# Patient Record
Sex: Male | Born: 1940 | Race: White | Hispanic: No | Marital: Married | Smoking: Former smoker
Health system: Southern US, Community
[De-identification: ages and names within clinical notes are randomized; demographics above are authoritative.]

## PROBLEM LIST (undated history)

## (undated) DIAGNOSIS — I25810 Atherosclerosis of coronary artery bypass graft(s) without angina pectoris: Secondary | ICD-10-CM

## (undated) DIAGNOSIS — I219 Acute myocardial infarction, unspecified: Secondary | ICD-10-CM

## (undated) DIAGNOSIS — I10 Essential (primary) hypertension: Secondary | ICD-10-CM

## (undated) DIAGNOSIS — T7840XA Allergy, unspecified, initial encounter: Secondary | ICD-10-CM

## (undated) DIAGNOSIS — M1990 Unspecified osteoarthritis, unspecified site: Secondary | ICD-10-CM

## (undated) DIAGNOSIS — R0902 Hypoxemia: Secondary | ICD-10-CM

## (undated) DIAGNOSIS — E785 Hyperlipidemia, unspecified: Secondary | ICD-10-CM

## (undated) DIAGNOSIS — J439 Emphysema, unspecified: Secondary | ICD-10-CM

## (undated) DIAGNOSIS — E119 Type 2 diabetes mellitus without complications: Secondary | ICD-10-CM

## (undated) DIAGNOSIS — J449 Chronic obstructive pulmonary disease, unspecified: Secondary | ICD-10-CM

## (undated) DIAGNOSIS — K219 Gastro-esophageal reflux disease without esophagitis: Secondary | ICD-10-CM

## (undated) DIAGNOSIS — Z933 Colostomy status: Secondary | ICD-10-CM

## (undated) DIAGNOSIS — J45909 Unspecified asthma, uncomplicated: Secondary | ICD-10-CM

## (undated) HISTORY — PX: COLOSTOMY CLOSURE: SHX1381

## (undated) HISTORY — DX: Unspecified asthma, uncomplicated: J45.909

## (undated) HISTORY — DX: Emphysema, unspecified: J43.9

## (undated) HISTORY — DX: Hypoxemia: R09.02

## (undated) HISTORY — DX: Hyperlipidemia, unspecified: E78.5

## (undated) HISTORY — DX: Type 2 diabetes mellitus without complications: E11.9

## (undated) HISTORY — DX: Atherosclerosis of coronary artery bypass graft(s) without angina pectoris: I25.810

## (undated) HISTORY — DX: Essential (primary) hypertension: I10

## (undated) HISTORY — PX: KNEE SURGERY: SHX244

## (undated) HISTORY — DX: Unspecified osteoarthritis, unspecified site: M19.90

## (undated) HISTORY — DX: Gastro-esophageal reflux disease without esophagitis: K21.9

## (undated) HISTORY — DX: Allergy, unspecified, initial encounter: T78.40XA

## (undated) HISTORY — PX: HERNIA REPAIR: SHX51

## (undated) HISTORY — PX: OTHER SURGICAL HISTORY: SHX169

## (undated) HISTORY — PX: NOSE SURGERY: SHX723

## (undated) HISTORY — PX: BACK SURGERY: SHX140

## (undated) HISTORY — PX: COLOSTOMY: SHX63

---

## 2011-07-06 DIAGNOSIS — M7989 Other specified soft tissue disorders: Secondary | ICD-10-CM | POA: Diagnosis not present

## 2011-07-06 DIAGNOSIS — Z87891 Personal history of nicotine dependence: Secondary | ICD-10-CM | POA: Diagnosis not present

## 2011-07-06 DIAGNOSIS — E119 Type 2 diabetes mellitus without complications: Secondary | ICD-10-CM | POA: Diagnosis not present

## 2011-07-06 DIAGNOSIS — M25519 Pain in unspecified shoulder: Secondary | ICD-10-CM | POA: Diagnosis not present

## 2011-07-06 DIAGNOSIS — I10 Essential (primary) hypertension: Secondary | ICD-10-CM | POA: Diagnosis not present

## 2011-07-06 DIAGNOSIS — M79609 Pain in unspecified limb: Secondary | ICD-10-CM | POA: Diagnosis not present

## 2011-07-06 DIAGNOSIS — Z9861 Coronary angioplasty status: Secondary | ICD-10-CM | POA: Diagnosis not present

## 2011-07-06 DIAGNOSIS — B029 Zoster without complications: Secondary | ICD-10-CM | POA: Diagnosis not present

## 2011-07-06 DIAGNOSIS — I252 Old myocardial infarction: Secondary | ICD-10-CM | POA: Diagnosis not present

## 2011-07-06 DIAGNOSIS — J449 Chronic obstructive pulmonary disease, unspecified: Secondary | ICD-10-CM | POA: Diagnosis not present

## 2011-07-06 DIAGNOSIS — M129 Arthropathy, unspecified: Secondary | ICD-10-CM | POA: Diagnosis not present

## 2011-07-06 DIAGNOSIS — F329 Major depressive disorder, single episode, unspecified: Secondary | ICD-10-CM | POA: Diagnosis not present

## 2011-07-06 DIAGNOSIS — K219 Gastro-esophageal reflux disease without esophagitis: Secondary | ICD-10-CM | POA: Diagnosis not present

## 2011-07-06 DIAGNOSIS — G8929 Other chronic pain: Secondary | ICD-10-CM | POA: Diagnosis not present

## 2011-07-08 DIAGNOSIS — M719 Bursopathy, unspecified: Secondary | ICD-10-CM | POA: Diagnosis not present

## 2011-07-08 DIAGNOSIS — M67919 Unspecified disorder of synovium and tendon, unspecified shoulder: Secondary | ICD-10-CM | POA: Diagnosis not present

## 2011-07-08 DIAGNOSIS — M654 Radial styloid tenosynovitis [de Quervain]: Secondary | ICD-10-CM | POA: Diagnosis not present

## 2011-07-08 DIAGNOSIS — M1710 Unilateral primary osteoarthritis, unspecified knee: Secondary | ICD-10-CM | POA: Diagnosis not present

## 2011-07-13 DIAGNOSIS — G8929 Other chronic pain: Secondary | ICD-10-CM | POA: Diagnosis not present

## 2011-07-13 DIAGNOSIS — M25569 Pain in unspecified knee: Secondary | ICD-10-CM | POA: Diagnosis not present

## 2011-07-14 DIAGNOSIS — IMO0001 Reserved for inherently not codable concepts without codable children: Secondary | ICD-10-CM | POA: Diagnosis not present

## 2011-07-14 DIAGNOSIS — M545 Low back pain: Secondary | ICD-10-CM | POA: Diagnosis not present

## 2011-07-14 DIAGNOSIS — G894 Chronic pain syndrome: Secondary | ICD-10-CM | POA: Diagnosis not present

## 2011-07-14 DIAGNOSIS — IMO0002 Reserved for concepts with insufficient information to code with codable children: Secondary | ICD-10-CM | POA: Diagnosis not present

## 2011-07-17 DIAGNOSIS — J069 Acute upper respiratory infection, unspecified: Secondary | ICD-10-CM | POA: Diagnosis not present

## 2011-07-17 DIAGNOSIS — E119 Type 2 diabetes mellitus without complications: Secondary | ICD-10-CM | POA: Diagnosis not present

## 2011-07-17 DIAGNOSIS — K219 Gastro-esophageal reflux disease without esophagitis: Secondary | ICD-10-CM | POA: Diagnosis not present

## 2011-07-17 DIAGNOSIS — Z9861 Coronary angioplasty status: Secondary | ICD-10-CM | POA: Diagnosis not present

## 2011-07-17 DIAGNOSIS — R0602 Shortness of breath: Secondary | ICD-10-CM | POA: Diagnosis not present

## 2011-07-17 DIAGNOSIS — I252 Old myocardial infarction: Secondary | ICD-10-CM | POA: Diagnosis not present

## 2011-07-17 DIAGNOSIS — G8929 Other chronic pain: Secondary | ICD-10-CM | POA: Diagnosis not present

## 2011-07-17 DIAGNOSIS — J449 Chronic obstructive pulmonary disease, unspecified: Secondary | ICD-10-CM | POA: Diagnosis not present

## 2011-07-17 DIAGNOSIS — Z87891 Personal history of nicotine dependence: Secondary | ICD-10-CM | POA: Diagnosis not present

## 2011-07-17 DIAGNOSIS — R42 Dizziness and giddiness: Secondary | ICD-10-CM | POA: Diagnosis not present

## 2011-07-17 DIAGNOSIS — I491 Atrial premature depolarization: Secondary | ICD-10-CM | POA: Diagnosis not present

## 2011-07-17 DIAGNOSIS — R5381 Other malaise: Secondary | ICD-10-CM | POA: Diagnosis not present

## 2011-07-17 DIAGNOSIS — I10 Essential (primary) hypertension: Secondary | ICD-10-CM | POA: Diagnosis not present

## 2011-07-17 DIAGNOSIS — R9431 Abnormal electrocardiogram [ECG] [EKG]: Secondary | ICD-10-CM | POA: Diagnosis not present

## 2011-07-22 DIAGNOSIS — M1710 Unilateral primary osteoarthritis, unspecified knee: Secondary | ICD-10-CM | POA: Diagnosis not present

## 2011-07-24 DIAGNOSIS — J384 Edema of larynx: Secondary | ICD-10-CM | POA: Diagnosis not present

## 2011-07-24 DIAGNOSIS — H6120 Impacted cerumen, unspecified ear: Secondary | ICD-10-CM | POA: Diagnosis not present

## 2011-07-29 DIAGNOSIS — G5620 Lesion of ulnar nerve, unspecified upper limb: Secondary | ICD-10-CM | POA: Diagnosis not present

## 2011-08-03 DIAGNOSIS — G8929 Other chronic pain: Secondary | ICD-10-CM | POA: Diagnosis not present

## 2011-08-03 DIAGNOSIS — R9431 Abnormal electrocardiogram [ECG] [EKG]: Secondary | ICD-10-CM | POA: Diagnosis not present

## 2011-08-03 DIAGNOSIS — K219 Gastro-esophageal reflux disease without esophagitis: Secondary | ICD-10-CM | POA: Diagnosis not present

## 2011-08-03 DIAGNOSIS — E119 Type 2 diabetes mellitus without complications: Secondary | ICD-10-CM | POA: Diagnosis not present

## 2011-08-03 DIAGNOSIS — Z9861 Coronary angioplasty status: Secondary | ICD-10-CM | POA: Diagnosis not present

## 2011-08-03 DIAGNOSIS — I10 Essential (primary) hypertension: Secondary | ICD-10-CM | POA: Diagnosis not present

## 2011-08-03 DIAGNOSIS — J441 Chronic obstructive pulmonary disease with (acute) exacerbation: Secondary | ICD-10-CM | POA: Diagnosis not present

## 2011-08-03 DIAGNOSIS — Z794 Long term (current) use of insulin: Secondary | ICD-10-CM | POA: Diagnosis not present

## 2011-08-03 DIAGNOSIS — F329 Major depressive disorder, single episode, unspecified: Secondary | ICD-10-CM | POA: Diagnosis not present

## 2011-08-03 DIAGNOSIS — J449 Chronic obstructive pulmonary disease, unspecified: Secondary | ICD-10-CM | POA: Diagnosis not present

## 2011-08-03 DIAGNOSIS — Z87891 Personal history of nicotine dependence: Secondary | ICD-10-CM | POA: Diagnosis not present

## 2011-08-03 DIAGNOSIS — J45901 Unspecified asthma with (acute) exacerbation: Secondary | ICD-10-CM | POA: Diagnosis not present

## 2011-08-03 DIAGNOSIS — I4460 Unspecified fascicular block: Secondary | ICD-10-CM | POA: Diagnosis not present

## 2011-08-03 DIAGNOSIS — I252 Old myocardial infarction: Secondary | ICD-10-CM | POA: Diagnosis not present

## 2011-08-03 DIAGNOSIS — I491 Atrial premature depolarization: Secondary | ICD-10-CM | POA: Diagnosis not present

## 2011-08-03 DIAGNOSIS — R0989 Other specified symptoms and signs involving the circulatory and respiratory systems: Secondary | ICD-10-CM | POA: Diagnosis not present

## 2011-08-03 DIAGNOSIS — R42 Dizziness and giddiness: Secondary | ICD-10-CM | POA: Diagnosis not present

## 2011-08-03 DIAGNOSIS — R0602 Shortness of breath: Secondary | ICD-10-CM | POA: Diagnosis not present

## 2011-08-05 DIAGNOSIS — G5620 Lesion of ulnar nerve, unspecified upper limb: Secondary | ICD-10-CM | POA: Diagnosis not present

## 2011-08-09 DIAGNOSIS — I252 Old myocardial infarction: Secondary | ICD-10-CM | POA: Diagnosis not present

## 2011-08-09 DIAGNOSIS — E119 Type 2 diabetes mellitus without complications: Secondary | ICD-10-CM | POA: Diagnosis not present

## 2011-08-09 DIAGNOSIS — G8929 Other chronic pain: Secondary | ICD-10-CM | POA: Diagnosis not present

## 2011-08-09 DIAGNOSIS — L03119 Cellulitis of unspecified part of limb: Secondary | ICD-10-CM | POA: Diagnosis not present

## 2011-08-09 DIAGNOSIS — T8140XA Infection following a procedure, unspecified, initial encounter: Secondary | ICD-10-CM | POA: Diagnosis not present

## 2011-08-09 DIAGNOSIS — IMO0002 Reserved for concepts with insufficient information to code with codable children: Secondary | ICD-10-CM | POA: Diagnosis not present

## 2011-08-09 DIAGNOSIS — G8918 Other acute postprocedural pain: Secondary | ICD-10-CM | POA: Diagnosis not present

## 2011-08-09 DIAGNOSIS — L02519 Cutaneous abscess of unspecified hand: Secondary | ICD-10-CM | POA: Diagnosis not present

## 2011-08-09 DIAGNOSIS — I10 Essential (primary) hypertension: Secondary | ICD-10-CM | POA: Diagnosis not present

## 2011-08-09 DIAGNOSIS — Z9889 Other specified postprocedural states: Secondary | ICD-10-CM | POA: Diagnosis not present

## 2011-08-09 DIAGNOSIS — R0602 Shortness of breath: Secondary | ICD-10-CM | POA: Diagnosis not present

## 2011-08-09 DIAGNOSIS — K219 Gastro-esophageal reflux disease without esophagitis: Secondary | ICD-10-CM | POA: Diagnosis not present

## 2011-08-09 DIAGNOSIS — Z87891 Personal history of nicotine dependence: Secondary | ICD-10-CM | POA: Diagnosis not present

## 2011-08-09 DIAGNOSIS — M79609 Pain in unspecified limb: Secondary | ICD-10-CM | POA: Diagnosis not present

## 2011-08-09 DIAGNOSIS — J449 Chronic obstructive pulmonary disease, unspecified: Secondary | ICD-10-CM | POA: Diagnosis not present

## 2011-08-12 DIAGNOSIS — G894 Chronic pain syndrome: Secondary | ICD-10-CM | POA: Diagnosis not present

## 2011-08-12 DIAGNOSIS — M961 Postlaminectomy syndrome, not elsewhere classified: Secondary | ICD-10-CM | POA: Diagnosis not present

## 2011-08-12 DIAGNOSIS — IMO0002 Reserved for concepts with insufficient information to code with codable children: Secondary | ICD-10-CM | POA: Diagnosis not present

## 2011-08-12 DIAGNOSIS — IMO0001 Reserved for inherently not codable concepts without codable children: Secondary | ICD-10-CM | POA: Diagnosis not present

## 2011-08-13 DIAGNOSIS — J441 Chronic obstructive pulmonary disease with (acute) exacerbation: Secondary | ICD-10-CM | POA: Diagnosis not present

## 2011-08-13 DIAGNOSIS — I4460 Unspecified fascicular block: Secondary | ICD-10-CM | POA: Diagnosis not present

## 2011-08-13 DIAGNOSIS — Z87891 Personal history of nicotine dependence: Secondary | ICD-10-CM | POA: Diagnosis not present

## 2011-08-13 DIAGNOSIS — R9431 Abnormal electrocardiogram [ECG] [EKG]: Secondary | ICD-10-CM | POA: Diagnosis not present

## 2011-08-13 DIAGNOSIS — I498 Other specified cardiac arrhythmias: Secondary | ICD-10-CM | POA: Diagnosis not present

## 2011-08-13 DIAGNOSIS — I491 Atrial premature depolarization: Secondary | ICD-10-CM | POA: Diagnosis not present

## 2011-08-13 DIAGNOSIS — I10 Essential (primary) hypertension: Secondary | ICD-10-CM | POA: Diagnosis not present

## 2011-08-13 DIAGNOSIS — I252 Old myocardial infarction: Secondary | ICD-10-CM | POA: Diagnosis not present

## 2011-08-13 DIAGNOSIS — R0609 Other forms of dyspnea: Secondary | ICD-10-CM | POA: Diagnosis not present

## 2011-08-13 DIAGNOSIS — Z9861 Coronary angioplasty status: Secondary | ICD-10-CM | POA: Diagnosis not present

## 2011-08-13 DIAGNOSIS — E119 Type 2 diabetes mellitus without complications: Secondary | ICD-10-CM | POA: Diagnosis not present

## 2011-08-13 DIAGNOSIS — J45901 Unspecified asthma with (acute) exacerbation: Secondary | ICD-10-CM | POA: Diagnosis not present

## 2011-08-20 DIAGNOSIS — J45901 Unspecified asthma with (acute) exacerbation: Secondary | ICD-10-CM | POA: Diagnosis not present

## 2011-08-20 DIAGNOSIS — E119 Type 2 diabetes mellitus without complications: Secondary | ICD-10-CM | POA: Diagnosis not present

## 2011-08-20 DIAGNOSIS — R0602 Shortness of breath: Secondary | ICD-10-CM | POA: Diagnosis not present

## 2011-08-20 DIAGNOSIS — R9431 Abnormal electrocardiogram [ECG] [EKG]: Secondary | ICD-10-CM | POA: Diagnosis not present

## 2011-08-20 DIAGNOSIS — K219 Gastro-esophageal reflux disease without esophagitis: Secondary | ICD-10-CM | POA: Diagnosis not present

## 2011-08-20 DIAGNOSIS — I252 Old myocardial infarction: Secondary | ICD-10-CM | POA: Diagnosis not present

## 2011-08-20 DIAGNOSIS — I498 Other specified cardiac arrhythmias: Secondary | ICD-10-CM | POA: Diagnosis not present

## 2011-08-20 DIAGNOSIS — I10 Essential (primary) hypertension: Secondary | ICD-10-CM | POA: Diagnosis not present

## 2011-08-20 DIAGNOSIS — R0609 Other forms of dyspnea: Secondary | ICD-10-CM | POA: Diagnosis not present

## 2011-08-20 DIAGNOSIS — Z87891 Personal history of nicotine dependence: Secondary | ICD-10-CM | POA: Diagnosis not present

## 2011-08-20 DIAGNOSIS — Z9861 Coronary angioplasty status: Secondary | ICD-10-CM | POA: Diagnosis not present

## 2011-08-20 DIAGNOSIS — J441 Chronic obstructive pulmonary disease with (acute) exacerbation: Secondary | ICD-10-CM | POA: Diagnosis not present

## 2011-08-31 DIAGNOSIS — I10 Essential (primary) hypertension: Secondary | ICD-10-CM | POA: Diagnosis not present

## 2011-08-31 DIAGNOSIS — I252 Old myocardial infarction: Secondary | ICD-10-CM | POA: Diagnosis not present

## 2011-08-31 DIAGNOSIS — K219 Gastro-esophageal reflux disease without esophagitis: Secondary | ICD-10-CM | POA: Diagnosis not present

## 2011-08-31 DIAGNOSIS — G8929 Other chronic pain: Secondary | ICD-10-CM | POA: Diagnosis not present

## 2011-08-31 DIAGNOSIS — J449 Chronic obstructive pulmonary disease, unspecified: Secondary | ICD-10-CM | POA: Diagnosis not present

## 2011-08-31 DIAGNOSIS — R9431 Abnormal electrocardiogram [ECG] [EKG]: Secondary | ICD-10-CM | POA: Diagnosis not present

## 2011-08-31 DIAGNOSIS — Z9861 Coronary angioplasty status: Secondary | ICD-10-CM | POA: Diagnosis not present

## 2011-08-31 DIAGNOSIS — R42 Dizziness and giddiness: Secondary | ICD-10-CM | POA: Diagnosis not present

## 2011-08-31 DIAGNOSIS — I4949 Other premature depolarization: Secondary | ICD-10-CM | POA: Diagnosis not present

## 2011-08-31 DIAGNOSIS — Z87891 Personal history of nicotine dependence: Secondary | ICD-10-CM | POA: Diagnosis not present

## 2011-08-31 DIAGNOSIS — M549 Dorsalgia, unspecified: Secondary | ICD-10-CM | POA: Diagnosis not present

## 2011-08-31 DIAGNOSIS — E119 Type 2 diabetes mellitus without complications: Secondary | ICD-10-CM | POA: Diagnosis not present

## 2011-09-05 DIAGNOSIS — I10 Essential (primary) hypertension: Secondary | ICD-10-CM | POA: Diagnosis not present

## 2011-09-05 DIAGNOSIS — M25559 Pain in unspecified hip: Secondary | ICD-10-CM | POA: Diagnosis not present

## 2011-09-05 DIAGNOSIS — I252 Old myocardial infarction: Secondary | ICD-10-CM | POA: Diagnosis not present

## 2011-09-05 DIAGNOSIS — R9431 Abnormal electrocardiogram [ECG] [EKG]: Secondary | ICD-10-CM | POA: Diagnosis not present

## 2011-09-05 DIAGNOSIS — Z79899 Other long term (current) drug therapy: Secondary | ICD-10-CM | POA: Diagnosis not present

## 2011-09-05 DIAGNOSIS — Z7982 Long term (current) use of aspirin: Secondary | ICD-10-CM | POA: Diagnosis not present

## 2011-09-05 DIAGNOSIS — M25569 Pain in unspecified knee: Secondary | ICD-10-CM | POA: Diagnosis not present

## 2011-09-05 DIAGNOSIS — J449 Chronic obstructive pulmonary disease, unspecified: Secondary | ICD-10-CM | POA: Diagnosis not present

## 2011-09-05 DIAGNOSIS — R42 Dizziness and giddiness: Secondary | ICD-10-CM | POA: Diagnosis not present

## 2011-09-05 DIAGNOSIS — E119 Type 2 diabetes mellitus without complications: Secondary | ICD-10-CM | POA: Diagnosis not present

## 2011-09-05 DIAGNOSIS — Z9861 Coronary angioplasty status: Secondary | ICD-10-CM | POA: Diagnosis not present

## 2011-09-05 DIAGNOSIS — F329 Major depressive disorder, single episode, unspecified: Secondary | ICD-10-CM | POA: Diagnosis not present

## 2011-09-05 DIAGNOSIS — K219 Gastro-esophageal reflux disease without esophagitis: Secondary | ICD-10-CM | POA: Diagnosis not present

## 2011-09-05 DIAGNOSIS — I4949 Other premature depolarization: Secondary | ICD-10-CM | POA: Diagnosis not present

## 2011-09-05 DIAGNOSIS — I491 Atrial premature depolarization: Secondary | ICD-10-CM | POA: Diagnosis not present

## 2011-09-05 DIAGNOSIS — G8929 Other chronic pain: Secondary | ICD-10-CM | POA: Diagnosis not present

## 2011-09-05 DIAGNOSIS — D649 Anemia, unspecified: Secondary | ICD-10-CM | POA: Diagnosis not present

## 2011-09-05 DIAGNOSIS — Z87891 Personal history of nicotine dependence: Secondary | ICD-10-CM | POA: Diagnosis not present

## 2011-09-06 DIAGNOSIS — E119 Type 2 diabetes mellitus without complications: Secondary | ICD-10-CM | POA: Diagnosis not present

## 2011-09-06 DIAGNOSIS — J449 Chronic obstructive pulmonary disease, unspecified: Secondary | ICD-10-CM | POA: Diagnosis not present

## 2011-09-06 DIAGNOSIS — Z951 Presence of aortocoronary bypass graft: Secondary | ICD-10-CM | POA: Diagnosis not present

## 2011-09-06 DIAGNOSIS — Z87891 Personal history of nicotine dependence: Secondary | ICD-10-CM | POA: Diagnosis not present

## 2011-09-06 DIAGNOSIS — I2510 Atherosclerotic heart disease of native coronary artery without angina pectoris: Secondary | ICD-10-CM | POA: Diagnosis not present

## 2011-09-06 DIAGNOSIS — K5289 Other specified noninfective gastroenteritis and colitis: Secondary | ICD-10-CM | POA: Diagnosis not present

## 2011-09-06 DIAGNOSIS — R404 Transient alteration of awareness: Secondary | ICD-10-CM | POA: Diagnosis not present

## 2011-09-06 DIAGNOSIS — E860 Dehydration: Secondary | ICD-10-CM | POA: Diagnosis not present

## 2011-09-06 DIAGNOSIS — Z9119 Patient's noncompliance with other medical treatment and regimen: Secondary | ICD-10-CM | POA: Diagnosis not present

## 2011-09-06 DIAGNOSIS — M549 Dorsalgia, unspecified: Secondary | ICD-10-CM | POA: Diagnosis not present

## 2011-09-06 DIAGNOSIS — I10 Essential (primary) hypertension: Secondary | ICD-10-CM | POA: Diagnosis not present

## 2011-09-06 DIAGNOSIS — R112 Nausea with vomiting, unspecified: Secondary | ICD-10-CM | POA: Diagnosis not present

## 2011-09-06 DIAGNOSIS — I498 Other specified cardiac arrhythmias: Secondary | ICD-10-CM | POA: Diagnosis not present

## 2011-09-06 DIAGNOSIS — G8929 Other chronic pain: Secondary | ICD-10-CM | POA: Diagnosis not present

## 2011-09-06 DIAGNOSIS — R197 Diarrhea, unspecified: Secondary | ICD-10-CM | POA: Diagnosis not present

## 2011-09-06 DIAGNOSIS — R0602 Shortness of breath: Secondary | ICD-10-CM | POA: Diagnosis not present

## 2011-09-06 DIAGNOSIS — I491 Atrial premature depolarization: Secondary | ICD-10-CM | POA: Diagnosis not present

## 2011-09-06 DIAGNOSIS — D649 Anemia, unspecified: Secondary | ICD-10-CM | POA: Diagnosis not present

## 2011-09-06 DIAGNOSIS — R9431 Abnormal electrocardiogram [ECG] [EKG]: Secondary | ICD-10-CM | POA: Diagnosis not present

## 2011-09-06 DIAGNOSIS — M1990 Unspecified osteoarthritis, unspecified site: Secondary | ICD-10-CM | POA: Diagnosis not present

## 2011-09-07 DIAGNOSIS — I10 Essential (primary) hypertension: Secondary | ICD-10-CM | POA: Diagnosis not present

## 2011-09-07 DIAGNOSIS — A088 Other specified intestinal infections: Secondary | ICD-10-CM | POA: Diagnosis not present

## 2011-09-07 DIAGNOSIS — D518 Other vitamin B12 deficiency anemias: Secondary | ICD-10-CM | POA: Diagnosis not present

## 2011-09-07 DIAGNOSIS — J449 Chronic obstructive pulmonary disease, unspecified: Secondary | ICD-10-CM | POA: Diagnosis not present

## 2011-09-09 DIAGNOSIS — M961 Postlaminectomy syndrome, not elsewhere classified: Secondary | ICD-10-CM | POA: Diagnosis not present

## 2011-09-09 DIAGNOSIS — M25519 Pain in unspecified shoulder: Secondary | ICD-10-CM | POA: Diagnosis not present

## 2011-09-09 DIAGNOSIS — IMO0002 Reserved for concepts with insufficient information to code with codable children: Secondary | ICD-10-CM | POA: Diagnosis not present

## 2011-09-09 DIAGNOSIS — M76899 Other specified enthesopathies of unspecified lower limb, excluding foot: Secondary | ICD-10-CM | POA: Diagnosis not present

## 2011-09-09 DIAGNOSIS — G894 Chronic pain syndrome: Secondary | ICD-10-CM | POA: Diagnosis not present

## 2011-09-16 DIAGNOSIS — M5430 Sciatica, unspecified side: Secondary | ICD-10-CM | POA: Diagnosis not present

## 2011-09-16 DIAGNOSIS — J441 Chronic obstructive pulmonary disease with (acute) exacerbation: Secondary | ICD-10-CM | POA: Diagnosis not present

## 2011-09-16 DIAGNOSIS — R05 Cough: Secondary | ICD-10-CM | POA: Diagnosis not present

## 2011-09-16 DIAGNOSIS — R0602 Shortness of breath: Secondary | ICD-10-CM | POA: Diagnosis not present

## 2011-09-18 DIAGNOSIS — J209 Acute bronchitis, unspecified: Secondary | ICD-10-CM | POA: Diagnosis not present

## 2011-09-18 DIAGNOSIS — M25569 Pain in unspecified knee: Secondary | ICD-10-CM | POA: Diagnosis not present

## 2011-09-18 DIAGNOSIS — J029 Acute pharyngitis, unspecified: Secondary | ICD-10-CM | POA: Diagnosis not present

## 2011-09-18 DIAGNOSIS — M25559 Pain in unspecified hip: Secondary | ICD-10-CM | POA: Diagnosis not present

## 2011-09-21 DIAGNOSIS — R1319 Other dysphagia: Secondary | ICD-10-CM | POA: Diagnosis not present

## 2011-09-21 DIAGNOSIS — D508 Other iron deficiency anemias: Secondary | ICD-10-CM | POA: Diagnosis not present

## 2011-09-23 DIAGNOSIS — M1710 Unilateral primary osteoarthritis, unspecified knee: Secondary | ICD-10-CM | POA: Diagnosis not present

## 2011-09-28 DIAGNOSIS — R0602 Shortness of breath: Secondary | ICD-10-CM | POA: Diagnosis not present

## 2011-09-28 DIAGNOSIS — J189 Pneumonia, unspecified organism: Secondary | ICD-10-CM | POA: Diagnosis not present

## 2011-09-29 DIAGNOSIS — R0602 Shortness of breath: Secondary | ICD-10-CM | POA: Diagnosis not present

## 2011-09-29 DIAGNOSIS — J189 Pneumonia, unspecified organism: Secondary | ICD-10-CM | POA: Diagnosis not present

## 2011-09-30 DIAGNOSIS — R0602 Shortness of breath: Secondary | ICD-10-CM | POA: Diagnosis not present

## 2011-09-30 DIAGNOSIS — J189 Pneumonia, unspecified organism: Secondary | ICD-10-CM | POA: Diagnosis not present

## 2011-10-05 DIAGNOSIS — Z87891 Personal history of nicotine dependence: Secondary | ICD-10-CM | POA: Diagnosis not present

## 2011-10-05 DIAGNOSIS — M549 Dorsalgia, unspecified: Secondary | ICD-10-CM | POA: Diagnosis not present

## 2011-10-05 DIAGNOSIS — G8929 Other chronic pain: Secondary | ICD-10-CM | POA: Diagnosis not present

## 2011-10-05 DIAGNOSIS — R52 Pain, unspecified: Secondary | ICD-10-CM | POA: Diagnosis not present

## 2011-10-05 DIAGNOSIS — M545 Low back pain: Secondary | ICD-10-CM | POA: Diagnosis not present

## 2011-10-05 DIAGNOSIS — M79609 Pain in unspecified limb: Secondary | ICD-10-CM | POA: Diagnosis not present

## 2011-10-07 DIAGNOSIS — G894 Chronic pain syndrome: Secondary | ICD-10-CM | POA: Diagnosis not present

## 2011-10-07 DIAGNOSIS — IMO0002 Reserved for concepts with insufficient information to code with codable children: Secondary | ICD-10-CM | POA: Diagnosis not present

## 2011-10-07 DIAGNOSIS — M25519 Pain in unspecified shoulder: Secondary | ICD-10-CM | POA: Diagnosis not present

## 2011-10-07 DIAGNOSIS — M961 Postlaminectomy syndrome, not elsewhere classified: Secondary | ICD-10-CM | POA: Diagnosis not present

## 2011-10-16 DIAGNOSIS — M542 Cervicalgia: Secondary | ICD-10-CM | POA: Diagnosis not present

## 2011-10-16 DIAGNOSIS — J45909 Unspecified asthma, uncomplicated: Secondary | ICD-10-CM | POA: Diagnosis not present

## 2011-10-16 DIAGNOSIS — I252 Old myocardial infarction: Secondary | ICD-10-CM | POA: Diagnosis not present

## 2011-10-16 DIAGNOSIS — Z794 Long term (current) use of insulin: Secondary | ICD-10-CM | POA: Diagnosis not present

## 2011-10-16 DIAGNOSIS — G8929 Other chronic pain: Secondary | ICD-10-CM | POA: Diagnosis not present

## 2011-10-16 DIAGNOSIS — E119 Type 2 diabetes mellitus without complications: Secondary | ICD-10-CM | POA: Diagnosis not present

## 2011-10-16 DIAGNOSIS — Z9861 Coronary angioplasty status: Secondary | ICD-10-CM | POA: Diagnosis not present

## 2011-10-16 DIAGNOSIS — M25559 Pain in unspecified hip: Secondary | ICD-10-CM | POA: Diagnosis not present

## 2011-10-16 DIAGNOSIS — J441 Chronic obstructive pulmonary disease with (acute) exacerbation: Secondary | ICD-10-CM | POA: Diagnosis not present

## 2011-10-16 DIAGNOSIS — Z7982 Long term (current) use of aspirin: Secondary | ICD-10-CM | POA: Diagnosis not present

## 2011-10-16 DIAGNOSIS — Z87891 Personal history of nicotine dependence: Secondary | ICD-10-CM | POA: Diagnosis not present

## 2011-10-21 DIAGNOSIS — M76899 Other specified enthesopathies of unspecified lower limb, excluding foot: Secondary | ICD-10-CM | POA: Diagnosis not present

## 2011-10-21 DIAGNOSIS — M1710 Unilateral primary osteoarthritis, unspecified knee: Secondary | ICD-10-CM | POA: Diagnosis not present

## 2011-10-26 DIAGNOSIS — R0609 Other forms of dyspnea: Secondary | ICD-10-CM | POA: Diagnosis not present

## 2011-10-26 DIAGNOSIS — R9431 Abnormal electrocardiogram [ECG] [EKG]: Secondary | ICD-10-CM | POA: Diagnosis not present

## 2011-10-26 DIAGNOSIS — I498 Other specified cardiac arrhythmias: Secondary | ICD-10-CM | POA: Diagnosis not present

## 2011-10-26 DIAGNOSIS — R0602 Shortness of breath: Secondary | ICD-10-CM | POA: Diagnosis not present

## 2011-10-26 DIAGNOSIS — I4460 Unspecified fascicular block: Secondary | ICD-10-CM | POA: Diagnosis not present

## 2011-10-26 DIAGNOSIS — J449 Chronic obstructive pulmonary disease, unspecified: Secondary | ICD-10-CM | POA: Diagnosis not present

## 2011-10-26 DIAGNOSIS — J441 Chronic obstructive pulmonary disease with (acute) exacerbation: Secondary | ICD-10-CM | POA: Diagnosis not present

## 2011-10-28 DIAGNOSIS — D72829 Elevated white blood cell count, unspecified: Secondary | ICD-10-CM | POA: Diagnosis not present

## 2011-10-28 DIAGNOSIS — R0602 Shortness of breath: Secondary | ICD-10-CM | POA: Diagnosis not present

## 2011-10-28 DIAGNOSIS — J984 Other disorders of lung: Secondary | ICD-10-CM | POA: Diagnosis not present

## 2011-10-28 DIAGNOSIS — J449 Chronic obstructive pulmonary disease, unspecified: Secondary | ICD-10-CM | POA: Diagnosis not present

## 2011-10-28 DIAGNOSIS — D649 Anemia, unspecified: Secondary | ICD-10-CM | POA: Diagnosis not present

## 2011-10-28 DIAGNOSIS — I259 Chronic ischemic heart disease, unspecified: Secondary | ICD-10-CM | POA: Diagnosis not present

## 2011-10-28 DIAGNOSIS — J189 Pneumonia, unspecified organism: Secondary | ICD-10-CM | POA: Diagnosis not present

## 2011-10-28 DIAGNOSIS — E119 Type 2 diabetes mellitus without complications: Secondary | ICD-10-CM | POA: Diagnosis not present

## 2011-10-29 DIAGNOSIS — I369 Nonrheumatic tricuspid valve disorder, unspecified: Secondary | ICD-10-CM | POA: Diagnosis not present

## 2011-10-29 DIAGNOSIS — D508 Other iron deficiency anemias: Secondary | ICD-10-CM | POA: Diagnosis not present

## 2011-10-29 DIAGNOSIS — E119 Type 2 diabetes mellitus without complications: Secondary | ICD-10-CM | POA: Diagnosis not present

## 2011-10-29 DIAGNOSIS — J449 Chronic obstructive pulmonary disease, unspecified: Secondary | ICD-10-CM | POA: Diagnosis not present

## 2011-10-29 DIAGNOSIS — I059 Rheumatic mitral valve disease, unspecified: Secondary | ICD-10-CM | POA: Diagnosis not present

## 2011-10-29 DIAGNOSIS — I259 Chronic ischemic heart disease, unspecified: Secondary | ICD-10-CM | POA: Diagnosis not present

## 2011-10-29 DIAGNOSIS — J189 Pneumonia, unspecified organism: Secondary | ICD-10-CM | POA: Diagnosis not present

## 2011-10-29 DIAGNOSIS — I359 Nonrheumatic aortic valve disorder, unspecified: Secondary | ICD-10-CM | POA: Diagnosis not present

## 2011-10-29 DIAGNOSIS — K2270 Barrett's esophagus without dysplasia: Secondary | ICD-10-CM | POA: Diagnosis not present

## 2011-10-30 DIAGNOSIS — I259 Chronic ischemic heart disease, unspecified: Secondary | ICD-10-CM | POA: Diagnosis not present

## 2011-10-30 DIAGNOSIS — J449 Chronic obstructive pulmonary disease, unspecified: Secondary | ICD-10-CM | POA: Diagnosis not present

## 2011-10-30 DIAGNOSIS — J189 Pneumonia, unspecified organism: Secondary | ICD-10-CM | POA: Diagnosis not present

## 2011-10-30 DIAGNOSIS — E119 Type 2 diabetes mellitus without complications: Secondary | ICD-10-CM | POA: Diagnosis not present

## 2011-10-30 DIAGNOSIS — D508 Other iron deficiency anemias: Secondary | ICD-10-CM | POA: Diagnosis not present

## 2011-10-31 DIAGNOSIS — J449 Chronic obstructive pulmonary disease, unspecified: Secondary | ICD-10-CM | POA: Diagnosis not present

## 2011-10-31 DIAGNOSIS — I259 Chronic ischemic heart disease, unspecified: Secondary | ICD-10-CM | POA: Diagnosis not present

## 2011-10-31 DIAGNOSIS — J189 Pneumonia, unspecified organism: Secondary | ICD-10-CM | POA: Diagnosis not present

## 2011-10-31 DIAGNOSIS — E119 Type 2 diabetes mellitus without complications: Secondary | ICD-10-CM | POA: Diagnosis not present

## 2011-11-01 DIAGNOSIS — J189 Pneumonia, unspecified organism: Secondary | ICD-10-CM | POA: Diagnosis not present

## 2011-11-01 DIAGNOSIS — E119 Type 2 diabetes mellitus without complications: Secondary | ICD-10-CM | POA: Diagnosis not present

## 2011-11-01 DIAGNOSIS — J449 Chronic obstructive pulmonary disease, unspecified: Secondary | ICD-10-CM | POA: Diagnosis not present

## 2011-11-01 DIAGNOSIS — I259 Chronic ischemic heart disease, unspecified: Secondary | ICD-10-CM | POA: Diagnosis not present

## 2011-11-03 DIAGNOSIS — E119 Type 2 diabetes mellitus without complications: Secondary | ICD-10-CM | POA: Diagnosis not present

## 2011-11-03 DIAGNOSIS — J441 Chronic obstructive pulmonary disease with (acute) exacerbation: Secondary | ICD-10-CM | POA: Diagnosis not present

## 2011-11-03 DIAGNOSIS — B370 Candidal stomatitis: Secondary | ICD-10-CM | POA: Diagnosis not present

## 2011-11-03 DIAGNOSIS — J189 Pneumonia, unspecified organism: Secondary | ICD-10-CM | POA: Diagnosis not present

## 2011-11-04 DIAGNOSIS — J189 Pneumonia, unspecified organism: Secondary | ICD-10-CM | POA: Diagnosis not present

## 2011-11-04 DIAGNOSIS — J449 Chronic obstructive pulmonary disease, unspecified: Secondary | ICD-10-CM | POA: Diagnosis not present

## 2011-11-04 DIAGNOSIS — I259 Chronic ischemic heart disease, unspecified: Secondary | ICD-10-CM | POA: Diagnosis not present

## 2011-11-04 DIAGNOSIS — E7800 Pure hypercholesterolemia, unspecified: Secondary | ICD-10-CM | POA: Diagnosis not present

## 2011-11-05 DIAGNOSIS — E878 Other disorders of electrolyte and fluid balance, not elsewhere classified: Secondary | ICD-10-CM | POA: Diagnosis not present

## 2011-11-05 DIAGNOSIS — E119 Type 2 diabetes mellitus without complications: Secondary | ICD-10-CM | POA: Diagnosis not present

## 2011-11-05 DIAGNOSIS — B370 Candidal stomatitis: Secondary | ICD-10-CM | POA: Diagnosis not present

## 2011-11-05 DIAGNOSIS — J189 Pneumonia, unspecified organism: Secondary | ICD-10-CM | POA: Diagnosis not present

## 2011-11-05 DIAGNOSIS — J441 Chronic obstructive pulmonary disease with (acute) exacerbation: Secondary | ICD-10-CM | POA: Diagnosis not present

## 2011-11-06 DIAGNOSIS — IMO0002 Reserved for concepts with insufficient information to code with codable children: Secondary | ICD-10-CM | POA: Diagnosis not present

## 2011-11-06 DIAGNOSIS — G894 Chronic pain syndrome: Secondary | ICD-10-CM | POA: Diagnosis not present

## 2011-11-06 DIAGNOSIS — M25519 Pain in unspecified shoulder: Secondary | ICD-10-CM | POA: Diagnosis not present

## 2011-11-06 DIAGNOSIS — M961 Postlaminectomy syndrome, not elsewhere classified: Secondary | ICD-10-CM | POA: Diagnosis not present

## 2011-11-09 DIAGNOSIS — J441 Chronic obstructive pulmonary disease with (acute) exacerbation: Secondary | ICD-10-CM | POA: Diagnosis not present

## 2011-11-09 DIAGNOSIS — B370 Candidal stomatitis: Secondary | ICD-10-CM | POA: Diagnosis not present

## 2011-11-09 DIAGNOSIS — E119 Type 2 diabetes mellitus without complications: Secondary | ICD-10-CM | POA: Diagnosis not present

## 2011-11-09 DIAGNOSIS — J189 Pneumonia, unspecified organism: Secondary | ICD-10-CM | POA: Diagnosis not present

## 2011-11-11 DIAGNOSIS — M1710 Unilateral primary osteoarthritis, unspecified knee: Secondary | ICD-10-CM | POA: Diagnosis not present

## 2011-11-13 DIAGNOSIS — J441 Chronic obstructive pulmonary disease with (acute) exacerbation: Secondary | ICD-10-CM | POA: Diagnosis not present

## 2011-11-13 DIAGNOSIS — J189 Pneumonia, unspecified organism: Secondary | ICD-10-CM | POA: Diagnosis not present

## 2011-11-13 DIAGNOSIS — B370 Candidal stomatitis: Secondary | ICD-10-CM | POA: Diagnosis not present

## 2011-11-13 DIAGNOSIS — E119 Type 2 diabetes mellitus without complications: Secondary | ICD-10-CM | POA: Diagnosis not present

## 2011-11-15 DIAGNOSIS — E119 Type 2 diabetes mellitus without complications: Secondary | ICD-10-CM | POA: Diagnosis not present

## 2011-11-15 DIAGNOSIS — Z87891 Personal history of nicotine dependence: Secondary | ICD-10-CM | POA: Diagnosis not present

## 2011-11-15 DIAGNOSIS — M549 Dorsalgia, unspecified: Secondary | ICD-10-CM | POA: Diagnosis not present

## 2011-11-15 DIAGNOSIS — IMO0002 Reserved for concepts with insufficient information to code with codable children: Secondary | ICD-10-CM | POA: Diagnosis not present

## 2011-11-15 DIAGNOSIS — G8929 Other chronic pain: Secondary | ICD-10-CM | POA: Diagnosis not present

## 2011-11-15 DIAGNOSIS — J209 Acute bronchitis, unspecified: Secondary | ICD-10-CM | POA: Diagnosis not present

## 2011-11-15 DIAGNOSIS — Z9861 Coronary angioplasty status: Secondary | ICD-10-CM | POA: Diagnosis not present

## 2011-11-15 DIAGNOSIS — J40 Bronchitis, not specified as acute or chronic: Secondary | ICD-10-CM | POA: Diagnosis not present

## 2011-11-15 DIAGNOSIS — I10 Essential (primary) hypertension: Secondary | ICD-10-CM | POA: Diagnosis not present

## 2011-11-15 DIAGNOSIS — M542 Cervicalgia: Secondary | ICD-10-CM | POA: Diagnosis not present

## 2011-11-15 DIAGNOSIS — R0602 Shortness of breath: Secondary | ICD-10-CM | POA: Diagnosis not present

## 2011-11-15 DIAGNOSIS — Z794 Long term (current) use of insulin: Secondary | ICD-10-CM | POA: Diagnosis not present

## 2011-11-15 DIAGNOSIS — R05 Cough: Secondary | ICD-10-CM | POA: Diagnosis not present

## 2011-11-15 DIAGNOSIS — R9431 Abnormal electrocardiogram [ECG] [EKG]: Secondary | ICD-10-CM | POA: Diagnosis not present

## 2011-11-15 DIAGNOSIS — J449 Chronic obstructive pulmonary disease, unspecified: Secondary | ICD-10-CM | POA: Diagnosis not present

## 2011-11-15 DIAGNOSIS — I252 Old myocardial infarction: Secondary | ICD-10-CM | POA: Diagnosis not present

## 2011-11-15 DIAGNOSIS — Z7982 Long term (current) use of aspirin: Secondary | ICD-10-CM | POA: Diagnosis not present

## 2011-11-15 DIAGNOSIS — I4460 Unspecified fascicular block: Secondary | ICD-10-CM | POA: Diagnosis not present

## 2011-11-15 DIAGNOSIS — Z79899 Other long term (current) drug therapy: Secondary | ICD-10-CM | POA: Diagnosis not present

## 2011-11-15 DIAGNOSIS — R42 Dizziness and giddiness: Secondary | ICD-10-CM | POA: Diagnosis not present

## 2011-11-16 DIAGNOSIS — I259 Chronic ischemic heart disease, unspecified: Secondary | ICD-10-CM | POA: Diagnosis not present

## 2011-11-16 DIAGNOSIS — E7800 Pure hypercholesterolemia, unspecified: Secondary | ICD-10-CM | POA: Diagnosis not present

## 2011-11-16 DIAGNOSIS — J449 Chronic obstructive pulmonary disease, unspecified: Secondary | ICD-10-CM | POA: Diagnosis not present

## 2011-11-16 DIAGNOSIS — J209 Acute bronchitis, unspecified: Secondary | ICD-10-CM | POA: Diagnosis not present

## 2011-11-18 DIAGNOSIS — K2270 Barrett's esophagus without dysplasia: Secondary | ICD-10-CM | POA: Diagnosis not present

## 2011-11-18 DIAGNOSIS — R1319 Other dysphagia: Secondary | ICD-10-CM | POA: Diagnosis not present

## 2011-11-18 DIAGNOSIS — D508 Other iron deficiency anemias: Secondary | ICD-10-CM | POA: Diagnosis not present

## 2011-11-19 DIAGNOSIS — J441 Chronic obstructive pulmonary disease with (acute) exacerbation: Secondary | ICD-10-CM | POA: Diagnosis not present

## 2011-11-19 DIAGNOSIS — E119 Type 2 diabetes mellitus without complications: Secondary | ICD-10-CM | POA: Diagnosis not present

## 2011-11-19 DIAGNOSIS — B370 Candidal stomatitis: Secondary | ICD-10-CM | POA: Diagnosis not present

## 2011-11-19 DIAGNOSIS — J189 Pneumonia, unspecified organism: Secondary | ICD-10-CM | POA: Diagnosis not present

## 2011-11-23 DIAGNOSIS — E119 Type 2 diabetes mellitus without complications: Secondary | ICD-10-CM | POA: Diagnosis not present

## 2011-11-23 DIAGNOSIS — I252 Old myocardial infarction: Secondary | ICD-10-CM | POA: Diagnosis not present

## 2011-11-23 DIAGNOSIS — Z9861 Coronary angioplasty status: Secondary | ICD-10-CM | POA: Diagnosis not present

## 2011-11-23 DIAGNOSIS — K219 Gastro-esophageal reflux disease without esophagitis: Secondary | ICD-10-CM | POA: Diagnosis not present

## 2011-11-23 DIAGNOSIS — I10 Essential (primary) hypertension: Secondary | ICD-10-CM | POA: Diagnosis not present

## 2011-11-23 DIAGNOSIS — J449 Chronic obstructive pulmonary disease, unspecified: Secondary | ICD-10-CM | POA: Diagnosis not present

## 2011-11-23 DIAGNOSIS — G8929 Other chronic pain: Secondary | ICD-10-CM | POA: Diagnosis not present

## 2011-11-23 DIAGNOSIS — Z87891 Personal history of nicotine dependence: Secondary | ICD-10-CM | POA: Diagnosis not present

## 2011-11-23 DIAGNOSIS — R9431 Abnormal electrocardiogram [ECG] [EKG]: Secondary | ICD-10-CM | POA: Diagnosis not present

## 2011-11-23 DIAGNOSIS — R918 Other nonspecific abnormal finding of lung field: Secondary | ICD-10-CM | POA: Diagnosis not present

## 2011-11-23 DIAGNOSIS — R05 Cough: Secondary | ICD-10-CM | POA: Diagnosis not present

## 2011-11-23 DIAGNOSIS — R0602 Shortness of breath: Secondary | ICD-10-CM | POA: Diagnosis not present

## 2011-11-23 DIAGNOSIS — B370 Candidal stomatitis: Secondary | ICD-10-CM | POA: Diagnosis not present

## 2011-11-25 DIAGNOSIS — M1710 Unilateral primary osteoarthritis, unspecified knee: Secondary | ICD-10-CM | POA: Diagnosis not present

## 2011-11-25 DIAGNOSIS — M76899 Other specified enthesopathies of unspecified lower limb, excluding foot: Secondary | ICD-10-CM | POA: Diagnosis not present

## 2011-11-27 DIAGNOSIS — K2940 Chronic atrophic gastritis without bleeding: Secondary | ICD-10-CM | POA: Diagnosis not present

## 2011-11-27 DIAGNOSIS — Z87891 Personal history of nicotine dependence: Secondary | ICD-10-CM | POA: Diagnosis not present

## 2011-11-27 DIAGNOSIS — Z9861 Coronary angioplasty status: Secondary | ICD-10-CM | POA: Diagnosis not present

## 2011-11-27 DIAGNOSIS — K449 Diaphragmatic hernia without obstruction or gangrene: Secondary | ICD-10-CM | POA: Diagnosis not present

## 2011-11-27 DIAGNOSIS — K5730 Diverticulosis of large intestine without perforation or abscess without bleeding: Secondary | ICD-10-CM | POA: Diagnosis not present

## 2011-11-27 DIAGNOSIS — I2510 Atherosclerotic heart disease of native coronary artery without angina pectoris: Secondary | ICD-10-CM | POA: Diagnosis not present

## 2011-11-27 DIAGNOSIS — D508 Other iron deficiency anemias: Secondary | ICD-10-CM | POA: Diagnosis not present

## 2011-11-27 DIAGNOSIS — Z981 Arthrodesis status: Secondary | ICD-10-CM | POA: Diagnosis not present

## 2011-11-27 DIAGNOSIS — K2270 Barrett's esophagus without dysplasia: Secondary | ICD-10-CM | POA: Diagnosis not present

## 2011-11-27 DIAGNOSIS — I252 Old myocardial infarction: Secondary | ICD-10-CM | POA: Diagnosis not present

## 2011-11-27 DIAGNOSIS — D649 Anemia, unspecified: Secondary | ICD-10-CM | POA: Diagnosis not present

## 2011-11-27 DIAGNOSIS — J449 Chronic obstructive pulmonary disease, unspecified: Secondary | ICD-10-CM | POA: Diagnosis not present

## 2011-11-27 DIAGNOSIS — E119 Type 2 diabetes mellitus without complications: Secondary | ICD-10-CM | POA: Diagnosis not present

## 2011-11-27 DIAGNOSIS — K648 Other hemorrhoids: Secondary | ICD-10-CM | POA: Diagnosis not present

## 2011-11-27 DIAGNOSIS — R634 Abnormal weight loss: Secondary | ICD-10-CM | POA: Diagnosis not present

## 2011-11-27 DIAGNOSIS — Z5181 Encounter for therapeutic drug level monitoring: Secondary | ICD-10-CM | POA: Diagnosis not present

## 2011-11-27 DIAGNOSIS — I209 Angina pectoris, unspecified: Secondary | ICD-10-CM | POA: Diagnosis not present

## 2011-12-02 DIAGNOSIS — G894 Chronic pain syndrome: Secondary | ICD-10-CM | POA: Diagnosis not present

## 2011-12-02 DIAGNOSIS — M961 Postlaminectomy syndrome, not elsewhere classified: Secondary | ICD-10-CM | POA: Diagnosis not present

## 2011-12-02 DIAGNOSIS — M76899 Other specified enthesopathies of unspecified lower limb, excluding foot: Secondary | ICD-10-CM | POA: Diagnosis not present

## 2011-12-09 DIAGNOSIS — M76899 Other specified enthesopathies of unspecified lower limb, excluding foot: Secondary | ICD-10-CM | POA: Diagnosis not present

## 2011-12-10 DIAGNOSIS — I4460 Unspecified fascicular block: Secondary | ICD-10-CM | POA: Diagnosis not present

## 2011-12-10 DIAGNOSIS — R9431 Abnormal electrocardiogram [ECG] [EKG]: Secondary | ICD-10-CM | POA: Diagnosis not present

## 2011-12-10 DIAGNOSIS — R0602 Shortness of breath: Secondary | ICD-10-CM | POA: Diagnosis not present

## 2011-12-10 DIAGNOSIS — M169 Osteoarthritis of hip, unspecified: Secondary | ICD-10-CM | POA: Diagnosis not present

## 2011-12-10 DIAGNOSIS — Z9861 Coronary angioplasty status: Secondary | ICD-10-CM | POA: Diagnosis not present

## 2011-12-10 DIAGNOSIS — J984 Other disorders of lung: Secondary | ICD-10-CM | POA: Diagnosis not present

## 2011-12-11 DIAGNOSIS — J189 Pneumonia, unspecified organism: Secondary | ICD-10-CM | POA: Diagnosis not present

## 2011-12-11 DIAGNOSIS — E119 Type 2 diabetes mellitus without complications: Secondary | ICD-10-CM | POA: Diagnosis not present

## 2011-12-11 DIAGNOSIS — B370 Candidal stomatitis: Secondary | ICD-10-CM | POA: Diagnosis not present

## 2011-12-11 DIAGNOSIS — J441 Chronic obstructive pulmonary disease with (acute) exacerbation: Secondary | ICD-10-CM | POA: Diagnosis not present

## 2011-12-17 DIAGNOSIS — Z9861 Coronary angioplasty status: Secondary | ICD-10-CM | POA: Diagnosis not present

## 2011-12-17 DIAGNOSIS — K219 Gastro-esophageal reflux disease without esophagitis: Secondary | ICD-10-CM | POA: Diagnosis not present

## 2011-12-17 DIAGNOSIS — M542 Cervicalgia: Secondary | ICD-10-CM | POA: Diagnosis not present

## 2011-12-17 DIAGNOSIS — J449 Chronic obstructive pulmonary disease, unspecified: Secondary | ICD-10-CM | POA: Diagnosis not present

## 2011-12-17 DIAGNOSIS — E119 Type 2 diabetes mellitus without complications: Secondary | ICD-10-CM | POA: Diagnosis not present

## 2011-12-17 DIAGNOSIS — M25569 Pain in unspecified knee: Secondary | ICD-10-CM | POA: Diagnosis not present

## 2011-12-17 DIAGNOSIS — M62838 Other muscle spasm: Secondary | ICD-10-CM | POA: Diagnosis not present

## 2011-12-17 DIAGNOSIS — Z794 Long term (current) use of insulin: Secondary | ICD-10-CM | POA: Diagnosis not present

## 2011-12-17 DIAGNOSIS — I10 Essential (primary) hypertension: Secondary | ICD-10-CM | POA: Diagnosis not present

## 2011-12-17 DIAGNOSIS — M79609 Pain in unspecified limb: Secondary | ICD-10-CM | POA: Diagnosis not present

## 2011-12-17 DIAGNOSIS — Z933 Colostomy status: Secondary | ICD-10-CM | POA: Diagnosis not present

## 2011-12-17 DIAGNOSIS — R209 Unspecified disturbances of skin sensation: Secondary | ICD-10-CM | POA: Diagnosis not present

## 2011-12-17 DIAGNOSIS — Z7982 Long term (current) use of aspirin: Secondary | ICD-10-CM | POA: Diagnosis not present

## 2011-12-17 DIAGNOSIS — Z79899 Other long term (current) drug therapy: Secondary | ICD-10-CM | POA: Diagnosis not present

## 2011-12-17 DIAGNOSIS — J9819 Other pulmonary collapse: Secondary | ICD-10-CM | POA: Diagnosis not present

## 2011-12-17 DIAGNOSIS — G8929 Other chronic pain: Secondary | ICD-10-CM | POA: Diagnosis not present

## 2011-12-17 DIAGNOSIS — I252 Old myocardial infarction: Secondary | ICD-10-CM | POA: Diagnosis not present

## 2011-12-21 DIAGNOSIS — R42 Dizziness and giddiness: Secondary | ICD-10-CM | POA: Diagnosis not present

## 2011-12-21 DIAGNOSIS — G8929 Other chronic pain: Secondary | ICD-10-CM | POA: Diagnosis not present

## 2011-12-21 DIAGNOSIS — L089 Local infection of the skin and subcutaneous tissue, unspecified: Secondary | ICD-10-CM | POA: Diagnosis not present

## 2011-12-22 DIAGNOSIS — Z87891 Personal history of nicotine dependence: Secondary | ICD-10-CM | POA: Diagnosis not present

## 2011-12-22 DIAGNOSIS — I10 Essential (primary) hypertension: Secondary | ICD-10-CM | POA: Diagnosis not present

## 2011-12-22 DIAGNOSIS — J984 Other disorders of lung: Secondary | ICD-10-CM | POA: Diagnosis not present

## 2011-12-22 DIAGNOSIS — R9431 Abnormal electrocardiogram [ECG] [EKG]: Secondary | ICD-10-CM | POA: Diagnosis not present

## 2011-12-22 DIAGNOSIS — Z9861 Coronary angioplasty status: Secondary | ICD-10-CM | POA: Diagnosis not present

## 2011-12-22 DIAGNOSIS — G4730 Sleep apnea, unspecified: Secondary | ICD-10-CM | POA: Diagnosis not present

## 2011-12-22 DIAGNOSIS — R42 Dizziness and giddiness: Secondary | ICD-10-CM | POA: Diagnosis not present

## 2011-12-22 DIAGNOSIS — I252 Old myocardial infarction: Secondary | ICD-10-CM | POA: Diagnosis not present

## 2011-12-22 DIAGNOSIS — J449 Chronic obstructive pulmonary disease, unspecified: Secondary | ICD-10-CM | POA: Diagnosis not present

## 2011-12-22 DIAGNOSIS — Z794 Long term (current) use of insulin: Secondary | ICD-10-CM | POA: Diagnosis not present

## 2011-12-22 DIAGNOSIS — Z7982 Long term (current) use of aspirin: Secondary | ICD-10-CM | POA: Diagnosis not present

## 2011-12-22 DIAGNOSIS — E119 Type 2 diabetes mellitus without complications: Secondary | ICD-10-CM | POA: Diagnosis not present

## 2011-12-22 DIAGNOSIS — I4460 Unspecified fascicular block: Secondary | ICD-10-CM | POA: Diagnosis not present

## 2011-12-22 DIAGNOSIS — K219 Gastro-esophageal reflux disease without esophagitis: Secondary | ICD-10-CM | POA: Diagnosis not present

## 2011-12-22 DIAGNOSIS — G8929 Other chronic pain: Secondary | ICD-10-CM | POA: Diagnosis not present

## 2011-12-23 DIAGNOSIS — M1710 Unilateral primary osteoarthritis, unspecified knee: Secondary | ICD-10-CM | POA: Diagnosis not present

## 2011-12-25 DIAGNOSIS — J449 Chronic obstructive pulmonary disease, unspecified: Secondary | ICD-10-CM | POA: Diagnosis not present

## 2011-12-25 DIAGNOSIS — M25559 Pain in unspecified hip: Secondary | ICD-10-CM | POA: Diagnosis not present

## 2011-12-25 DIAGNOSIS — R5381 Other malaise: Secondary | ICD-10-CM | POA: Diagnosis not present

## 2011-12-25 DIAGNOSIS — M25529 Pain in unspecified elbow: Secondary | ICD-10-CM | POA: Diagnosis not present

## 2011-12-28 DIAGNOSIS — I509 Heart failure, unspecified: Secondary | ICD-10-CM | POA: Diagnosis not present

## 2011-12-28 DIAGNOSIS — E119 Type 2 diabetes mellitus without complications: Secondary | ICD-10-CM | POA: Diagnosis not present

## 2011-12-28 DIAGNOSIS — Z23 Encounter for immunization: Secondary | ICD-10-CM | POA: Diagnosis not present

## 2011-12-28 DIAGNOSIS — J449 Chronic obstructive pulmonary disease, unspecified: Secondary | ICD-10-CM | POA: Diagnosis not present

## 2011-12-30 DIAGNOSIS — M961 Postlaminectomy syndrome, not elsewhere classified: Secondary | ICD-10-CM | POA: Diagnosis not present

## 2011-12-30 DIAGNOSIS — IMO0002 Reserved for concepts with insufficient information to code with codable children: Secondary | ICD-10-CM | POA: Diagnosis not present

## 2011-12-30 DIAGNOSIS — G894 Chronic pain syndrome: Secondary | ICD-10-CM | POA: Diagnosis not present

## 2012-01-13 DIAGNOSIS — M76899 Other specified enthesopathies of unspecified lower limb, excluding foot: Secondary | ICD-10-CM | POA: Diagnosis not present

## 2012-01-14 DIAGNOSIS — S93409A Sprain of unspecified ligament of unspecified ankle, initial encounter: Secondary | ICD-10-CM | POA: Diagnosis not present

## 2012-01-14 DIAGNOSIS — S59909A Unspecified injury of unspecified elbow, initial encounter: Secondary | ICD-10-CM | POA: Diagnosis not present

## 2012-01-14 DIAGNOSIS — I252 Old myocardial infarction: Secondary | ICD-10-CM | POA: Diagnosis not present

## 2012-01-14 DIAGNOSIS — Z9861 Coronary angioplasty status: Secondary | ICD-10-CM | POA: Diagnosis not present

## 2012-01-14 DIAGNOSIS — K219 Gastro-esophageal reflux disease without esophagitis: Secondary | ICD-10-CM | POA: Diagnosis not present

## 2012-01-14 DIAGNOSIS — I10 Essential (primary) hypertension: Secondary | ICD-10-CM | POA: Diagnosis not present

## 2012-01-14 DIAGNOSIS — J449 Chronic obstructive pulmonary disease, unspecified: Secondary | ICD-10-CM | POA: Diagnosis not present

## 2012-01-14 DIAGNOSIS — S79919A Unspecified injury of unspecified hip, initial encounter: Secondary | ICD-10-CM | POA: Diagnosis not present

## 2012-01-14 DIAGNOSIS — S8990XA Unspecified injury of unspecified lower leg, initial encounter: Secondary | ICD-10-CM | POA: Diagnosis not present

## 2012-01-14 DIAGNOSIS — E119 Type 2 diabetes mellitus without complications: Secondary | ICD-10-CM | POA: Diagnosis not present

## 2012-01-14 DIAGNOSIS — S99929A Unspecified injury of unspecified foot, initial encounter: Secondary | ICD-10-CM | POA: Diagnosis not present

## 2012-01-14 DIAGNOSIS — IMO0002 Reserved for concepts with insufficient information to code with codable children: Secondary | ICD-10-CM | POA: Diagnosis not present

## 2012-01-14 DIAGNOSIS — Z87891 Personal history of nicotine dependence: Secondary | ICD-10-CM | POA: Diagnosis not present

## 2012-01-14 DIAGNOSIS — S79929A Unspecified injury of unspecified thigh, initial encounter: Secondary | ICD-10-CM | POA: Diagnosis not present

## 2012-01-22 DIAGNOSIS — M961 Postlaminectomy syndrome, not elsewhere classified: Secondary | ICD-10-CM | POA: Diagnosis not present

## 2012-01-22 DIAGNOSIS — G894 Chronic pain syndrome: Secondary | ICD-10-CM | POA: Diagnosis not present

## 2012-01-22 DIAGNOSIS — G8929 Other chronic pain: Secondary | ICD-10-CM | POA: Diagnosis not present

## 2012-01-22 DIAGNOSIS — IMO0002 Reserved for concepts with insufficient information to code with codable children: Secondary | ICD-10-CM | POA: Diagnosis not present

## 2012-01-27 DIAGNOSIS — M961 Postlaminectomy syndrome, not elsewhere classified: Secondary | ICD-10-CM | POA: Diagnosis not present

## 2012-01-27 DIAGNOSIS — IMO0002 Reserved for concepts with insufficient information to code with codable children: Secondary | ICD-10-CM | POA: Diagnosis not present

## 2012-01-27 DIAGNOSIS — G894 Chronic pain syndrome: Secondary | ICD-10-CM | POA: Diagnosis not present

## 2012-02-01 DIAGNOSIS — G4700 Insomnia, unspecified: Secondary | ICD-10-CM | POA: Diagnosis not present

## 2012-02-01 DIAGNOSIS — J449 Chronic obstructive pulmonary disease, unspecified: Secondary | ICD-10-CM | POA: Diagnosis not present

## 2012-02-01 DIAGNOSIS — R110 Nausea: Secondary | ICD-10-CM | POA: Diagnosis not present

## 2012-02-01 DIAGNOSIS — IMO0001 Reserved for inherently not codable concepts without codable children: Secondary | ICD-10-CM | POA: Diagnosis not present

## 2012-02-09 DIAGNOSIS — R42 Dizziness and giddiness: Secondary | ICD-10-CM | POA: Diagnosis not present

## 2012-02-09 DIAGNOSIS — J984 Other disorders of lung: Secondary | ICD-10-CM | POA: Diagnosis not present

## 2012-02-09 DIAGNOSIS — R5381 Other malaise: Secondary | ICD-10-CM | POA: Diagnosis not present

## 2012-02-09 DIAGNOSIS — M79609 Pain in unspecified limb: Secondary | ICD-10-CM | POA: Diagnosis not present

## 2012-02-09 DIAGNOSIS — S93409A Sprain of unspecified ligament of unspecified ankle, initial encounter: Secondary | ICD-10-CM | POA: Diagnosis not present

## 2012-02-09 DIAGNOSIS — J449 Chronic obstructive pulmonary disease, unspecified: Secondary | ICD-10-CM | POA: Diagnosis not present

## 2012-02-09 DIAGNOSIS — Z87891 Personal history of nicotine dependence: Secondary | ICD-10-CM | POA: Diagnosis not present

## 2012-02-09 DIAGNOSIS — R9431 Abnormal electrocardiogram [ECG] [EKG]: Secondary | ICD-10-CM | POA: Diagnosis not present

## 2012-02-09 DIAGNOSIS — E119 Type 2 diabetes mellitus without complications: Secondary | ICD-10-CM | POA: Diagnosis not present

## 2012-02-09 DIAGNOSIS — IMO0001 Reserved for inherently not codable concepts without codable children: Secondary | ICD-10-CM | POA: Diagnosis not present

## 2012-02-09 DIAGNOSIS — R1032 Left lower quadrant pain: Secondary | ICD-10-CM | POA: Diagnosis not present

## 2012-02-09 DIAGNOSIS — H5310 Unspecified subjective visual disturbances: Secondary | ICD-10-CM | POA: Diagnosis not present

## 2012-02-09 DIAGNOSIS — Z7982 Long term (current) use of aspirin: Secondary | ICD-10-CM | POA: Diagnosis not present

## 2012-02-09 DIAGNOSIS — Z9861 Coronary angioplasty status: Secondary | ICD-10-CM | POA: Diagnosis not present

## 2012-02-09 DIAGNOSIS — Z794 Long term (current) use of insulin: Secondary | ICD-10-CM | POA: Diagnosis not present

## 2012-02-09 DIAGNOSIS — I10 Essential (primary) hypertension: Secondary | ICD-10-CM | POA: Diagnosis not present

## 2012-02-09 DIAGNOSIS — M25579 Pain in unspecified ankle and joints of unspecified foot: Secondary | ICD-10-CM | POA: Diagnosis not present

## 2012-02-09 DIAGNOSIS — I252 Old myocardial infarction: Secondary | ICD-10-CM | POA: Diagnosis not present

## 2012-02-09 DIAGNOSIS — Z79899 Other long term (current) drug therapy: Secondary | ICD-10-CM | POA: Diagnosis not present

## 2012-02-09 DIAGNOSIS — K219 Gastro-esophageal reflux disease without esophagitis: Secondary | ICD-10-CM | POA: Diagnosis not present

## 2012-02-09 DIAGNOSIS — I4460 Unspecified fascicular block: Secondary | ICD-10-CM | POA: Diagnosis not present

## 2012-02-09 DIAGNOSIS — G8929 Other chronic pain: Secondary | ICD-10-CM | POA: Diagnosis not present

## 2012-02-11 DIAGNOSIS — Z0189 Encounter for other specified special examinations: Secondary | ICD-10-CM | POA: Diagnosis not present

## 2012-02-12 DIAGNOSIS — M76899 Other specified enthesopathies of unspecified lower limb, excluding foot: Secondary | ICD-10-CM | POA: Diagnosis not present

## 2012-02-12 DIAGNOSIS — G894 Chronic pain syndrome: Secondary | ICD-10-CM | POA: Diagnosis not present

## 2012-02-12 DIAGNOSIS — IMO0002 Reserved for concepts with insufficient information to code with codable children: Secondary | ICD-10-CM | POA: Diagnosis not present

## 2012-02-12 DIAGNOSIS — M961 Postlaminectomy syndrome, not elsewhere classified: Secondary | ICD-10-CM | POA: Diagnosis not present

## 2012-02-16 DIAGNOSIS — R5383 Other fatigue: Secondary | ICD-10-CM | POA: Diagnosis not present

## 2012-02-16 DIAGNOSIS — R5381 Other malaise: Secondary | ICD-10-CM | POA: Diagnosis not present

## 2012-02-16 DIAGNOSIS — J209 Acute bronchitis, unspecified: Secondary | ICD-10-CM | POA: Diagnosis not present

## 2012-02-16 DIAGNOSIS — J449 Chronic obstructive pulmonary disease, unspecified: Secondary | ICD-10-CM | POA: Diagnosis not present

## 2012-02-16 DIAGNOSIS — R05 Cough: Secondary | ICD-10-CM | POA: Diagnosis not present

## 2012-02-20 DIAGNOSIS — Z9861 Coronary angioplasty status: Secondary | ICD-10-CM | POA: Diagnosis not present

## 2012-02-20 DIAGNOSIS — Z933 Colostomy status: Secondary | ICD-10-CM | POA: Diagnosis not present

## 2012-02-20 DIAGNOSIS — E119 Type 2 diabetes mellitus without complications: Secondary | ICD-10-CM | POA: Diagnosis not present

## 2012-02-20 DIAGNOSIS — M79609 Pain in unspecified limb: Secondary | ICD-10-CM | POA: Diagnosis not present

## 2012-02-20 DIAGNOSIS — I10 Essential (primary) hypertension: Secondary | ICD-10-CM | POA: Diagnosis not present

## 2012-02-20 DIAGNOSIS — K219 Gastro-esophageal reflux disease without esophagitis: Secondary | ICD-10-CM | POA: Diagnosis not present

## 2012-02-20 DIAGNOSIS — D649 Anemia, unspecified: Secondary | ICD-10-CM | POA: Diagnosis not present

## 2012-02-20 DIAGNOSIS — Z87891 Personal history of nicotine dependence: Secondary | ICD-10-CM | POA: Diagnosis not present

## 2012-02-20 DIAGNOSIS — Z794 Long term (current) use of insulin: Secondary | ICD-10-CM | POA: Diagnosis not present

## 2012-02-20 DIAGNOSIS — R252 Cramp and spasm: Secondary | ICD-10-CM | POA: Diagnosis not present

## 2012-02-20 DIAGNOSIS — M549 Dorsalgia, unspecified: Secondary | ICD-10-CM | POA: Diagnosis not present

## 2012-02-20 DIAGNOSIS — Z79899 Other long term (current) drug therapy: Secondary | ICD-10-CM | POA: Diagnosis not present

## 2012-02-20 DIAGNOSIS — G8929 Other chronic pain: Secondary | ICD-10-CM | POA: Diagnosis not present

## 2012-02-20 DIAGNOSIS — J449 Chronic obstructive pulmonary disease, unspecified: Secondary | ICD-10-CM | POA: Diagnosis not present

## 2012-02-20 DIAGNOSIS — M25569 Pain in unspecified knee: Secondary | ICD-10-CM | POA: Diagnosis not present

## 2012-02-20 DIAGNOSIS — I252 Old myocardial infarction: Secondary | ICD-10-CM | POA: Diagnosis not present

## 2012-02-20 DIAGNOSIS — Z7982 Long term (current) use of aspirin: Secondary | ICD-10-CM | POA: Diagnosis not present

## 2012-02-22 DIAGNOSIS — D649 Anemia, unspecified: Secondary | ICD-10-CM | POA: Diagnosis not present

## 2012-02-22 DIAGNOSIS — Z794 Long term (current) use of insulin: Secondary | ICD-10-CM | POA: Diagnosis not present

## 2012-02-22 DIAGNOSIS — I4460 Unspecified fascicular block: Secondary | ICD-10-CM | POA: Diagnosis not present

## 2012-02-22 DIAGNOSIS — Z87891 Personal history of nicotine dependence: Secondary | ICD-10-CM | POA: Diagnosis not present

## 2012-02-22 DIAGNOSIS — Z9861 Coronary angioplasty status: Secondary | ICD-10-CM | POA: Diagnosis not present

## 2012-02-22 DIAGNOSIS — R918 Other nonspecific abnormal finding of lung field: Secondary | ICD-10-CM | POA: Diagnosis not present

## 2012-02-22 DIAGNOSIS — R9431 Abnormal electrocardiogram [ECG] [EKG]: Secondary | ICD-10-CM | POA: Diagnosis not present

## 2012-02-22 DIAGNOSIS — G8929 Other chronic pain: Secondary | ICD-10-CM | POA: Diagnosis not present

## 2012-02-22 DIAGNOSIS — I252 Old myocardial infarction: Secondary | ICD-10-CM | POA: Diagnosis not present

## 2012-02-22 DIAGNOSIS — I10 Essential (primary) hypertension: Secondary | ICD-10-CM | POA: Diagnosis not present

## 2012-02-22 DIAGNOSIS — Z7982 Long term (current) use of aspirin: Secondary | ICD-10-CM | POA: Diagnosis not present

## 2012-02-22 DIAGNOSIS — R55 Syncope and collapse: Secondary | ICD-10-CM | POA: Diagnosis not present

## 2012-02-22 DIAGNOSIS — G459 Transient cerebral ischemic attack, unspecified: Secondary | ICD-10-CM | POA: Diagnosis not present

## 2012-02-22 DIAGNOSIS — R42 Dizziness and giddiness: Secondary | ICD-10-CM | POA: Diagnosis not present

## 2012-02-22 DIAGNOSIS — J449 Chronic obstructive pulmonary disease, unspecified: Secondary | ICD-10-CM | POA: Diagnosis not present

## 2012-02-22 DIAGNOSIS — Z79899 Other long term (current) drug therapy: Secondary | ICD-10-CM | POA: Diagnosis not present

## 2012-02-22 DIAGNOSIS — K219 Gastro-esophageal reflux disease without esophagitis: Secondary | ICD-10-CM | POA: Diagnosis not present

## 2012-02-22 DIAGNOSIS — M25569 Pain in unspecified knee: Secondary | ICD-10-CM | POA: Diagnosis not present

## 2012-02-22 DIAGNOSIS — M542 Cervicalgia: Secondary | ICD-10-CM | POA: Diagnosis not present

## 2012-02-24 DIAGNOSIS — I119 Hypertensive heart disease without heart failure: Secondary | ICD-10-CM | POA: Diagnosis not present

## 2012-02-24 DIAGNOSIS — M129 Arthropathy, unspecified: Secondary | ICD-10-CM | POA: Diagnosis not present

## 2012-02-24 DIAGNOSIS — Z23 Encounter for immunization: Secondary | ICD-10-CM | POA: Diagnosis not present

## 2012-02-24 DIAGNOSIS — I259 Chronic ischemic heart disease, unspecified: Secondary | ICD-10-CM | POA: Diagnosis not present

## 2012-03-01 DIAGNOSIS — M961 Postlaminectomy syndrome, not elsewhere classified: Secondary | ICD-10-CM | POA: Diagnosis not present

## 2012-03-01 DIAGNOSIS — M549 Dorsalgia, unspecified: Secondary | ICD-10-CM | POA: Diagnosis not present

## 2012-03-01 DIAGNOSIS — M461 Sacroiliitis, not elsewhere classified: Secondary | ICD-10-CM | POA: Diagnosis not present

## 2012-03-01 DIAGNOSIS — G894 Chronic pain syndrome: Secondary | ICD-10-CM | POA: Diagnosis not present

## 2012-03-08 DIAGNOSIS — Z79899 Other long term (current) drug therapy: Secondary | ICD-10-CM | POA: Diagnosis not present

## 2012-03-08 DIAGNOSIS — R509 Fever, unspecified: Secondary | ICD-10-CM | POA: Diagnosis not present

## 2012-03-08 DIAGNOSIS — R110 Nausea: Secondary | ICD-10-CM | POA: Diagnosis not present

## 2012-03-08 DIAGNOSIS — R05 Cough: Secondary | ICD-10-CM | POA: Diagnosis not present

## 2012-03-08 DIAGNOSIS — R5383 Other fatigue: Secondary | ICD-10-CM | POA: Diagnosis not present

## 2012-03-08 DIAGNOSIS — J449 Chronic obstructive pulmonary disease, unspecified: Secondary | ICD-10-CM | POA: Diagnosis not present

## 2012-03-08 DIAGNOSIS — R51 Headache: Secondary | ICD-10-CM | POA: Diagnosis not present

## 2012-03-08 DIAGNOSIS — E119 Type 2 diabetes mellitus without complications: Secondary | ICD-10-CM | POA: Diagnosis not present

## 2012-03-23 DIAGNOSIS — M461 Sacroiliitis, not elsewhere classified: Secondary | ICD-10-CM | POA: Diagnosis not present

## 2012-03-23 DIAGNOSIS — M76899 Other specified enthesopathies of unspecified lower limb, excluding foot: Secondary | ICD-10-CM | POA: Diagnosis not present

## 2012-03-23 DIAGNOSIS — M549 Dorsalgia, unspecified: Secondary | ICD-10-CM | POA: Diagnosis not present

## 2012-03-31 DIAGNOSIS — I259 Chronic ischemic heart disease, unspecified: Secondary | ICD-10-CM | POA: Diagnosis not present

## 2012-03-31 DIAGNOSIS — J449 Chronic obstructive pulmonary disease, unspecified: Secondary | ICD-10-CM | POA: Diagnosis not present

## 2012-03-31 DIAGNOSIS — J209 Acute bronchitis, unspecified: Secondary | ICD-10-CM | POA: Diagnosis not present

## 2012-03-31 DIAGNOSIS — E119 Type 2 diabetes mellitus without complications: Secondary | ICD-10-CM | POA: Diagnosis not present

## 2012-04-11 DIAGNOSIS — IMO0002 Reserved for concepts with insufficient information to code with codable children: Secondary | ICD-10-CM | POA: Diagnosis not present

## 2012-04-11 DIAGNOSIS — G894 Chronic pain syndrome: Secondary | ICD-10-CM | POA: Diagnosis not present

## 2012-04-11 DIAGNOSIS — M961 Postlaminectomy syndrome, not elsewhere classified: Secondary | ICD-10-CM | POA: Diagnosis not present

## 2012-04-12 DIAGNOSIS — Z961 Presence of intraocular lens: Secondary | ICD-10-CM | POA: Diagnosis not present

## 2012-04-12 DIAGNOSIS — H35729 Serous detachment of retinal pigment epithelium, unspecified eye: Secondary | ICD-10-CM | POA: Diagnosis not present

## 2012-04-12 DIAGNOSIS — H3500 Unspecified background retinopathy: Secondary | ICD-10-CM | POA: Diagnosis not present

## 2012-04-12 DIAGNOSIS — E119 Type 2 diabetes mellitus without complications: Secondary | ICD-10-CM | POA: Diagnosis not present

## 2012-04-14 DIAGNOSIS — Z881 Allergy status to other antibiotic agents status: Secondary | ICD-10-CM | POA: Diagnosis not present

## 2012-04-14 DIAGNOSIS — R0789 Other chest pain: Secondary | ICD-10-CM | POA: Diagnosis not present

## 2012-04-14 DIAGNOSIS — E119 Type 2 diabetes mellitus without complications: Secondary | ICD-10-CM | POA: Diagnosis not present

## 2012-04-14 DIAGNOSIS — M5430 Sciatica, unspecified side: Secondary | ICD-10-CM | POA: Diagnosis not present

## 2012-04-14 DIAGNOSIS — I10 Essential (primary) hypertension: Secondary | ICD-10-CM | POA: Diagnosis not present

## 2012-04-14 DIAGNOSIS — Z9861 Coronary angioplasty status: Secondary | ICD-10-CM | POA: Diagnosis not present

## 2012-04-14 DIAGNOSIS — I2510 Atherosclerotic heart disease of native coronary artery without angina pectoris: Secondary | ICD-10-CM | POA: Diagnosis not present

## 2012-04-14 DIAGNOSIS — Z884 Allergy status to anesthetic agent status: Secondary | ICD-10-CM | POA: Diagnosis not present

## 2012-04-14 DIAGNOSIS — Z91040 Latex allergy status: Secondary | ICD-10-CM | POA: Diagnosis not present

## 2012-04-14 DIAGNOSIS — G8929 Other chronic pain: Secondary | ICD-10-CM | POA: Diagnosis not present

## 2012-04-14 DIAGNOSIS — K219 Gastro-esophageal reflux disease without esophagitis: Secondary | ICD-10-CM | POA: Diagnosis not present

## 2012-04-14 DIAGNOSIS — M79609 Pain in unspecified limb: Secondary | ICD-10-CM | POA: Diagnosis not present

## 2012-04-14 DIAGNOSIS — I252 Old myocardial infarction: Secondary | ICD-10-CM | POA: Diagnosis not present

## 2012-04-14 DIAGNOSIS — R079 Chest pain, unspecified: Secondary | ICD-10-CM | POA: Diagnosis not present

## 2012-04-14 DIAGNOSIS — J449 Chronic obstructive pulmonary disease, unspecified: Secondary | ICD-10-CM | POA: Diagnosis not present

## 2012-04-14 DIAGNOSIS — M1990 Unspecified osteoarthritis, unspecified site: Secondary | ICD-10-CM | POA: Diagnosis not present

## 2012-04-14 DIAGNOSIS — Z87891 Personal history of nicotine dependence: Secondary | ICD-10-CM | POA: Diagnosis not present

## 2012-04-14 DIAGNOSIS — Z794 Long term (current) use of insulin: Secondary | ICD-10-CM | POA: Diagnosis not present

## 2012-04-15 DIAGNOSIS — M5137 Other intervertebral disc degeneration, lumbosacral region: Secondary | ICD-10-CM | POA: Diagnosis not present

## 2012-04-15 DIAGNOSIS — G894 Chronic pain syndrome: Secondary | ICD-10-CM | POA: Diagnosis not present

## 2012-04-15 DIAGNOSIS — G8929 Other chronic pain: Secondary | ICD-10-CM | POA: Diagnosis not present

## 2012-04-15 DIAGNOSIS — M961 Postlaminectomy syndrome, not elsewhere classified: Secondary | ICD-10-CM | POA: Diagnosis not present

## 2012-04-15 DIAGNOSIS — B029 Zoster without complications: Secondary | ICD-10-CM | POA: Diagnosis not present

## 2012-04-15 DIAGNOSIS — I2510 Atherosclerotic heart disease of native coronary artery without angina pectoris: Secondary | ICD-10-CM | POA: Diagnosis not present

## 2012-04-15 DIAGNOSIS — IMO0002 Reserved for concepts with insufficient information to code with codable children: Secondary | ICD-10-CM | POA: Diagnosis not present

## 2012-04-20 DIAGNOSIS — Z0189 Encounter for other specified special examinations: Secondary | ICD-10-CM | POA: Diagnosis not present

## 2012-04-21 DIAGNOSIS — M961 Postlaminectomy syndrome, not elsewhere classified: Secondary | ICD-10-CM | POA: Diagnosis not present

## 2012-04-21 DIAGNOSIS — G894 Chronic pain syndrome: Secondary | ICD-10-CM | POA: Diagnosis not present

## 2012-04-21 DIAGNOSIS — IMO0002 Reserved for concepts with insufficient information to code with codable children: Secondary | ICD-10-CM | POA: Diagnosis not present

## 2012-05-01 ENCOUNTER — Encounter (HOSPITAL_COMMUNITY): Payer: Self-pay

## 2012-05-01 ENCOUNTER — Emergency Department (HOSPITAL_COMMUNITY)
Admission: EM | Admit: 2012-05-01 | Discharge: 2012-05-01 | Disposition: A | Discharge status: Home / Self Care | Payer: Medicare Other | Attending: Emergency Medicine | Admitting: Emergency Medicine

## 2012-05-01 DIAGNOSIS — M545 Low back pain, unspecified: Secondary | ICD-10-CM | POA: Diagnosis not present

## 2012-05-01 DIAGNOSIS — M25569 Pain in unspecified knee: Secondary | ICD-10-CM | POA: Insufficient documentation

## 2012-05-01 DIAGNOSIS — M2550 Pain in unspecified joint: Secondary | ICD-10-CM | POA: Diagnosis not present

## 2012-05-01 DIAGNOSIS — Z8674 Personal history of sudden cardiac arrest: Secondary | ICD-10-CM | POA: Diagnosis not present

## 2012-05-01 DIAGNOSIS — J4489 Other specified chronic obstructive pulmonary disease: Secondary | ICD-10-CM | POA: Insufficient documentation

## 2012-05-01 DIAGNOSIS — J449 Chronic obstructive pulmonary disease, unspecified: Secondary | ICD-10-CM | POA: Diagnosis not present

## 2012-05-01 DIAGNOSIS — Z87891 Personal history of nicotine dependence: Secondary | ICD-10-CM | POA: Insufficient documentation

## 2012-05-01 HISTORY — DX: Colostomy status: Z93.3

## 2012-05-01 HISTORY — DX: Acute myocardial infarction, unspecified: I21.9

## 2012-05-01 HISTORY — DX: Chronic obstructive pulmonary disease, unspecified: J44.9

## 2012-05-01 MED ORDER — OXYCODONE-ACETAMINOPHEN 5-325 MG PO TABS: 1.0000 | ORAL_TABLET | ORAL | Status: DC | PRN

## 2012-05-01 MED ORDER — HYDROMORPHONE HCL PF 1 MG/ML IJ SOLN
1.0000 mg | Freq: Once | INTRAMUSCULAR | Status: AC
  Administered 2012-05-01: 1 mg via INTRAMUSCULAR
  Filled 2012-05-01: qty 1

## 2012-05-01 MED ORDER — ONDANSETRON HCL 4 MG/2ML IJ SOLN
4.0000 mg | Freq: Once | INTRAMUSCULAR | Status: DC
  Filled 2012-05-01: qty 2

## 2012-05-01 MED ORDER — ONDANSETRON HCL 4 MG/2ML IJ SOLN
4.0000 mg | Freq: Once | INTRAMUSCULAR | Status: AC
  Administered 2012-05-01: 4 mg via INTRAMUSCULAR

## 2012-05-01 NOTE — ED Notes (Signed)
Pt says tripped over his cane yesterday and c/o pain in lower back and left knee.

## 2012-05-01 NOTE — ED Provider Notes (Signed)
History   This chart was scribed for Carleene Cooper III, MD by Charolett Bumpers . The patient was seen in room APA17/APA17. Patient's care was started at 1358.   CSN: [id number]  Arrival date & time 05/01/12  1152   First MD Initiated Contact with Patient 05/01/12 1358      Chief Complaint  Patient presents with  . Back Pain  . Knee Pain    The history is provided by the patient. No language interpreter was used.  Herbert May is a 71 y.o. male who presents to the Emergency Department complaining of constant, moderate lower back pain and left knee pain after falling yesterday. He reports that he tripped over his 4 prong cane yesterday injuring his back and left knee. He denies hitting his head or LOC. 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 He also has had a back surgery. He reports a h/o HTN, DM, COPD, MI, two cardiac stents, partial colostomy and incisional hernia. He states that he recently moved to [place] from [place].  Past Medical History  Diagnosis Date  . Heart attack   . COPD (chronic obstructive pulmonary disease)   . Colostomy status     Past Surgical History  Procedure Date  . Colostomy   . Nose surgery   . Back surgery     No family history on file.  History  Substance Use Topics  . Smoking status: Former Games developer  . Smokeless tobacco: Not on file  . Alcohol Use: No      Review of Systems  Constitutional: Negative for fever and chills.  Respiratory: Negative for shortness of breath.   Gastrointestinal: Negative for nausea and vomiting.  Musculoskeletal: Positive for back pain and arthralgias.       Left knee pain.   Neurological: Negative for weakness.  All other systems reviewed and are negative.    Allergies  Fish allergy; Latex; Levaquin; Neosporin; Onion; and Other  Home Medications  No current outpatient prescriptions on file.  BP 102/65  Pulse 87  Temp 98.2 F (36.8 C) (Oral)  Resp 20  Ht 5\' 7"  (1.702  m)  Wt 207 lb (93.895 kg)  BMI 32.42 kg/m2  SpO2 97%  Physical Exam  Nursing note and vitals reviewed. Constitutional: He is oriented to person, place, and time. He appears well-developed and well-nourished. No distress.  HENT:  Head: Normocephalic and atraumatic.  Right Ear: External ear normal.  Left Ear: External ear normal.  Nose: Nose normal.  Mouth/Throat: Oropharynx is clear and moist. No oropharyngeal exudate.  Eyes: Conjunctivae normal and EOM are normal. Pupils are equal, round, and reactive to light.  Neck: Normal range of motion. Neck supple. No tracheal deviation present.       Post-operative scar on posterior neck from cervical disc surgery.   Cardiovascular: Normal rate, regular rhythm and normal heart sounds.   No murmur heard. Pulmonary/Chest: Effort normal and breath sounds normal. No respiratory distress. He has no wheezes.  Abdominal: Soft. Bowel sounds are normal. He exhibits no distension and no mass. There is no tenderness.       Long midline abdominal scar consistent with previous surgeries.   Musculoskeletal: Normal range of motion. He exhibits tenderness. He exhibits no edema.       Scar on lumbar spine consist with back surgery. Diffuse lumbar tenderness, but no point tenderness. Well healed scar on left knee consistent with knee surgery.  Lymphadenopathy:    He has no cervical adenopathy.  Neurological: He is alert and oriented to person, place, and time.  Skin: Skin is warm and dry.  Psychiatric: He has a normal mood and affect. His behavior is normal.    ED Course  Procedures (including critical care time)  DIAGNOSTIC STUDIES: Oxygen Saturation is 97% on room air, adequate by my interpretation.    COORDINATION OF CARE:  14:14-Discussed planned course of treatment with the patient including pain and nausea medication, who is agreeable at this time.   14:30-Medication Orders: Hydromorphone (Dilaudid) injection 1 mg-once; Ondansetron (Zofran)  injection 4 mg-once.    1. Low back pain    I personally performed the services described in this documentation, which was scribed in my presence. The recorded information has been reviewed and considered.  Osvaldo Human, MD      Carleene Cooper III, MD 05/01/12 [phone]

## 2012-05-01 NOTE — ED Notes (Signed)
Pt was at the atm machine using a 4 prong cane when he tripped over the cane, resulting in a fall, pt c/o pain to left knee area and lower back area. Dr. Ignacia Palma at bedside, update given to pt

## 2012-05-10 DIAGNOSIS — E785 Hyperlipidemia, unspecified: Secondary | ICD-10-CM | POA: Diagnosis not present

## 2012-05-10 DIAGNOSIS — M25519 Pain in unspecified shoulder: Secondary | ICD-10-CM | POA: Diagnosis not present

## 2012-05-10 DIAGNOSIS — E559 Vitamin D deficiency, unspecified: Secondary | ICD-10-CM | POA: Diagnosis not present

## 2012-05-10 DIAGNOSIS — R5381 Other malaise: Secondary | ICD-10-CM | POA: Diagnosis not present

## 2012-05-10 DIAGNOSIS — I10 Essential (primary) hypertension: Secondary | ICD-10-CM | POA: Diagnosis not present

## 2012-05-10 DIAGNOSIS — K219 Gastro-esophageal reflux disease without esophagitis: Secondary | ICD-10-CM | POA: Diagnosis not present

## 2012-05-25 DIAGNOSIS — L259 Unspecified contact dermatitis, unspecified cause: Secondary | ICD-10-CM | POA: Diagnosis not present

## 2012-05-31 DIAGNOSIS — M961 Postlaminectomy syndrome, not elsewhere classified: Secondary | ICD-10-CM | POA: Diagnosis not present

## 2012-06-01 DIAGNOSIS — J449 Chronic obstructive pulmonary disease, unspecified: Secondary | ICD-10-CM | POA: Diagnosis not present

## 2012-06-01 DIAGNOSIS — J45909 Unspecified asthma, uncomplicated: Secondary | ICD-10-CM | POA: Diagnosis not present

## 2012-06-16 DIAGNOSIS — M1710 Unilateral primary osteoarthritis, unspecified knee: Secondary | ICD-10-CM | POA: Diagnosis not present

## 2012-06-16 DIAGNOSIS — M25569 Pain in unspecified knee: Secondary | ICD-10-CM | POA: Diagnosis not present

## 2012-06-29 ENCOUNTER — Emergency Department (HOSPITAL_COMMUNITY): Payer: Medicare Other

## 2012-06-29 ENCOUNTER — Emergency Department (HOSPITAL_COMMUNITY)
Admission: EM | Admit: 2012-06-29 | Discharge: 2012-06-29 | Disposition: A | Discharge status: Home / Self Care | Payer: Medicare Other | Attending: Emergency Medicine | Admitting: Emergency Medicine

## 2012-06-29 ENCOUNTER — Encounter (HOSPITAL_COMMUNITY): Payer: Self-pay | Admitting: *Deleted

## 2012-06-29 DIAGNOSIS — Z79899 Other long term (current) drug therapy: Secondary | ICD-10-CM | POA: Diagnosis not present

## 2012-06-29 DIAGNOSIS — J449 Chronic obstructive pulmonary disease, unspecified: Secondary | ICD-10-CM | POA: Insufficient documentation

## 2012-06-29 DIAGNOSIS — Z933 Colostomy status: Secondary | ICD-10-CM | POA: Diagnosis not present

## 2012-06-29 DIAGNOSIS — J4489 Other specified chronic obstructive pulmonary disease: Secondary | ICD-10-CM | POA: Insufficient documentation

## 2012-06-29 DIAGNOSIS — E869 Volume depletion, unspecified: Secondary | ICD-10-CM

## 2012-06-29 DIAGNOSIS — R509 Fever, unspecified: Secondary | ICD-10-CM | POA: Diagnosis not present

## 2012-06-29 DIAGNOSIS — I951 Orthostatic hypotension: Secondary | ICD-10-CM | POA: Diagnosis not present

## 2012-06-29 DIAGNOSIS — Z8679 Personal history of other diseases of the circulatory system: Secondary | ICD-10-CM | POA: Insufficient documentation

## 2012-06-29 DIAGNOSIS — E860 Dehydration: Secondary | ICD-10-CM | POA: Diagnosis not present

## 2012-06-29 DIAGNOSIS — R269 Unspecified abnormalities of gait and mobility: Secondary | ICD-10-CM | POA: Insufficient documentation

## 2012-06-29 DIAGNOSIS — B338 Other specified viral diseases: Secondary | ICD-10-CM | POA: Diagnosis not present

## 2012-06-29 DIAGNOSIS — R42 Dizziness and giddiness: Secondary | ICD-10-CM | POA: Diagnosis not present

## 2012-06-29 DIAGNOSIS — Z87891 Personal history of nicotine dependence: Secondary | ICD-10-CM | POA: Diagnosis not present

## 2012-06-29 DIAGNOSIS — IMO0002 Reserved for concepts with insufficient information to code with codable children: Secondary | ICD-10-CM | POA: Diagnosis not present

## 2012-06-29 LAB — CBC
MCH: 22 pg — ABNORMAL LOW (ref 26.0–34.0)
MCHC: 30.3 g/dL (ref 30.0–36.0)
MCV: 72.7 fL — ABNORMAL LOW (ref 78.0–100.0)
Platelets: 292 10*3/uL (ref 150–400)
RBC: 5.72 MIL/uL (ref 4.22–5.81)
RDW: 22.5 % — ABNORMAL HIGH (ref 11.5–15.5)

## 2012-06-29 LAB — BASIC METABOLIC PANEL
BUN: 14 mg/dL (ref 6–23)
Glucose, Bld: 101 mg/dL — ABNORMAL HIGH (ref 70–99)
Sodium: 135 mEq/L (ref 135–145)

## 2012-06-29 MED ORDER — SODIUM CHLORIDE 0.9 % IV BOLUS (SEPSIS)
1000.0000 mL | Freq: Once | INTRAVENOUS | Status: AC
  Administered 2012-06-29: 1000 mL via INTRAVENOUS

## 2012-06-29 MED ORDER — OXYCODONE-ACETAMINOPHEN 5-325 MG PO TABS
1.0000 | ORAL_TABLET | Freq: Once | ORAL | Status: AC
  Administered 2012-06-29: 1 via ORAL
  Filled 2012-06-29: qty 1

## 2012-06-29 NOTE — ED Notes (Addendum)
Pt with c/o dizziness for 2-3 days, denies hx of same, denies N/V/D, states CBG this morning was 115

## 2012-06-29 NOTE — ED Notes (Signed)
MD at bedside. 

## 2012-06-29 NOTE — ED Provider Notes (Signed)
History   This chart was scribed for Lyanne Co, MD by Sofie Rower, ED Scribe. The patient was seen in room APA05/APA05 and the patient's care was started at 4:58PM.   CSN: [id number]  Arrival date & time 06/29/12  1600   First MD Initiated Contact with Patient 06/29/12 1658      Chief Complaint  Patient presents with  . Dizziness    (Consider location/radiation/quality/duration/timing/severity/associated sxs/prior treatment) The history is provided by the patient.    Herbert May is a 72 y.o. male , with a hx of MI and COPD, who presents to the Emergency Department complaining of sudden, progressively worsening, dizziness, onset three days ago (06/26/12).  Associated symptoms include light headedness. The pt reports he is experiencing intermittent dizzy spells, where he becomes light headed, and walks with a staggered gait. The pt informs he has been eating and drinking plenty of fluids recently. Modifying factors include certain movements and positions, specifically the transition from the seated to standing position which intensifies the dizziness.  The pt denies chest pain, shortness of breath, vomiting, and hematochezia.  The pt does not smoke or drink alcohol.   PCP is Dr. Christell Constant.   Past Medical History  Diagnosis Date  . Heart attack   . COPD (chronic obstructive pulmonary disease)   . Colostomy status     Past Surgical History  Procedure Date  . Colostomy   . Nose surgery   . Back surgery   . Hernia repair   . Knee surgery     History reviewed. No pertinent family history.  History  Substance Use Topics  . Smoking status: Former Games developer  . Smokeless tobacco: Not on file  . Alcohol Use: No      Review of Systems  10 Systems reviewed and all are negative for acute change except as noted in the HPI.    Allergies  Fish allergy; Latex; Onion; Other; Levaquin; and Neosporin  Home Medications   Current Outpatient Rx  Name  Route  Sig  Dispense   Refill  . OXYCODONE-ACETAMINOPHEN 5-325 MG PO TABS   Oral   Take 1 tablet by mouth every 4 (four) hours as needed for pain.   20 tablet   0     BP 112/84  Pulse 100  Temp 97.9 F (36.6 C) (Oral)  Resp 17  Ht 5\' 7"  (1.702 m)  Wt 205 lb (92.987 kg)  BMI 32.11 kg/m2  SpO2 95%  Physical Exam  Nursing note and vitals reviewed. Constitutional: He is oriented to person, place, and time. He appears well-developed and well-nourished.  HENT:  Head: Normocephalic and atraumatic.  Eyes: Pupils are equal, round, and reactive to light.  Cardiovascular: Regular rhythm.   Pulmonary/Chest: Effort normal.  Abdominal: Soft.  Neurological: He is alert and oriented to person, place, and time.       5/5 strength in major muscle groups of  bilateral upper and lower extremities. Speech normal. No facial asymetry.     ED Course  Procedures (including critical care time)  DIAGNOSTIC STUDIES: Oxygen Saturation is 95% on room air, normal by my interpretation.    COORDINATION OF CARE:  5:25 PM- Treatment plan discussed with patient. Pt agrees with treatment.     Results for orders placed during the hospital encounter of 06/29/12  CBC      Component Value Range   WBC 10.6 (*) 4.0 - 10.5 K/uL   RBC 5.72  4.22 - 5.81 MIL/uL   Hemoglobin 12.6 (*)  13.0 - 17.0 g/dL   HCT 78.2  95.6 - 21.3 %   MCV 72.7 (*) 78.0 - 100.0 fL   MCH 22.0 (*) 26.0 - 34.0 pg   MCHC 30.3  30.0 - 36.0 g/dL   RDW 08.6 (*) 57.8 - 46.9 %   Platelets 292  150 - 400 K/uL  BASIC METABOLIC PANEL      Component Value Range   Sodium 135  135 - 145 mEq/L   Potassium 4.2  3.5 - 5.1 mEq/L   Chloride 98  96 - 112 mEq/L   CO2 27  19 - 32 mEq/L   Glucose, Bld 101 (*) 70 - 99 mg/dL   BUN 14  6 - 23 mg/dL   Creatinine, Ser 6.29  0.50 - 1.35 mg/dL   Calcium 9.4  8.4 - 52.8 mg/dL   GFR calc non Af Amer 74 (*) >90 mL/min   GFR calc Af Amer 85 (*) >90 mL/min   Ct Head Wo Contrast  06/29/2012  *RADIOLOGY REPORT*  Clinical Data:  Dizzy, lightheaded  CT HEAD WITHOUT CONTRAST  Technique:  Contiguous axial images were obtained from the base of the skull through the vertex without contrast.  Comparison: None.  Findings: No acute intracranial hemorrhage, acute infarction, mass lesion, mass effect, hydrocephalus or midline shift.  Focal encephalomalacia in the inferior right frontal lobe.  The globes are intact.  Normal aeration of the paranasal sinuses and mastoid air cells.  Hypoplastic left mastoid air cells noted incidentally. Scattered atherosclerotic vascular calcifications in the cavernous portion of the bilateral internal carotid arteries.  No acute calvarial abnormality.  IMPRESSION:  1.  No acute intracranial abnormality. 2.  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 3.  Intracranial atherosclerosis.   Original Report Authenticated By: Malachy Moan, M.D.    I personally reviewed the imaging tests through PACS system I reviewed available ER/hospitalization records through the EMR   1. Volume depletion       MDM  The patient feels much better after IV fluids.  His neurologic exam is normal.  His labs and head CT are normal.  My suspicion for central vertigo is very low.  Discharge home in good condition.  Close followup with his primary care physician.  When a student at the side of bed he became lightheaded.  Clinically he had some degree of orthostasis.  He can stand at the bed side without any difficulty after 1 L of fluid and oral hydration      I personally performed the services described in this documentation, which was scribed in my presence. The recorded information has been reviewed and is accurate.      Lyanne Co, MD 06/29/12 [phone]

## 2012-06-29 NOTE — ED Notes (Signed)
Dizzy for 2-3 days ,lt leg "gives way".  Rt leg swelling.  Back pain

## 2012-06-30 ENCOUNTER — Emergency Department (HOSPITAL_COMMUNITY)
Admission: EM | Admit: 2012-06-30 | Discharge: 2012-06-30 | Disposition: A | Discharge status: Home / Self Care | Payer: Medicare Other | Attending: Emergency Medicine | Admitting: Emergency Medicine

## 2012-06-30 ENCOUNTER — Emergency Department (HOSPITAL_COMMUNITY): Payer: Medicare Other

## 2012-06-30 ENCOUNTER — Encounter (HOSPITAL_COMMUNITY): Payer: Self-pay | Admitting: *Deleted

## 2012-06-30 DIAGNOSIS — B338 Other specified viral diseases: Secondary | ICD-10-CM | POA: Diagnosis not present

## 2012-06-30 DIAGNOSIS — R059 Cough, unspecified: Secondary | ICD-10-CM | POA: Insufficient documentation

## 2012-06-30 DIAGNOSIS — B9789 Other viral agents as the cause of diseases classified elsewhere: Secondary | ICD-10-CM | POA: Insufficient documentation

## 2012-06-30 DIAGNOSIS — R51 Headache: Secondary | ICD-10-CM | POA: Diagnosis not present

## 2012-06-30 DIAGNOSIS — E860 Dehydration: Secondary | ICD-10-CM

## 2012-06-30 DIAGNOSIS — J3489 Other specified disorders of nose and nasal sinuses: Secondary | ICD-10-CM | POA: Insufficient documentation

## 2012-06-30 DIAGNOSIS — J449 Chronic obstructive pulmonary disease, unspecified: Secondary | ICD-10-CM | POA: Insufficient documentation

## 2012-06-30 DIAGNOSIS — Z8674 Personal history of sudden cardiac arrest: Secondary | ICD-10-CM | POA: Diagnosis not present

## 2012-06-30 DIAGNOSIS — I951 Orthostatic hypotension: Secondary | ICD-10-CM | POA: Insufficient documentation

## 2012-06-30 DIAGNOSIS — Z87891 Personal history of nicotine dependence: Secondary | ICD-10-CM | POA: Insufficient documentation

## 2012-06-30 DIAGNOSIS — Z794 Long term (current) use of insulin: Secondary | ICD-10-CM | POA: Insufficient documentation

## 2012-06-30 DIAGNOSIS — R350 Frequency of micturition: Secondary | ICD-10-CM | POA: Insufficient documentation

## 2012-06-30 DIAGNOSIS — Z79899 Other long term (current) drug therapy: Secondary | ICD-10-CM | POA: Insufficient documentation

## 2012-06-30 DIAGNOSIS — M549 Dorsalgia, unspecified: Secondary | ICD-10-CM | POA: Insufficient documentation

## 2012-06-30 DIAGNOSIS — M7989 Other specified soft tissue disorders: Secondary | ICD-10-CM | POA: Insufficient documentation

## 2012-06-30 DIAGNOSIS — R05 Cough: Secondary | ICD-10-CM | POA: Insufficient documentation

## 2012-06-30 DIAGNOSIS — J4489 Other specified chronic obstructive pulmonary disease: Secondary | ICD-10-CM | POA: Insufficient documentation

## 2012-06-30 DIAGNOSIS — J029 Acute pharyngitis, unspecified: Secondary | ICD-10-CM | POA: Diagnosis not present

## 2012-06-30 DIAGNOSIS — R509 Fever, unspecified: Secondary | ICD-10-CM | POA: Insufficient documentation

## 2012-06-30 DIAGNOSIS — R0602 Shortness of breath: Secondary | ICD-10-CM | POA: Insufficient documentation

## 2012-06-30 DIAGNOSIS — B349 Viral infection, unspecified: Secondary | ICD-10-CM

## 2012-06-30 LAB — COMPREHENSIVE METABOLIC PANEL
ALT: 11 U/L (ref 0–53)
Alkaline Phosphatase: 72 U/L (ref 39–117)
CO2: 29 mEq/L (ref 19–32)
Calcium: 9.2 mg/dL (ref 8.4–10.5)
Chloride: 99 mEq/L (ref 96–112)
GFR calc Af Amer: 71 mL/min — ABNORMAL LOW (ref >90)
GFR calc non Af Amer: 61 mL/min — ABNORMAL LOW (ref >90)
Glucose, Bld: 71 mg/dL (ref 70–99)
Sodium: 138 mEq/L (ref 135–145)
Total Bilirubin: 0.2 mg/dL — ABNORMAL LOW (ref 0.3–1.2)

## 2012-06-30 LAB — URINALYSIS, ROUTINE W REFLEX MICROSCOPIC
Bilirubin Urine: NEGATIVE
Hgb urine dipstick: NEGATIVE
Nitrite: NEGATIVE
Specific Gravity, Urine: 1.015 (ref 1.005–1.030)
pH: 6 (ref 5.0–8.0)

## 2012-06-30 LAB — CBC WITH DIFFERENTIAL/PLATELET
Eosinophils Absolute: 0.1 10*3/uL (ref 0.0–0.7)
Hemoglobin: 12.9 g/dL — ABNORMAL LOW (ref 13.0–17.0)
Lymphocytes Relative: 19 % (ref 12–46)
Lymphs Abs: 1.6 10*3/uL (ref 0.7–4.0)
MCH: 22.5 pg — ABNORMAL LOW (ref 26.0–34.0)
MCV: 73.9 fL — ABNORMAL LOW (ref 78.0–100.0)
Monocytes Relative: 10 % (ref 3–12)
Neutrophils Relative %: 70 % (ref 43–77)
RBC: 5.74 MIL/uL (ref 4.22–5.81)

## 2012-06-30 LAB — INFLUENZA PANEL BY PCR (TYPE A & B): Influenza B By PCR: NEGATIVE

## 2012-06-30 MED ORDER — OXYCODONE-ACETAMINOPHEN 5-325 MG PO TABS
2.0000 | ORAL_TABLET | Freq: Once | ORAL | Status: AC
  Administered 2012-06-30: 2 via ORAL
  Filled 2012-06-30: qty 2

## 2012-06-30 MED ORDER — SODIUM CHLORIDE 0.9 % IV SOLN
1000.0000 mL | INTRAVENOUS | Status: DC
  Administered 2012-06-30: 1000 mL via INTRAVENOUS

## 2012-06-30 MED ORDER — SODIUM CHLORIDE 0.9 % IV SOLN
1000.0000 mL | Freq: Once | INTRAVENOUS | Status: AC
  Administered 2012-06-30: 1000 mL via INTRAVENOUS

## 2012-06-30 NOTE — ED Notes (Signed)
Patient with no complaints at this time. Respirations even and unlabored. Skin warm/dry. Discharge instructions reviewed with patient at this time. Patient given opportunity to voice concerns/ask questions. IV removed per policy and band-aid applied to site. Patient discharged at this time and left Emergency Department with steady gait.  

## 2012-06-30 NOTE — ED Notes (Signed)
Dr Lynelle Doctor informed of orthostatic vs and that patient states he wants to go home. Dr Lynelle Doctor states she will work on patient discharge.

## 2012-06-30 NOTE — ED Provider Notes (Signed)
History  This chart was scribed for Ward Givens, MD by Bennett Scrape, ED Scribe. This patient was seen in room APA14/APA14 and the patient's care was started at 3:23 PM.  CSN: [id number]  Arrival date & time 06/30/12  1452   First MD Initiated Contact with Patient 06/30/12 1523      Chief Complaint  Patient presents with  . Fever    Patient is a 72 y.o. male presenting with fever. The history is provided by the patient. No language interpreter was used.  Fever Primary symptoms of the febrile illness include fever, headaches, cough and shortness of breath (chronic, no changes). Primary symptoms do not include abdominal pain, nausea, vomiting, diarrhea or dysuria. The current episode started 3 to 5 days ago. This is a new problem. The problem has been gradually worsening.    Herbert May is a 72 y.o. male who presents to the Emergency Department complaining of fever of 101.5 with associated 3 days of HA located into the right frontal region, mild non-productive cough, rhinorrhea described as white, sore throat, chills and fatigue. Fever was measured 100.5 in the ED. He states that these symptoms are similar to prior cold-like symptoms that he has experienced in the past and states he gets this headache when he gets URI's. He denies having any sick contacts with similar symptoms. He also c/o right leg swelling that he states occurs occasionally and he denies having any prior surgeries on the right leg. He reports that he has chronic SOB from COPD, urinary frequency from being on lasix and chronic back pain but denies any recent changes. He denies new SOB, nausea, emesis, diarrhea, CP and abdominal pain, or urinary symptoms as associated symptoms. He also reports "staggering all over the place" for the past 3-4 days and didn't mention he was in the ED for this symptoms yesterday.     Quit smoking and drinking 22 years ago. He states that he did receive a flu shot this year.  Dr. Lenise Arena at  [facility] is PCP.  He was seen in the ED yesterday for dizziness and was diagnosed with volume depletion. He had a negative CT scan of the head and was discharged after symptoms improved with fluids.  Past Medical History  Diagnosis Date  . Heart attack   . COPD (chronic obstructive pulmonary disease)   . Colostomy status     Past Surgical History  Procedure Date  . Colostomy   . Nose surgery   . Back surgery   . Hernia repair   . Knee surgery     History reviewed. No pertinent family history.  History  Substance Use Topics  . Smoking status: Former Games developer  . Smokeless tobacco: Not on file  . Alcohol Use: No   Lives with wife at home Lives at home Uses a cane b/o his left leg (no patella)   Review of Systems  Constitutional: Positive for fever.  HENT: Positive for sore throat and rhinorrhea. Negative for congestion.   Respiratory: Positive for cough and shortness of breath (chronic, no changes).   Cardiovascular: Positive for leg swelling (mild right leg). Negative for chest pain.  Gastrointestinal: Negative for nausea, vomiting, abdominal pain and diarrhea.  Genitourinary: Positive for frequency (chronic, no changes). Negative for dysuria, urgency and hematuria.  Musculoskeletal: Positive for back pain (chronic, no changes).  Neurological: Positive for headaches.  All other systems reviewed and are negative.    Allergies  Aspirin; Fish allergy; Ibuprofen; Latex; Onion; Other; Levaquin;  and Neosporin  Home Medications   Current Outpatient Rx  Name  Route  Sig  Dispense  Refill  . ALBUTEROL SULFATE HFA 108 (90 BASE) MCG/ACT IN AERS   Inhalation   Inhale 2 puffs into the lungs 4 (four) times daily.         . ALBUTEROL SULFATE (2.5 MG/3ML) 0.083% IN NEBU   Nebulization   Take 2.5 mg by nebulization every 6 (six) hours as needed.         . BUDESONIDE-FORMOTEROL FUMARATE 160-4.5 MCG/ACT IN AERO   Inhalation   Inhale 2 puffs into the lungs  daily.         Marland Kitchen DIAZEPAM 5 MG PO TABS   Oral   Take 5 mg by mouth every 6 (six) hours as needed. Sleep         . DICLOFENAC POTASSIUM 25 MG PO CAPS   Oral   Take 25 mg by mouth daily.         . FENTANYL 75 MCG/HR TD PT72   Transdermal   Place 1 patch onto the skin every 3 (three) days.         Marland Kitchen FLUTICASONE PROPIONATE  HFA 110 MCG/ACT IN AERO   Inhalation   Inhale 1 puff into the lungs 2 (two) times daily.         . FUROSEMIDE 40 MG PO TABS   Oral   Take 40 mg by mouth daily.         Marland Kitchen GABAPENTIN 300 MG PO CAPS   Oral   Take 300 mg by mouth 3 (three) times daily.         . INSULIN DETEMIR 100 UNIT/ML [place] SOLN   Subcutaneous   Inject 60 Units into the skin 2 (two) times daily.         . IPRATROPIUM BROMIDE 0.02 % IN SOLN   Nebulization   Take 500 mcg by nebulization 4 (four) times daily.         Marland Kitchen METFORMIN HCL 500 MG PO TABS   Oral   Take 500 mg by mouth 2 (two) times daily with a meal.         . OXYCODONE-ACETAMINOPHEN 10-325 MG PO TABS   Oral   Take 1 tablet by mouth 4 (four) times daily.         Marland Kitchen PANTOPRAZOLE SODIUM 40 MG PO TBEC   Oral   Take 40 mg by mouth daily.         Marland Kitchen POTASSIUM CHLORIDE CRYS ER 20 MEQ PO TBCR   Oral   Take 20 mEq by mouth daily.         Marland Kitchen SIMVASTATIN 40 MG PO TABS   Oral   Take 40 mg by mouth every evening.         Marland Kitchen SPIRONOLACTONE 25 MG PO TABS   Oral   Take 25 mg by mouth 2 (two) times daily.         Marland Kitchen TEMAZEPAM 7.5 MG PO CAPS   Oral   Take 7.5 mg by mouth at bedtime.         Marland Kitchen TIOTROPIUM BROMIDE MONOHYDRATE 18 MCG IN CAPS   Inhalation   Place 18 mcg into inhaler and inhale daily.         . TRAZODONE HCL 100 MG PO TABS   Oral   Take 100 mg by mouth at bedtime as needed. Sleep         . VITAMIN D (  ERGOCALCIFEROL) 50000 UNITS PO CAPS   Oral   Take 50,000 Units by mouth every 7 (seven) days. Saturday           Triage Vitals: BP 97/72  Pulse 101  Temp 100.2 F (37.9 C) (Oral)   Resp 21  Ht 5\' 7"  (1.702 m)  Wt 205 lb (92.987 kg)  BMI 32.11 kg/m2  SpO2 93%  Vital signs normal borderline hypertension and tachycardia   Physical Exam  Nursing note and vitals reviewed. Constitutional: He is oriented to person, place, and time. He appears well-developed and well-nourished.  Non-toxic appearance. He does not appear ill. No distress.  HENT:  Head: Normocephalic and atraumatic.  Right Ear: External ear normal.  Left Ear: External ear normal.  Nose: Nose normal. No mucosal edema or rhinorrhea.  Mouth/Throat: Oropharynx is clear and moist. Mucous membranes are dry. No dental abscesses or uvula swelling.       Tongue and lips are dry  Eyes: Conjunctivae normal and EOM are normal. Pupils are equal, round, and reactive to light.  Neck: Normal range of motion and full passive range of motion without pain. Neck supple.  Cardiovascular: Normal rate, regular rhythm and normal heart sounds.  Exam reveals no gallop and no friction rub.   No murmur heard. Pulmonary/Chest: Effort normal. No respiratory distress. He has no wheezes. He has no rhonchi. He has no rales. He exhibits no tenderness and no crepitus.       Very diminished breath sounds diffusely   Abdominal: Soft. Normal appearance and bowel sounds are normal. He exhibits no distension. There is no tenderness. There is no rebound and no guarding.  Musculoskeletal: Normal range of motion. He exhibits edema (trace edema in the right leg). He exhibits no tenderness.       Moves all extremities well.   Neurological: He is alert and oriented to person, place, and time. He has normal strength. No cranial nerve deficit.  Skin: Skin is warm, dry and intact. No rash noted. No erythema. No pallor.  Psychiatric: He has a normal mood and affect. His speech is normal and behavior is normal. His mood appears not anxious.    ED Course  Procedures (including critical care time)   Medications  0.9 %  sodium chloride infusion (0 mL  Intravenous Stopped 06/30/12 1704)    Followed by  0.9 %  sodium chloride infusion (1000 mL Intravenous New Bag/Given 06/30/12 1704)  albuterol (PROAIR HFA) 108 (90 BASE) MCG/ACT inhaler (not administered)  oxyCODONE-acetaminophen (PERCOCET/ROXICET) 5-325 MG per tablet 2 tablet (2 tablet Oral Given 06/30/12 1731)     DIAGNOSTIC STUDIES: Oxygen Saturation is 93% on room air, adequate by my interpretation.    COORDINATION OF CARE: 3:41 PM- Advised pt that his symptoms could be from influenza or PNA. Discussed treatment plan which includes rapid strep, influenza panel, CXR, CBC panel and UA with pt at bedside and pt agreed to plan.  5:31 PM- Pt rechecked and informed of CXR and lab results. Advised pt that I am still awaiting his influenza test results.  6:10 PM- Pt rechecked and states that he feels improved with fluids. Informed pt of all lab results. Discussed discharge plan with pt and pt agreed. I tried to talk to patient about fluids to drink at home to prevent dehydration however he is not receptive to this device. Patient was here yesterday and received 1 L of IV fluids and presented today appearing dehydrated and again got another liter of fluid.  Patient's  orthostatic she she becomes hypotensive when he sits up however when he stands up his blood pressure stabilizes. This is patient's second ED visit in 2 days for apparent dehydration. I reviewed his medication list he is on 2 diuretics. I will have him followup with his family physician to see if he should stop one of his diuretics.  Results for orders placed during the hospital encounter of 06/30/12  URINALYSIS, ROUTINE W REFLEX MICROSCOPIC      Component Value Range   Color, Urine YELLOW  YELLOW   APPearance CLEAR  CLEAR   Specific Gravity, Urine 1.015  1.005 - 1.030   pH 6.0  5.0 - 8.0   Glucose, UA NEGATIVE  NEGATIVE mg/dL   Hgb urine dipstick NEGATIVE  NEGATIVE   Bilirubin Urine NEGATIVE  NEGATIVE   Ketones, ur NEGATIVE  NEGATIVE  mg/dL   Protein, ur NEGATIVE  NEGATIVE mg/dL   Urobilinogen, UA 0.2  0.0 - 1.0 mg/dL   Nitrite NEGATIVE  NEGATIVE   Leukocytes, UA NEGATIVE  NEGATIVE  CBC WITH DIFFERENTIAL      Component Value Range   WBC 8.5  4.0 - 10.5 K/uL   RBC 5.74  4.22 - 5.81 MIL/uL   Hemoglobin 12.9 (*) 13.0 - 17.0 g/dL   HCT 16.1  09.6 - 04.5 %   MCV 73.9 (*) 78.0 - 100.0 fL   MCH 22.5 (*) 26.0 - 34.0 pg   MCHC 30.4  30.0 - 36.0 g/dL   RDW 40.9 (*) 81.1 - 91.4 %   Platelets 282  150 - 400 K/uL   Neutrophils Relative 70  43 - 77 %   Neutro Abs 5.9  1.7 - 7.7 K/uL   Lymphocytes Relative 19  12 - 46 %   Lymphs Abs 1.6  0.7 - 4.0 K/uL   Monocytes Relative 10  3 - 12 %   Monocytes Absolute 0.9  0.1 - 1.0 K/uL   Eosinophils Relative 1  0 - 5 %   Eosinophils Absolute 0.1  0.0 - 0.7 K/uL   Basophils Relative 0  0 - 1 %   Basophils Absolute 0.0  0.0 - 0.1 K/uL   RBC Morphology ELLIPTOCYTES    COMPREHENSIVE METABOLIC PANEL      Component Value Range   Sodium 138  135 - 145 mEq/L   Potassium 4.5  3.5 - 5.1 mEq/L   Chloride 99  96 - 112 mEq/L   CO2 29  19 - 32 mEq/L   Glucose, Bld 71  70 - 99 mg/dL   BUN 12  6 - 23 mg/dL   Creatinine, Ser 7.82  0.50 - 1.35 mg/dL   Calcium 9.2  8.4 - 95.6 mg/dL   Total Protein 7.1  6.0 - 8.3 g/dL   Albumin 3.7  3.5 - 5.2 g/dL   AST 17  0 - 37 U/L   ALT 11  0 - 53 U/L   Alkaline Phosphatase 72  39 - 117 U/L   Total Bilirubin 0.2 (*) 0.3 - 1.2 mg/dL   GFR calc non Af Amer 61 (*) >90 mL/min   GFR calc Af Amer 71 (*) >90 mL/min  RAPID STREP SCREEN      Component Value Range   Streptococcus, Group A Screen (Direct) NEGATIVE  NEGATIVE  INFLUENZA PANEL BY PCR      Component Value Range   Influenza A By PCR NEGATIVE  NEGATIVE   Influenza B By PCR NEGATIVE  NEGATIVE   H1N1 flu by  pcr NOT DETECTED  NOT DETECTED   Laboratory interpretation all normal   Dg Chest 2 View  06/30/2012  *RADIOLOGY REPORT*  Clinical Data: Fever.  Weakness.  CHEST - 2 VIEW  Comparison: Chest x-ray  03/08/2012.  Findings: Lung volumes are normal.  No consolidative airspace disease.  No pleural effusions.  Pulmonary vasculature and the cardiomediastinal silhouette are within normal limits. Atherosclerosis in the thoracic aorta.  Mild bilateral pleural thickening is unchanged compared to prior examinations.  IMPRESSION: 1.  No radiographic evidence of acute cardiopulmonary disease. 2.  Atherosclerosis.   Original Report Authenticated By: Trudie Reed, M.D.    Ct Head Wo Contrast  06/29/2012  *RADIOLOGY REPORT*  Clinical Data: Dizzy, lightheaded  CT HEAD WITHOUT CONTRAST  Technique:  Contiguous axial images were obtained from the base of the skull through the vertex without contrast.  Comparison: None.  Findings: No acute intracranial hemorrhage, acute infarction, mass lesion, mass effect, hydrocephalus or midline shift.  Focal encephalomalacia in the inferior right frontal lobe.  The globes are intact.  Normal aeration of the paranasal sinuses and mastoid air cells.  Hypoplastic left mastoid air cells noted incidentally. Scattered atherosclerotic vascular calcifications in the cavernous portion of the bilateral internal carotid arteries.  No acute calvarial abnormality.  IMPRESSION:  1.  No acute intracranial abnormality. 2.  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 3.  Intracranial atherosclerosis.   Original Report Authenticated By: Malachy Moan, M.D.       1. Fever   2. Viral illness   3. Orthostatic hypotension   4. Dehydration    Plan discharge  Devoria Albe, MD, FACEP     MDM   I personally performed the services described in this documentation, which was scribed in my presence. The recorded information has been reviewed and considered.  Devoria Albe, MD, Armando Gang    Ward Givens, MD 06/30/12 [phone]

## 2012-06-30 NOTE — ED Notes (Signed)
Patient c/o back pain, chronic. Dr Lynelle Doctor aware. Verbal order for 2 tabs PO Percocet 5-325mg  obtained.

## 2012-06-30 NOTE — ED Notes (Signed)
Fever, cough, weakness. Hoarse.No NVD

## 2012-07-18 ENCOUNTER — Emergency Department (HOSPITAL_COMMUNITY)
Admission: EM | Admit: 2012-07-18 | Discharge: 2012-07-18 | Disposition: A | Discharge status: Home / Self Care | Payer: Medicare Other | Attending: Emergency Medicine | Admitting: Emergency Medicine

## 2012-07-18 ENCOUNTER — Encounter (HOSPITAL_COMMUNITY): Payer: Self-pay | Admitting: *Deleted

## 2012-07-18 ENCOUNTER — Emergency Department (HOSPITAL_COMMUNITY): Payer: Medicare Other

## 2012-07-18 DIAGNOSIS — R0602 Shortness of breath: Secondary | ICD-10-CM | POA: Diagnosis not present

## 2012-07-18 DIAGNOSIS — R51 Headache: Secondary | ICD-10-CM | POA: Insufficient documentation

## 2012-07-18 DIAGNOSIS — J449 Chronic obstructive pulmonary disease, unspecified: Secondary | ICD-10-CM | POA: Diagnosis not present

## 2012-07-18 DIAGNOSIS — R042 Hemoptysis: Secondary | ICD-10-CM | POA: Diagnosis not present

## 2012-07-18 DIAGNOSIS — J4489 Other specified chronic obstructive pulmonary disease: Secondary | ICD-10-CM | POA: Insufficient documentation

## 2012-07-18 DIAGNOSIS — Z933 Colostomy status: Secondary | ICD-10-CM | POA: Diagnosis not present

## 2012-07-18 DIAGNOSIS — J984 Other disorders of lung: Secondary | ICD-10-CM | POA: Diagnosis not present

## 2012-07-18 DIAGNOSIS — IMO0001 Reserved for inherently not codable concepts without codable children: Secondary | ICD-10-CM | POA: Insufficient documentation

## 2012-07-18 DIAGNOSIS — Z87891 Personal history of nicotine dependence: Secondary | ICD-10-CM | POA: Diagnosis not present

## 2012-07-18 DIAGNOSIS — Z79899 Other long term (current) drug therapy: Secondary | ICD-10-CM | POA: Insufficient documentation

## 2012-07-18 DIAGNOSIS — I252 Old myocardial infarction: Secondary | ICD-10-CM | POA: Diagnosis not present

## 2012-07-18 DIAGNOSIS — R110 Nausea: Secondary | ICD-10-CM | POA: Insufficient documentation

## 2012-07-18 DIAGNOSIS — Z794 Long term (current) use of insulin: Secondary | ICD-10-CM | POA: Diagnosis not present

## 2012-07-18 DIAGNOSIS — J441 Chronic obstructive pulmonary disease with (acute) exacerbation: Secondary | ICD-10-CM | POA: Diagnosis not present

## 2012-07-18 LAB — CBC WITH DIFFERENTIAL/PLATELET
Basophils Absolute: 0 10*3/uL (ref 0.0–0.1)
Basophils Relative: 0 % (ref 0–1)
Eosinophils Absolute: 0.1 10*3/uL (ref 0.0–0.7)
Eosinophils Relative: 1 % (ref 0–5)
HCT: 39.6 % (ref 39.0–52.0)
Hemoglobin: 12.2 g/dL — ABNORMAL LOW (ref 13.0–17.0)
Lymphocytes Relative: 14 % (ref 12–46)
Lymphs Abs: 1.8 10*3/uL (ref 0.7–4.0)
MCH: 23.6 pg — ABNORMAL LOW (ref 26.0–34.0)
MCHC: 30.8 g/dL (ref 30.0–36.0)
MCV: 76.4 fL — ABNORMAL LOW (ref 78.0–100.0)
Monocytes Absolute: 0.9 10*3/uL (ref 0.1–1.0)
Monocytes Relative: 7 % (ref 3–12)
Neutro Abs: 10.4 10*3/uL — ABNORMAL HIGH (ref 1.7–7.7)
Neutrophils Relative %: 78 % — ABNORMAL HIGH (ref 43–77)
Platelets: 352 10*3/uL (ref 150–400)
RBC: 5.18 MIL/uL (ref 4.22–5.81)
RDW: 21.7 % — ABNORMAL HIGH (ref 11.5–15.5)
WBC: 13.2 10*3/uL — ABNORMAL HIGH (ref 4.0–10.5)

## 2012-07-18 LAB — BASIC METABOLIC PANEL
BUN: 8 mg/dL (ref 6–23)
CO2: 29 mEq/L (ref 19–32)
Calcium: 9.2 mg/dL (ref 8.4–10.5)
Chloride: 103 mEq/L (ref 96–112)
Creatinine, Ser: 0.88 mg/dL (ref 0.50–1.35)
GFR calc Af Amer: >90 mL/min (ref >90)
GFR calc non Af Amer: 84 mL/min — ABNORMAL LOW (ref >90)
Glucose, Bld: 79 mg/dL (ref 70–99)
Potassium: 3.7 mEq/L (ref 3.5–5.1)
Sodium: 142 mEq/L (ref 135–145)

## 2012-07-18 LAB — TROPONIN I: Troponin I: <0.3 ng/mL (ref <0.30)

## 2012-07-18 LAB — PRO B NATRIURETIC PEPTIDE: Pro B Natriuretic peptide (BNP): 126.7 pg/mL — ABNORMAL HIGH (ref 0–125)

## 2012-07-18 MED ORDER — OXYCODONE-ACETAMINOPHEN 5-325 MG PO TABS
2.0000 | ORAL_TABLET | Freq: Once | ORAL | Status: AC
  Administered 2012-07-18: 2 via ORAL
  Filled 2012-07-18: qty 2

## 2012-07-18 MED ORDER — ALBUTEROL SULFATE (5 MG/ML) 0.5% IN NEBU
5.0000 mg | INHALATION_SOLUTION | Freq: Once | RESPIRATORY_TRACT | Status: AC
Start: 2012-07-18 — End: 2012-07-18
  Administered 2012-07-18: 5 mg via RESPIRATORY_TRACT
  Filled 2012-07-18: qty 1

## 2012-07-18 MED ORDER — IPRATROPIUM BROMIDE 0.02 % IN SOLN
0.5000 mg | Freq: Once | RESPIRATORY_TRACT | Status: AC
  Administered 2012-07-18: 0.5 mg via RESPIRATORY_TRACT
  Filled 2012-07-18: qty 2.5

## 2012-07-18 MED ORDER — GUAIFENESIN-CODEINE 100-10 MG/5ML PO SOLN
10.0000 mL | Freq: Four times a day (QID) | ORAL | Status: DC | PRN
  Administered 2012-07-18: 10 mL via ORAL
  Filled 2012-07-18: qty 10

## 2012-07-18 MED ORDER — PREDNISONE 50 MG PO TABS
60.0000 mg | ORAL_TABLET | Freq: Once | ORAL | Status: AC
  Administered 2012-07-18: 60 mg via ORAL
  Filled 2012-07-18: qty 1

## 2012-07-18 MED ORDER — PREDNISONE 20 MG PO TABS: 40.0000 mg | ORAL_TABLET | Freq: Every day | ORAL | Status: DC

## 2012-07-18 MED ORDER — AZITHROMYCIN 250 MG PO TABS: 250.0000 mg | ORAL_TABLET | Freq: Every day | ORAL | Status: DC

## 2012-07-18 NOTE — ED Notes (Signed)
MD at bedside. 

## 2012-07-18 NOTE — ED Provider Notes (Signed)
History    This chart was scribed for Herbert Razor, MD, MD by [name], ED Scribe. The patient was seen in room APA18 and the patient's care was started at 3:06 PM.   CSN: [id number]  Arrival date & time 07/18/12  1342      Chief Complaint  Patient presents with  . Cough     The history is provided by the patient. No language interpreter was used.   Herbert May is a 72 y.o. male with hx of COPD who presents to the Emergency Department complaining of constant, moderate cough onset 4 days ago. Pt reports he has SOB that had gotten worse. He reports having moderate headache, being shaky, nausea and hemoptysis. He states having general body aches. He denies fever, chills, vomiting, diarrhea, abdominal pain, rash, back pain, blood in stool, using blood thinning medication and any other pain. Pt states that he has decreased appetite. He mentions he has swelling of his legs bilaterally but this is baseline. Pt has hx of stints and MI.    Past Medical History  Diagnosis Date  . Heart attack   . COPD (chronic obstructive pulmonary disease)   . Colostomy status     Past Surgical History  Procedure Date  . Colostomy   . Nose surgery   . Back surgery   . Hernia repair   . Knee surgery   . Cardiac stents     History reviewed. No pertinent family history.  History  Substance Use Topics  . Smoking status: Former Games developer  . Smokeless tobacco: Not on file  . Alcohol Use: No      Review of Systems  All other systems reviewed and are negative.    Allergies  Aspirin; Fish allergy; Ibuprofen; Latex; Onion; Other; Levaquin; and Neosporin  Home Medications   Current Outpatient Rx  Name  Route  Sig  Dispense  Refill  . ALBUTEROL SULFATE HFA 108 (90 BASE) MCG/ACT IN AERS   Inhalation   Inhale 2 puffs into the lungs 4 (four) times daily.         . ALBUTEROL SULFATE (2.5 MG/3ML) 0.083% IN NEBU   Nebulization   Take 2.5 mg by nebulization every 4 (four) hours as  needed.          . BUDESONIDE-FORMOTEROL FUMARATE 160-4.5 MCG/ACT IN AERO   Inhalation   Inhale 2 puffs into the lungs daily.         Marland Kitchen DIAZEPAM 5 MG PO TABS   Oral   Take 5 mg by mouth at bedtime. Sleep         . DICLOFENAC POTASSIUM 25 MG PO CAPS   Oral   Take 25 mg by mouth daily.         . FENTANYL 75 MCG/HR TD PT72   Transdermal   Place 1 patch onto the skin every 3 (three) days.         Marland Kitchen FLUTICASONE PROPIONATE  HFA 110 MCG/ACT IN AERO   Inhalation   Inhale 1 puff into the lungs 2 (two) times daily.         . FUROSEMIDE 40 MG PO TABS   Oral   Take 40 mg by mouth daily.         Marland Kitchen GABAPENTIN 300 MG PO CAPS   Oral   Take 300 mg by mouth 3 (three) times daily.         . INSULIN DETEMIR 100 UNIT/ML [place] SOLN   Subcutaneous   Inject  60 Units into the skin 2 (two) times daily.         . IPRATROPIUM BROMIDE 0.02 % IN SOLN   Nebulization   Take 500 mcg by nebulization 4 (four) times daily.         Marland Kitchen METFORMIN HCL 500 MG PO TABS   Oral   Take 500 mg by mouth 2 (two) times daily with a meal.         . OXYCODONE-ACETAMINOPHEN 10-325 MG PO TABS   Oral   Take 1 tablet by mouth 4 (four) times daily.         Marland Kitchen PANTOPRAZOLE SODIUM 40 MG PO TBEC   Oral   Take 40 mg by mouth daily.         Marland Kitchen POTASSIUM CHLORIDE CRYS ER 20 MEQ PO TBCR   Oral   Take 20 mEq by mouth daily.         Marland Kitchen SIMVASTATIN 40 MG PO TABS   Oral   Take 40 mg by mouth every evening.         Marland Kitchen SPIRONOLACTONE 25 MG PO TABS   Oral   Take 25 mg by mouth 2 (two) times daily.         Marland Kitchen TEMAZEPAM 7.5 MG PO CAPS   Oral   Take 7.5 mg by mouth at bedtime.         Marland Kitchen TIOTROPIUM BROMIDE MONOHYDRATE 18 MCG IN CAPS   Inhalation   Place 18 mcg into inhaler and inhale daily.         . TRAZODONE HCL 100 MG PO TABS   Oral   Take 100 mg by mouth at bedtime as needed. Sleep         . VITAMIN D (ERGOCALCIFEROL) 50000 UNITS PO CAPS   Oral   Take 50,000 Units by mouth every 7  (seven) days. Saturday           BP 127/62  Pulse 98  Temp 99.8 F (37.7 C) (Oral)  Resp 20  Ht 5\' 7"  (1.702 m)  Wt 203 lb (92.08 kg)  BMI 31.79 kg/m2  SpO2 93%  Physical Exam  Nursing note and vitals reviewed. Constitutional: He appears well-developed and well-nourished. No distress.  HENT:  Head: Normocephalic and atraumatic.  Eyes: Conjunctivae normal are normal. Right eye exhibits no discharge. Left eye exhibits no discharge.  Neck: Neck supple.  Cardiovascular: Normal rate, regular rhythm and normal heart sounds.  Exam reveals no gallop and no friction rub.   No murmur heard. Pulmonary/Chest: Effort normal. No respiratory distress. He has wheezes (faint bilaterally). He has no rales.  Abdominal: Soft. He exhibits no distension. There is no tenderness.  Musculoskeletal: He exhibits edema (pitting lower extremity). He exhibits no tenderness.  Neurological: He is alert.  Skin: Skin is warm and dry.  Psychiatric: He has a normal mood and affect. His behavior is normal. Thought content normal.    ED Course  Procedures (including critical care time) DIAGNOSTIC STUDIES: Oxygen Saturation is 93% on room air, low by my interpretation.    COORDINATION OF CARE: 3:12 PM Discussed ED treatment with pt  3:13 PM Ordered:  Medications  predniSONE (DELTASONE) tablet 60 mg (not administered)  albuterol (PROVENTIL) (5 MG/ML) 0.5% nebulizer solution 5 mg (not administered)  ipratropium (ATROVENT) nebulizer solution 0.5 mg (not administered)  oxyCODONE-acetaminophen (PERCOCET/ROXICET) 5-325 MG per tablet 2 tablet (not administered)       Labs Reviewed  GLUCOSE, CAPILLARY   Dg Chest 2  View  07/18/2012  *RADIOLOGY REPORT*  Clinical Data: Cough and nausea.  CHEST - 2 VIEW  Comparison: 06/30/2012.  Findings: Trachea is midline.  Heart size stable.  Scarring at the lung bases.  Probable prominent extrapleural fat along the lateral hemithoraces bilaterally.  Lungs are otherwise  clear.  No pleural fluid.  IMPRESSION: No acute findings.   Original Report Authenticated By: Leanna Battles, M.D.      1. COPD exacerbation       MDM  71yM with sob. Likely copd exacerbation. tx'd in ED as such with improvement of symptoms. Afebrile and well appearing. No significant increased WOB. Feel stable for discharge. Emergent return precautions discussed.      I personally preformed the services scribed in my presence. The recorded information has been reviewed is accurate. Herbert Razor, MD.     Herbert Razor, MD 07/22/12 [phone]

## 2012-07-18 NOTE — ED Notes (Signed)
Discharge instructions reviewed with pt, questions answered. Pt verbalized understanding.  

## 2012-07-18 NOTE — ED Notes (Signed)
Cough 3-4 days,hemoptysis today,Headache, , shakey

## 2012-07-25 DIAGNOSIS — M19019 Primary osteoarthritis, unspecified shoulder: Secondary | ICD-10-CM | POA: Diagnosis not present

## 2012-07-25 DIAGNOSIS — M545 Low back pain: Secondary | ICD-10-CM | POA: Diagnosis not present

## 2012-07-25 DIAGNOSIS — M961 Postlaminectomy syndrome, not elsewhere classified: Secondary | ICD-10-CM | POA: Diagnosis not present

## 2012-07-25 DIAGNOSIS — G894 Chronic pain syndrome: Secondary | ICD-10-CM | POA: Diagnosis not present

## 2012-07-25 DIAGNOSIS — M79609 Pain in unspecified limb: Secondary | ICD-10-CM | POA: Diagnosis not present

## 2012-07-25 DIAGNOSIS — M25519 Pain in unspecified shoulder: Secondary | ICD-10-CM | POA: Diagnosis not present

## 2012-07-25 DIAGNOSIS — M25569 Pain in unspecified knee: Secondary | ICD-10-CM | POA: Diagnosis not present

## 2012-08-02 DIAGNOSIS — E119 Type 2 diabetes mellitus without complications: Secondary | ICD-10-CM | POA: Diagnosis not present

## 2012-08-02 DIAGNOSIS — J449 Chronic obstructive pulmonary disease, unspecified: Secondary | ICD-10-CM | POA: Diagnosis not present

## 2012-08-13 ENCOUNTER — Emergency Department (HOSPITAL_COMMUNITY)
Admission: EM | Admit: 2012-08-13 | Discharge: 2012-08-13 | Disposition: A | Discharge status: Home / Self Care | Payer: Medicare Other | Attending: Emergency Medicine | Admitting: Emergency Medicine

## 2012-08-13 ENCOUNTER — Encounter (HOSPITAL_COMMUNITY): Payer: Self-pay | Admitting: Emergency Medicine

## 2012-08-13 DIAGNOSIS — Z9889 Other specified postprocedural states: Secondary | ICD-10-CM | POA: Diagnosis not present

## 2012-08-13 DIAGNOSIS — M545 Low back pain, unspecified: Secondary | ICD-10-CM | POA: Diagnosis not present

## 2012-08-13 DIAGNOSIS — J4489 Other specified chronic obstructive pulmonary disease: Secondary | ICD-10-CM | POA: Insufficient documentation

## 2012-08-13 DIAGNOSIS — R52 Pain, unspecified: Secondary | ICD-10-CM | POA: Diagnosis not present

## 2012-08-13 DIAGNOSIS — Z8673 Personal history of transient ischemic attack (TIA), and cerebral infarction without residual deficits: Secondary | ICD-10-CM | POA: Insufficient documentation

## 2012-08-13 DIAGNOSIS — IMO0002 Reserved for concepts with insufficient information to code with codable children: Secondary | ICD-10-CM | POA: Insufficient documentation

## 2012-08-13 DIAGNOSIS — G8929 Other chronic pain: Secondary | ICD-10-CM | POA: Diagnosis not present

## 2012-08-13 DIAGNOSIS — R350 Frequency of micturition: Secondary | ICD-10-CM | POA: Insufficient documentation

## 2012-08-13 DIAGNOSIS — Z933 Colostomy status: Secondary | ICD-10-CM | POA: Insufficient documentation

## 2012-08-13 DIAGNOSIS — M79609 Pain in unspecified limb: Secondary | ICD-10-CM | POA: Insufficient documentation

## 2012-08-13 DIAGNOSIS — Z79899 Other long term (current) drug therapy: Secondary | ICD-10-CM | POA: Insufficient documentation

## 2012-08-13 DIAGNOSIS — Z794 Long term (current) use of insulin: Secondary | ICD-10-CM | POA: Insufficient documentation

## 2012-08-13 DIAGNOSIS — J449 Chronic obstructive pulmonary disease, unspecified: Secondary | ICD-10-CM | POA: Insufficient documentation

## 2012-08-13 DIAGNOSIS — Z87891 Personal history of nicotine dependence: Secondary | ICD-10-CM | POA: Insufficient documentation

## 2012-08-13 MED ORDER — PREDNISONE 50 MG PO TABS: 50.0000 mg | ORAL_TABLET | Freq: Every day | ORAL | Status: DC

## 2012-08-13 MED ORDER — PREDNISONE 50 MG PO TABS: 60.0000 mg | ORAL_TABLET | Freq: Once | ORAL | Status: DC

## 2012-08-13 MED ORDER — HYDROMORPHONE HCL 2 MG PO TABS
2.0000 mg | ORAL_TABLET | Freq: Once | ORAL | Status: AC
  Administered 2012-08-13: 2 mg via ORAL

## 2012-08-13 MED ORDER — PREDNISONE 20 MG PO TABS
ORAL_TABLET | ORAL | Status: AC
  Administered 2012-08-13: 60 mg
  Filled 2012-08-13: qty 3

## 2012-08-13 MED ORDER — HYDROMORPHONE HCL 2 MG PO TABS: 2.0000 mg | ORAL_TABLET | ORAL | Status: DC | PRN

## 2012-08-13 MED ORDER — DIAZEPAM 5 MG PO TABS: 5.0000 mg | ORAL_TABLET | Freq: Three times a day (TID) | ORAL | Status: DC | PRN

## 2012-08-13 MED ORDER — LORAZEPAM 1 MG PO TABS
1.0000 mg | ORAL_TABLET | Freq: Once | ORAL | Status: AC
  Administered 2012-08-13: 1 mg via ORAL
  Filled 2012-08-13: qty 1

## 2012-08-13 NOTE — ED Notes (Signed)
Watching TV.  

## 2012-08-13 NOTE — ED Notes (Signed)
Ready for discharge per patient 

## 2012-08-13 NOTE — ED Provider Notes (Signed)
History    This chart was scribed for Dione Booze, MD by Melba Coon, ED Scribe. The patient was seen in room APA03/APA03 and the patient's care was started at 6:47PM.    CSN: [id number]  Arrival date & time 08/13/12  1616   First MD Initiated Contact with Patient 08/13/12 1835      Chief Complaint  Patient presents with  . Leg Pain  . Back Pain    (Consider location/radiation/quality/duration/timing/severity/associated sxs/prior treatment) The history is provided by the patient. No language interpreter was used.   Herbert May is a 72 y.o. male who presents to the Emergency Department complaining of constant, moderate to severe lower back pain that radiates to his bilateral lower extremities with a sudden onset 4 days ago. He reports a history of chronic back pain and has had back surgery in the past (reports 8 screws placed in his back). He takes percocet and patch for his chronic pain. Around onset, he reports he tried to open his front door which was blocked with snow; he aggravated his back pain at that time and has gotten progressively worse. He now reports bilateral leg pain which is not present at baseline. He reports his left leg hurts more than his right leg and is hurts worse behind the knee caps. Standing and sitting down for extended periods of time aggravates the pain. Percocet alleviates the pain. He reports the severity of the pain is 10/10. He reports increased urinary frequency; he takes Lasix. He reports sweats but denies fever. Denies HA,  neck pain, sore throat, rash, CP, SOB, abdominal pain, nausea, emesis, diarrhea, dysuria, or extremity edema, weakness, numbness, or tingling. No other pertinent medical symptoms.   Past Medical History  Diagnosis Date  . Heart attack   . COPD (chronic obstructive pulmonary disease)   . Colostomy status     Past Surgical History  Procedure Laterality Date  . Colostomy    . Nose surgery    . Back surgery    . Hernia  repair    . Knee surgery    . Cardiac stents      History reviewed. No pertinent family history.  History  Substance Use Topics  . Smoking status: Former Games developer  . Smokeless tobacco: Not on file  . Alcohol Use: No      Review of Systems  Musculoskeletal: Positive for back pain.  All other systems reviewed and are negative.   Allergies  Aspirin; Fish allergy; Ibuprofen; Latex; Onion; Other; Levaquin; and Neosporin  Home Medications   Current Outpatient Rx  Name  Route  Sig  Dispense  Refill  . albuterol (PROAIR HFA) 108 (90 BASE) MCG/ACT inhaler   Inhalation   Inhale 2 puffs into the lungs 4 (four) times daily.         Marland Kitchen albuterol (PROVENTIL) (2.5 MG/3ML) 0.083% nebulizer solution   Nebulization   Take 2.5 mg by nebulization every 4 (four) hours as needed.          Marland Kitchen azithromycin (ZITHROMAX Z-PAK) 250 MG tablet   Oral   Take 1 tablet (250 mg total) by mouth daily. 2 pills (500mg ) day 1 then 1 pill (250mg ) days 2-5.   6 tablet   0   . budesonide-formoterol (SYMBICORT) 160-4.5 MCG/ACT inhaler   Inhalation   Inhale 2 puffs into the lungs daily.         . diazepam (VALIUM) 5 MG tablet   Oral   Take 5 mg by mouth at bedtime.  Sleep         . Diclofenac Potassium (ZIPSOR) 25 MG CAPS   Oral   Take 25 mg by mouth daily.         . fentaNYL (DURAGESIC - DOSED MCG/HR) 75 MCG/HR   Transdermal   Place 1 patch onto the skin every 3 (three) days.         . ferrous sulfate 325 (65 FE) MG tablet   Oral   Take 325 mg by mouth daily with breakfast.         . fluticasone (FLOVENT HFA) 110 MCG/ACT inhaler   Inhalation   Inhale 1 puff into the lungs 2 (two) times daily.         . furosemide (LASIX) 40 MG tablet   Oral   Take 40 mg by mouth daily.         Marland Kitchen gabapentin (NEURONTIN) 300 MG capsule   Oral   Take 300 mg by mouth 3 (three) times daily.         . insulin detemir (LEVEMIR) 100 UNIT/ML injection   Subcutaneous   Inject 60 Units into the  skin 2 (two) times daily.         Marland Kitchen ipratropium (ATROVENT) 0.02 % nebulizer solution   Nebulization   Take 500 mcg by nebulization 4 (four) times daily.         . metFORMIN (GLUCOPHAGE) 500 MG tablet   Oral   Take 500 mg by mouth 2 (two) times daily with a meal.         . oxyCODONE-acetaminophen (PERCOCET) 10-325 MG per tablet   Oral   Take 1 tablet by mouth 4 (four) times daily.         . pantoprazole (PROTONIX) 40 MG tablet   Oral   Take 40 mg by mouth daily.         . potassium chloride SA (K-DUR,KLOR-CON) 20 MEQ tablet   Oral   Take 20 mEq by mouth daily.         . predniSONE (DELTASONE) 20 MG tablet   Oral   Take 2 tablets (40 mg total) by mouth daily.   10 tablet   0   . simvastatin (ZOCOR) 40 MG tablet   Oral   Take 40 mg by mouth every evening.         Marland Kitchen spironolactone (ALDACTONE) 25 MG tablet   Oral   Take 25 mg by mouth 2 (two) times daily.         . temazepam (RESTORIL) 7.5 MG capsule   Oral   Take 7.5 mg by mouth at bedtime.         Marland Kitchen tiotropium (SPIRIVA) 18 MCG inhalation capsule   Inhalation   Place 18 mcg into inhaler and inhale daily.         . traZODone (DESYREL) 100 MG tablet   Oral   Take 100 mg by mouth at bedtime as needed. Sleep         . Vitamin D, Ergocalciferol, (DRISDOL) 50000 UNITS CAPS   Oral   Take 50,000 Units by mouth every 7 (seven) days. Saturday           BP 123/65  Pulse 106  Temp(Src) 98 F (36.7 C) (Oral)  Resp 16  Ht 5\' 7"  (1.702 m)  Wt 210 lb (95.255 kg)  BMI 32.88 kg/m2  SpO2 94%  Physical Exam  Nursing note and vitals reviewed. Constitutional: He is oriented to person, place, and  time. He appears well-developed and well-nourished.  Non-toxic appearance. He does not appear ill. No distress.  HENT:  Head: Normocephalic and atraumatic.  Right Ear: External ear normal.  Left Ear: External ear normal.  Nose: Nose normal. No mucosal edema or rhinorrhea.  Mouth/Throat: Oropharynx is clear  and moist and mucous membranes are normal. No dental abscesses or edematous.  Eyes: Conjunctivae and EOM are normal. Pupils are equal, round, and reactive to light.  Neck: Normal range of motion and full passive range of motion without pain. Neck supple.  Cardiovascular: Normal rate, regular rhythm and normal heart sounds.  Exam reveals no gallop and no friction rub.   No murmur heard. Pulmonary/Chest: Effort normal and breath sounds normal. No respiratory distress. He has no wheezes. He has no rhonchi. He has no rales. He exhibits no tenderness and no crepitus.  Abdominal: Soft. Normal appearance and bowel sounds are normal. He exhibits no distension. There is no tenderness. There is no rebound and no guarding.  Musculoskeletal: Normal range of motion. He exhibits edema and tenderness.  Moderate tenderness to lumbar spine with para lumbar spasms bilaterally. Positive SLR with 15 degrees in elevation. Diffuse bilateral leg tenderness with 1+ pitting edema, strong distal pulses, and normal sensation.  Neurological: He is alert and oriented to person, place, and time. He has normal strength. No cranial nerve deficit.  Skin: Skin is warm, dry and intact. No rash noted. No erythema. No pallor.  Psychiatric: He has a normal mood and affect. His speech is normal and behavior is normal. His mood appears not anxious.    ED Course  Procedures (including critical care time)  DIAGNOSTIC STUDIES: Oxygen Saturation is 94% on room air, adequate by my interpretation.    COORDINATION OF CARE:  7:00PM - prednisone, dilaudid, and ativan will be ordered for Lowella Dell.    1. Acute exacerbation of chronic low back pain       MDM  Exacerbation of chronic back pain. He will be given a trial of oral hydromorphone, prednisone, lorazepam.  He feels much better following the above-noted treatment. He is discharged with prescriptions for hydromorphone, prednisone, diazepam and is to followup with his  PCP.  I personally performed the services described in this documentation, which was scribed in my presence. The recorded information has been reviewed and is accurate.          Dione Booze, MD 08/13/12 2041

## 2012-08-13 NOTE — ED Notes (Signed)
Pt c/o lower back and bilateral leg pain x 4 days.

## 2012-08-17 ENCOUNTER — Emergency Department (HOSPITAL_COMMUNITY): Payer: Medicare Other

## 2012-08-17 ENCOUNTER — Emergency Department (HOSPITAL_COMMUNITY)
Admission: EM | Admit: 2012-08-17 | Discharge: 2012-08-17 | Disposition: A | Discharge status: Home / Self Care | Payer: Medicare Other | Attending: Emergency Medicine | Admitting: Emergency Medicine

## 2012-08-17 ENCOUNTER — Encounter (HOSPITAL_COMMUNITY): Payer: Self-pay | Admitting: *Deleted

## 2012-08-17 DIAGNOSIS — J4489 Other specified chronic obstructive pulmonary disease: Secondary | ICD-10-CM | POA: Insufficient documentation

## 2012-08-17 DIAGNOSIS — R61 Generalized hyperhidrosis: Secondary | ICD-10-CM | POA: Diagnosis not present

## 2012-08-17 DIAGNOSIS — Z8674 Personal history of sudden cardiac arrest: Secondary | ICD-10-CM | POA: Insufficient documentation

## 2012-08-17 DIAGNOSIS — Z933 Colostomy status: Secondary | ICD-10-CM | POA: Diagnosis not present

## 2012-08-17 DIAGNOSIS — IMO0002 Reserved for concepts with insufficient information to code with codable children: Secondary | ICD-10-CM | POA: Diagnosis not present

## 2012-08-17 DIAGNOSIS — Z7982 Long term (current) use of aspirin: Secondary | ICD-10-CM | POA: Diagnosis not present

## 2012-08-17 DIAGNOSIS — Z794 Long term (current) use of insulin: Secondary | ICD-10-CM | POA: Insufficient documentation

## 2012-08-17 DIAGNOSIS — R110 Nausea: Secondary | ICD-10-CM | POA: Insufficient documentation

## 2012-08-17 DIAGNOSIS — R0602 Shortness of breath: Secondary | ICD-10-CM | POA: Diagnosis not present

## 2012-08-17 DIAGNOSIS — R0789 Other chest pain: Secondary | ICD-10-CM | POA: Diagnosis not present

## 2012-08-17 DIAGNOSIS — Z79899 Other long term (current) drug therapy: Secondary | ICD-10-CM | POA: Diagnosis not present

## 2012-08-17 DIAGNOSIS — Z87891 Personal history of nicotine dependence: Secondary | ICD-10-CM | POA: Insufficient documentation

## 2012-08-17 DIAGNOSIS — R079 Chest pain, unspecified: Secondary | ICD-10-CM | POA: Diagnosis not present

## 2012-08-17 DIAGNOSIS — J449 Chronic obstructive pulmonary disease, unspecified: Secondary | ICD-10-CM | POA: Diagnosis not present

## 2012-08-17 LAB — BASIC METABOLIC PANEL
CO2: 24 mEq/L (ref 19–32)
GFR calc non Af Amer: 81 mL/min — ABNORMAL LOW (ref >90)
Glucose, Bld: 115 mg/dL — ABNORMAL HIGH (ref 70–99)
Potassium: 4 mEq/L (ref 3.5–5.1)
Sodium: 138 mEq/L (ref 135–145)

## 2012-08-17 LAB — CBC WITH DIFFERENTIAL/PLATELET
Basophils Absolute: 0 10*3/uL (ref 0.0–0.1)
Lymphocytes Relative: 32 % (ref 12–46)
Lymphs Abs: 2.4 10*3/uL (ref 0.7–4.0)
Neutrophils Relative %: 59 % (ref 43–77)
Platelets: 364 10*3/uL (ref 150–400)
RBC: 5.28 MIL/uL (ref 4.22–5.81)
WBC: 7.7 10*3/uL (ref 4.0–10.5)

## 2012-08-17 LAB — TROPONIN I
Troponin I: <0.3 ng/mL (ref <0.30)
Troponin I: <0.3 ng/mL (ref <0.30)
Troponin I: <0.3 ng/mL (ref <0.30)

## 2012-08-17 MED ORDER — ONDANSETRON HCL 4 MG/2ML IJ SOLN
4.0000 mg | Freq: Once | INTRAMUSCULAR | Status: AC
  Administered 2012-08-17: 4 mg via INTRAVENOUS
  Filled 2012-08-17: qty 2

## 2012-08-17 MED ORDER — ASPIRIN 81 MG PO CHEW
324.0000 mg | CHEWABLE_TABLET | Freq: Once | ORAL | Status: AC
  Administered 2012-08-17: 324 mg via ORAL
  Filled 2012-08-17: qty 1

## 2012-08-17 MED ORDER — MORPHINE SULFATE 4 MG/ML IJ SOLN
4.0000 mg | Freq: Once | INTRAMUSCULAR | Status: AC
  Administered 2012-08-17: 4 mg via INTRAVENOUS
  Filled 2012-08-17: qty 1

## 2012-08-17 MED ORDER — NITROGLYCERIN 2 % TD OINT
1.0000 [in_us] | TOPICAL_OINTMENT | Freq: Once | TRANSDERMAL | Status: AC
  Administered 2012-08-17: 1 [in_us] via TOPICAL
  Filled 2012-08-17: qty 1

## 2012-08-17 NOTE — ED Notes (Signed)
Pain to left chest and left shoulder. Pain began an hour ago. Described as constant, sharp pain with worsening pain at times.

## 2012-08-17 NOTE — ED Notes (Signed)
EDP in to eval 

## 2012-08-17 NOTE — ED Provider Notes (Signed)
History     This chart was scribed for Ward Givens, MD, MD by Burman Nieves ED Scribe. The patient was seen in room APA04/APA04 and the patient's care was started at 4:22 PM.  CSN: [id number]  Arrival date & time 08/17/12  1537   Chief Complaint  Patient presents with  . Chest Pain    (Consider location/radiation/quality/duration/timing/severity/associated sxs/prior treatment) Patient is a 72 y.o. male presenting with chest pain. The history is provided by the patient. No language interpreter was used.  Chest Pain Pain location:  L chest Pain quality: sharp   Pain radiates to:  L shoulder Pain radiates to the back: yes   Duration:  1 hour Timing:  Constant Progression:  Improving Chronicity:  New Context: eating   Relieved by:  Nothing Worsened by:  Nothing tried Associated symptoms: diaphoresis, nausea and shortness of breath   Associated symptoms: no fever, not vomiting and no weakness    Herbert May is a 72 y.o. male who presents to the Emergency Department complaining of constant sharp chest pains onset 1 hour ago while  he was eating supper (cheesesteak, mashed potatoes, peas...) with lpain starting in his left posterior shoulder radiating into his left anterior chest with pain level of 10/10. Pt broke out in sweat and face flushed.  Pt denies fever, chills, cough, vomiting, diarrhea, SOB, weakness, and any other associated symptoms. He denies smoking and uses no oxygen. His current pain is a 7/10 without specific treatment.   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 Patient states he also has 3 cardiac stents, he thinks the last was any where from 3-10 years ago. He states his last heart catheterization was anywhere from 3-10 years ago and he was told his stents were okay. Patient is a very poor historian.  Patient states he can take baby aspirin, he states if he takes aspirin on a prolonged  course he breaks out in a rash.  [facility] in [place]   Past Medical History  Diagnosis Date  . Heart attack   . COPD (chronic obstructive pulmonary disease)   . Colostomy status     Past Surgical History  Procedure Laterality Date  . Colostomy    . Nose surgery    . Back surgery    . Hernia repair    . Knee surgery    . Cardiac stents      No family history on file.  History  Substance Use Topics  . Smoking status: Former Games developer  . Smokeless tobacco: Not on file  . Alcohol Use: No   Lives at home Lives with spouse No oxygen at home    Review of Systems  Unable to perform ROS Constitutional: Positive for diaphoresis. Negative for fever and chills.  HENT: Negative.   Eyes: Negative.   Respiratory: Positive for shortness of breath.   Cardiovascular: Positive for chest pain.  Gastrointestinal: Positive for nausea. Negative for vomiting.  Musculoskeletal: Negative.   Neurological: Negative for weakness.  All other systems reviewed and are negative.    Allergies  Aspirin; Fish allergy; Ibuprofen; Latex; Onion; Other; Levaquin; and Neosporin  Home Medications   Current Outpatient Rx  Name  Route  Sig  Dispense  Refill  . albuterol (PROAIR HFA) 108 (90 BASE) MCG/ACT inhaler   Inhalation   Inhale 2 puffs into the lungs 4 (four) times daily.         Marland Kitchen  albuterol (PROVENTIL) (2.5 MG/3ML) 0.083% nebulizer solution   Nebulization   Take 2.5 mg by nebulization every 4 (four) hours as needed for wheezing or shortness of breath.          Marland Kitchen aspirin EC 81 MG tablet   Oral   Take 81 mg by mouth daily.         . budesonide-formoterol (SYMBICORT) 160-4.5 MCG/ACT inhaler   Inhalation   Inhale 2 puffs into the lungs daily.         . cyclobenzaprine (FLEXERIL) 10 MG tablet   Oral   Take 10 mg by mouth at bedtime.         . diazepam (VALIUM) 5 MG tablet   Oral   Take 1 tablet (5 mg total) by mouth 3 (three) times daily as needed for anxiety  (muscle spasms).   30 tablet   0   . Diclofenac Potassium (ZIPSOR) 25 MG CAPS   Oral   Take 25 mg by mouth daily.         Marland Kitchen diltiazem (CARDIZEM) 30 MG tablet   Oral   Take 30 mg by mouth 2 (two) times daily.         . fentaNYL (DURAGESIC - DOSED MCG/HR) 100 MCG/HR   Transdermal   Place 1 patch onto the skin every 3 (three) days.         . ferrous sulfate 325 (65 FE) MG tablet   Oral   Take 325 mg by mouth daily with breakfast.         . fexofenadine (ALLEGRA) 180 MG tablet   Oral   Take 180 mg by mouth daily.         . fluticasone (FLOVENT HFA) 110 MCG/ACT inhaler   Inhalation   Inhale 1 puff into the lungs 2 (two) times daily.         . furosemide (LASIX) 40 MG tablet   Oral   Take 40 mg by mouth daily.         Marland Kitchen gabapentin (NEURONTIN) 300 MG capsule   Oral   Take 300 mg by mouth 3 (three) times daily.         Marland Kitchen HYDROmorphone (DILAUDID) 2 MG tablet   Oral   Take 1 tablet (2 mg total) by mouth every 4 (four) hours as needed for pain.   15 tablet   0   . insulin detemir (LEVEMIR) 100 UNIT/ML injection   Subcutaneous   Inject 60 Units into the skin 2 (two) times daily.         Marland Kitchen ipratropium (ATROVENT) 0.02 % nebulizer solution   Nebulization   Take 500 mcg by nebulization 4 (four) times daily.         . metFORMIN (GLUCOPHAGE) 500 MG tablet   Oral   Take 500 mg by mouth 2 (two) times daily.          Marland Kitchen oxyCODONE-acetaminophen (PERCOCET) 10-325 MG per tablet   Oral   Take 1 tablet by mouth 4 (four) times daily.         . pantoprazole (PROTONIX) 40 MG tablet   Oral   Take 40 mg by mouth daily.         . potassium chloride SA (K-DUR,KLOR-CON) 20 MEQ tablet   Oral   Take 20 mEq by mouth daily.         . predniSONE (DELTASONE) 50 MG tablet   Oral   Take 1 tablet (50 mg total) by mouth  daily.   5 tablet   0   . simvastatin (ZOCOR) 40 MG tablet   Oral   Take 40 mg by mouth every evening.         . temazepam (RESTORIL) 7.5  MG capsule   Oral   Take 7.5 mg by mouth at bedtime.         Marland Kitchen tiotropium (SPIRIVA) 18 MCG inhalation capsule   Inhalation   Place 18 mcg into inhaler and inhale daily.         . traZODone (DESYREL) 100 MG tablet   Oral   Take 100 mg by mouth at bedtime. Sleep         . Vitamin D, Ergocalciferol, (DRISDOL) 50000 UNITS CAPS   Oral   Take 50,000 Units by mouth every 7 (seven) days. Saturday           BP 120/61  Pulse 77  Temp(Src) 98.1 F (36.7 C) (Oral)  Resp 16  Ht 5\' 7"  (1.702 m)  Wt 210 lb (95.255 kg)  BMI 32.88 kg/m2  SpO2 96%  Vital signs normal    Physical Exam  Nursing note and vitals reviewed. Constitutional: He is oriented to person, place, and time. He appears well-developed and well-nourished.  HENT:  Head: Normocephalic and atraumatic.  Right Ear: External ear normal.  Left Ear: External ear normal.  Mouth/Throat: Oropharynx is clear and moist.  Eyes: Conjunctivae and EOM are normal. Pupils are equal, round, and reactive to light.  Neck: Normal range of motion. Neck supple.  Cardiovascular: Normal rate, regular rhythm and normal heart sounds.   Pulmonary/Chest: Effort normal. No respiratory distress. He has decreased breath sounds. He has no wheezes. He has no rales. He exhibits tenderness.    Tender in upper left chest  Diminished breath sounds   Abdominal: Soft. Bowel sounds are normal.  Musculoskeletal: Normal range of motion.  Moderate upper chest pain lq Scattered bruising on body  Neurological: He is alert and oriented to person, place, and time.  Skin: Skin is warm and dry.  Psychiatric: His behavior is normal.  Flat affect    ED Course  Procedures (including critical care time)  Medications  morphine 4 MG/ML injection 4 mg (4 mg Intravenous Given 08/17/12 1653)  ondansetron (ZOFRAN) injection 4 mg (4 mg Intravenous Given 08/17/12 1652)  aspirin chewable tablet 324 mg (324 mg Oral Given 08/17/12 1653)  nitroGLYCERIN (NITROGLYN) 2  % ointment 1 inch (1 inch Topical Given 08/17/12 1653)   DIAGNOSTIC STUDIES: Oxygen Saturation is 96% on room air, normal by my interpretation.     COORDINATION OF CARE: 5:08 PM Discussed ED treatment with pt and pt agrees.   Patient states he can take baby aspirin however if he takes regular strength aspirin on a regular basis he gets itching.  6:14 PM discussed results with pt. Pt rates pain 3/10. Additional troponin test waiting for results patient does not want to be admitted to the hospital for "slow rule out" and see a cardiologist in the morning. I will add a third troponin and if it is negative I will feel a little better about him being discharged from the emergency department. Patient does not have a local cardiologist so I will give him Dr. Erin Hearing office number  Results for orders placed during the hospital encounter of 08/17/12  CBC WITH DIFFERENTIAL      Result Value Range   WBC 7.7  4.0 - 10.5 K/uL   RBC 5.28  4.22 - 5.81  MIL/uL   Hemoglobin 12.7 (*) 13.0 - 17.0 g/dL   HCT 16.1  09.6 - 04.5 %   MCV 76.7 (*) 78.0 - 100.0 fL   MCH 24.1 (*) 26.0 - 34.0 pg   MCHC 31.4  30.0 - 36.0 g/dL   RDW 40.9 (*) 81.1 - 91.4 %   Platelets 364  150 - 400 K/uL   Neutrophils Relative 59  43 - 77 %   Neutro Abs 4.5  1.7 - 7.7 K/uL   Lymphocytes Relative 32  12 - 46 %   Lymphs Abs 2.4  0.7 - 4.0 K/uL   Monocytes Relative 8  3 - 12 %   Monocytes Absolute 0.6  0.1 - 1.0 K/uL   Eosinophils Relative 1  0 - 5 %   Eosinophils Absolute 0.1  0.0 - 0.7 K/uL   Basophils Relative 0  0 - 1 %   Basophils Absolute 0.0  0.0 - 0.1 K/uL  BASIC METABOLIC PANEL      Result Value Range   Sodium 138  135 - 145 mEq/L   Potassium 4.0  3.5 - 5.1 mEq/L   Chloride 105  96 - 112 mEq/L   CO2 24  19 - 32 mEq/L   Glucose, Bld 115 (*) 70 - 99 mg/dL   BUN 11  6 - 23 mg/dL   Creatinine, Ser 7.82  0.50 - 1.35 mg/dL   Calcium 9.4  8.4 - 95.6 mg/dL   GFR calc non Af Amer 81 (*) >90 mL/min   GFR calc Af Amer >90   >90 mL/min  TROPONIN I      Result Value Range   Troponin I <0.30  <0.30 ng/mL  TROPONIN I      Result Value Range   Troponin I <0.30  <0.30 ng/mL  TROPONIN I      Result Value Range   Troponin I <0.30  <0.30 ng/mL   Laboratory interpretation all normal except mild anemia   Dg Chest Port 1 View  08/17/2012  *RADIOLOGY REPORT*  Clinical Data: Chest pain.  PORTABLE CHEST - 1 VIEW  Comparison: 07/18/2012.  Findings: The heart is enlarged but stable.  The mediastinal and hilar contours are stable.  There is streaky basilar scarring changes and right basilar atelectasis.  No edema or effusions.  IMPRESSION: Cardiac enlargement with basilar scarring and right basilar atelectasis.   Original Report Authenticated By: Rudie Meyer, M.D.        Date: 08/17/2012  Rate: 78  Rhythm: normal sinus rhythm  QRS Axis: normal  Intervals: normal  ST/T Wave abnormalities: normal  Conduction Disutrbances:left anterior fascicular block  Narrative Interpretation:   Old EKG Reviewed: unchanged from 07/18/2012     1. Chest pain    Plan discharge  Devoria Albe, MD, FACEP    MDM    I personally performed the services described in this documentation, which was scribed in my presence. The recorded information has been reviewed and considered.  Devoria Albe, MD, Armando Gang    Ward Givens, MD 08/17/12 [phone]

## 2012-09-07 ENCOUNTER — Emergency Department (HOSPITAL_COMMUNITY): Payer: Medicare Other

## 2012-09-07 ENCOUNTER — Emergency Department (HOSPITAL_COMMUNITY)
Admission: EM | Admit: 2012-09-07 | Discharge: 2012-09-07 | Disposition: A | Discharge status: Home / Self Care | Payer: Medicare Other | Attending: Emergency Medicine | Admitting: Emergency Medicine

## 2012-09-07 ENCOUNTER — Encounter (HOSPITAL_COMMUNITY): Payer: Self-pay

## 2012-09-07 DIAGNOSIS — Z9889 Other specified postprocedural states: Secondary | ICD-10-CM | POA: Insufficient documentation

## 2012-09-07 DIAGNOSIS — M25569 Pain in unspecified knee: Secondary | ICD-10-CM | POA: Insufficient documentation

## 2012-09-07 DIAGNOSIS — M545 Low back pain, unspecified: Secondary | ICD-10-CM | POA: Insufficient documentation

## 2012-09-07 DIAGNOSIS — IMO0002 Reserved for concepts with insufficient information to code with codable children: Secondary | ICD-10-CM | POA: Insufficient documentation

## 2012-09-07 DIAGNOSIS — Z87891 Personal history of nicotine dependence: Secondary | ICD-10-CM | POA: Diagnosis not present

## 2012-09-07 DIAGNOSIS — Z8674 Personal history of sudden cardiac arrest: Secondary | ICD-10-CM | POA: Diagnosis not present

## 2012-09-07 DIAGNOSIS — J4489 Other specified chronic obstructive pulmonary disease: Secondary | ICD-10-CM | POA: Insufficient documentation

## 2012-09-07 DIAGNOSIS — Z933 Colostomy status: Secondary | ICD-10-CM | POA: Insufficient documentation

## 2012-09-07 DIAGNOSIS — R609 Edema, unspecified: Secondary | ICD-10-CM | POA: Insufficient documentation

## 2012-09-07 DIAGNOSIS — Z7982 Long term (current) use of aspirin: Secondary | ICD-10-CM | POA: Insufficient documentation

## 2012-09-07 DIAGNOSIS — Z794 Long term (current) use of insulin: Secondary | ICD-10-CM | POA: Insufficient documentation

## 2012-09-07 DIAGNOSIS — E785 Hyperlipidemia, unspecified: Secondary | ICD-10-CM | POA: Diagnosis not present

## 2012-09-07 DIAGNOSIS — M5137 Other intervertebral disc degeneration, lumbosacral region: Secondary | ICD-10-CM | POA: Diagnosis not present

## 2012-09-07 DIAGNOSIS — R7989 Other specified abnormal findings of blood chemistry: Secondary | ICD-10-CM | POA: Diagnosis not present

## 2012-09-07 DIAGNOSIS — J449 Chronic obstructive pulmonary disease, unspecified: Secondary | ICD-10-CM | POA: Diagnosis not present

## 2012-09-07 DIAGNOSIS — Z79899 Other long term (current) drug therapy: Secondary | ICD-10-CM | POA: Diagnosis not present

## 2012-09-07 LAB — CBC WITH DIFFERENTIAL/PLATELET
Basophils Absolute: 0 10*3/uL (ref 0.0–0.1)
Eosinophils Relative: 1 % (ref 0–5)
Lymphocytes Relative: 24 % (ref 12–46)
Lymphs Abs: 2.2 10*3/uL (ref 0.7–4.0)
Neutro Abs: 6 10*3/uL (ref 1.7–7.7)
Neutrophils Relative %: 64 % (ref 43–77)
Platelets: 282 10*3/uL (ref 150–400)
RBC: 5.53 MIL/uL (ref 4.22–5.81)
RDW: 16.7 % — ABNORMAL HIGH (ref 11.5–15.5)
WBC: 9.3 10*3/uL (ref 4.0–10.5)

## 2012-09-07 LAB — BASIC METABOLIC PANEL
CO2: 24 mEq/L (ref 19–32)
Calcium: 9.1 mg/dL (ref 8.4–10.5)
Chloride: 96 mEq/L (ref 96–112)
Potassium: 3.9 mEq/L (ref 3.5–5.1)
Sodium: 133 mEq/L — ABNORMAL LOW (ref 135–145)

## 2012-09-07 LAB — PRO B NATRIURETIC PEPTIDE: Pro B Natriuretic peptide (BNP): 71 pg/mL (ref 0–125)

## 2012-09-07 LAB — TROPONIN I: Troponin I: <0.3 ng/mL (ref <0.30)

## 2012-09-07 MED ORDER — MORPHINE SULFATE 4 MG/ML IJ SOLN
4.0000 mg | Freq: Once | INTRAMUSCULAR | Status: AC
  Administered 2012-09-07: 4 mg via INTRAVENOUS
  Filled 2012-09-07: qty 1

## 2012-09-07 MED ORDER — HYDROCODONE-ACETAMINOPHEN 5-325 MG PO TABS: 2.0000 | ORAL_TABLET | ORAL | Status: DC | PRN

## 2012-09-07 MED ORDER — FUROSEMIDE 20 MG PO TABS: 20.0000 mg | ORAL_TABLET | Freq: Every day | ORAL | Status: DC

## 2012-09-07 NOTE — ED Notes (Signed)
Discharge instructions given and reviewed with patient.  Prescriptions given for Vicodin and Lasix; effects and use explained.  Patient verbalized understanding to follow up with the pain clinic regarding Vicodin prescription.  Patient discharged home in good condition.

## 2012-09-07 NOTE — ED Provider Notes (Signed)
History     CSN: [id number]  Arrival date & time 09/07/12  1654   First MD Initiated Contact with Patient 09/07/12 1708      Chief Complaint  Patient presents with  . Back Pain  . Knee Pain    (Consider location/radiation/quality/duration/timing/severity/associated sxs/prior treatment) HPI Comments: 72 y/o male with hx of low back surgery in the past - has rods and screws - presented to hisPMD with back pain and was redirected to ED for eval of possible masses in the lower back "lumps".  He has also had mild swelling of his lower extremities bialterally but denies SOB, cough, CP, fevers, diarrhea.  Sx are persistent in the lower back, worsening over last few days and not associated with weakness or numbness of the legs.  Walking makes back pain more intense.  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  He is a diabetic.  Patient is a 72 y.o. male presenting with back pain and knee pain. The history is provided by the patient and the spouse.  Back Pain Knee Pain Associated symptoms: back pain     Past Medical History  Diagnosis Date  . Heart attack   . COPD (chronic obstructive pulmonary disease)   . Colostomy status     Past Surgical History  Procedure Laterality Date  . Colostomy    . Nose surgery    . Back surgery    . Hernia repair    . Knee surgery    . Cardiac stents      No family history on file.  History  Substance Use Topics  . Smoking status: Former Games developer  . Smokeless tobacco: Not on file  . Alcohol Use: No      Review of Systems  Musculoskeletal: Positive for back pain.  All other systems reviewed and are negative.    Allergies  Aspirin; Fish allergy; Ibuprofen; Latex; Onion; Other; Levaquin; and Neosporin  Home Medications   Current Outpatient Rx  Name  Route  Sig  Dispense  Refill  . albuterol (PROAIR HFA) 108 (90 BASE) MCG/ACT inhaler   Inhalation   Inhale 2 puffs into  the lungs 4 (four) times daily.         Marland Kitchen albuterol (PROVENTIL) (2.5 MG/3ML) 0.083% nebulizer solution   Nebulization   Take 2.5 mg by nebulization every 4 (four) hours as needed for wheezing or shortness of breath.          Marland Kitchen aspirin EC 81 MG tablet   Oral   Take 81 mg by mouth daily.         . budesonide-formoterol (SYMBICORT) 160-4.5 MCG/ACT inhaler   Inhalation   Inhale 2 puffs into the lungs daily.         . cyclobenzaprine (FLEXERIL) 10 MG tablet   Oral   Take 10 mg by mouth at bedtime.         . diazepam (VALIUM) 5 MG tablet   Oral   Take 1 tablet (5 mg total) by mouth 3 (three) times daily as needed for anxiety (muscle spasms).   30 tablet   0   . diltiazem (CARDIZEM) 30 MG tablet   Oral   Take 30 mg by mouth 2 (two) times daily.         . ferrous sulfate 325 (65 FE) MG tablet   Oral   Take 325 mg by mouth daily with breakfast.         .  fexofenadine (ALLEGRA) 180 MG tablet   Oral   Take 180 mg by mouth daily.         . fluticasone (FLOVENT HFA) 110 MCG/ACT inhaler   Inhalation   Inhale 1 puff into the lungs 2 (two) times daily.         . furosemide (LASIX) 40 MG tablet   Oral   Take 40 mg by mouth daily.         Marland Kitchen gabapentin (NEURONTIN) 300 MG capsule   Oral   Take 300 mg by mouth 3 (three) times daily.         . insulin detemir (LEVEMIR) 100 UNIT/ML injection   Subcutaneous   Inject 60 Units into the skin 2 (two) times daily.         Marland Kitchen ipratropium (ATROVENT) 0.02 % nebulizer solution   Nebulization   Take 500 mcg by nebulization 4 (four) times daily.         . metFORMIN (GLUCOPHAGE) 500 MG tablet   Oral   Take 500 mg by mouth 2 (two) times daily.          Marland Kitchen oxyCODONE-acetaminophen (PERCOCET) 10-325 MG per tablet   Oral   Take 1 tablet by mouth 4 (four) times daily.         . pantoprazole (PROTONIX) 40 MG tablet   Oral   Take 40 mg by mouth daily.         . potassium chloride SA (K-DUR,KLOR-CON) 20 MEQ  tablet   Oral   Take 20 mEq by mouth daily.         . simvastatin (ZOCOR) 40 MG tablet   Oral   Take 40 mg by mouth every evening.         . temazepam (RESTORIL) 7.5 MG capsule   Oral   Take 7.5 mg by mouth at bedtime.         Marland Kitchen tiotropium (SPIRIVA) 18 MCG inhalation capsule   Inhalation   Place 18 mcg into inhaler and inhale daily.         . traZODone (DESYREL) 100 MG tablet   Oral   Take 100 mg by mouth at bedtime. Sleep         . furosemide (LASIX) 20 MG tablet   Oral   Take 1 tablet (20 mg total) by mouth daily.   10 tablet   0   . HYDROcodone-acetaminophen (NORCO/VICODIN) 5-325 MG per tablet   Oral   Take 2 tablets by mouth every 4 (four) hours as needed for pain.   10 tablet   0     BP 122/74  Pulse 107  Temp(Src) 98.2 F (36.8 C)  Resp 22  SpO2 92%  Physical Exam  Nursing note and vitals reviewed. Constitutional: He appears well-developed and well-nourished. No distress.  HENT:  Head: Normocephalic and atraumatic.  Mouth/Throat: Oropharynx is clear and moist. No oropharyngeal exudate.  Eyes: Conjunctivae and EOM are normal. Pupils are equal, round, and reactive to light. Right eye exhibits no discharge. Left eye exhibits no discharge. No scleral icterus.  Neck: No JVD present. No thyromegaly present.  Cardiovascular: Normal rate, regular rhythm, normal heart sounds and intact distal pulses.  Exam reveals no gallop and no friction rub.   No murmur heard. Pulmonary/Chest: Effort normal and breath sounds normal. No respiratory distress. He has no wheezes. He has no rales.  Abdominal: Soft. Bowel sounds are normal. He exhibits no distension and no mass. There is no tenderness.  Multiple well healed surgical scars of the abdominal wall  Musculoskeletal: He exhibits edema ( scant bil LE edema) and tenderness ( ttp ove rthe lumbar spine over the surgical scar - no lumps or bumps or masses palpated, no redness or drainage).  Lymphadenopathy:    He has  no cervical adenopathy.  Neurological: He is alert. Coordination normal.  MAEX4 without difficulty - ambulatory, able to reposition in bed with some back pain but no LE weakness or numbness.  Skin: Skin is warm and dry. No rash noted. No erythema.  Psychiatric: He has a normal mood and affect. His behavior is normal.    ED Course  Procedures (including critical care time)  Labs Reviewed  CBC WITH DIFFERENTIAL - Abnormal; Notable for the following:    MCH 25.0 (*)    RDW 16.7 (*)    All other components within normal limits  BASIC METABOLIC PANEL - Abnormal; Notable for the following:    Sodium 133 (*)    GFR calc non Af Amer 82 (*)    All other components within normal limits  PRO B NATRIURETIC PEPTIDE  TROPONIN I   Dg Chest 1 View  09/07/2012  *RADIOLOGY REPORT*  Clinical Data: Chest pain  CHEST - 1 VIEW  Comparison: Portable exam 1744 hours compared to 08/17/2012  Findings: Minimal enlargement of cardiac silhouette. Pulmonary vascularity normal. Lordotic positioning. Minimal rotation to the right, which may account for slightly increased prominence of the right paratracheal soft tissues. Bibasilar atelectasis. Remaining lungs clear. No pleural effusion or pneumothorax.  IMPRESSION: Bibasilar atelectasis. Minimal enlargement of cardiac silhouette.   Original Report Authenticated By: Ulyses Southward, M.D.    Dg Lumbar Spine Complete  09/07/2012  *RADIOLOGY REPORT*  Clinical Data: Chest pain, lower back pain, prior history of lumbar surgery  LUMBAR SPINE - COMPLETE 4+ VIEW  Comparison: None.  Findings: Osseous demineralization. Five non-rib bearing lumbar vertebrae. Prior posterior fusion of L2-L5. Hardware appears patent. Vertebral body heights maintained. Scattered disc space narrowing with vacuum phenomenon noted at L5- S1. No acute fracture, subluxation, or bone destruction. Facet degenerative changes L5-S1. Prior laminectomies of L3 and L4. Atherosclerotic calcifications aorta. SI joints  symmetric.  IMPRESSION: Posterior fusion L2-L5. Scattered degenerative disc and facet disease changes. No acute abnormalities.   Original Report Authenticated By: Ulyses Southward, M.D.      1. Back pain   2. Edema       MDM  The pt has back pain but no obvious masses, no redness of the skin - does not appear to be an abscess or infection, he has no fever but he does have increased pain in the lower back.  He has mild swelling of the LE's which could be consistent with CHF but has clear lungs, labs, xray of back to evaluate hardware, neuro intact.  Imaging reviewed, there is not appear to be any disruption of the hardware, there is no pulmonary edema, lab work reveals normal electrolytes, renal function, blood counts and a normal BNP. The patient appears stable for discharge,     Vida Roller, MD 09/07/12 1902

## 2012-09-07 NOTE — ED Notes (Signed)
Pt reports has rods and screws in his lower back.  Reprots has felt tender knots in lower back for the past 3 days and has had more difficulty walking.  Also c/o chronic left knee pain.

## 2012-09-14 ENCOUNTER — Encounter (HOSPITAL_COMMUNITY): Payer: Self-pay | Admitting: *Deleted

## 2012-09-14 ENCOUNTER — Emergency Department (HOSPITAL_COMMUNITY)
Admission: EM | Admit: 2012-09-14 | Discharge: 2012-09-14 | Disposition: A | Discharge status: Home / Self Care | Payer: Medicare Other | Attending: Emergency Medicine | Admitting: Emergency Medicine

## 2012-09-14 DIAGNOSIS — Z7982 Long term (current) use of aspirin: Secondary | ICD-10-CM | POA: Insufficient documentation

## 2012-09-14 DIAGNOSIS — Z9889 Other specified postprocedural states: Secondary | ICD-10-CM | POA: Diagnosis not present

## 2012-09-14 DIAGNOSIS — J4489 Other specified chronic obstructive pulmonary disease: Secondary | ICD-10-CM | POA: Insufficient documentation

## 2012-09-14 DIAGNOSIS — Z794 Long term (current) use of insulin: Secondary | ICD-10-CM | POA: Diagnosis not present

## 2012-09-14 DIAGNOSIS — J449 Chronic obstructive pulmonary disease, unspecified: Secondary | ICD-10-CM | POA: Diagnosis not present

## 2012-09-14 DIAGNOSIS — G8929 Other chronic pain: Secondary | ICD-10-CM | POA: Diagnosis not present

## 2012-09-14 DIAGNOSIS — M545 Low back pain, unspecified: Secondary | ICD-10-CM | POA: Diagnosis not present

## 2012-09-14 DIAGNOSIS — Z79899 Other long term (current) drug therapy: Secondary | ICD-10-CM | POA: Diagnosis not present

## 2012-09-14 DIAGNOSIS — Z87891 Personal history of nicotine dependence: Secondary | ICD-10-CM | POA: Diagnosis not present

## 2012-09-14 DIAGNOSIS — IMO0002 Reserved for concepts with insufficient information to code with codable children: Secondary | ICD-10-CM | POA: Insufficient documentation

## 2012-09-14 DIAGNOSIS — Z8674 Personal history of sudden cardiac arrest: Secondary | ICD-10-CM | POA: Diagnosis not present

## 2012-09-14 MED ORDER — ONDANSETRON HCL 4 MG/2ML IJ SOLN
4.0000 mg | Freq: Once | INTRAMUSCULAR | Status: AC
  Administered 2012-09-14: 4 mg via INTRAMUSCULAR
  Filled 2012-09-14: qty 2

## 2012-09-14 MED ORDER — HYDROMORPHONE HCL PF 2 MG/ML IJ SOLN
2.0000 mg | Freq: Once | INTRAMUSCULAR | Status: AC
  Administered 2012-09-14: 2 mg via INTRAMUSCULAR
  Filled 2012-09-14: qty 1

## 2012-09-14 MED ORDER — HYDROCODONE-ACETAMINOPHEN 5-325 MG PO TABS: 1.0000 | ORAL_TABLET | ORAL | Status: DC | PRN

## 2012-09-14 NOTE — ED Notes (Signed)
Pain in lower back radiating into left hip and knee, difficulty walking

## 2012-09-14 NOTE — ED Provider Notes (Signed)
History     This chart was scribed for Herbert Human, MD, MD by [name], ED Scribe. The patient was seen in room APA07/APA07 and the patient's care was started at 3:29 PM.   CSN: [id number]  Arrival date & time 09/14/12  1456      Chief Complaint  Patient presents with  . Tailbone Pain     The history is provided by the patient and medical records. No language interpreter was used.   Cordie Buening is a 72 y.o. male with hx of back surgery and knee surgery who presents to the Emergency Department complaining of chronic moderate lower back pain radiating to left leg that has been ongoing for months. Symptoms have been constant. He states the pain prevents him from sleeping at night. Pt goes to pain clinic (Dr. Jeralyn Bennett) but states his next appointment is in one month and he needs medication today. He reports that he has taken percocet with temporary relief. He states back pain is aggravated by walking. Pt denies fever, chills, nausea, vomiting, diarrhea, weakness, cough and any other pain.     Past Medical History  Diagnosis Date  . Heart attack   . COPD (chronic obstructive pulmonary disease)   . Colostomy status     Past Surgical History  Procedure Laterality Date  . Colostomy    . Nose surgery    . Back surgery    . Hernia repair    . Knee surgery    . Cardiac stents      No family history on file.  History  Substance Use Topics  . Smoking status: Former Games developer  . Smokeless tobacco: Not on file  . Alcohol Use: No      Review of Systems 10 Systems reviewed and all are negative for acute change except as noted in the HPI.   Allergies  Aspirin; Fish allergy; Ibuprofen; Latex; Onion; Other; Levaquin; and Neosporin  Home Medications   Current Outpatient Rx  Name  Route  Sig  Dispense  Refill  . albuterol (PROAIR HFA) 108 (90 BASE) MCG/ACT inhaler   Inhalation   Inhale 2 puffs into the lungs 4 (four) times daily.         Marland Kitchen albuterol (PROVENTIL)  (2.5 MG/3ML) 0.083% nebulizer solution   Nebulization   Take 2.5 mg by nebulization every 4 (four) hours as needed for wheezing or shortness of breath.          Marland Kitchen aspirin EC 81 MG tablet   Oral   Take 81 mg by mouth daily.         . budesonide-formoterol (SYMBICORT) 160-4.5 MCG/ACT inhaler   Inhalation   Inhale 2 puffs into the lungs daily.         . cyclobenzaprine (FLEXERIL) 10 MG tablet   Oral   Take 10 mg by mouth at bedtime.         . diazepam (VALIUM) 5 MG tablet   Oral   Take 1 tablet (5 mg total) by mouth 3 (three) times daily as needed for anxiety (muscle spasms).   30 tablet   0   . diltiazem (CARDIZEM) 30 MG tablet   Oral   Take 30 mg by mouth 2 (two) times daily.         . ferrous sulfate 325 (65 FE) MG tablet   Oral   Take 325 mg by mouth daily with breakfast.         . fexofenadine (ALLEGRA) 180 MG tablet  Oral   Take 180 mg by mouth daily.         . fluticasone (FLOVENT HFA) 110 MCG/ACT inhaler   Inhalation   Inhale 1 puff into the lungs 2 (two) times daily.         . furosemide (LASIX) 20 MG tablet   Oral   Take 1 tablet (20 mg total) by mouth daily.   10 tablet   0   . furosemide (LASIX) 40 MG tablet   Oral   Take 40 mg by mouth daily.         Marland Kitchen gabapentin (NEURONTIN) 300 MG capsule   Oral   Take 300 mg by mouth 3 (three) times daily.         Marland Kitchen HYDROcodone-acetaminophen (NORCO/VICODIN) 5-325 MG per tablet   Oral   Take 2 tablets by mouth every 4 (four) hours as needed for pain.   10 tablet   0   . insulin detemir (LEVEMIR) 100 UNIT/ML injection   Subcutaneous   Inject 60 Units into the skin 2 (two) times daily.         Marland Kitchen ipratropium (ATROVENT) 0.02 % nebulizer solution   Nebulization   Take 500 mcg by nebulization 4 (four) times daily.         . metFORMIN (GLUCOPHAGE) 500 MG tablet   Oral   Take 500 mg by mouth 2 (two) times daily.          Marland Kitchen oxyCODONE-acetaminophen (PERCOCET) 10-325 MG per tablet    Oral   Take 1 tablet by mouth 4 (four) times daily.         . pantoprazole (PROTONIX) 40 MG tablet   Oral   Take 40 mg by mouth daily.         . potassium chloride SA (K-DUR,KLOR-CON) 20 MEQ tablet   Oral   Take 20 mEq by mouth daily.         . simvastatin (ZOCOR) 40 MG tablet   Oral   Take 40 mg by mouth every evening.         . temazepam (RESTORIL) 7.5 MG capsule   Oral   Take 7.5 mg by mouth at bedtime.         Marland Kitchen tiotropium (SPIRIVA) 18 MCG inhalation capsule   Inhalation   Place 18 mcg into inhaler and inhale daily.         . traZODone (DESYREL) 100 MG tablet   Oral   Take 100 mg by mouth at bedtime. Sleep           BP 126/59  Pulse 105  Temp(Src) 98.1 F (36.7 C) (Oral)  Resp 22  Ht 5\' 7"  (1.702 m)  Wt 212 lb (96.163 kg)  BMI 33.2 kg/m2  SpO2 95%  Physical Exam  Nursing note and vitals reviewed. Constitutional: He is oriented to person, place, and time. He appears well-developed and well-nourished. No distress.  HENT:  Head: Normocephalic and atraumatic.  Eyes: EOM are normal. Pupils are equal, round, and reactive to light.  Neck: Normal range of motion. Neck supple. No tracheal deviation present.  Cardiovascular: Normal rate, regular rhythm and normal heart sounds.   Pulmonary/Chest: Effort normal. No respiratory distress.  Abdominal: Soft. He exhibits no distension.  Musculoskeletal:  Lower lumbar tenderness No deformity  No point tenderness   Neurological: He is alert and oriented to person, place, and time. He has normal strength. No sensory deficit.  Skin: Skin is warm and dry.  Psychiatric: He  has a normal mood and affect. His behavior is normal.    ED Course  Procedures (including critical care time) DIAGNOSTIC STUDIES: Oxygen Saturation is 95% on room air, adequate by my interpretation.    COORDINATION OF CARE: .3:32 PM Discussed ED treatment with pt and pt agrees.   Pt was seen and had physical examination.  Old charts  were reviewed.  Pt has chronic back pain.  He has seen Dr. Eduard Clos in the past for pain medication management.  I ordered him an injection of Dilaudid and Zofran for his current pain, and a prescription for one week's worth of hydrocodone-acetaminophen.  I advised him to have followup with Dr. Eduard Clos, to treat him for his chronic and ongoing pain.      1. Chronic back pain    I personally performed the services described in this documentation, which was scribed in my presence. The recorded information has been reviewed and is accurate.  Herbert Human, MD      Carleene Cooper III, MD 09/14/12 [phone]

## 2012-09-28 ENCOUNTER — Emergency Department (HOSPITAL_COMMUNITY): Payer: Medicare Other

## 2012-09-28 ENCOUNTER — Emergency Department (HOSPITAL_COMMUNITY)
Admission: EM | Admit: 2012-09-28 | Discharge: 2012-09-28 | Disposition: A | Discharge status: Home / Self Care | Payer: Medicare Other | Attending: Emergency Medicine | Admitting: Emergency Medicine

## 2012-09-28 ENCOUNTER — Encounter (HOSPITAL_COMMUNITY): Payer: Self-pay | Admitting: *Deleted

## 2012-09-28 DIAGNOSIS — IMO0001 Reserved for inherently not codable concepts without codable children: Secondary | ICD-10-CM | POA: Diagnosis not present

## 2012-09-28 DIAGNOSIS — Z7982 Long term (current) use of aspirin: Secondary | ICD-10-CM | POA: Diagnosis not present

## 2012-09-28 DIAGNOSIS — Z0389 Encounter for observation for other suspected diseases and conditions ruled out: Secondary | ICD-10-CM | POA: Diagnosis not present

## 2012-09-28 DIAGNOSIS — J438 Other emphysema: Secondary | ICD-10-CM | POA: Diagnosis not present

## 2012-09-28 DIAGNOSIS — Z79899 Other long term (current) drug therapy: Secondary | ICD-10-CM | POA: Insufficient documentation

## 2012-09-28 DIAGNOSIS — Z933 Colostomy status: Secondary | ICD-10-CM | POA: Insufficient documentation

## 2012-09-28 DIAGNOSIS — I252 Old myocardial infarction: Secondary | ICD-10-CM | POA: Diagnosis not present

## 2012-09-28 DIAGNOSIS — Z87891 Personal history of nicotine dependence: Secondary | ICD-10-CM | POA: Insufficient documentation

## 2012-09-28 DIAGNOSIS — Z9889 Other specified postprocedural states: Secondary | ICD-10-CM | POA: Diagnosis not present

## 2012-09-28 DIAGNOSIS — M791 Myalgia, unspecified site: Secondary | ICD-10-CM

## 2012-09-28 DIAGNOSIS — M25569 Pain in unspecified knee: Secondary | ICD-10-CM | POA: Insufficient documentation

## 2012-09-28 DIAGNOSIS — J449 Chronic obstructive pulmonary disease, unspecified: Secondary | ICD-10-CM | POA: Insufficient documentation

## 2012-09-28 DIAGNOSIS — J4489 Other specified chronic obstructive pulmonary disease: Secondary | ICD-10-CM | POA: Insufficient documentation

## 2012-09-28 DIAGNOSIS — R52 Pain, unspecified: Secondary | ICD-10-CM | POA: Insufficient documentation

## 2012-09-28 DIAGNOSIS — G8929 Other chronic pain: Secondary | ICD-10-CM | POA: Insufficient documentation

## 2012-09-28 DIAGNOSIS — M1710 Unilateral primary osteoarthritis, unspecified knee: Secondary | ICD-10-CM | POA: Diagnosis not present

## 2012-09-28 LAB — COMPREHENSIVE METABOLIC PANEL
ALT: 11 U/L (ref 0–53)
Alkaline Phosphatase: 60 U/L (ref 39–117)
CO2: 27 mEq/L (ref 19–32)
Calcium: 9.3 mg/dL (ref 8.4–10.5)
GFR calc Af Amer: >90 mL/min (ref >90)
GFR calc non Af Amer: 83 mL/min — ABNORMAL LOW (ref >90)
Glucose, Bld: 104 mg/dL — ABNORMAL HIGH (ref 70–99)
Potassium: 4.1 mEq/L (ref 3.5–5.1)
Sodium: 138 mEq/L (ref 135–145)

## 2012-09-28 LAB — CBC WITH DIFFERENTIAL/PLATELET
Eosinophils Relative: 2 % (ref 0–5)
Hemoglobin: 14.6 g/dL (ref 13.0–17.0)
Lymphocytes Relative: 28 % (ref 12–46)
Lymphs Abs: 2.5 10*3/uL (ref 0.7–4.0)
MCV: 78.1 fL (ref 78.0–100.0)
Monocytes Relative: 9 % (ref 3–12)
Platelets: 268 10*3/uL (ref 150–400)
RBC: 5.79 MIL/uL (ref 4.22–5.81)
WBC: 8.9 10*3/uL (ref 4.0–10.5)

## 2012-09-28 LAB — URINALYSIS, ROUTINE W REFLEX MICROSCOPIC
Bilirubin Urine: NEGATIVE
Glucose, UA: NEGATIVE mg/dL
Hgb urine dipstick: NEGATIVE
Ketones, ur: NEGATIVE mg/dL
Protein, ur: NEGATIVE mg/dL

## 2012-09-28 MED ORDER — OXYCODONE-ACETAMINOPHEN 5-325 MG PO TABS: 2.0000 | ORAL_TABLET | ORAL | Status: DC | PRN

## 2012-09-28 NOTE — ED Notes (Signed)
Pt c/o generalized body aches, left knee pain for past 3 days per pt, denies fever or N/V/D
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
## 2012-10-04 DIAGNOSIS — M545 Low back pain: Secondary | ICD-10-CM | POA: Diagnosis not present

## 2012-10-04 DIAGNOSIS — G894 Chronic pain syndrome: Secondary | ICD-10-CM | POA: Diagnosis not present

## 2012-10-04 DIAGNOSIS — M19019 Primary osteoarthritis, unspecified shoulder: Secondary | ICD-10-CM | POA: Diagnosis not present

## 2012-10-04 DIAGNOSIS — M542 Cervicalgia: Secondary | ICD-10-CM | POA: Diagnosis not present

## 2012-10-04 DIAGNOSIS — M25519 Pain in unspecified shoulder: Secondary | ICD-10-CM | POA: Diagnosis not present

## 2012-10-04 DIAGNOSIS — M25569 Pain in unspecified knee: Secondary | ICD-10-CM | POA: Diagnosis not present

## 2012-10-04 DIAGNOSIS — M961 Postlaminectomy syndrome, not elsewhere classified: Secondary | ICD-10-CM | POA: Diagnosis not present

## 2012-10-18 ENCOUNTER — Other Ambulatory Visit: Payer: Self-pay

## 2012-10-18 MED ORDER — BUDESONIDE-FORMOTEROL FUMARATE 160-4.5 MCG/ACT IN AERO: 2.0000 | INHALATION_SPRAY | Freq: Every day | RESPIRATORY_TRACT | Status: DC

## 2012-10-20 ENCOUNTER — Emergency Department (HOSPITAL_COMMUNITY)
Admission: EM | Admit: 2012-10-20 | Discharge: 2012-10-20 | Discharge status: Against Medical Advice | Payer: Medicare Other | Attending: Emergency Medicine | Admitting: Emergency Medicine

## 2012-10-20 ENCOUNTER — Encounter (HOSPITAL_COMMUNITY): Payer: Self-pay | Admitting: *Deleted

## 2012-10-20 DIAGNOSIS — Z91040 Latex allergy status: Secondary | ICD-10-CM | POA: Insufficient documentation

## 2012-10-20 DIAGNOSIS — Z87891 Personal history of nicotine dependence: Secondary | ICD-10-CM | POA: Insufficient documentation

## 2012-10-20 DIAGNOSIS — M545 Low back pain, unspecified: Secondary | ICD-10-CM | POA: Insufficient documentation

## 2012-10-20 DIAGNOSIS — Z933 Colostomy status: Secondary | ICD-10-CM | POA: Diagnosis not present

## 2012-10-20 DIAGNOSIS — Z79899 Other long term (current) drug therapy: Secondary | ICD-10-CM | POA: Insufficient documentation

## 2012-10-20 DIAGNOSIS — I252 Old myocardial infarction: Secondary | ICD-10-CM | POA: Insufficient documentation

## 2012-10-20 DIAGNOSIS — IMO0002 Reserved for concepts with insufficient information to code with codable children: Secondary | ICD-10-CM | POA: Insufficient documentation

## 2012-10-20 DIAGNOSIS — G8929 Other chronic pain: Secondary | ICD-10-CM | POA: Insufficient documentation

## 2012-10-20 DIAGNOSIS — Z9889 Other specified postprocedural states: Secondary | ICD-10-CM | POA: Diagnosis not present

## 2012-10-20 DIAGNOSIS — J4489 Other specified chronic obstructive pulmonary disease: Secondary | ICD-10-CM | POA: Insufficient documentation

## 2012-10-20 DIAGNOSIS — J449 Chronic obstructive pulmonary disease, unspecified: Secondary | ICD-10-CM | POA: Diagnosis not present

## 2012-10-20 DIAGNOSIS — Z794 Long term (current) use of insulin: Secondary | ICD-10-CM | POA: Diagnosis not present

## 2012-10-20 DIAGNOSIS — M25559 Pain in unspecified hip: Secondary | ICD-10-CM | POA: Insufficient documentation

## 2012-10-20 DIAGNOSIS — IMO0001 Reserved for inherently not codable concepts without codable children: Secondary | ICD-10-CM | POA: Diagnosis not present

## 2012-10-20 NOTE — ED Notes (Signed)
Back and left hip pain for 2 days, No recent injury

## 2012-10-20 NOTE — ED Provider Notes (Signed)
History    This chart was scribed for Ward Givens, MD by Marlyne Beards, ED Scribe. The patient was seen in room APA05/APA05. Patient's care was started at 9:38 PM.    CSN: [id number]  Arrival date & time 10/20/12  1854   First MD Initiated Contact with Patient 10/20/12 2114      Chief Complaint  Patient presents with  . Back Pain    (Consider location/radiation/quality/duration/timing/severity/associated sxs/prior treatment) Patient is a 72 y.o. male presenting with back pain. The history is provided by the patient. No language interpreter was used.  Back Pain Location:  Generalized Quality:  Aching Pain severity:  Moderate Pain is:  Same all the time Onset quality:  Gradual Duration:  3 days Timing:  Constant Progression:  Worsening Worsened by:  Movement  Herbert May is a 72 y.o. male who presents to the Emergency Department complaining of moderate constant back and left hip pain onset for 3 days. He states he has had back pain for 20 years. Pt denies any recent injury to the affected area.  Pt states that he's had screws in his back from a previous surgery 20 years ago. He states that his left hip is bothering him immensely with mild numbness which started 2 days ago as well. Pt states that the pain is sharp and achy and wont let up. He states that movement exacerbates pain. Pt denies bladder/bowel incontinence, fever, chills, cough, nausea, vomiting, diarrhea, SOB, weakness, and any other associated symptoms. Pt states that Dr. Eduard Clos, chronic pain doctor, gave him an injection in his back and his shoulder 2 weeks ago that helped a lot. Pt has a pain contract with Dr. Eduard Clos. Pt's PCP is Dr. Christell Constant. Pt denies any bladder/bowel incontinence, fever, chills, cough, nausea, vomiting, diarrhea, SOB, weakness, and any other associated symptoms.  Movement/walking makes the pain worse. Dr Eduard Clos gave him a salve that was helping his pain.   Review of [facility] shows he has been on fentanyl  patches 100 mcg, but his last visit ion 4/7 he was given 75 and 25 mcg patches and he is only using the 25 mcg patches.    Pt's current PCP is Dr. Christell Constant. Pt states he lives with his fiancee. Pt states he takes percocet for his pain and has a 75 mg fentanyl patch on his right leg. Dr. Eduard Clos prescribed a 25 mg patch as well but pt refuses to put both on. He was given 10 patches each. He was also given #120 percocet 10/325 mg on 4/7.   PCP Dr Christell Constant Pain Management Dr Eduard Clos   Past Medical History  Diagnosis Date  . Heart attack   . COPD (chronic obstructive pulmonary disease)   . Colostomy status     Past Surgical History  Procedure Laterality Date  . Colostomy    . Nose surgery    . Back surgery    . Hernia repair    . Knee surgery    . Cardiac stents    . Colostomy closure      History reviewed. No pertinent family history.  History  Substance Use Topics  . Smoking status: Former Games developer  . Smokeless tobacco: Not on file  . Alcohol Use: No   Lives at home  Lives with fianc of 33 years   Review of Systems  Musculoskeletal: Positive for myalgias, back pain and arthralgias.  All other systems reviewed and are negative.    Allergies  Aspirin; Fish allergy; Ibuprofen; Latex; Onion; Other; Levaquin;  and Neosporin  Home Medications   Current Outpatient Rx  Name  Route  Sig  Dispense  Refill  . albuterol (PROAIR HFA) 108 (90 BASE) MCG/ACT inhaler   Inhalation   Inhale 2 puffs into the lungs 4 (four) times daily.         Marland Kitchen albuterol (PROVENTIL) (2.5 MG/3ML) 0.083% nebulizer solution   Nebulization   Take 2.5 mg by nebulization every 4 (four) hours as needed for wheezing or shortness of breath.          . budesonide-formoterol (SYMBICORT) 160-4.5 MCG/ACT inhaler   Inhalation   Inhale 2 puffs into the lungs daily.   1 Inhaler   4   . cyclobenzaprine (FLEXERIL) 10 MG tablet   Oral   Take 10 mg by mouth at bedtime.         . diazepam (VALIUM) 5 MG  tablet   Oral   Take 1 tablet (5 mg total) by mouth 3 (three) times daily as needed for anxiety (muscle spasms).   30 tablet   0   . diltiazem (CARDIZEM) 30 MG tablet   Oral   Take 30 mg by mouth 2 (two) times daily.         . ferrous sulfate 325 (65 FE) MG tablet   Oral   Take 325 mg by mouth daily with breakfast.         . fexofenadine (ALLEGRA) 180 MG tablet   Oral   Take 180 mg by mouth daily.         . fluticasone (FLOVENT HFA) 110 MCG/ACT inhaler   Inhalation   Inhale 1 puff into the lungs 2 (two) times daily.         Marland Kitchen gabapentin (NEURONTIN) 300 MG capsule   Oral   Take 300 mg by mouth 3 (three) times daily.         . insulin detemir (LEVEMIR) 100 UNIT/ML injection   Subcutaneous   Inject 60 Units into the skin 2 (two) times daily.         Marland Kitchen ipratropium (ATROVENT) 0.02 % nebulizer solution   Nebulization   Take 500 mcg by nebulization 4 (four) times daily.         . metFORMIN (GLUCOPHAGE) 500 MG tablet   Oral   Take 500 mg by mouth 2 (two) times daily.          Marland Kitchen oxyCODONE-acetaminophen (PERCOCET) 10-325 MG per tablet   Oral   Take 1 tablet by mouth 4 (four) times daily.         . pantoprazole (PROTONIX) 40 MG tablet   Oral   Take 40 mg by mouth daily.         . potassium chloride SA (K-DUR,KLOR-CON) 20 MEQ tablet   Oral   Take 20 mEq by mouth daily.         . simvastatin (ZOCOR) 40 MG tablet   Oral   Take 40 mg by mouth every evening.         . temazepam (RESTORIL) 7.5 MG capsule   Oral   Take 7.5 mg by mouth at bedtime.         Marland Kitchen tiotropium (SPIRIVA) 18 MCG inhalation capsule   Inhalation   Place 18 mcg into inhaler and inhale daily.         . traZODone (DESYREL) 100 MG tablet   Oral   Take 100 mg by mouth at bedtime. Sleep  BP 127/60  Pulse 70  Temp(Src) 98.2 F (36.8 C) (Oral)  Resp 22  Ht 5\' 7"  (1.702 m)  Wt 212 lb (96.163 kg)  BMI 33.2 kg/m2  SpO2 96%  Vital signs normal    Physical  Exam  Nursing note and vitals reviewed. Constitutional: He is oriented to person, place, and time. He appears well-developed and well-nourished.  HENT:  Head: Normocephalic and atraumatic.  Eyes: Conjunctivae and EOM are normal. Pupils are equal, round, and reactive to light.  Neck: Normal range of motion. Neck supple.  Cardiovascular: Normal rate, regular rhythm and normal heart sounds.   Pulmonary/Chest: Effort normal and breath sounds normal.  Musculoskeletal: He exhibits tenderness.       Lumbar back: He exhibits decreased range of motion, tenderness, bony tenderness, pain and spasm. He exhibits no swelling, no edema and no deformity.       Back:  Tender in lumbar spine diffusely no localization. Inaffective patella reflex on left knee due to no knee cap. Reflex +2 on the right. Tender in a well localized spot in his prox lateral thigh/pelvis next to his wallet in his pants.   Neurological: He is alert and oriented to person, place, and time.  Skin: Skin is warm, dry and intact. He is not diaphoretic. No erythema. No pallor.    ED Course  Procedures (including critical care time) DIAGNOSTIC STUDIES: Oxygen Saturation is 96% on room air, adequate by my interpretation.    COORDINATION OF CARE:   Pt was very hostile and didn't want to answer questions during his interview. He was seen walking out of the ED with his family member.   Pt left AMA after exam, before medications could be written.   Pt has been to the ED 10 times in the past 6 months and this is his 6th ED visit for his chronic back pain since February.    1. Chronic back pain     Pt left AMA   Devoria Albe, MD, FACEP   MDM   I personally performed the services described in this documentation, which was scribed in my presence. The recorded information has been reviewed and considered.  Devoria Albe, MD, FACEP         Ward Givens, MD 10/21/12 [phone]

## 2012-10-20 NOTE — ED Notes (Signed)
Pt left without informing staff.  

## 2012-10-26 ENCOUNTER — Telehealth: Payer: Self-pay | Admitting: *Deleted

## 2012-10-26 MED ORDER — LIDOCAINE 5 % EX PTCH: 1.0000 | MEDICATED_PATCH | CUTANEOUS | Status: DC

## 2012-10-26 NOTE — Telephone Encounter (Signed)
RECEIVED A REQUEST FROM [facility] IN [place] THAT PATIENT WAS ON LIDOCAINE PATCH 5% APPLY ONE QD #30 FROM DR. IN [place]. PT WANTS [place] TO PRESCRIBE. I COULDN'T FIND MED IN [employer] TO PUT IN COMPUTER FOR YOU. PLEASE REVIEW. THANKS. [facility] [id number].

## 2012-10-26 NOTE — Telephone Encounter (Signed)
Patches called in

## 2012-11-04 ENCOUNTER — Emergency Department (HOSPITAL_COMMUNITY)
Admission: EM | Admit: 2012-11-04 | Discharge: 2012-11-04 | Disposition: A | Discharge status: Home / Self Care | Payer: Medicare Other | Attending: Emergency Medicine | Admitting: Emergency Medicine

## 2012-11-04 ENCOUNTER — Encounter (HOSPITAL_COMMUNITY): Payer: Self-pay | Admitting: *Deleted

## 2012-11-04 DIAGNOSIS — IMO0002 Reserved for concepts with insufficient information to code with codable children: Secondary | ICD-10-CM | POA: Diagnosis not present

## 2012-11-04 DIAGNOSIS — Z91040 Latex allergy status: Secondary | ICD-10-CM | POA: Insufficient documentation

## 2012-11-04 DIAGNOSIS — R05 Cough: Secondary | ICD-10-CM | POA: Insufficient documentation

## 2012-11-04 DIAGNOSIS — M25559 Pain in unspecified hip: Secondary | ICD-10-CM | POA: Insufficient documentation

## 2012-11-04 DIAGNOSIS — J449 Chronic obstructive pulmonary disease, unspecified: Secondary | ICD-10-CM | POA: Insufficient documentation

## 2012-11-04 DIAGNOSIS — R059 Cough, unspecified: Secondary | ICD-10-CM | POA: Diagnosis not present

## 2012-11-04 DIAGNOSIS — R011 Cardiac murmur, unspecified: Secondary | ICD-10-CM | POA: Diagnosis not present

## 2012-11-04 DIAGNOSIS — Z9889 Other specified postprocedural states: Secondary | ICD-10-CM | POA: Insufficient documentation

## 2012-11-04 DIAGNOSIS — Z933 Colostomy status: Secondary | ICD-10-CM | POA: Insufficient documentation

## 2012-11-04 DIAGNOSIS — M436 Torticollis: Secondary | ICD-10-CM | POA: Diagnosis not present

## 2012-11-04 DIAGNOSIS — Z794 Long term (current) use of insulin: Secondary | ICD-10-CM | POA: Diagnosis not present

## 2012-11-04 DIAGNOSIS — G8929 Other chronic pain: Secondary | ICD-10-CM | POA: Diagnosis not present

## 2012-11-04 DIAGNOSIS — M549 Dorsalgia, unspecified: Secondary | ICD-10-CM | POA: Diagnosis not present

## 2012-11-04 DIAGNOSIS — Z79899 Other long term (current) drug therapy: Secondary | ICD-10-CM | POA: Insufficient documentation

## 2012-11-04 DIAGNOSIS — M26629 Arthralgia of temporomandibular joint, unspecified side: Secondary | ICD-10-CM | POA: Insufficient documentation

## 2012-11-04 DIAGNOSIS — J4489 Other specified chronic obstructive pulmonary disease: Secondary | ICD-10-CM | POA: Insufficient documentation

## 2012-11-04 DIAGNOSIS — Z8674 Personal history of sudden cardiac arrest: Secondary | ICD-10-CM | POA: Diagnosis not present

## 2012-11-04 DIAGNOSIS — Z87891 Personal history of nicotine dependence: Secondary | ICD-10-CM | POA: Insufficient documentation

## 2012-11-04 MED ORDER — FENTANYL CITRATE 0.05 MG/ML IJ SOLN
50.0000 ug | Freq: Once | INTRAMUSCULAR | Status: AC
  Administered 2012-11-04: 50 ug via INTRAMUSCULAR
  Filled 2012-11-04: qty 2

## 2012-11-04 MED ORDER — DEXAMETHASONE SODIUM PHOSPHATE 4 MG/ML IJ SOLN
8.0000 mg | Freq: Once | INTRAMUSCULAR | Status: AC
  Administered 2012-11-04: 8 mg via INTRAMUSCULAR
  Filled 2012-11-04: qty 2

## 2012-11-04 MED ORDER — ONDANSETRON HCL 4 MG PO TABS
4.0000 mg | ORAL_TABLET | Freq: Once | ORAL | Status: AC
  Administered 2012-11-04: 4 mg via ORAL
  Filled 2012-11-04: qty 1

## 2012-11-04 NOTE — ED Provider Notes (Signed)
History     CSN: [id number]  Arrival date & time 11/04/12  1612   First MD Initiated Contact with Patient 11/04/12 1648      Chief Complaint  Patient presents with  . Back Pain    (Consider location/radiation/quality/duration/timing/severity/associated sxs/prior treatment) HPI Comments: Patient is a 72 year old male who is seen by Dr. Christell Constant. The patient has a history of chronic obstructive pulmonary disease, chronic back pain, heart attack, multiple surgeries involving the abdomen back and knees. The patient presents to the emergency department today with 4-5 days history of pain to the left hip. He also complains of pain just in front of his years for approximately one week the patient has not had any recent injury or fall. It is of note that he has had surgeries on his back and has" 8 screws" in his back. He is seen by a primary care physician and a pain management specialist concerning pain in his back and uses fentanyl patches as well as Flexeril and Percocet to assist with his pain. The patient states that in spite of this he still has breakthrough pain from time to time he presents to the emergency department at this time for assistance with this pain. The patient denies any injury to the temporal areas or face. He complains of pain just in front of his ear when he yawns or chews. He's not had any drainage from the ear, no fever, no changes in his hearing. He has not taken any medication for this particular problem.  The history is provided by the patient.    Past Medical History  Diagnosis Date  . Heart attack   . COPD (chronic obstructive pulmonary disease)   . Colostomy status     Past Surgical History  Procedure Laterality Date  . Colostomy    . Nose surgery    . Back surgery    . Hernia repair    . Knee surgery    . Cardiac stents    . Colostomy closure      History reviewed. No pertinent family history.  History  Substance Use Topics  . Smoking status: Former  Games developer  . Smokeless tobacco: Not on file  . Alcohol Use: No      Review of Systems  Constitutional: Negative for activity change.       All ROS Neg except as noted in HPI  HENT: Positive for neck stiffness. Negative for nosebleeds, facial swelling, neck pain and ear discharge.   Eyes: Negative for photophobia and discharge.  Respiratory: Positive for cough. Negative for shortness of breath and wheezing.   Cardiovascular: Negative for chest pain and palpitations.  Gastrointestinal: Negative for abdominal pain and blood in stool.  Genitourinary: Negative for dysuria, frequency and hematuria.  Musculoskeletal: Positive for back pain, arthralgias and gait problem.  Skin: Negative.   Neurological: Negative for dizziness, seizures and speech difficulty.  Psychiatric/Behavioral: Negative for hallucinations and confusion.    Allergies  Aspirin; Fish allergy; Ibuprofen; Latex; Onion; Other; Levaquin; and Neosporin  Home Medications   Current Outpatient Rx  Name  Route  Sig  Dispense  Refill  . albuterol (PROAIR HFA) 108 (90 BASE) MCG/ACT inhaler   Inhalation   Inhale 2 puffs into the lungs 4 (four) times daily.         Marland Kitchen albuterol (PROVENTIL) (2.5 MG/3ML) 0.083% nebulizer solution   Nebulization   Take 2.5 mg by nebulization every 4 (four) hours as needed for wheezing or shortness of breath.          Marland Kitchen  budesonide-formoterol (SYMBICORT) 160-4.5 MCG/ACT inhaler   Inhalation   Inhale 2 puffs into the lungs daily.   1 Inhaler   4   . cyclobenzaprine (FLEXERIL) 10 MG tablet   Oral   Take 10 mg by mouth at bedtime.         . diazepam (VALIUM) 5 MG tablet   Oral   Take 1 tablet (5 mg total) by mouth 3 (three) times daily as needed for anxiety (muscle spasms).   30 tablet   0   . diltiazem (CARDIZEM) 30 MG tablet   Oral   Take 30 mg by mouth 2 (two) times daily.         . ferrous sulfate 325 (65 FE) MG tablet   Oral   Take 325 mg by mouth daily with breakfast.          . fexofenadine (ALLEGRA) 180 MG tablet   Oral   Take 180 mg by mouth daily.         . fluticasone (FLOVENT HFA) 110 MCG/ACT inhaler   Inhalation   Inhale 1 puff into the lungs 2 (two) times daily.         Marland Kitchen gabapentin (NEURONTIN) 300 MG capsule   Oral   Take 300 mg by mouth 3 (three) times daily.         . insulin detemir (LEVEMIR) 100 UNIT/ML injection   Subcutaneous   Inject 60 Units into the skin 2 (two) times daily.         Marland Kitchen ipratropium (ATROVENT) 0.02 % nebulizer solution   Nebulization   Take 500 mcg by nebulization 4 (four) times daily.         Marland Kitchen lidocaine (LIDODERM) 5 %   Transdermal   Place 1 patch onto the skin daily. Remove & Discard patch within 12 hours or as directed by MD   30 patch   0   . metFORMIN (GLUCOPHAGE) 500 MG tablet   Oral   Take 500 mg by mouth 2 (two) times daily.          Marland Kitchen oxyCODONE-acetaminophen (PERCOCET) 10-325 MG per tablet   Oral   Take 1 tablet by mouth 4 (four) times daily.         . pantoprazole (PROTONIX) 40 MG tablet   Oral   Take 40 mg by mouth daily.         . potassium chloride SA (K-DUR,KLOR-CON) 20 MEQ tablet   Oral   Take 20 mEq by mouth daily.         . simvastatin (ZOCOR) 40 MG tablet   Oral   Take 40 mg by mouth every evening.         . temazepam (RESTORIL) 7.5 MG capsule   Oral   Take 7.5 mg by mouth at bedtime.         Marland Kitchen tiotropium (SPIRIVA) 18 MCG inhalation capsule   Inhalation   Place 18 mcg into inhaler and inhale daily.         . traZODone (DESYREL) 100 MG tablet   Oral   Take 100 mg by mouth at bedtime. Sleep           BP 127/54  Pulse 74  Temp(Src) 97.5 F (36.4 C) (Oral)  Resp 18  Ht 5\' 7"  (1.702 m)  Wt 220 lb (99.791 kg)  BMI 34.45 kg/m2  SpO2 98%  Physical Exam  Nursing note and vitals reviewed. Constitutional: He is oriented to person, place, and time. He  appears well-developed and well-nourished.  Non-toxic appearance.  HENT:  Head: Normocephalic.   Right Ear: Tympanic membrane and external ear normal.  Left Ear: Tympanic membrane and external ear normal.  Soreness of the TMJ on the right and left with amild crepitus.  Eyes: EOM and lids are normal. Pupils are equal, round, and reactive to light.  Neck: Normal range of motion. Carotid bruit is not present.  Mild to mod stiffness  Cardiovascular: Normal rate, regular rhythm, intact distal pulses and normal pulses.   Murmur heard. Pulmonary/Chest: No respiratory distress. He has rhonchi.  Abdominal: Soft. Bowel sounds are normal. There is no tenderness. There is no guarding.  Multiple well healed surgical scars. No mass or pulsating mass.  Musculoskeletal: Normal range of motion.  PUlses 1+ bilat. No temp or color changes of extremities.  There is pain to palpation of the lumbar spine, particularly over the well-healed surgical scar area. There is paraspinal tenderness and spasm on the right and on the left. No hot areas appreciated.  The patient has a surgically removed kneecap on the left with some decrease in range of motion of the left knee. No hot joints appreciated or effusion noted in the area.  Lymphadenopathy:       Head (right side): No submandibular adenopathy present.       Head (left side): No submandibular adenopathy present.    He has no cervical adenopathy.  Neurological: He is alert and oriented to person, place, and time. He has normal strength. No cranial nerve deficit or sensory deficit. He exhibits normal muscle tone. Coordination normal.  Skin: Skin is warm and dry.  Psychiatric: He has a normal mood and affect. His speech is normal.    ED Course  Procedures (including critical care time)  Labs Reviewed - No data to display No results found.   No diagnosis found.    MDM  I have reviewed nursing notes, vital signs, and all appropriate lab and imaging results for this patient. Patient seen with me by Dr. Deretha Emory. Patient has history of chronic back  hip and knee pains. He also complains of pain today in his temporomandibular joint areas. He is noted to have some mild crepitus in this area. The patient's previous emergency department evaluations have been reviewed, and no new findings appreciated.  Patient is on chronic pain management with fentanyl patches and Percocet. The patient states that today is his third day and he is off of the patches, which often leads to breakthrough pain. The patient is treated with an injection of fentanyl and an injection of Decadron. Patient is to followup with his primary physician and continue his current pain management.       Kathie Dike, PA-C 11/04/12 1722  Kathie Dike, PA-C 11/04/12 1727

## 2012-11-04 NOTE — ED Notes (Signed)
Lt hip pain for 4-5 days, low back pain, bil ears hurt.  X 1 week

## 2012-11-04 NOTE — ED Provider Notes (Signed)
Medical screening examination/treatment/procedure(s) were conducted as a shared visit with non-physician practitioner(s) and myself.  I personally evaluated the patient during the encounter  Patient seen by me. The patient has a history of chronic back pain does also have some left hip pain for the past 4-5 days. Also has bilateral TMJ 9 as an activating up as well. Patient is followed by chronic pain. Patient will need a little additional help with the pain medication concur with the mid-level so assessment and plan. Patient on physical exam has no significant neuro deficits.  Shelda Jakes, MD 11/04/12 [phone]

## 2012-11-22 ENCOUNTER — Other Ambulatory Visit: Payer: Self-pay | Admitting: *Deleted

## 2012-11-22 MED ORDER — POTASSIUM CHLORIDE CRYS ER 20 MEQ PO TBCR: 20.0000 meq | EXTENDED_RELEASE_TABLET | Freq: Every day | ORAL | Status: DC

## 2012-11-22 MED ORDER — METFORMIN HCL 500 MG PO TABS: 500.0000 mg | ORAL_TABLET | Freq: Two times a day (BID) | ORAL | Status: DC

## 2012-11-22 MED ORDER — SIMVASTATIN 40 MG PO TABS: 40.0000 mg | ORAL_TABLET | Freq: Every evening | ORAL | Status: DC

## 2012-11-22 NOTE — Telephone Encounter (Signed)
LAST LABS 3/14. K+4.2. AIC 6.1

## 2012-11-24 ENCOUNTER — Other Ambulatory Visit: Payer: Self-pay | Admitting: *Deleted

## 2012-11-24 MED ORDER — FUROSEMIDE 40 MG PO TABS: 40.0000 mg | ORAL_TABLET | Freq: Every day | ORAL | Status: DC

## 2012-11-24 NOTE — Telephone Encounter (Signed)
DIDN'T SEE MED IN EPIC BUT WAS IN PAPER CHART.

## 2012-11-28 ENCOUNTER — Other Ambulatory Visit: Payer: Self-pay

## 2012-11-28 DIAGNOSIS — M961 Postlaminectomy syndrome, not elsewhere classified: Secondary | ICD-10-CM | POA: Diagnosis not present

## 2012-11-28 MED ORDER — PANTOPRAZOLE SODIUM 40 MG PO TBEC: 40.0000 mg | DELAYED_RELEASE_TABLET | Freq: Every day | ORAL | Status: DC

## 2012-11-29 DIAGNOSIS — M79609 Pain in unspecified limb: Secondary | ICD-10-CM | POA: Diagnosis not present

## 2012-11-29 DIAGNOSIS — M545 Low back pain: Secondary | ICD-10-CM | POA: Diagnosis not present

## 2012-11-29 DIAGNOSIS — M47817 Spondylosis without myelopathy or radiculopathy, lumbosacral region: Secondary | ICD-10-CM | POA: Diagnosis not present

## 2012-12-08 ENCOUNTER — Other Ambulatory Visit: Payer: Self-pay | Admitting: *Deleted

## 2012-12-08 MED ORDER — TIOTROPIUM BROMIDE MONOHYDRATE 18 MCG IN CAPS: 18.0000 ug | ORAL_CAPSULE | Freq: Every day | RESPIRATORY_TRACT | Status: DC

## 2012-12-12 ENCOUNTER — Encounter: Payer: Self-pay | Admitting: Family Medicine

## 2012-12-12 ENCOUNTER — Ambulatory Visit (INDEPENDENT_AMBULATORY_CARE_PROVIDER_SITE_OTHER): Payer: Medicare Other | Admitting: Family Medicine

## 2012-12-12 VITALS — BP 116/81 | HR 75 | Temp 97.3°F | Ht 67.0 in | Wt 227.2 lb

## 2012-12-12 DIAGNOSIS — R0989 Other specified symptoms and signs involving the circulatory and respiratory systems: Secondary | ICD-10-CM | POA: Diagnosis not present

## 2012-12-12 DIAGNOSIS — E119 Type 2 diabetes mellitus without complications: Secondary | ICD-10-CM

## 2012-12-12 DIAGNOSIS — E785 Hyperlipidemia, unspecified: Secondary | ICD-10-CM | POA: Diagnosis not present

## 2012-12-12 DIAGNOSIS — R0609 Other forms of dyspnea: Secondary | ICD-10-CM

## 2012-12-12 DIAGNOSIS — I25810 Atherosclerosis of coronary artery bypass graft(s) without angina pectoris: Secondary | ICD-10-CM

## 2012-12-12 DIAGNOSIS — R0683 Snoring: Secondary | ICD-10-CM

## 2012-12-12 LAB — COMPLETE METABOLIC PANEL WITH GFR
ALT: 12 U/L (ref 0–53)
AST: 11 U/L (ref 0–37)
Albumin: 4.3 g/dL (ref 3.5–5.2)
Alkaline Phosphatase: 63 U/L (ref 39–117)
BUN: 16 mg/dL (ref 6–23)
CO2: 29 mEq/L (ref 19–32)
Calcium: 9.7 mg/dL (ref 8.4–10.5)
Chloride: 101 mEq/L (ref 96–112)
Creat: 1.09 mg/dL (ref 0.50–1.35)
GFR, Est African American: 79 mL/min
GFR, Est Non African American: 68 mL/min
Glucose, Bld: 96 mg/dL (ref 70–99)
Potassium: 5.2 mEq/L (ref 3.5–5.3)
Sodium: 139 mEq/L (ref 135–145)
Total Bilirubin: 0.5 mg/dL (ref 0.3–1.2)
Total Protein: 6.7 g/dL (ref 6.0–8.3)

## 2012-12-12 LAB — POCT GLYCOSYLATED HEMOGLOBIN (HGB A1C): Hemoglobin A1C: 6.1

## 2012-12-12 NOTE — Progress Notes (Signed)
  Subjective:    Patient ID: Herbert May, male    DOB: 10-Oct-1940, 72 y.o.   MRN: [id number]  HPI This 72 y.o. male presents for evaluation of COPD, CAD, GERD, and HTN.  He states he has 3 coronary artery stents and needs to see a cardiologist.  He has moved from [place] and he states he has not seen cardiology for over a year.  He has hx of COPD.  He has quit smoking.  He is accompanied by his daughter and wife who state he has problems with hypersomnia and falls asleep easily throughout the day.  His daughter states she is concerned about him having narcolepsy.    Review of Systems  Constitutional: Positive for fatigue. Negative for fever, chills, diaphoresis, activity change, appetite change and unexpected weight change.  HENT: Negative.   Eyes: Negative.   Respiratory: Negative.   Cardiovascular: Negative.   Gastrointestinal: Negative.   Genitourinary: Negative.   Musculoskeletal: Negative.         Objective:   Physical Exam  Constitutional: He appears well-developed and well-nourished.  HENT:  Head: Normocephalic and atraumatic.  Eyes: Conjunctivae are normal. Pupils are equal, round, and reactive to light.  Neck: Normal range of motion. Neck supple.  Cardiovascular: Normal rate and regular rhythm.   Pulmonary/Chest: Effort normal and breath sounds normal.          Assessment & Plan:    Diabetes - Plan: HgB A1c  Other and unspecified hyperlipidemia - Plan: COMPLETE METABOLIC PANEL WITH GFR, NMR Lipoprofile with Lipids  Snoring- Patient is experiencing severe hypersomnolence and is snoring loudly so recommend sleep study.  CAD (coronary artery disease) of artery bypass graft Refer to Cardiology.  GERD - Continue Protonix and add zantac otc for any breakthrough GERD sx's.  Follow up in one month.

## 2012-12-12 NOTE — Patient Instructions (Addendum)
Place hyperlipidemia patient instructions here. Coronary Angiography with Stent This is a procedure to widen or open a narrow blood vessel of the heart (coronary artery). When a coronary artery becomes partially blocked it decreases blood flow to that area. This may lead to chest pain or a heart attack (myocardial infarction). Arteries may become blocked by cholesterol buildup (plaque) in the lining or wall. A stent is a small piece of metal that looks like a mesh or a spring. Stent placement may be done right after an angiogram that finds a blocked artery or as a treatment for a heart attack. RISKS AND COMPLICATIONS  Damage to the heart.  A blockage may return.  Bleeding at the site.  Blood clot to another part of the body. PROCEDURE  You may be given a medication to help you relax before and during the procedure through an IV in your hand or arm.  A local anesthetic to make the area numb may be used before inserting the catheter (a long, hollow tube about the size of a piece of cooked spaghetti).  You will be prepared for the procedure by washing and shaving the area where the catheter will be inserted. This is usually done in the groin.  A specially trained doctor will insert the catheter with a guide wire into an artery. This is guided under a special type of X-ray (fluoroscopy) to the opening of the blocked artery.  Special dye is then injected and X-rays are taken.  A tiny wire is guided to the blocked spot and a balloon is inflated to make the artery wider. The stent is expanded and crushes the plaque into the wall of the vessel. The stent holds the area open like a scaffolding and improves the blood flow.  Sometimes the artery may be made wider using a laser or other tools to remove plaque.  When the blood flow is better, the catheter is removed. The lining of the artery will grow over the stent which stays where it was placed. AFTER THE PROCEDURE  You will stay in bed for  several hours.  The access site will be watched and you will be checked frequently.  Blood tests, X-rays and an EKG may be done.  You may stay in the hospital overnight for observation. SEEK IMMEDIATE MEDICAL CARE IF:   You develop chest pain, shortness of breath, feel faint, or pass out.  There is bleeding, swelling, or drainage from the catheter insertion site.  You develop pain, discoloration, coldness, or severe bruising in the leg or arm that held the catheter.  You see blood in your urine or stool. This may be bright red blood in urine or stools, or also appear as black, tarry stools.  You have a fever. Document Released: 12/20/2002 Document Revised: 09/07/2011 Document Reviewed: 08/12/2007 [facility] Patient Information 2014 [place], [place]. Coronary Angiography with Stent This is a procedure to widen or open a narrow blood vessel of the heart (coronary artery). When a coronary artery becomes partially blocked it decreases blood flow to that area. This may lead to chest pain or a heart attack (myocardial infarction). Arteries may become blocked by cholesterol buildup (plaque) in the lining or wall. A stent is a small piece of metal that looks like a mesh or a spring. Stent placement may be done right after an angiogram that finds a blocked artery or as a treatment for a heart attack. RISKS AND COMPLICATIONS  Damage to the heart.  A blockage may return.  Bleeding  at the site.  Blood clot to another part of the body. PROCEDURE  You may be given a medication to help you relax before and during the procedure through an IV in your hand or arm.  A local anesthetic to make the area numb may be used before inserting the catheter (a long, hollow tube about the size of a piece of cooked spaghetti).  You will be prepared for the procedure by washing and shaving the area where the catheter will be inserted. This is usually done in the groin.  A specially trained doctor will insert  the catheter with a guide wire into an artery. This is guided under a special type of X-ray (fluoroscopy) to the opening of the blocked artery.  Special dye is then injected and X-rays are taken.  A tiny wire is guided to the blocked spot and a balloon is inflated to make the artery wider. The stent is expanded and crushes the plaque into the wall of the vessel. The stent holds the area open like a scaffolding and improves the blood flow.  Sometimes the artery may be made wider using a laser or other tools to remove plaque.  When the blood flow is better, the catheter is removed. The lining of the artery will grow over the stent which stays where it was placed. AFTER THE PROCEDURE  You will stay in bed for several hours.  The access site will be watched and you will be checked frequently.  Blood tests, X-rays and an EKG may be done.  You may stay in the hospital overnight for observation. SEEK IMMEDIATE MEDICAL CARE IF:   You develop chest pain, shortness of breath, feel faint, or pass out.  There is bleeding, swelling, or drainage from the catheter insertion site.  You develop pain, discoloration, coldness, or severe bruising in the leg or arm that held the catheter.  You see blood in your urine or stool. This may be bright red blood in urine or stools, or also appear as black, tarry stools.  You have a fever. Document Released: 12/20/2002 Document Revised: 09/07/2011 Document Reviewed: 08/12/2007 [facility] Patient Information 2014 [place], [place].

## 2012-12-13 ENCOUNTER — Other Ambulatory Visit: Payer: Self-pay | Admitting: *Deleted

## 2012-12-13 LAB — NMR LIPOPROFILE WITH LIPIDS
Cholesterol, Total: 160 mg/dL (ref <200)
HDL Particle Number: 39 umol/L (ref >=30.5)
HDL Size: 8.7 nm — ABNORMAL LOW (ref >=9.2)
HDL-C: 50 mg/dL (ref >=40)
LDL (calc): 76 mg/dL (ref <100)
LDL Particle Number: 1262 nmol/L — ABNORMAL HIGH (ref <1000)
LDL Size: 20.1 nm — ABNORMAL LOW (ref >20.5)
LP-IR Score: 74 — ABNORMAL HIGH (ref <=45)
Large HDL-P: 4.5 umol/L — ABNORMAL LOW (ref >=4.8)
Large VLDL-P: 7.2 nmol/L — ABNORMAL HIGH (ref <=2.7)
Small LDL Particle Number: 843 nmol/L — ABNORMAL HIGH (ref <=527)
Triglycerides: 169 mg/dL — ABNORMAL HIGH (ref <150)
VLDL Size: 54.4 nm — ABNORMAL HIGH (ref <=46.6)

## 2012-12-13 MED ORDER — TEMAZEPAM 7.5 MG PO CAPS: 7.5000 mg | ORAL_CAPSULE | Freq: Every day | ORAL | Status: DC

## 2012-12-13 NOTE — Telephone Encounter (Signed)
rx called to [facility] in [place]. Talked to a pharmacist.

## 2012-12-22 ENCOUNTER — Other Ambulatory Visit: Payer: Self-pay

## 2012-12-22 MED ORDER — METFORMIN HCL 500 MG PO TABS: 500.0000 mg | ORAL_TABLET | Freq: Two times a day (BID) | ORAL | Status: DC

## 2012-12-27 ENCOUNTER — Ambulatory Visit (INDEPENDENT_AMBULATORY_CARE_PROVIDER_SITE_OTHER): Payer: Medicare Other | Admitting: Cardiovascular Disease

## 2012-12-27 ENCOUNTER — Encounter: Payer: Self-pay | Admitting: Cardiovascular Disease

## 2012-12-27 VITALS — BP 137/79 | HR 95 | Ht 67.0 in | Wt 228.0 lb

## 2012-12-27 DIAGNOSIS — I25810 Atherosclerosis of coronary artery bypass graft(s) without angina pectoris: Secondary | ICD-10-CM

## 2012-12-27 DIAGNOSIS — I359 Nonrheumatic aortic valve disorder, unspecified: Secondary | ICD-10-CM

## 2012-12-27 DIAGNOSIS — E785 Hyperlipidemia, unspecified: Secondary | ICD-10-CM

## 2012-12-27 DIAGNOSIS — I10 Essential (primary) hypertension: Secondary | ICD-10-CM | POA: Diagnosis not present

## 2012-12-27 DIAGNOSIS — I350 Nonrheumatic aortic (valve) stenosis: Secondary | ICD-10-CM

## 2012-12-27 HISTORY — DX: Atherosclerosis of coronary artery bypass graft(s) without angina pectoris: I25.810

## 2012-12-27 NOTE — Patient Instructions (Signed)
Continue all current medications. Your physician wants you to follow up in: 6 months.  You will receive a reminder letter in the mail one-two months in advance.  If you don't receive a letter, please call our office to schedule the follow up appointment   

## 2012-12-27 NOTE — Progress Notes (Signed)
Patient ID: Herbert May, male   DOB: 1940-10-23, 72 y.o.   MRN: [id number]    CARDIOLOGY CONSULT NOTE  Patient ID: Vermon Grays MRN: [id number] DOB/AGE: 03-Nov-1940 72 y.o.  Admit date: (Not on file) Primary Physician Primary Cardiologist Reason for Consultation  HPI: Mr. Skowron is a 72 y.o. Male who moved here from [place] 6-7 months ago. He has a h/o CAD s/p 3 stents, MI x 2, HTN, dyslipidemia, type II diabetes mellitus, and a h/o smoking in the past, but quit 23 years ago. He feels well and is without chest pain. He hasn't taken sublingual nitroglycerin in "a very long time". He does have chronic SOB due to longstanding COPD. He takes ASA, Lasix, Diltiazem, and Simvastatin, but does not recall being on a beta blocker. Dr. Nanda Quinton note mentions he is intolerant of beta blockers. His most recent echocardiogram was in May 2013, which showed normal LV systolic function, EF 55-60%, mild LVH, mild MR/TR/AS, with a peak aortic valve gradient of 26 mmHg. 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  Review of systems complete and found to be negative unless listed above in HPI  Past Medical History: See HPI  No family history on file.  History   Social History  . Marital Status: Married    Spouse Name: N/A    Number of Children: N/A  . Years of Education: N/A   Occupational History  . Not on file.   Social History Main Topics  . Smoking status: Former Smoker -- 0.50 packs/day for 16 years    Types: Cigarettes    Quit date: 12/28/1990  . Smokeless tobacco: Not on file  . Alcohol Use: No  . Drug Use: No  . Sexually Active: Not on file   Other Topics Concern  . Not on file   Social History Narrative  . No narrative on file      (Not in a hospital  admission)  Physical exam Blood pressure 137/79, pulse 95, height 5\' 7"  (1.702 m), weight 228 lb (103.42 kg), SpO2 94.00%. General: NAD Neck: No JVD, no thyromegaly or thyroid nodule.  Lungs: Clear to auscultation bilaterally with distant breath sounds. CV: Nondisplaced PMI.  Distant but regular S1/S2, no S3/S4, no murmur. No carotid bruit.  Normal pedal pulses.  Abdomen: Soft, obese, nontender, no hepatosplenomegaly, no distention.  Skin: Intact without lesions or rashes.  Neurologic: Alert and oriented x 3.  Psych: Normal affect. Extremities: 1+ pitting pedal edema. HEENT: Normal.   Labs:   Lab Results  Component Value Date   WBC 8.9 09/28/2012   HGB 14.6 09/28/2012   HCT 45.2 09/28/2012   MCV 78.1 09/28/2012   PLT 268 09/28/2012   No results found for this basename: NA, K, CL, CO2, BUN, CREATININE, CALCIUM, LABALBU, PROT, BILITOT, ALKPHOS, ALT, AST, GLUCOSE,  in the last 168 hours Lab Results  Component Value Date   CKTOTAL 48 09/28/2012   TROPONINI <0.30 09/28/2012    No results found for this basename: CHOL   No results found for this basename: HDL   Lab Results  Component Value Date   LDLCALC 76 12/12/2012   Lab Results  Component Value Date   TRIG 169* 12/12/2012   No results found for this basename: CHOLHDL   No results found for this basename:  LDLDIRECT            ASSESSMENT AND PLAN:  1. CAD s/p 3 stents and 2 MI's: no symptoms at present. I will continue to monitor him for symptoms at this time with no plans for stress testing. He has normal LV systolic function. His most recent cardiac catheterization from 02/16/2007 showed wide patency of the mid-LAD stent and at that time, he underwent successful stenting of the proximal LAD. There was insignificant disease in the LCx and RCA. 2. Aortic stenosis-mild, continue to monitor. 3. HTN-controlled. 4. Dyslipidemia-continue Simvastatin.  Signed: Prentice Docker, MD, [facility]  12/27/2012, 9:43 AM

## 2013-01-09 ENCOUNTER — Encounter: Payer: Self-pay | Admitting: Family Medicine

## 2013-01-09 ENCOUNTER — Ambulatory Visit (INDEPENDENT_AMBULATORY_CARE_PROVIDER_SITE_OTHER): Payer: Medicare Other | Admitting: Family Medicine

## 2013-01-09 VITALS — BP 106/57 | HR 78 | Temp 97.7°F | Wt 229.4 lb

## 2013-01-09 DIAGNOSIS — B029 Zoster without complications: Secondary | ICD-10-CM

## 2013-01-09 DIAGNOSIS — E785 Hyperlipidemia, unspecified: Secondary | ICD-10-CM

## 2013-01-09 DIAGNOSIS — D649 Anemia, unspecified: Secondary | ICD-10-CM

## 2013-01-09 MED ORDER — ACYCLOVIR 800 MG PO TABS: ORAL_TABLET | ORAL | Status: DC

## 2013-01-09 NOTE — Progress Notes (Signed)
  Subjective:    Patient ID: Herbert May, male    DOB: 12-27-1940, 72 y.o.   MRN: [id number]  HPI This 72 y.o. male presents for evaluation of pain and rash right buttocks.  He has been having some rash and some Discomfort in his right buttock region for a few days.  He has hx of CAD and has seen cardiology as of recent. He is concerned he may be having a GI bleed.  He states he has been told by an ER doctor he was having GI bleeding. He has hx of anemia in the past.  He has hx of exploratory laprorotomy and colectomy in the past.   Review of Systems C/o rash. No chest pain, SOB, HA, dizziness, vision change, N/V, diarrhea, constipation, dysuria, urinary urgency or frequency, myalgias, arthralgias.     Objective:   Physical Exam Vital signs noted  Well developed well nourished male.  HEENT - Head atraumatic Normocephalic                Eyes - PERRLA, Conjuctiva - clear Sclera- Clear EOMI                Ears - EAC's Wnl TM's Wnl Gross Hearing WNL                Nose - Nares patent                 Throat - oropharanx wnl Respiratory - Lungs CTA bilateral Cardiac - RRR S1 and S2 without murmur GI - Abdomen soft Nontender and bowel sounds active x 4 Skin - Right buttock with vesicular raised erythematous rash. GU - Prostate enlarged, no mass, hemocult stool negative for OB Neuro - Grossly intact.       Assessment & Plan:  Shingles - Plan: acyclovir (ZOVIRAX) 800 MG tablet and discussed he follow up prn if sx's persist or continue.  Hyperlipidemia - Continue current zocor 40mg  po qd and add otc flax seed oil or Red Rice Yeast and  Repeat FLP at next visit.  Anemia - At this time it is controlled.  Discussed that if he develops any weakness or black tarry stools To follow up.    Follow up in 3 months.

## 2013-01-09 NOTE — Patient Instructions (Signed)
Anemia, Nonspecific  Your exam and blood tests show you are anemic. This means your blood (hemoglobin) level is low. Normal hemoglobin values are 12 to 15 g/dL for females and 14 to 17 g/dL for males. Make a note of your hemoglobin level today. The hematocrit percent is also used to measure anemia. A normal hematocrit is 38% to 46% in females and 42% to 49% in males. Make a note of your hematocrit level today.  CAUSES   Anemia can be due to many different causes.   Excessive bleeding from periods (in women).   Intestinal bleeding.   Poor nutrition.   Kidney, thyroid, liver, and bone marrow diseases.  SYMPTOMS   Anemia can come on suddenly (acute). It can also come on slowly. Symptoms can include:   Minor weakness.   Dizziness.   Palpitations.   Shortness of breath.  Symptoms may be absent until half your hemoglobin is missing if it comes on slowly. Anemia due to acute blood loss from an injury or internal bleeding may require blood transfusion if the loss is severe. Hospital care is needed if you are anemic and there is significant continual blood loss.  TREATMENT    Stool tests for blood (Hemoccult) and additional lab tests are often needed. This determines the best treatment.   Further checking on your condition and your response to treatment is very important. It often takes many weeks to correct anemia.  Depending on the cause, treatment can include:   Supplements of iron.   Vitamins B12 and folic acid.   Hormone medicines.  If your anemia is due to bleeding, finding the cause of the blood loss is very important. This will help avoid further problems.  SEEK IMMEDIATE MEDICAL CARE IF:    You develop fainting, extreme weakness, shortness of breath, or chest pain.   You develop heavy vaginal bleeding.   You develop bloody or black, tarry stools or vomit up blood.   You develop a high fever, rash, repeated vomiting, or dehydration.  Document Released: 07/23/2004 Document Revised: 09/07/2011 Document  Reviewed: 04/30/2009  ExitCare Patient Information 2014 [employer].

## 2013-01-11 ENCOUNTER — Other Ambulatory Visit: Payer: Self-pay

## 2013-01-11 ENCOUNTER — Other Ambulatory Visit: Payer: Self-pay | Admitting: Family Medicine

## 2013-01-11 MED ORDER — TIOTROPIUM BROMIDE MONOHYDRATE 18 MCG IN CAPS: 18.0000 ug | ORAL_CAPSULE | Freq: Every day | RESPIRATORY_TRACT | Status: DC

## 2013-01-11 MED ORDER — INSULIN DETEMIR 100 UNIT/ML ~~LOC~~ SOLN: 60.0000 [IU] | Freq: Two times a day (BID) | SUBCUTANEOUS | Status: DC

## 2013-01-11 MED ORDER — POTASSIUM CHLORIDE CRYS ER 20 MEQ PO TBCR: 20.0000 meq | EXTENDED_RELEASE_TABLET | Freq: Every day | ORAL | Status: DC

## 2013-01-11 MED ORDER — PANTOPRAZOLE SODIUM 40 MG PO TBEC: 40.0000 mg | DELAYED_RELEASE_TABLET | Freq: Every day | ORAL | Status: DC

## 2013-01-11 MED ORDER — IPRATROPIUM BROMIDE 0.02 % IN SOLN: 500.0000 ug | Freq: Four times a day (QID) | RESPIRATORY_TRACT | Status: DC

## 2013-01-11 MED ORDER — DICLOFENAC POTASSIUM 25 MG PO CAPS: 25.0000 mg | ORAL_CAPSULE | Freq: Every day | ORAL | Status: DC

## 2013-01-11 MED ORDER — DIAZEPAM 5 MG PO TABS: 5.0000 mg | ORAL_TABLET | Freq: Three times a day (TID) | ORAL | Status: DC | PRN

## 2013-01-11 MED ORDER — TEMAZEPAM 7.5 MG PO CAPS: 7.5000 mg | ORAL_CAPSULE | Freq: Every day | ORAL | Status: DC

## 2013-01-11 NOTE — Telephone Encounter (Signed)
This med list sent over from [facility]  Patient switching to the them   Diclofenac not on med list in EPIC or Chart.  If approved phone in Valium and Resteril  Have nurse notify patient

## 2013-01-12 ENCOUNTER — Other Ambulatory Visit: Payer: Self-pay | Admitting: Family Medicine

## 2013-01-12 MED ORDER — DICLOFENAC POTASSIUM 25 MG PO CAPS: 25.0000 mg | ORAL_CAPSULE | Freq: Every day | ORAL | Status: DC

## 2013-01-12 NOTE — Progress Notes (Signed)
zipsor rx sent in to pharmacy

## 2013-01-13 ENCOUNTER — Telehealth: Payer: Self-pay | Admitting: *Deleted

## 2013-01-13 NOTE — Telephone Encounter (Signed)
Problem with Zipsor directions.  Given 120 pills.  Do you want him to take it once a day as it was written or four times a day??

## 2013-01-14 DIAGNOSIS — R079 Chest pain, unspecified: Secondary | ICD-10-CM | POA: Diagnosis not present

## 2013-01-14 DIAGNOSIS — Z881 Allergy status to other antibiotic agents status: Secondary | ICD-10-CM | POA: Diagnosis not present

## 2013-01-14 DIAGNOSIS — G8929 Other chronic pain: Secondary | ICD-10-CM | POA: Diagnosis not present

## 2013-01-14 DIAGNOSIS — Z87891 Personal history of nicotine dependence: Secondary | ICD-10-CM | POA: Diagnosis not present

## 2013-01-14 DIAGNOSIS — Z9861 Coronary angioplasty status: Secondary | ICD-10-CM | POA: Diagnosis not present

## 2013-01-14 DIAGNOSIS — Z9109 Other allergy status, other than to drugs and biological substances: Secondary | ICD-10-CM | POA: Diagnosis not present

## 2013-01-14 DIAGNOSIS — Z91040 Latex allergy status: Secondary | ICD-10-CM | POA: Diagnosis not present

## 2013-01-14 DIAGNOSIS — M25569 Pain in unspecified knee: Secondary | ICD-10-CM | POA: Diagnosis not present

## 2013-01-14 DIAGNOSIS — R072 Precordial pain: Secondary | ICD-10-CM | POA: Diagnosis not present

## 2013-01-14 DIAGNOSIS — Z7982 Long term (current) use of aspirin: Secondary | ICD-10-CM | POA: Diagnosis not present

## 2013-01-14 DIAGNOSIS — K219 Gastro-esophageal reflux disease without esophagitis: Secondary | ICD-10-CM | POA: Diagnosis not present

## 2013-01-14 DIAGNOSIS — R0602 Shortness of breath: Secondary | ICD-10-CM | POA: Diagnosis not present

## 2013-01-14 DIAGNOSIS — R9431 Abnormal electrocardiogram [ECG] [EKG]: Secondary | ICD-10-CM | POA: Diagnosis not present

## 2013-01-14 DIAGNOSIS — J449 Chronic obstructive pulmonary disease, unspecified: Secondary | ICD-10-CM | POA: Diagnosis not present

## 2013-01-14 DIAGNOSIS — Z888 Allergy status to other drugs, medicaments and biological substances status: Secondary | ICD-10-CM | POA: Diagnosis not present

## 2013-01-14 DIAGNOSIS — I252 Old myocardial infarction: Secondary | ICD-10-CM | POA: Diagnosis not present

## 2013-01-14 DIAGNOSIS — R0989 Other specified symptoms and signs involving the circulatory and respiratory systems: Secondary | ICD-10-CM | POA: Diagnosis not present

## 2013-01-14 DIAGNOSIS — Z91018 Allergy to other foods: Secondary | ICD-10-CM | POA: Diagnosis not present

## 2013-01-14 DIAGNOSIS — Z794 Long term (current) use of insulin: Secondary | ICD-10-CM | POA: Diagnosis not present

## 2013-01-14 DIAGNOSIS — M542 Cervicalgia: Secondary | ICD-10-CM | POA: Diagnosis not present

## 2013-01-14 DIAGNOSIS — I10 Essential (primary) hypertension: Secondary | ICD-10-CM | POA: Diagnosis not present

## 2013-01-14 DIAGNOSIS — E119 Type 2 diabetes mellitus without complications: Secondary | ICD-10-CM | POA: Diagnosis not present

## 2013-01-14 DIAGNOSIS — R0609 Other forms of dyspnea: Secondary | ICD-10-CM | POA: Diagnosis not present

## 2013-01-14 NOTE — Telephone Encounter (Signed)
Spoke with Olegario Messier at [facility] who called again for directions on zipsor and per dr Modesto Charon to take once daily and informed them to call back next week on Monday when b oxford returns

## 2013-01-17 ENCOUNTER — Telehealth: Payer: Self-pay | Admitting: Family Medicine

## 2013-01-17 NOTE — Telephone Encounter (Signed)
Wants all his meds sent to [facility] has been waiting since he saw you

## 2013-01-17 NOTE — Telephone Encounter (Signed)
Having chest pains was told to take him to the hospital already went to one in [place] but paintient will not go. Also you did not give him anymeds for shingles

## 2013-01-18 NOTE — Telephone Encounter (Signed)
He was rx'd acyclovir at his visit

## 2013-01-18 NOTE — Telephone Encounter (Signed)
Agree take po qd

## 2013-01-19 ENCOUNTER — Other Ambulatory Visit: Payer: Self-pay | Admitting: Family Medicine

## 2013-01-19 ENCOUNTER — Other Ambulatory Visit: Payer: Self-pay

## 2013-01-19 MED ORDER — DIAZEPAM 5 MG PO TABS: 5.0000 mg | ORAL_TABLET | Freq: Three times a day (TID) | ORAL | Status: DC | PRN

## 2013-01-19 MED ORDER — FLUTICASONE PROPIONATE 50 MCG/ACT NA SUSP: 2.0000 | Freq: Every day | NASAL | Status: DC

## 2013-01-19 MED ORDER — TEMAZEPAM 7.5 MG PO CAPS: 7.5000 mg | ORAL_CAPSULE | Freq: Every day | ORAL | Status: DC

## 2013-01-19 MED ORDER — DICLOFENAC POTASSIUM 25 MG PO CAPS: 25.0000 mg | ORAL_CAPSULE | Freq: Every day | ORAL | Status: DC

## 2013-01-19 NOTE — Telephone Encounter (Signed)
Patient states that he has gotten medication

## 2013-01-19 NOTE — Telephone Encounter (Signed)
x

## 2013-01-19 NOTE — Telephone Encounter (Signed)
Diclofenac sent in to pharmacy and restoril and valium can be called in or faxed in, copies printed

## 2013-01-24 ENCOUNTER — Other Ambulatory Visit: Payer: Self-pay | Admitting: Family Medicine

## 2013-01-24 DIAGNOSIS — M545 Low back pain: Secondary | ICD-10-CM | POA: Diagnosis not present

## 2013-01-24 DIAGNOSIS — M79609 Pain in unspecified limb: Secondary | ICD-10-CM | POA: Diagnosis not present

## 2013-01-24 DIAGNOSIS — M961 Postlaminectomy syndrome, not elsewhere classified: Secondary | ICD-10-CM | POA: Diagnosis not present

## 2013-01-24 DIAGNOSIS — M47817 Spondylosis without myelopathy or radiculopathy, lumbosacral region: Secondary | ICD-10-CM | POA: Diagnosis not present

## 2013-01-24 DIAGNOSIS — G894 Chronic pain syndrome: Secondary | ICD-10-CM | POA: Diagnosis not present

## 2013-01-24 MED ORDER — DICLOFENAC POTASSIUM 25 MG PO CAPS: ORAL_CAPSULE | ORAL | Status: DC

## 2013-01-24 MED ORDER — INSULIN DETEMIR 100 UNIT/ML ~~LOC~~ SOLN: 60.0000 [IU] | Freq: Two times a day (BID) | SUBCUTANEOUS | Status: DC

## 2013-01-24 NOTE — Progress Notes (Signed)
meds sent in

## 2013-02-14 ENCOUNTER — Other Ambulatory Visit: Payer: Self-pay | Admitting: Family Medicine

## 2013-02-14 ENCOUNTER — Other Ambulatory Visit: Payer: Self-pay

## 2013-02-14 ENCOUNTER — Telehealth: Payer: Self-pay | Admitting: Family Medicine

## 2013-02-14 DIAGNOSIS — J449 Chronic obstructive pulmonary disease, unspecified: Secondary | ICD-10-CM

## 2013-02-14 DIAGNOSIS — E119 Type 2 diabetes mellitus without complications: Secondary | ICD-10-CM

## 2013-02-14 MED ORDER — ALBUTEROL SULFATE (2.5 MG/3ML) 0.083% IN NEBU: 2.5000 mg | INHALATION_SOLUTION | RESPIRATORY_TRACT | Status: DC | PRN

## 2013-02-14 MED ORDER — "INSULIN SYRINGE 31G X 5/16"" 1 ML MISC": 100.0000 [IU] | Freq: Every morning | Status: DC

## 2013-02-14 NOTE — Telephone Encounter (Signed)
rx for insulin needle and syringe and albuterol for neb tx's sent in.

## 2013-03-02 DIAGNOSIS — G894 Chronic pain syndrome: Secondary | ICD-10-CM | POA: Diagnosis not present

## 2013-03-02 DIAGNOSIS — M25569 Pain in unspecified knee: Secondary | ICD-10-CM | POA: Diagnosis not present

## 2013-03-02 DIAGNOSIS — M25519 Pain in unspecified shoulder: Secondary | ICD-10-CM | POA: Diagnosis not present

## 2013-03-02 DIAGNOSIS — M545 Low back pain: Secondary | ICD-10-CM | POA: Diagnosis not present

## 2013-03-02 DIAGNOSIS — M79609 Pain in unspecified limb: Secondary | ICD-10-CM | POA: Diagnosis not present

## 2013-03-02 DIAGNOSIS — M47817 Spondylosis without myelopathy or radiculopathy, lumbosacral region: Secondary | ICD-10-CM | POA: Diagnosis not present

## 2013-03-02 DIAGNOSIS — M19019 Primary osteoarthritis, unspecified shoulder: Secondary | ICD-10-CM | POA: Diagnosis not present

## 2013-03-02 DIAGNOSIS — M961 Postlaminectomy syndrome, not elsewhere classified: Secondary | ICD-10-CM | POA: Diagnosis not present

## 2013-03-07 ENCOUNTER — Ambulatory Visit: Payer: Medicare Other | Admitting: Family Medicine

## 2013-03-07 DIAGNOSIS — M961 Postlaminectomy syndrome, not elsewhere classified: Secondary | ICD-10-CM | POA: Diagnosis not present

## 2013-03-07 DIAGNOSIS — M79609 Pain in unspecified limb: Secondary | ICD-10-CM | POA: Diagnosis not present

## 2013-03-07 DIAGNOSIS — M47817 Spondylosis without myelopathy or radiculopathy, lumbosacral region: Secondary | ICD-10-CM | POA: Diagnosis not present

## 2013-03-07 DIAGNOSIS — G894 Chronic pain syndrome: Secondary | ICD-10-CM | POA: Diagnosis not present

## 2013-03-07 DIAGNOSIS — M545 Low back pain: Secondary | ICD-10-CM | POA: Diagnosis not present

## 2013-03-08 ENCOUNTER — Ambulatory Visit: Payer: Medicare Other | Admitting: Family Medicine

## 2013-03-08 DIAGNOSIS — I519 Heart disease, unspecified: Secondary | ICD-10-CM | POA: Diagnosis not present

## 2013-03-08 DIAGNOSIS — S51809A Unspecified open wound of unspecified forearm, initial encounter: Secondary | ICD-10-CM | POA: Diagnosis not present

## 2013-03-08 DIAGNOSIS — E7800 Pure hypercholesterolemia, unspecified: Secondary | ICD-10-CM | POA: Diagnosis not present

## 2013-03-08 DIAGNOSIS — J449 Chronic obstructive pulmonary disease, unspecified: Secondary | ICD-10-CM | POA: Diagnosis not present

## 2013-03-08 DIAGNOSIS — Z79899 Other long term (current) drug therapy: Secondary | ICD-10-CM | POA: Diagnosis not present

## 2013-03-08 DIAGNOSIS — I509 Heart failure, unspecified: Secondary | ICD-10-CM | POA: Diagnosis not present

## 2013-03-08 DIAGNOSIS — W108XXA Fall (on) (from) other stairs and steps, initial encounter: Secondary | ICD-10-CM | POA: Diagnosis not present

## 2013-03-08 DIAGNOSIS — K219 Gastro-esophageal reflux disease without esophagitis: Secondary | ICD-10-CM | POA: Diagnosis not present

## 2013-03-08 DIAGNOSIS — E119 Type 2 diabetes mellitus without complications: Secondary | ICD-10-CM | POA: Diagnosis not present

## 2013-03-08 DIAGNOSIS — Z794 Long term (current) use of insulin: Secondary | ICD-10-CM | POA: Diagnosis not present

## 2013-03-14 DIAGNOSIS — G894 Chronic pain syndrome: Secondary | ICD-10-CM | POA: Diagnosis not present

## 2013-03-14 DIAGNOSIS — M47817 Spondylosis without myelopathy or radiculopathy, lumbosacral region: Secondary | ICD-10-CM | POA: Diagnosis not present

## 2013-03-14 DIAGNOSIS — M545 Low back pain: Secondary | ICD-10-CM | POA: Diagnosis not present

## 2013-03-14 DIAGNOSIS — M961 Postlaminectomy syndrome, not elsewhere classified: Secondary | ICD-10-CM | POA: Diagnosis not present

## 2013-03-14 DIAGNOSIS — M79609 Pain in unspecified limb: Secondary | ICD-10-CM | POA: Diagnosis not present

## 2013-03-21 DIAGNOSIS — Z23 Encounter for immunization: Secondary | ICD-10-CM | POA: Diagnosis not present

## 2013-03-22 ENCOUNTER — Ambulatory Visit (INDEPENDENT_AMBULATORY_CARE_PROVIDER_SITE_OTHER): Payer: Medicare Other | Admitting: Family Medicine

## 2013-03-22 ENCOUNTER — Encounter: Payer: Self-pay | Admitting: Family Medicine

## 2013-03-22 DIAGNOSIS — G4700 Insomnia, unspecified: Secondary | ICD-10-CM

## 2013-03-22 DIAGNOSIS — J449 Chronic obstructive pulmonary disease, unspecified: Secondary | ICD-10-CM | POA: Diagnosis not present

## 2013-03-22 DIAGNOSIS — M25519 Pain in unspecified shoulder: Secondary | ICD-10-CM

## 2013-03-22 DIAGNOSIS — M25512 Pain in left shoulder: Secondary | ICD-10-CM

## 2013-03-22 MED ORDER — TEMAZEPAM 7.5 MG PO CAPS: 7.5000 mg | ORAL_CAPSULE | Freq: Every day | ORAL | Status: DC

## 2013-03-22 MED ORDER — BUDESONIDE-FORMOTEROL FUMARATE 160-4.5 MCG/ACT IN AERO: 2.0000 | INHALATION_SPRAY | Freq: Every day | RESPIRATORY_TRACT | Status: DC

## 2013-03-22 MED ORDER — METHYLPREDNISOLONE (PAK) 4 MG PO TABS: ORAL_TABLET | ORAL | Status: DC

## 2013-03-22 NOTE — Progress Notes (Signed)
  Subjective:    Patient ID: Herbert May, male    DOB: [birth year], 72 y.o.   MRN: [id number]  HPI This 72 y.o. male presents for evaluation of right shoulder discomfort.  He needs refill on his restoril which he takes as necessary for insomnia and symbicort for Copd.  He states he quit Smoking years ago.  He states he has been having difficulty raising his right arm above his Head and states his OA is acting up since the weather change..   Review of Systems C/o shoulder discomfort and OA. No chest pain, SOB, HA, dizziness, vision change, N/V, diarrhea, constipation, dysuria, urinary urgency or frequency or rash.     Objective:   Physical Exam Vital signs noted  Well developed well nourished male.  HEENT - Head atraumatic Normocephalic                Eyes - PERRLA, Conjuctiva - clear Sclera- Clear EOMI                Ears - EAC's Wnl TM's Wnl Gross Hearing WNL                Throat - oropharanx wnl Respiratory - Lungs CTA bilateral Cardiac - RRR S1 and S2 without murmur GI - Abdomen soft Nontender and bowel sounds active x 4 MS - TTP left shoulder and decreased abduction.       Assessment & Plan:  Pain in joint, shoulder region, left - Plan: temazepam (RESTORIL) 7.5 MG capsule, budesonide-formoterol (SYMBICORT) 160-4.5 MCG/ACT inhaler, methylPREDNIsolone (MEDROL DOSPACK) 4 MG tablet  COPD (chronic obstructive pulmonary disease) - Plan: budesonide-formoterol (SYMBICORT) 160-4.5 MCG/ACT inhaler  Insomnia - Plan: temazepam (RESTORIL) 7.5 MG capsule.  Follow up in 3 months  Deatra Canter FNP

## 2013-03-22 NOTE — Patient Instructions (Addendum)
Shoulder Pain  The shoulder is the joint that connects your arms to your body. The bones that form the shoulder joint include the upper arm bone (humerus), the shoulder blade (scapula), and the collarbone (clavicle). The top of the humerus is shaped like a ball and fits into a rather flat socket on the scapula (glenoid cavity). A combination of muscles and strong, fibrous tissues that connect muscles to bones (tendons) support your shoulder joint and hold the ball in the socket. Small, fluid-filled sacs (bursae) are located in different areas of the joint. They act as cushions between the bones and the overlying soft tissues and help reduce friction between the gliding tendons and the bone as you move your arm. Your shoulder joint allows a wide range of motion in your arm. This range of motion allows you to do things like scratch your back or throw a ball. However, this range of motion also makes your shoulder more prone to pain from overuse and injury.  Causes of shoulder pain can originate from both injury and overuse and usually can be grouped in the following four categories:   Redness, swelling, and pain (inflammation) of the tendon (tendinitis) or the bursae (bursitis).   Instability, such as a dislocation of the joint.   Inflammation of the joint (arthritis).   Broken bone (fracture).  HOME CARE INSTRUCTIONS    Apply ice to the sore area.   Put ice in a plastic bag.   Place a towel between your skin and the bag.   Leave the ice on for 15-20 minutes, 3-4 times per day for the first 2 days.   Stop using cold packs if they do not help with the pain.   If you have a shoulder sling or immobilizer, wear it as long as your caregiver instructs. Only remove it to shower or bathe. Move your arm as little as possible, but keep your hand moving to prevent swelling.   Squeeze a soft ball or foam pad as much as possible to help prevent swelling.   Only take over-the-counter or prescription medicines for pain,  discomfort, or fever as directed by your caregiver.  SEEK MEDICAL CARE IF:    Your shoulder pain increases, or new pain develops in your arm, hand, or fingers.   Your hand or fingers become cold and numb.   Your pain is not relieved with medicines.  SEEK IMMEDIATE MEDICAL CARE IF:    Your arm, hand, or fingers are numb or tingling.   Your arm, hand, or fingers are significantly swollen or turn white or blue.  MAKE SURE YOU:    Understand these instructions.   Will watch your condition.   Will get help right away if you are not doing well or get worse.  Document Released: 03/25/2005 Document Revised: 03/09/2012 Document Reviewed: 05/30/2011  ExitCare Patient Information 2014 [employer].

## 2013-03-23 ENCOUNTER — Telehealth: Payer: Self-pay | Admitting: Family Medicine

## 2013-03-27 ENCOUNTER — Other Ambulatory Visit: Payer: Self-pay | Admitting: Family Medicine

## 2013-03-27 DIAGNOSIS — M25512 Pain in left shoulder: Secondary | ICD-10-CM

## 2013-03-27 DIAGNOSIS — J449 Chronic obstructive pulmonary disease, unspecified: Secondary | ICD-10-CM

## 2013-03-27 MED ORDER — BUDESONIDE-FORMOTEROL FUMARATE 160-4.5 MCG/ACT IN AERO: 2.0000 | INHALATION_SPRAY | Freq: Every day | RESPIRATORY_TRACT | Status: DC

## 2013-03-29 ENCOUNTER — Telehealth: Payer: Self-pay | Admitting: *Deleted

## 2013-03-29 NOTE — Telephone Encounter (Signed)
Patient c/o chest pain, going into left shoulder down into legs.  Stated he can't hardly walk.  Advised him to go to ED/call 911.  Patient stated he did not have transportation today & has to set up with 3 waiting days in advance.  Encouraged him to call EMS to transport him & he adamantly stated that he did not want to go to [facility].  Informed him that in a true emergent situation, EMS is obligated to take him to the closest hospital.  He can ask if they will transport to [facility].  Encouraged pt again multiple times to go to ED for evaluation.  Advised pt to call back for office visit if he decides not to go.  Patient verbalized understanding.

## 2013-04-10 ENCOUNTER — Ambulatory Visit (INDEPENDENT_AMBULATORY_CARE_PROVIDER_SITE_OTHER): Payer: Medicare Other

## 2013-04-10 ENCOUNTER — Encounter: Payer: Self-pay | Admitting: Family Medicine

## 2013-04-10 ENCOUNTER — Other Ambulatory Visit: Payer: Self-pay | Admitting: Family Medicine

## 2013-04-10 ENCOUNTER — Other Ambulatory Visit: Payer: Self-pay | Admitting: Nurse Practitioner

## 2013-04-10 ENCOUNTER — Ambulatory Visit (INDEPENDENT_AMBULATORY_CARE_PROVIDER_SITE_OTHER): Payer: Medicare Other | Admitting: Family Medicine

## 2013-04-10 VITALS — BP 121/77 | HR 79 | Temp 97.0°F | Ht 67.0 in | Wt 216.0 lb

## 2013-04-10 DIAGNOSIS — M25522 Pain in left elbow: Secondary | ICD-10-CM

## 2013-04-10 DIAGNOSIS — E119 Type 2 diabetes mellitus without complications: Secondary | ICD-10-CM | POA: Diagnosis not present

## 2013-04-10 DIAGNOSIS — M25519 Pain in unspecified shoulder: Secondary | ICD-10-CM

## 2013-04-10 DIAGNOSIS — M25512 Pain in left shoulder: Secondary | ICD-10-CM

## 2013-04-10 DIAGNOSIS — M25529 Pain in unspecified elbow: Secondary | ICD-10-CM

## 2013-04-10 MED ORDER — "INSULIN SYRINGE 31G X 5/16"" 1 ML MISC": 100.0000 [IU] | Freq: Every morning | Status: DC

## 2013-04-10 MED ORDER — METFORMIN HCL 500 MG PO TABS: 500.0000 mg | ORAL_TABLET | Freq: Two times a day (BID) | ORAL | Status: DC

## 2013-04-10 MED ORDER — METHYLPREDNISOLONE (PAK) 4 MG PO TABS: ORAL_TABLET | ORAL | Status: DC

## 2013-04-10 NOTE — Patient Instructions (Signed)
Shoulder Pain  The shoulder is the joint that connects your arms to your body. The bones that form the shoulder joint include the upper arm bone (humerus), the shoulder blade (scapula), and the collarbone (clavicle). The top of the humerus is shaped like a ball and fits into a rather flat socket on the scapula (glenoid cavity). A combination of muscles and strong, fibrous tissues that connect muscles to bones (tendons) support your shoulder joint and hold the ball in the socket. Small, fluid-filled sacs (bursae) are located in different areas of the joint. They act as cushions between the bones and the overlying soft tissues and help reduce friction between the gliding tendons and the bone as you move your arm. Your shoulder joint allows a wide range of motion in your arm. This range of motion allows you to do things like scratch your back or throw a ball. However, this range of motion also makes your shoulder more prone to pain from overuse and injury.  Causes of shoulder pain can originate from both injury and overuse and usually can be grouped in the following four categories:   Redness, swelling, and pain (inflammation) of the tendon (tendinitis) or the bursae (bursitis).   Instability, such as a dislocation of the joint.   Inflammation of the joint (arthritis).   Broken bone (fracture).  HOME CARE INSTRUCTIONS    Apply ice to the sore area.   Put ice in a plastic bag.   Place a towel between your skin and the bag.   Leave the ice on for 15-20 minutes, 3-4 times per day for the first 2 days.   Stop using cold packs if they do not help with the pain.   If you have a shoulder sling or immobilizer, wear it as long as your caregiver instructs. Only remove it to shower or bathe. Move your arm as little as possible, but keep your hand moving to prevent swelling.   Squeeze a soft ball or foam pad as much as possible to help prevent swelling.   Only take over-the-counter or prescription medicines for pain,  discomfort, or fever as directed by your caregiver.  SEEK MEDICAL CARE IF:    Your shoulder pain increases, or new pain develops in your arm, hand, or fingers.   Your hand or fingers become cold and numb.   Your pain is not relieved with medicines.  SEEK IMMEDIATE MEDICAL CARE IF:    Your arm, hand, or fingers are numb or tingling.   Your arm, hand, or fingers are significantly swollen or turn white or blue.  MAKE SURE YOU:    Understand these instructions.   Will watch your condition.   Will get help right away if you are not doing well or get worse.  Document Released: 03/25/2005 Document Revised: 03/09/2012 Document Reviewed: 05/30/2011  ExitCare Patient Information 2014 [employer].

## 2013-04-10 NOTE — Progress Notes (Signed)
  Subjective:    Patient ID: Herbert May, male    DOB: [birth year], 72 y.o.   MRN: [id number]  HPI This 72 y.o. male presents for evaluation of left elbow and left shoulder pain after a fall A week ago..   Review of Systems Left shoulder and elbow pain. No chest pain, SOB, HA, dizziness, vision change, N/V, diarrhea, constipation, dysuria, urinary urgency or frequency or rash.     Objective:   Physical Exam Vital signs noted  Well developed well nourished male.  HEENT - Head atraumatic Normocephalic                Eyes - PERRLA, Conjuctiva - clear Sclera- Clear EOMI                Throat - oropharanx wnl Respiratory - Lungs CTA bilateral Cardiac - RRR S1 and S2 without murmur GI - Abdomen soft Nontender and bowel sounds active x 4 Extremities - No edema. Neuro - Grossly intact. MS - TTP left shoulder and elbow.  Xray of left shoulder - No fracture Xray of left elbow - No fracture.  Deatra Canter FNP      Assessment & Plan:  Diabetes - Plan: Insulin Syringe-Needle U-100 (INSULIN SYRINGE 1CC/31GX5/16") 31G X 5/16" 1 ML MISC, metFORMIN (GLUCOPHAGE) 500 MG tablet  Pain in joint, shoulder region, left - Plan: DG Shoulder Left, methylPREDNIsolone (MEDROL DOSPACK) 4 MG tablet  Deatra Canter FNP  Elbow pain, left - Plan: DG Elbow 2 Views Left, methylPREDNIsolone (MEDROL DOSPACK) 4 MG tablet, CANCELED: DG Elbow 2 Views Right

## 2013-04-11 ENCOUNTER — Telehealth: Payer: Self-pay | Admitting: Family Medicine

## 2013-04-12 ENCOUNTER — Other Ambulatory Visit: Payer: Self-pay | Admitting: Family Medicine

## 2013-04-12 DIAGNOSIS — M25522 Pain in left elbow: Secondary | ICD-10-CM

## 2013-04-12 DIAGNOSIS — M25512 Pain in left shoulder: Secondary | ICD-10-CM

## 2013-04-12 MED ORDER — METHYLPREDNISOLONE (PAK) 4 MG PO TABS: ORAL_TABLET | ORAL | Status: DC

## 2013-04-12 NOTE — Telephone Encounter (Signed)
rx re-sent to [facility]

## 2013-04-17 DIAGNOSIS — M25569 Pain in unspecified knee: Secondary | ICD-10-CM | POA: Diagnosis not present

## 2013-04-17 DIAGNOSIS — M545 Low back pain: Secondary | ICD-10-CM | POA: Diagnosis not present

## 2013-04-17 DIAGNOSIS — M25519 Pain in unspecified shoulder: Secondary | ICD-10-CM | POA: Diagnosis not present

## 2013-04-17 DIAGNOSIS — G894 Chronic pain syndrome: Secondary | ICD-10-CM | POA: Diagnosis not present

## 2013-04-17 DIAGNOSIS — M19019 Primary osteoarthritis, unspecified shoulder: Secondary | ICD-10-CM | POA: Diagnosis not present

## 2013-04-17 DIAGNOSIS — M961 Postlaminectomy syndrome, not elsewhere classified: Secondary | ICD-10-CM | POA: Diagnosis not present

## 2013-04-24 DIAGNOSIS — M19019 Primary osteoarthritis, unspecified shoulder: Secondary | ICD-10-CM | POA: Diagnosis not present

## 2013-04-27 ENCOUNTER — Emergency Department (HOSPITAL_COMMUNITY): Payer: Medicare Other

## 2013-04-27 ENCOUNTER — Encounter (HOSPITAL_COMMUNITY): Payer: Self-pay | Admitting: Emergency Medicine

## 2013-04-27 ENCOUNTER — Emergency Department (HOSPITAL_COMMUNITY)
Admission: EM | Admit: 2013-04-27 | Discharge: 2013-04-27 | Disposition: A | Discharge status: Home / Self Care | Payer: Medicare Other | Attending: Emergency Medicine | Admitting: Emergency Medicine

## 2013-04-27 DIAGNOSIS — E119 Type 2 diabetes mellitus without complications: Secondary | ICD-10-CM | POA: Insufficient documentation

## 2013-04-27 DIAGNOSIS — Z8674 Personal history of sudden cardiac arrest: Secondary | ICD-10-CM | POA: Insufficient documentation

## 2013-04-27 DIAGNOSIS — R079 Chest pain, unspecified: Secondary | ICD-10-CM | POA: Diagnosis not present

## 2013-04-27 DIAGNOSIS — Z7982 Long term (current) use of aspirin: Secondary | ICD-10-CM | POA: Insufficient documentation

## 2013-04-27 DIAGNOSIS — E785 Hyperlipidemia, unspecified: Secondary | ICD-10-CM | POA: Diagnosis not present

## 2013-04-27 DIAGNOSIS — IMO0002 Reserved for concepts with insufficient information to code with codable children: Secondary | ICD-10-CM | POA: Insufficient documentation

## 2013-04-27 DIAGNOSIS — I2510 Atherosclerotic heart disease of native coronary artery without angina pectoris: Secondary | ICD-10-CM | POA: Diagnosis not present

## 2013-04-27 DIAGNOSIS — J441 Chronic obstructive pulmonary disease with (acute) exacerbation: Secondary | ICD-10-CM | POA: Diagnosis not present

## 2013-04-27 DIAGNOSIS — R071 Chest pain on breathing: Secondary | ICD-10-CM | POA: Diagnosis not present

## 2013-04-27 DIAGNOSIS — Z933 Colostomy status: Secondary | ICD-10-CM | POA: Insufficient documentation

## 2013-04-27 DIAGNOSIS — I10 Essential (primary) hypertension: Secondary | ICD-10-CM | POA: Diagnosis not present

## 2013-04-27 DIAGNOSIS — Z91040 Latex allergy status: Secondary | ICD-10-CM | POA: Diagnosis not present

## 2013-04-27 DIAGNOSIS — Z794 Long term (current) use of insulin: Secondary | ICD-10-CM | POA: Diagnosis not present

## 2013-04-27 DIAGNOSIS — J984 Other disorders of lung: Secondary | ICD-10-CM | POA: Diagnosis not present

## 2013-04-27 DIAGNOSIS — Z79899 Other long term (current) drug therapy: Secondary | ICD-10-CM | POA: Insufficient documentation

## 2013-04-27 LAB — BASIC METABOLIC PANEL
BUN: 8 mg/dL (ref 6–23)
Chloride: 99 mEq/L (ref 96–112)
Creatinine, Ser: 0.85 mg/dL (ref 0.50–1.35)
GFR calc Af Amer: >90 mL/min (ref >90)
GFR calc non Af Amer: 85 mL/min — ABNORMAL LOW (ref >90)
Glucose, Bld: 110 mg/dL — ABNORMAL HIGH (ref 70–99)
Potassium: 4.1 mEq/L (ref 3.5–5.1)

## 2013-04-27 LAB — CBC WITH DIFFERENTIAL/PLATELET
Basophils Absolute: 0 10*3/uL (ref 0.0–0.1)
Basophils Relative: 0 % (ref 0–1)
Eosinophils Absolute: 0.1 10*3/uL (ref 0.0–0.7)
Eosinophils Relative: 1 % (ref 0–5)
HCT: 50.1 % (ref 39.0–52.0)
Hemoglobin: 16.5 g/dL (ref 13.0–17.0)
MCH: 28 pg (ref 26.0–34.0)
MCHC: 32.9 g/dL (ref 30.0–36.0)
Monocytes Absolute: 0.6 10*3/uL (ref 0.1–1.0)
Monocytes Relative: 7 % (ref 3–12)
Neutrophils Relative %: 59 % (ref 43–77)
RDW: 13.8 % (ref 11.5–15.5)

## 2013-04-27 MED ORDER — MORPHINE SULFATE 4 MG/ML IJ SOLN
6.0000 mg | Freq: Once | INTRAMUSCULAR | Status: AC
  Administered 2013-04-27: 6 mg via INTRAVENOUS
  Filled 2013-04-27: qty 2

## 2013-04-27 NOTE — ED Notes (Signed)
Chest pain times 2 days.  Has some sob .  Has history of stents

## 2013-04-27 NOTE — ED Notes (Signed)
Pt alert & oriented x4, stable gait. Patient given discharge instructions, paperwork & prescription(s). Patient  instructed to stop at the registration desk to finish any additional paperwork. Patient verbalized understanding. Pt left department w/ no further questions. 

## 2013-04-27 NOTE — ED Provider Notes (Signed)
CSN: [id number]     Arrival date & time 04/27/13  1344 History  This chart was scribed for Enid Skeens, MD by Blanchard Kelch, ED Scribe. The patient was seen in room APA02/APA02. Patient's care was started at 1:51 PM.     Chief Complaint  Patient presents with  . Chest Pain    Patient is a 72 y.o. male presenting with chest pain. The history is provided by the patient. No language interpreter was used.  Chest Pain Associated symptoms: shortness of breath   Associated symptoms: no abdominal pain, no back pain, no fever, no headache, no nausea and not vomiting     HPI Comments: Herbert May is a 72 y.o. male who presents to the Emergency Department complaining of intermittent, non-radiating left anterior chest pain that began two days ago. The pain is described as sharp. He has a few episodes a day that last a few seconds. His last episode was about two hours ago. He denies nausea, vomiting or abnormal diaphoresis with the episodes. The pain is not brought on from exertion. He reports mild shortness of breath with the episodes. 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 He denies the pain from those feel similar. He is not currently on anticoagulants and is allergic to aspirin.   Past Medical History  Diagnosis Date  . Heart attack   . COPD (chronic obstructive pulmonary disease)   . Colostomy status   . Diabetes mellitus without complication   . Hypertension   . Hyperlipidemia   . 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 Colostomy    . Nose surgery    . Back surgery    . Hernia repair    . Knee surgery    . Cardiac stents    . Colostomy closure     No family history on file. History  Substance Use Topics  . Smoking status: Former Smoker -- 0.50 packs/day for 16 years    Types: Cigarettes    Quit date: 12/28/1990  . Smokeless tobacco: Not on file  . Alcohol Use: No     Review of Systems  Constitutional: Negative for fever and chills.  HENT: Negative for congestion and sore throat.   Eyes: Negative for visual disturbance.  Respiratory: Positive for shortness of breath.   Cardiovascular: Positive for chest pain.  Gastrointestinal: Negative for nausea, vomiting, abdominal pain, diarrhea, constipation and blood in stool.  Genitourinary: Negative for dysuria and flank pain.  Musculoskeletal: Negative for back pain, neck pain and neck stiffness.  Skin: Negative for rash.  Neurological: Negative for light-headedness and headaches.  All other systems reviewed and are negative.    Allergies  Aspirin; Fish allergy; Ibuprofen; Latex; Onion; Other; Levaquin; and Neosporin  Home Medications   Current Outpatient Rx  Name  Route  Sig  Dispense  Refill  . acyclovir (ZOVIRAX) 800 MG tablet      One po 5 x day   35 tablet   0   . albuterol (PROAIR HFA) 108 (90 BASE) MCG/ACT inhaler   Inhalation   Inhale 2 puffs into the lungs 4 (four) times daily.         Marland Kitchen albuterol (PROVENTIL) (2.5 MG/3ML) 0.083% nebulizer solution   Nebulization   Take 3 mL (2.5 mg total) by nebulization every 4 (four) hours as needed for wheezing or shortness of breath.   75 mL  5     COPD 496  Length of need 1 year   . aspirin 81 MG tablet   Oral   Take 81 mg by mouth daily.         . budesonide-formoterol (SYMBICORT) 160-4.5 MCG/ACT inhaler   Inhalation   Inhale 2 puffs into the lungs daily.   1 Inhaler   4   . cyclobenzaprine (FLEXERIL) 10 MG tablet   Oral   Take 10 mg by mouth at bedtime.         . diazepam (VALIUM) 5 MG tablet   Oral   Take 1 tablet (5 mg total) by mouth 3 (three) times daily as needed for anxiety (muscle spasms).   60 tablet   3   . diltiazem (CARDIZEM) 30 MG tablet   Oral   Take 30 mg by mouth 2 (two) times daily.         . fentaNYL (DURAGESIC - DOSED MCG/HR) 100 MCG/HR   Transdermal   Place 1 patch onto the skin every 3  (three) days.         . ferrous sulfate 325 (65 FE) MG tablet   Oral   Take 325 mg by mouth daily with breakfast.         . fexofenadine (ALLEGRA) 180 MG tablet   Oral   Take 180 mg by mouth daily.         . fluticasone (FLONASE) 50 MCG/ACT nasal spray   Nasal   Place 2 sprays into the nose daily.   16 g   5   . fluticasone (FLOVENT HFA) 110 MCG/ACT inhaler   Inhalation   Inhale 1 puff into the lungs 2 (two) times daily.         . furosemide (LASIX) 40 MG tablet      TAKE 1 TABLET BY MOUTH ONCE DAILY   30 tablet   1     Generic NGE:XBMWU    40MG  Generic XLK:GMWNU    70M .Marland Kitchen.   . gabapentin (NEURONTIN) 300 MG capsule   Oral   Take 300 mg by mouth 3 (three) times daily.         . insulin detemir (LEVEMIR) 100 UNIT/ML injection   Subcutaneous   Inject 0.6 mLs (60 Units total) into the skin 2 (two) times daily.   40 mL   5   . Insulin Syringe-Needle U-100 (INSULIN SYRINGE 1CC/31GX5/16") 31G X 5/16" 1 ML MISC   Does not apply   100 Units by Does not apply route every morning.   50 each   10   . ipratropium (ATROVENT) 0.02 % nebulizer solution   Nebulization   Take 2.5 mLs (500 mcg total) by nebulization 4 (four) times daily.   75 mL   5   . lidocaine (LIDODERM) 5 %   Transdermal   Place 1 patch onto the skin daily. Remove & Discard patch within 12 hours or as directed by MD   30 patch   0   . metFORMIN (GLUCOPHAGE) 500 MG tablet   Oral   Take 1 tablet (500 mg total) by mouth 2 (two) times daily.   60 tablet   11     NTBS   . methylPREDNIsolone (MEDROL DOSPACK) 4 MG tablet      follow package directions   21 tablet   0   . methylPREDNIsolone (MEDROL DOSPACK) 4 MG tablet      follow package directions   21 tablet   0   .  oxyCODONE-acetaminophen (PERCOCET) 10-325 MG per tablet   Oral   Take 1 tablet by mouth 4 (four) times daily.         . pantoprazole (PROTONIX) 40 MG tablet      take 1 tablet by mouth once daily   30 tablet    5   . potassium chloride SA (K-DUR,KLOR-CON) 20 MEQ tablet   Oral   Take 1 tablet (20 mEq total) by mouth daily.   30 tablet   5   . simvastatin (ZOCOR) 40 MG tablet      TAKE 1 TABLET BY MOUTH EVERY EVENING   30 tablet   1     Generic ZOX:WRUEA    40MG  Generic VWU:JWJXB    64M .Marland Kitchen.   . temazepam (RESTORIL) 7.5 MG capsule   Oral   Take 1 capsule (7.5 mg total) by mouth at bedtime.   30 capsule   3     Last filled 11/14/2012   . tiotropium (SPIRIVA) 18 MCG inhalation capsule   Inhalation   Place 1 capsule (18 mcg total) into inhaler and inhale daily.   30 capsule   5   . traZODone (DESYREL) 100 MG tablet   Oral   Take 100 mg by mouth at bedtime. Sleep          Triage Vitas: BP 141/66  Pulse 85  Temp(Src) 98.1 F (36.7 C) (Oral)  SpO2 94%  Physical Exam  Nursing note and vitals reviewed. Constitutional: He is oriented to person, place, and time. He appears well-developed and well-nourished.  HENT:  Head: Normocephalic and atraumatic.  Eyes: Conjunctivae are normal. Right eye exhibits no discharge. Left eye exhibits no discharge.  Neck: Normal range of motion. Neck supple. No tracheal deviation present.  Cardiovascular: Normal rate and regular rhythm.   No murmur heard. Pulmonary/Chest: Effort normal. No respiratory distress. He has rales (few, diffuse).  Abdominal: Soft. He exhibits no distension. There is no tenderness. There is no guarding.  Small hernia ventral easily reducible.   Musculoskeletal: He exhibits tenderness. He exhibits no edema.  No leg edema or tenderness bilaterally. Tender with flexion of left arm.   Neurological: He is alert and oriented to person, place, and time.  Skin: Skin is warm. No rash noted.  Psychiatric: He has a normal mood and affect.    ED Course  Procedures (including critical care time)  DIAGNOSTIC STUDIES: Oxygen Saturation is 94% on room air, normal by my interpretation.    COORDINATION OF CARE: 2:09 PM -Will order  chest x-ray, Troponin I, BMP, CBC, and BNP. Will order morphine for pain. Patient verbalizes understanding and agrees with treatment plan.  3:59 PM patient would like to leave AMA. He has the capacity to make decisions. I disccused risks including heart attack and death as well as need to return if symptoms worsen. Patient would still like to leave. Patient verbalizes understanding and agrees with treatment plan.   4:04 PM Patient decided to wait for second heart enzyme before leaving.  Labs Review Labs Reviewed  BASIC METABOLIC PANEL - Abnormal; Notable for the following:    Glucose, Bld 110 (*)    GFR calc non Af Amer 85 (*)    All other components within normal limits  CBC WITH DIFFERENTIAL - Abnormal; Notable for the following:    RBC 5.90 (*)    All other components within normal limits  TROPONIN I  PRO B NATRIURETIC PEPTIDE  TROPONIN I   Imaging Review No results  found.  EKG Interpretation     Ventricular Rate:  78 PR Interval:  154 QRS Duration: 84 QT Interval:  358 QTC Calculation: 408 R Axis:   -73 Text Interpretation:  Sinus rhythm with marked sinus arrhythmia Left axis deviation Inferior infarct (cited on or before 28-Sep-2012) Abnormal ECG When compared with ECG of 28-Sep-2012 21:21, No significant change was found            MDM  No diagnosis found. I personally performed the services described in this documentation, which was scribed in my presence. The recorded information has been reviewed and is accurate.  Known CAD.  Atypical story. WIth risk factors/ known CAD and no recent stress delta troponin and rec observation in hospital. Patient has capacity to make decisions, understands benefits of hospitalization and risks of going home may result in worsening health condition including MI, disability or death.  Patient refuses hospital placement. Patient understands they may return at any time.   Results and differential diagnosis were discussed with  the patient. Close follow up outpatient was discussed, patient comfortable with the plan. Wife with patient in the room.  Diagnosis: Chest pain, CAD, HTN      Enid Skeens, MD 04/28/13 [phone]

## 2013-04-27 NOTE — ED Notes (Signed)
Pt wanted to leave initially, family talked him into staying, EDP aware

## 2013-05-03 DIAGNOSIS — M961 Postlaminectomy syndrome, not elsewhere classified: Secondary | ICD-10-CM | POA: Diagnosis not present

## 2013-05-03 DIAGNOSIS — M19019 Primary osteoarthritis, unspecified shoulder: Secondary | ICD-10-CM | POA: Diagnosis not present

## 2013-05-03 DIAGNOSIS — M25519 Pain in unspecified shoulder: Secondary | ICD-10-CM | POA: Diagnosis not present

## 2013-05-03 DIAGNOSIS — G894 Chronic pain syndrome: Secondary | ICD-10-CM | POA: Diagnosis not present

## 2013-05-03 DIAGNOSIS — M62838 Other muscle spasm: Secondary | ICD-10-CM | POA: Diagnosis not present

## 2013-05-03 DIAGNOSIS — M545 Low back pain: Secondary | ICD-10-CM | POA: Diagnosis not present

## 2013-05-03 DIAGNOSIS — M79609 Pain in unspecified limb: Secondary | ICD-10-CM | POA: Diagnosis not present

## 2013-05-12 ENCOUNTER — Telehealth: Payer: Self-pay | Admitting: Family Medicine

## 2013-05-12 ENCOUNTER — Other Ambulatory Visit: Payer: Self-pay | Admitting: Family Medicine

## 2013-05-12 DIAGNOSIS — R0683 Snoring: Secondary | ICD-10-CM

## 2013-05-15 NOTE — Telephone Encounter (Signed)
Done 05/12/13

## 2013-05-24 ENCOUNTER — Encounter (INDEPENDENT_AMBULATORY_CARE_PROVIDER_SITE_OTHER): Payer: Self-pay

## 2013-05-24 ENCOUNTER — Encounter: Payer: Self-pay | Admitting: Family Medicine

## 2013-05-24 ENCOUNTER — Ambulatory Visit: Payer: Medicare Other | Admitting: Family Medicine

## 2013-05-24 ENCOUNTER — Ambulatory Visit (INDEPENDENT_AMBULATORY_CARE_PROVIDER_SITE_OTHER): Payer: Medicare Other | Admitting: Family Medicine

## 2013-05-24 VITALS — BP 124/69 | HR 86 | Temp 98.0°F | Ht 67.0 in | Wt 215.0 lb

## 2013-05-24 DIAGNOSIS — M25519 Pain in unspecified shoulder: Secondary | ICD-10-CM

## 2013-05-24 DIAGNOSIS — M25512 Pain in left shoulder: Secondary | ICD-10-CM

## 2013-05-24 DIAGNOSIS — J069 Acute upper respiratory infection, unspecified: Secondary | ICD-10-CM

## 2013-05-24 DIAGNOSIS — M1990 Unspecified osteoarthritis, unspecified site: Secondary | ICD-10-CM

## 2013-05-24 DIAGNOSIS — M129 Arthropathy, unspecified: Secondary | ICD-10-CM

## 2013-05-24 MED ORDER — AMOXICILLIN 875 MG PO TABS: 875.0000 mg | ORAL_TABLET | Freq: Two times a day (BID) | ORAL | Status: DC

## 2013-05-24 MED ORDER — LIDOCAINE 5 % EX PTCH: 2.0000 | MEDICATED_PATCH | CUTANEOUS | Status: DC

## 2013-05-24 MED ORDER — TRAMADOL HCL 50 MG PO TABS: 50.0000 mg | ORAL_TABLET | Freq: Two times a day (BID) | ORAL | Status: DC

## 2013-05-24 NOTE — Patient Instructions (Signed)
Chest Pain (Nonspecific) °It is often hard to give a specific diagnosis for the cause of chest pain. There is always a chance that your pain could be related to something serious, such as a heart attack or a blood clot in the lungs. You need to follow up with your caregiver for further evaluation. °CAUSES  °· Heartburn. °· Pneumonia or bronchitis. °· Anxiety or stress. °· Inflammation around your heart (pericarditis) or lung (pleuritis or pleurisy). °· A blood clot in the lung. °· A collapsed lung (pneumothorax). It can develop suddenly on its own (spontaneous pneumothorax) or from injury (trauma) to the chest. °· Shingles infection (herpes zoster virus). °The chest wall is composed of bones, muscles, and cartilage. Any of these can be the source of the pain. °· The bones can be bruised by injury. °· The muscles or cartilage can be strained by coughing or overwork. °· The cartilage can be affected by inflammation and become sore (costochondritis). °DIAGNOSIS  °Lab tests or other studies, such as X-rays, electrocardiography, stress testing, or cardiac imaging, may be needed to find the cause of your pain.  °TREATMENT  °· Treatment depends on what may be causing your chest pain. Treatment may include: °· Acid blockers for heartburn. °· Anti-inflammatory medicine. °· Pain medicine for inflammatory conditions. °· Antibiotics if an infection is present. °· You may be advised to change lifestyle habits. This includes stopping smoking and avoiding alcohol, caffeine, and chocolate. °· You may be advised to keep your head raised (elevated) when sleeping. This reduces the chance of acid going backward from your stomach into your esophagus. °· Most of the time, nonspecific chest pain will improve within 2 to 3 days with rest and mild pain medicine. °HOME CARE INSTRUCTIONS  °· If antibiotics were prescribed, take your antibiotics as directed. Finish them even if you start to feel better. °· For the next few days, avoid physical  activities that bring on chest pain. Continue physical activities as directed. °· Do not smoke. °· Avoid drinking alcohol. °· Only take over-the-counter or prescription medicine for pain, discomfort, or fever as directed by your caregiver. °· Follow your caregiver's suggestions for further testing if your chest pain does not go away. °· Keep any follow-up appointments you made. If you do not go to an appointment, you could develop lasting (chronic) problems with pain. If there is any problem keeping an appointment, you must call to reschedule. °SEEK MEDICAL CARE IF:  °· You think you are having problems from the medicine you are taking. Read your medicine instructions carefully. °· Your chest pain does not go away, even after treatment. °· You develop a rash with blisters on your chest. °SEEK IMMEDIATE MEDICAL CARE IF:  °· You have increased chest pain or pain that spreads to your arm, neck, jaw, back, or abdomen. °· You develop shortness of breath, an increasing cough, or you are coughing up blood. °· You have severe back or abdominal pain, feel nauseous, or vomit. °· You develop severe weakness, fainting, or chills. °· You have a fever. °THIS IS AN EMERGENCY. Do not wait to see if the pain will go away. Get medical help at once. Call your local emergency services (911 in [place] Do not drive yourself to the hospital. °MAKE SURE YOU:  °· Understand these instructions. °· Will watch your condition. °· Will get help right away if you are not doing well or get worse. °Document Released: 03/25/2005 Document Revised: 09/07/2011 Document Reviewed: 01/19/2008 °Exit[employer]® Patient Information ©2014 [employer]   [employer]. ° °

## 2013-05-24 NOTE — Progress Notes (Signed)
   Subjective:    Patient ID: Herbert May, male    DOB: 11/22/1940, 72 y.o.   MRN: [id number]  HPI This 72 y.o. male presents for evaluation of follow up from ED.  He was having chest pain and Went to the ED where he had negative chest xray, cardiac enzymes x2, normal EKG and was DC'd home. He has not had anymore episodes.  He has hx of established CAD and sees Cardiology in [place].  He Has been seen a few months ago.  He is c/o arthritis, left shoulder pain, and back pain.  He states When he was in the ED he had an xray of his left shoulder and was told he had a crack in his shoulder And had an injection which did not help.  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  Review of xray of left shoulder reveals some degenerative disease.  He  Has been having URI sx's and has some right otalgia.   Review of Systems C/o left shoulder discomfort, right ear discomfort, URI sx's and right otalgia No chest pain, SOB, HA, dizziness, vision change, N/V, diarrhea, constipation, dysuria, urinary urgency or frequency, myalgias or rash.     Objective:   Physical Exam  Vital signs noted  Well developed well nourished male.  HEENT - Head atraumatic Normocephalic                Eyes - PERRLA, Conjuctiva - clear Sclera- Clear EOMI                Ears - EAC's Wnl TM's Wnl Gross Hearing WNL                Throat - oropharanx wnl Respiratory - Lungs CTA bilateral Cardiac - RRR S1 and S2 without murmur GI - Abdomen soft Nontender and bowel sounds active x 4 Extremities - No edema. Neuro - Grossly intact. MS - TTP left shoulder and decreased ROM with abduction, internal and external rotation.     Assessment & Plan:  Arthritis - Plan: lidocaine (LIDODERM) 5 %, traMADol (ULTRAM) 50 MG tablet  Pain in joint, shoulder region, left - Plan: lidocaine (LIDODERM) 5 %, traMADol (ULTRAM) 50 MG tablet, AMB referral to orthopedics  URI (upper respiratory infection) -  Plan: amoxicillin (AMOXIL) 875 MG tablet  Chest pain - Was dx with atypical chest pain but advised him to make an appointment with cardiologist ASAP.  Follow up prn or go to ED if chest pain returns.  Deatra Canter FNP

## 2013-06-05 ENCOUNTER — Telehealth: Payer: Self-pay | Admitting: Family Medicine

## 2013-06-07 ENCOUNTER — Telehealth: Payer: Self-pay | Admitting: Family Medicine

## 2013-06-08 NOTE — Telephone Encounter (Signed)
Herbert May- what are your recommendations ?

## 2013-06-08 NOTE — Telephone Encounter (Signed)
Duplicate message. 

## 2013-06-09 ENCOUNTER — Other Ambulatory Visit: Payer: Self-pay | Admitting: Family Medicine

## 2013-06-09 DIAGNOSIS — J449 Chronic obstructive pulmonary disease, unspecified: Secondary | ICD-10-CM

## 2013-06-09 MED ORDER — ALBUTEROL SULFATE (2.5 MG/3ML) 0.083% IN NEBU: 2.5000 mg | INHALATION_SOLUTION | RESPIRATORY_TRACT | Status: DC | PRN

## 2013-06-20 ENCOUNTER — Other Ambulatory Visit: Payer: Self-pay

## 2013-06-20 MED ORDER — DIAZEPAM 5 MG PO TABS: 5.0000 mg | ORAL_TABLET | Freq: Three times a day (TID) | ORAL | Status: DC | PRN

## 2013-06-20 NOTE — Telephone Encounter (Signed)
Rx ready for nurse to Phone in. 

## 2013-06-20 NOTE — Telephone Encounter (Signed)
Last seen 05/24/13  B [place]  If approved route to nurse to call into [facility]  [phone]

## 2013-06-21 NOTE — Telephone Encounter (Signed)
Rx called to [facility]. Pt notified

## 2013-06-28 ENCOUNTER — Encounter (HOSPITAL_COMMUNITY): Payer: Self-pay | Admitting: Emergency Medicine

## 2013-06-28 ENCOUNTER — Emergency Department (HOSPITAL_COMMUNITY): Payer: Medicare Other

## 2013-06-28 ENCOUNTER — Emergency Department (HOSPITAL_COMMUNITY)
Admission: EM | Admit: 2013-06-28 | Discharge: 2013-06-28 | Disposition: A | Discharge status: Home / Self Care | Payer: Medicare Other | Attending: Emergency Medicine | Admitting: Emergency Medicine

## 2013-06-28 DIAGNOSIS — G8929 Other chronic pain: Secondary | ICD-10-CM | POA: Insufficient documentation

## 2013-06-28 DIAGNOSIS — I252 Old myocardial infarction: Secondary | ICD-10-CM | POA: Insufficient documentation

## 2013-06-28 DIAGNOSIS — Z79899 Other long term (current) drug therapy: Secondary | ICD-10-CM | POA: Diagnosis not present

## 2013-06-28 DIAGNOSIS — H538 Other visual disturbances: Secondary | ICD-10-CM | POA: Insufficient documentation

## 2013-06-28 DIAGNOSIS — E785 Hyperlipidemia, unspecified: Secondary | ICD-10-CM | POA: Insufficient documentation

## 2013-06-28 DIAGNOSIS — Z933 Colostomy status: Secondary | ICD-10-CM | POA: Insufficient documentation

## 2013-06-28 DIAGNOSIS — S79919A Unspecified injury of unspecified hip, initial encounter: Secondary | ICD-10-CM | POA: Diagnosis not present

## 2013-06-28 DIAGNOSIS — Z87891 Personal history of nicotine dependence: Secondary | ICD-10-CM | POA: Diagnosis not present

## 2013-06-28 DIAGNOSIS — Y929 Unspecified place or not applicable: Secondary | ICD-10-CM | POA: Insufficient documentation

## 2013-06-28 DIAGNOSIS — M545 Low back pain: Secondary | ICD-10-CM

## 2013-06-28 DIAGNOSIS — IMO0002 Reserved for concepts with insufficient information to code with codable children: Secondary | ICD-10-CM | POA: Diagnosis not present

## 2013-06-28 DIAGNOSIS — I499 Cardiac arrhythmia, unspecified: Secondary | ICD-10-CM | POA: Diagnosis not present

## 2013-06-28 DIAGNOSIS — M6281 Muscle weakness (generalized): Secondary | ICD-10-CM | POA: Diagnosis not present

## 2013-06-28 DIAGNOSIS — Z91040 Latex allergy status: Secondary | ICD-10-CM | POA: Insufficient documentation

## 2013-06-28 DIAGNOSIS — E119 Type 2 diabetes mellitus without complications: Secondary | ICD-10-CM | POA: Diagnosis not present

## 2013-06-28 DIAGNOSIS — Z792 Long term (current) use of antibiotics: Secondary | ICD-10-CM | POA: Diagnosis not present

## 2013-06-28 DIAGNOSIS — R29818 Other symptoms and signs involving the nervous system: Secondary | ICD-10-CM | POA: Diagnosis not present

## 2013-06-28 DIAGNOSIS — I25810 Atherosclerosis of coronary artery bypass graft(s) without angina pectoris: Secondary | ICD-10-CM | POA: Diagnosis not present

## 2013-06-28 DIAGNOSIS — R296 Repeated falls: Secondary | ICD-10-CM | POA: Insufficient documentation

## 2013-06-28 DIAGNOSIS — R29898 Other symptoms and signs involving the musculoskeletal system: Secondary | ICD-10-CM

## 2013-06-28 DIAGNOSIS — J449 Chronic obstructive pulmonary disease, unspecified: Secondary | ICD-10-CM | POA: Insufficient documentation

## 2013-06-28 DIAGNOSIS — Z9861 Coronary angioplasty status: Secondary | ICD-10-CM | POA: Insufficient documentation

## 2013-06-28 DIAGNOSIS — Z794 Long term (current) use of insulin: Secondary | ICD-10-CM | POA: Diagnosis not present

## 2013-06-28 DIAGNOSIS — S0990XA Unspecified injury of head, initial encounter: Secondary | ICD-10-CM | POA: Insufficient documentation

## 2013-06-28 DIAGNOSIS — K117 Disturbances of salivary secretion: Secondary | ICD-10-CM | POA: Insufficient documentation

## 2013-06-28 DIAGNOSIS — J4489 Other specified chronic obstructive pulmonary disease: Secondary | ICD-10-CM | POA: Insufficient documentation

## 2013-06-28 DIAGNOSIS — S79929A Unspecified injury of unspecified thigh, initial encounter: Secondary | ICD-10-CM | POA: Diagnosis not present

## 2013-06-28 DIAGNOSIS — M25559 Pain in unspecified hip: Secondary | ICD-10-CM | POA: Diagnosis not present

## 2013-06-28 DIAGNOSIS — Y939 Activity, unspecified: Secondary | ICD-10-CM | POA: Insufficient documentation

## 2013-06-28 LAB — URINALYSIS, ROUTINE W REFLEX MICROSCOPIC
Glucose, UA: NEGATIVE mg/dL
Ketones, ur: NEGATIVE mg/dL
Leukocytes, UA: NEGATIVE
Nitrite: NEGATIVE
Protein, ur: 100 mg/dL — AB
Specific Gravity, Urine: >1.03 — ABNORMAL HIGH (ref 1.005–1.030)

## 2013-06-28 LAB — CBC WITH DIFFERENTIAL/PLATELET
Basophils Absolute: 0 10*3/uL (ref 0.0–0.1)
Basophils Relative: 0 % (ref 0–1)
Eosinophils Absolute: 0.1 10*3/uL (ref 0.0–0.7)
Eosinophils Relative: 1 % (ref 0–5)
HCT: 44.4 % (ref 39.0–52.0)
Hemoglobin: 14.2 g/dL (ref 13.0–17.0)
MCH: 27 pg (ref 26.0–34.0)
MCHC: 32 g/dL (ref 30.0–36.0)
Monocytes Absolute: 0.7 10*3/uL (ref 0.1–1.0)
Monocytes Relative: 8 % (ref 3–12)
RDW: 14.5 % (ref 11.5–15.5)

## 2013-06-28 LAB — COMPREHENSIVE METABOLIC PANEL
AST: 15 U/L (ref 0–37)
Albumin: 3.7 g/dL (ref 3.5–5.2)
BUN: 9 mg/dL (ref 6–23)
Calcium: 9 mg/dL (ref 8.4–10.5)
Creatinine, Ser: 0.9 mg/dL (ref 0.50–1.35)
Sodium: 140 mEq/L (ref 137–147)
Total Bilirubin: 0.3 mg/dL (ref 0.3–1.2)
Total Protein: 6.7 g/dL (ref 6.0–8.3)

## 2013-06-28 LAB — URINE MICROSCOPIC-ADD ON: Urine-Other: NONE SEEN

## 2013-06-28 MED ORDER — MORPHINE SULFATE 4 MG/ML IJ SOLN
6.0000 mg | Freq: Once | INTRAMUSCULAR | Status: AC
  Administered 2013-06-28: 6 mg via INTRAVENOUS
  Filled 2013-06-28: qty 2

## 2013-06-28 MED ORDER — ASPIRIN 81 MG PO CHEW
324.0000 mg | CHEWABLE_TABLET | Freq: Once | ORAL | Status: AC
  Administered 2013-06-28: 324 mg via ORAL
  Filled 2013-06-28: qty 4

## 2013-06-28 MED ORDER — SODIUM CHLORIDE 0.9 % IJ SOLN
INTRAMUSCULAR | Status: AC
  Filled 2013-06-28: qty 500

## 2013-06-28 NOTE — ED Provider Notes (Signed)
CSN: [id number]     Arrival date & time 06/28/13  1258 History  This chart was scribed for Enid Skeens, MD, by Yevette Edwards, ED Scribe. This patient was seen in room APA17/APA17 and the patient's care was started at 1:46 PM.  First MD Initiated Contact with Patient 06/28/13 1338     Chief Complaint  Patient presents with  . frequent falls    The history is provided by the patient and the spouse. No language interpreter was used.   HPI Comments: Herbert May is a 72 y.o. male, with a h/o COPD, DM, CAD, MI, and HTN, who presents to the Emergency Department complaining of falls which have been occurring several times for 3-4 days. He reports he experiences pain, more than weakness, to his left leg and attributes the pain to increased falls. At baseline the pt walks with a cane due to his back pain. He states his is experiencing drooling from the left side of his mouth, intermittent headaches, and visual disturbances which he characterizes as "blurriness." His wife reports the pt has also experienced forgetfulness and stuttering. The pt denies numbness/ paresthesia, weakness to his arms, chest pain, neck pain, abdominal pain, extremity swelling. The pt has back pain at baseline. He denies a h/o stroke. He reports he is allergic to aspirin.  The pt is a former smoker.   Past Medical History  Diagnosis Date  . Heart attack   . COPD (chronic obstructive pulmonary disease)   . Colostomy status   . Diabetes mellitus without complication   . Hypertension   . Hyperlipidemia   . 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 Colostomy    . Nose surgery    . Back surgery    . Hernia repair    . Knee surgery    . Cardiac stents    . Colostomy closure     Family History  Problem Relation Age of Onset  . Cancer Mother   . Diabetes Mother   . COPD Mother   . Cancer Sister     brain  . Cancer Sister     brain    History  Substance Use Topics  . Smoking status: Former Smoker -- 0.50 packs/day for 16 years    Types: Cigarettes    Quit date: 12/28/1990  . Smokeless tobacco: Not on file  . Alcohol Use: No    Review of Systems  Eyes: Positive for visual disturbance.  Cardiovascular: Negative for chest pain and leg swelling.  Gastrointestinal: Negative for abdominal pain.  Musculoskeletal: Positive for back pain (Baseline).  Neurological: Positive for weakness and headaches. Negative for numbness.  All other systems reviewed and are negative.    Allergies  Aspirin; Fish allergy; Ibuprofen; Latex; Onion; Other; Levaquin; and Neosporin  Home Medications   Current Outpatient Rx  Name  Route  Sig  Dispense  Refill  . albuterol (PROAIR HFA) 108 (90 BASE) MCG/ACT inhaler   Inhalation   Inhale 1 puff into the lungs 4 (four) times daily.          Marland Kitchen albuterol (PROVENTIL) (2.5 MG/3ML) 0.083% nebulizer solution   Nebulization   Take 3 mLs (2.5 mg total) by nebulization every 4 (four) hours as needed for wheezing or shortness of breath.   75 mL   5     COPD 496  Length of need 1 year   . amoxicillin (AMOXIL) 875 MG tablet  Oral   Take 1 tablet (875 mg total) by mouth 2 (two) times daily.   20 tablet   0   . budesonide-formoterol (SYMBICORT) 160-4.5 MCG/ACT inhaler   Inhalation   Inhale 2 puffs into the lungs daily.   1 Inhaler   4   . diazepam (VALIUM) 5 MG tablet   Oral   Take 1 tablet (5 mg total) by mouth 3 (three) times daily as needed for anxiety (muscle spasms).   60 tablet   0   . diltiazem (CARDIZEM) 30 MG tablet   Oral   Take 30 mg by mouth 2 (two) times daily.         . fentaNYL (DURAGESIC - DOSED MCG/HR) 100 MCG/HR   Transdermal   Place 1 patch onto the skin every 3 (three) days.         . ferrous sulfate 325 (65 FE) MG tablet   Oral   Take 325 mg by mouth daily with breakfast.         . fexofenadine (ALLEGRA) 180 MG tablet   Oral   Take 180 mg by  mouth daily.         . fluticasone (FLONASE) 50 MCG/ACT nasal spray   Nasal   Place 2 sprays into the nose daily.   16 g   5   . fluticasone (FLOVENT HFA) 110 MCG/ACT inhaler   Inhalation   Inhale 1 puff into the lungs 2 (two) times daily.         . furosemide (LASIX) 40 MG tablet      TAKE 1 TABLET BY MOUTH ONCE DAILY   30 tablet   1     Generic ZOX:WRUEA    40MG  Generic VWU:JWJXB    19M .Marland Kitchen.   . gabapentin (NEURONTIN) 300 MG capsule   Oral   Take 300 mg by mouth 3 (three) times daily.         . insulin detemir (LEVEMIR) 100 UNIT/ML injection   Subcutaneous   Inject 0.6 mLs (60 Units total) into the skin 2 (two) times daily.   40 mL   5   . Insulin Syringe-Needle U-100 (INSULIN SYRINGE 1CC/31GX5/16") 31G X 5/16" 1 ML MISC   Does not apply   100 Units by Does not apply route every morning.   50 each   10   . ipratropium (ATROVENT) 0.02 % nebulizer solution   Nebulization   Take 2.5 mLs (500 mcg total) by nebulization 4 (four) times daily.   75 mL   5   . lidocaine (LIDODERM) 5 %   Transdermal   Place 2 patches onto the skin daily. Remove & Discard patch within 12 hours or as directed by MD   60 patch   3   . lidocaine (XYLOCAINE) 5 % ointment   Topical   Apply 1 application topically daily.          . metFORMIN (GLUCOPHAGE) 500 MG tablet   Oral   Take 1 tablet (500 mg total) by mouth 2 (two) times daily.   60 tablet   11     NTBS   . oxyCODONE-acetaminophen (PERCOCET) 10-325 MG per tablet   Oral   Take 1 tablet by mouth 4 (four) times daily.         . pantoprazole (PROTONIX) 40 MG tablet      take 1 tablet by mouth once daily   30 tablet   5   . potassium chloride SA (K-DUR,KLOR-CON) 20  MEQ tablet   Oral   Take 1 tablet (20 mEq total) by mouth daily.   30 tablet   5   . simvastatin (ZOCOR) 40 MG tablet      TAKE 1 TABLET BY MOUTH EVERY EVENING   30 tablet   1     Generic YQM:VHQIO    40MG  Generic NGE:XBMWU    86M .Marland Kitchen.   .  temazepam (RESTORIL) 7.5 MG capsule   Oral   Take 1 capsule (7.5 mg total) by mouth at bedtime.   30 capsule   3     Last filled 11/14/2012   . tiotropium (SPIRIVA) 18 MCG inhalation capsule   Inhalation   Place 1 capsule (18 mcg total) into inhaler and inhale daily.   30 capsule   5   . traMADol (ULTRAM) 50 MG tablet   Oral   Take 1 tablet (50 mg total) by mouth 2 (two) times daily.   60 tablet   5   . traZODone (DESYREL) 100 MG tablet   Oral   Take 100 mg by mouth at bedtime. Sleep          Triage Vitals: BP 131/75  Pulse 88  Temp(Src) 98 F (36.7 C) (Oral)  Resp 20  Ht 5\' 7"  (1.702 m)  Wt 212 lb (96.163 kg)  BMI 33.20 kg/m2  SpO2 96%  Physical Exam  Nursing note and vitals reviewed. Constitutional: He is oriented to person, place, and time. He appears well-developed and well-nourished. No distress.  HENT:  Head: Normocephalic and atraumatic.  Eyes: EOM are normal. Pupils are equal, round, and reactive to light.  Neck: Normal range of motion. Neck supple. No JVD present. No tracheal deviation present.  Cardiovascular: Normal rate.   Pulmonary/Chest: Effort normal. No respiratory distress.  Musculoskeletal: Normal range of motion. He exhibits tenderness (lumbar paraspinal).  Paraspinal lumbar tenderness.   Neurological: He is alert and oriented to person, place, and time. GCS eye subscore is 4. GCS verbal subscore is 5. GCS motor subscore is 6.  Visual fields intact to four fingers. No obvious facial droop.  Finger-nose intact. Cranial nerves intact.  Sensation to palpation intact.  No drift in the right leg.  Good strength at knee, great toe, and ankle with F/E. 2+ reflex to right.  No reflex on left, no knee cap on left.   Skin: Skin is warm and dry.  Psychiatric: He has a normal mood and affect. His behavior is normal.    ED Course  Procedures (including critical care time)  DIAGNOSTIC STUDIES: Oxygen Saturation is 96% on room air, normal by my  interpretation.    COORDINATION OF CARE:  1:57 PM- Discussed treatment plan with patient, and the patient agreed to the plan.   3:26 PM- Rechecked pt. Pt states he is experiencing back pain, and he states the falls may be attributed to his back pain. Informed pt of imaging and recommended he follow up with his PCP. Also informed pt to use a baby aspirin a day if he can tolerate it. Advised pt to return to ED if symptoms worsen or new symptoms occur.   Labs Review Labs Reviewed  COMPREHENSIVE METABOLIC PANEL - Abnormal; Notable for the following:    GFR calc non Af Amer 83 (*)    All other components within normal limits  URINALYSIS, ROUTINE W REFLEX MICROSCOPIC - Abnormal; Notable for the following:    Specific Gravity, Urine >1.030 (*)    Hgb urine dipstick LARGE (*)  Bilirubin Urine SMALL (*)    Protein, ur 100 (*)    All other components within normal limits  CBC WITH DIFFERENTIAL  URINE MICROSCOPIC-ADD ON   Imaging Review Dg Lumbar Spine Complete  06/28/2013   CLINICAL DATA:  Chronic pain extending into the posterior left hip. Multiple falls.  EXAM: LUMBAR SPINE - COMPLETE 4+ VIEW  COMPARISON:  Lumbar spine radiographs 09/07/2012.  FINDINGS: The patient is status post posterior fusion of lumbar spine at L2-3, L3-4, and L4-5. A vacuum disc is present at L5-S1. There is some lucency suggested about the L5 pedicle screws, raising the possibility of movement. 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 Wide laminectomies are noted at L2, L3, and L4. Osteopenia is noted. Atherosclerotic calcifications are present in the aorta without evidence for aneurysm.  IMPRESSION: 1. Stable appearance of posterior fusion at L2-3 and L3-4. 2. Lucency surrounding the L5 pedicle screws raising concern for motion at this level. 3. Vacuum phenomenon at L5-S1 is similar to the prior exam. 4. Atherosclerosis of the abdominal aorta without aneurysm.   Electronically Signed   By: Gennette Pac M.D.   On: 06/28/2013 15:19    Dg Hip Complete Left  06/28/2013   CLINICAL DATA:  Hip pain.  Multiple falls.  EXAM: LEFT HIP - COMPLETE 2+ VIEW  COMPARISON:  None.  FINDINGS: Lower lumbar laminectomy and fusion. Vascular calcification indicating peripheral vascular disease. Pelvic phleboliths. Degenerative changes lumbar spine, both SI joints, both hips. No evidence of fracture or dislocation.  IMPRESSION: 1. No acute abnormality. Degenerative changes lumbar spine, both SI joints, both hips.  2. Prior lower lumbar fusion and laminectomy .   Electronically Signed   By: Maisie Fus  Register   On: 06/28/2013 15:18   Ct Head Wo Contrast  06/28/2013   CLINICAL DATA:  Left-sided weakness.  Diabetes.  EXAM: CT HEAD WITHOUT CONTRAST  TECHNIQUE: Contiguous axial images were obtained from the base of the skull through the vertex without intravenous contrast.  COMPARISON:  06/29/2012  FINDINGS: Right inferior frontal encephalomalacia, chronic. Otherwise, the brainstem, cerebellum, cerebral peduncles, thalamus, basal ganglia, basilar cisterns, and ventricular system appear within normal limits. No intracranial hemorrhage, mass lesion, or acute CVA. Mild nasal mucosal thickening and minimal chronic ethmoid sinusitis.  IMPRESSION: 1. No acute intracranial findings. 2. Chronic right inferior frontal lobe encephalomalacia. 3. Minimal chronic ethmoid sinusitis.   Electronically Signed   By: Herbie Baltimore M.D.   On: 06/28/2013 14:56    EKG Interpretation    Date/Time:  Wednesday June 28 2013 13:06:57 EST Ventricular Rate:  83 PR Interval:  154 QRS Duration: 94 QT Interval:  352 QTC Calculation: 413 R Axis:   -67 Text Interpretation:  Sinus rhythm with marked sinus arrhythmia Left anterior fascicular block Inferior-posterior infarct (cited on or before 28-Sep-2012) Similar to previous. Confirmed by Sharae Zappulla  MD, Maygan Koeller ([id number]) on 06/28/2013 2:10:00 PM            MDM   1. Lower back pain   2. Falls frequently   3. Transient left  leg weakness    I personally performed the services described in this documentation, which was scribed in my presence. The recorded information has been reviewed and is accurate.  Difficult to discern acute on chronic pain causing falls vs stroke. Back pain similar to previous, no fevers, no new injuries.  Very subtle weakness left LE which appears chronic As pt normally uses cane.  With new drooling left side this is more concerning for small stroke.  Pt has rash allergy to asa. Walked pt with cane, suddenly he would have left hip pain causing his leg to be weak.  NV intact LE.    Plan for CT head, labs.  EKG similar to previous.  CT no acute findings. Pt feels at baseline at recheck.  Doubt stroke at this time, likely related to pain and leg gives way. No bowel or bladder changes, normal neuro exam in ED. Discussed fup for outpt MRIs with pt who is comfortable with plan.  Results and differential diagnosis were discussed with the patient. Close follow up outpatient was discussed, patient comfortable with the plan.   Diagnosis: above  Enid Skeens, MD 06/28/13 [phone]

## 2013-06-28 NOTE — ED Notes (Signed)
Pt reports has been having difficulty walking and has had several falls over the past few days.  Pt says isn't sure why he is having difficulty walking but  has been having lower back pain.

## 2013-06-30 ENCOUNTER — Telehealth: Payer: Self-pay | Admitting: Family Medicine

## 2013-07-03 ENCOUNTER — Ambulatory Visit (INDEPENDENT_AMBULATORY_CARE_PROVIDER_SITE_OTHER): Payer: Medicare Other | Admitting: Family Medicine

## 2013-07-03 ENCOUNTER — Encounter: Payer: Self-pay | Admitting: Family Medicine

## 2013-07-03 VITALS — BP 123/79 | HR 78 | Temp 97.4°F | Ht 67.0 in | Wt 221.0 lb

## 2013-07-03 DIAGNOSIS — M5137 Other intervertebral disc degeneration, lumbosacral region: Secondary | ICD-10-CM | POA: Diagnosis not present

## 2013-07-03 DIAGNOSIS — M25569 Pain in unspecified knee: Secondary | ICD-10-CM

## 2013-07-03 DIAGNOSIS — M25562 Pain in left knee: Secondary | ICD-10-CM

## 2013-07-03 DIAGNOSIS — Z09 Encounter for follow-up examination after completed treatment for conditions other than malignant neoplasm: Secondary | ICD-10-CM

## 2013-07-03 DIAGNOSIS — M25552 Pain in left hip: Secondary | ICD-10-CM

## 2013-07-03 DIAGNOSIS — M5136 Other intervertebral disc degeneration, lumbar region: Secondary | ICD-10-CM

## 2013-07-03 DIAGNOSIS — M25559 Pain in unspecified hip: Secondary | ICD-10-CM | POA: Diagnosis not present

## 2013-07-03 NOTE — Progress Notes (Signed)
   Subjective:    Patient ID: Herbert May, male    DOB: 1941-02-05, 73 y.o.   MRN: [id number]  HPI This 73 y.o. male presents for evaluation of having difficulty walking, having problems with  Back pain, arthritis pain, and shoulder pain.  He has been having difficulty walking and has fallen. He was recently seen in the ED and did have xrays. He had been having intermittant left leg weakness And he had a fall so was taken to the ED and CT of head showed no acute pathology or CVA but Chronic right frontal encephalomacia.  He had xray of left knee showing DJD, left hip with arthritis And LS spine showing lucency over L5 screw.  He was advised to follow up with PCP.  He has  Hx of CAD and has 3 coronary artery stents.  He is requesting cardiology referral.  He has not seen A cardiologist here in [place] since moving from [place] a few years ago.  He wants referral to Orthopedics For his left hip and left knee.  He has been seeing pain management for his chronic pain.  He has hx of DDD of the LS spine and s/p back surgery.  He has been taking ASA 325mg  po qd.  He is using a cane And still has ambulation problems.   Review of Systems No chest pain, SOB, HA, dizziness, vision change, N/V, diarrhea, constipation, dysuria, urinary urgency or frequency, myalgias, arthralgias or rash.     Objective:   Physical Exam Vital signs noted  Chronically ill appearing male in NAD  HEENT - Head atraumatic Normocephalic                Eyes - PERRLA, Conjuctiva - clear Sclera- Clear EOMI                Ears - EAC's Wnl TM's Wnl Gross Hearing WNL                Throat - oropharanx wnl Respiratory - Lungs CTA bilateral Cardiac - RRR S1 and S2 without murmur GI - Abdomen soft Nontender and bowel sounds active x 4 Extremities - No edema. Neuro - Grossly intact. Gait - Gait unsteady       Assessment & Plan:  Hip pain, left - Plan: AMB referral to orthopedics  Knee pain, left - Plan: AMB referral to  orthopedics  DDD (degenerative disc disease), lumbar  Cardiology follow-up encounter - Plan: Ambulatory referral to Cardiology  Have him first see cardiology and orthopedics and see if this helps his ambulation. Order for 4 point walker.    Follow up in one month  Lysbeth Penner FNP

## 2013-07-03 NOTE — Patient Instructions (Signed)
Coronary Angiography with Stent This is a procedure to widen or open a narrow blood vessel of the heart (coronary artery). When a coronary artery becomes partially blocked it decreases blood flow to that area. This may lead to chest pain or a heart attack (myocardial infarction). Arteries may become blocked by cholesterol buildup (plaque) in the lining or wall. A stent is a small piece of metal that looks like a mesh or a spring. Stent placement may be done right after an angiogram that finds a blocked artery or as a treatment for a heart attack. RISKS AND COMPLICATIONS  Damage to the heart.  A blockage may return.  Bleeding at the site.  Blood clot to another part of the body. PROCEDURE  You may be given a medication to help you relax before and during the procedure through an IV in your hand or arm.  A local anesthetic to make the area numb may be used before inserting the catheter (a long, hollow tube about the size of a piece of cooked spaghetti).  You will be prepared for the procedure by washing and shaving the area where the catheter will be inserted.  A specially trained doctor will insert the catheter with a guide wire into an artery. This is guided under a special type of X-ray (fluoroscopy) to the opening of the blocked artery.  Special dye is then injected and X-rays are taken.  A tiny wire is guided to the blocked spot and a balloon is inflated to make the artery wider. The stent is expanded and crushes the plaque into the wall of the vessel. The stent holds the area open like a scaffolding and improves the blood flow.  Sometimes the artery may be made wider using a laser or other tools to remove plaque.  When the blood flow is better, the catheter is removed. The lining of the artery will grow over the stent which stays where it was placed. AFTER THE PROCEDURE  You will stay in bed for several hours.  The access site will be watched and you will be checked  frequently.  Blood tests, X-rays and an EKG may be done.  You may stay in the hospital overnight for observation. SEEK IMMEDIATE MEDICAL CARE IF:   You develop chest pain, shortness of breath, feel faint, or pass out.  There is bleeding, swelling, or drainage from the catheter insertion site.  You develop pain, discoloration, coldness, or severe bruising in the leg or arm that held the catheter.  You see blood in your urine or stool. This may be bright red blood in urine or stools, or also appear as black, tarry stools.  You have a fever. Document Released: 12/20/2002 Document Revised: 02/15/2013 Document Reviewed: 12/22/2012 [facility] Patient Information 2014 [place].

## 2013-07-06 ENCOUNTER — Telehealth: Payer: Self-pay | Admitting: Family Medicine

## 2013-07-10 ENCOUNTER — Telehealth: Payer: Self-pay | Admitting: Family Medicine

## 2013-07-10 DIAGNOSIS — M545 Low back pain, unspecified: Secondary | ICD-10-CM | POA: Diagnosis not present

## 2013-07-10 DIAGNOSIS — M25569 Pain in unspecified knee: Secondary | ICD-10-CM | POA: Diagnosis not present

## 2013-07-10 DIAGNOSIS — M961 Postlaminectomy syndrome, not elsewhere classified: Secondary | ICD-10-CM | POA: Diagnosis not present

## 2013-07-10 DIAGNOSIS — G894 Chronic pain syndrome: Secondary | ICD-10-CM | POA: Diagnosis not present

## 2013-07-10 NOTE — Telephone Encounter (Signed)
Changed in system.

## 2013-07-11 ENCOUNTER — Other Ambulatory Visit: Payer: Self-pay | Admitting: *Deleted

## 2013-07-11 ENCOUNTER — Other Ambulatory Visit: Payer: Self-pay

## 2013-07-11 MED ORDER — FEXOFENADINE HCL 180 MG PO TABS: 180.0000 mg | ORAL_TABLET | Freq: Every day | ORAL | Status: DC

## 2013-07-11 MED ORDER — DILTIAZEM HCL 30 MG PO TABS: 30.0000 mg | ORAL_TABLET | Freq: Two times a day (BID) | ORAL | Status: DC

## 2013-07-11 MED ORDER — SIMVASTATIN 40 MG PO TABS: 40.0000 mg | ORAL_TABLET | Freq: Every day | ORAL | Status: DC

## 2013-07-11 MED ORDER — FUROSEMIDE 40 MG PO TABS: 40.0000 mg | ORAL_TABLET | Freq: Every day | ORAL | Status: DC

## 2013-07-11 MED ORDER — FLUTICASONE PROPIONATE HFA 110 MCG/ACT IN AERO: 1.0000 | INHALATION_SPRAY | Freq: Two times a day (BID) | RESPIRATORY_TRACT | Status: DC

## 2013-07-12 ENCOUNTER — Telehealth: Payer: Self-pay | Admitting: Family Medicine

## 2013-07-12 ENCOUNTER — Other Ambulatory Visit: Payer: Self-pay

## 2013-07-12 MED ORDER — CYCLOBENZAPRINE HCL 10 MG PO TABS: 10.0000 mg | ORAL_TABLET | Freq: Every day | ORAL | Status: DC

## 2013-07-12 MED ORDER — ALBUTEROL SULFATE HFA 108 (90 BASE) MCG/ACT IN AERS
1.0000 | INHALATION_SPRAY | RESPIRATORY_TRACT | Status: DC | PRN
Start: 2013-07-11 — End: 2013-10-31

## 2013-07-12 MED ORDER — TRAZODONE HCL 100 MG PO TABS: 100.0000 mg | ORAL_TABLET | Freq: Every day | ORAL | Status: DC

## 2013-07-12 MED ORDER — FUROSEMIDE 40 MG PO TABS: 40.0000 mg | ORAL_TABLET | Freq: Every day | ORAL | Status: DC

## 2013-07-13 ENCOUNTER — Ambulatory Visit (HOSPITAL_COMMUNITY)
Admission: RE | Admit: 2013-07-13 | Discharge: 2013-07-13 | Disposition: A | Discharge status: Home / Self Care | Payer: Medicare Other | Source: Ambulatory Visit | Attending: Physical Medicine and Rehabilitation | Admitting: Physical Medicine and Rehabilitation

## 2013-07-13 ENCOUNTER — Other Ambulatory Visit (HOSPITAL_COMMUNITY): Payer: Self-pay | Admitting: Physical Medicine and Rehabilitation

## 2013-07-13 DIAGNOSIS — W19XXXA Unspecified fall, initial encounter: Secondary | ICD-10-CM | POA: Insufficient documentation

## 2013-07-13 DIAGNOSIS — M545 Low back pain, unspecified: Secondary | ICD-10-CM

## 2013-07-13 DIAGNOSIS — Z981 Arthrodesis status: Secondary | ICD-10-CM | POA: Diagnosis not present

## 2013-07-13 DIAGNOSIS — M51379 Other intervertebral disc degeneration, lumbosacral region without mention of lumbar back pain or lower extremity pain: Secondary | ICD-10-CM | POA: Diagnosis not present

## 2013-07-13 DIAGNOSIS — M5137 Other intervertebral disc degeneration, lumbosacral region: Secondary | ICD-10-CM | POA: Diagnosis not present

## 2013-07-13 DIAGNOSIS — M47817 Spondylosis without myelopathy or radiculopathy, lumbosacral region: Secondary | ICD-10-CM | POA: Diagnosis not present

## 2013-07-13 DIAGNOSIS — M549 Dorsalgia, unspecified: Secondary | ICD-10-CM | POA: Insufficient documentation

## 2013-07-14 NOTE — Telephone Encounter (Signed)
Pt. Does not want anything to do with this company or changing meds

## 2013-07-14 NOTE — Telephone Encounter (Signed)
Barnett Applebaum, can you check on this please  Thanks bill

## 2013-07-18 ENCOUNTER — Ambulatory Visit (INDEPENDENT_AMBULATORY_CARE_PROVIDER_SITE_OTHER): Payer: Medicare Other | Admitting: Cardiovascular Disease

## 2013-07-18 ENCOUNTER — Encounter: Payer: Self-pay | Admitting: Cardiovascular Disease

## 2013-07-18 VITALS — BP 112/75 | HR 87 | Ht 67.0 in | Wt 220.0 lb

## 2013-07-18 DIAGNOSIS — I2510 Atherosclerotic heart disease of native coronary artery without angina pectoris: Secondary | ICD-10-CM | POA: Diagnosis not present

## 2013-07-18 DIAGNOSIS — I359 Nonrheumatic aortic valve disorder, unspecified: Secondary | ICD-10-CM

## 2013-07-18 DIAGNOSIS — I10 Essential (primary) hypertension: Secondary | ICD-10-CM

## 2013-07-18 DIAGNOSIS — E785 Hyperlipidemia, unspecified: Secondary | ICD-10-CM | POA: Diagnosis not present

## 2013-07-18 DIAGNOSIS — I350 Nonrheumatic aortic (valve) stenosis: Secondary | ICD-10-CM

## 2013-07-18 NOTE — Progress Notes (Signed)
Patient ID: Cutberto Winfree, male   DOB: 04-17-1941, 73 y.o.   MRN: [id number]      SUBJECTIVE: Mr. Eggenberger is a 73 y.o. Male who moved here from [place] approximately one year ago. He has a h/o CAD s/p 3 stents, MI x 2, HTN, dyslipidemia, type II diabetes mellitus, and a h/o smoking in the past, but quit 23 years ago.  He denies chest pain. He has chronic dyspnea related to COPD which has not increased in severity.  He takes Lasix, diltiazem, and simvastatin, and Dr. Zenon Mayo note mentions he is intolerant of beta blockers.  His most recent echocardiogram was in May 2013, which showed normal LV systolic function, EF 29-52%, mild LVH, mild MR/TR/AS, with a peak aortic valve gradient of 26 mmHg.  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  He's had balance issues related to problems with his left hip. His wife has also noticed he's been stuttering more frequently, and occasionally drools. He walks with a cane. He has occasional lightheadedness.     Allergies  Allergen Reactions  . Aspirin Hives and Itching  . Fish Allergy Nausea And Vomiting  . Ibuprofen Hives and Itching  . Latex Other (See Comments)    Tears Skin   . Onion Nausea And Vomiting  . Other Other (See Comments)    Just doesn't like [place]   . Levaquin [Levofloxacin In D5w] Rash  . Neosporin [Neomycin-Bacitracin Zn-Polymyx] Rash    Current Outpatient Prescriptions  Medication Sig Dispense Refill  . albuterol (PROAIR HFA) 108 (90 BASE) MCG/ACT inhaler Inhale 1 puff into the lungs every 4 (four) hours as needed for wheezing or shortness of breath.  1 Inhaler  1  . albuterol (PROVENTIL) (2.5 MG/3ML) 0.083% nebulizer solution Take 3 mLs (2.5 mg total) by nebulization every 4 (four) hours as needed for wheezing or shortness of  breath.  75 mL  5  . budesonide-formoterol (SYMBICORT) 160-4.5 MCG/ACT inhaler Inhale 2 puffs into the lungs daily.  1 Inhaler  4  . cyclobenzaprine (FLEXERIL) 10 MG tablet Take 1 tablet (10 mg total) by mouth at bedtime.  30 tablet  1  . diazepam (VALIUM) 5 MG tablet Take 1 tablet (5 mg total) by mouth 3 (three) times daily as needed for anxiety (muscle spasms).  60 tablet  0  . diltiazem (CARDIZEM) 30 MG tablet Take 1 tablet (30 mg total) by mouth 2 (two) times daily.  30 tablet  2  . fentaNYL (DURAGESIC - DOSED MCG/HR) 100 MCG/HR Place 1 patch onto the skin every 3 (three) days.      . fexofenadine (ALLEGRA) 180 MG tablet Take 1 tablet (180 mg total) by mouth daily.  30 tablet  2  . fluticasone (FLONASE) 50 MCG/ACT nasal spray Place 2 sprays into the nose daily.  16 g  5  . fluticasone (FLOVENT HFA) 110 MCG/ACT inhaler Inhale 1 puff into the lungs 2 (two) times daily.  1 Inhaler  2  . furosemide (LASIX) 40 MG tablet Take 1 tablet (40 mg total) by mouth daily.  30 tablet  5  . gabapentin (NEURONTIN) 300 MG capsule Take 300 mg by mouth 3 (three) times daily.      . insulin detemir (LEVEMIR) 100 UNIT/ML injection Inject 0.6 mLs (60 Units total) into the skin 2 (  two) times daily.  40 mL  5  . Insulin Syringe-Needle U-100 (INSULIN SYRINGE 1CC/31GX5/16") 31G X 5/16" 1 ML MISC 100 Units by Does not apply route every morning.  50 each  10  . ipratropium (ATROVENT) 0.02 % nebulizer solution Take 2.5 mLs (500 mcg total) by nebulization 4 (four) times daily.  75 mL  5  . lidocaine (LIDODERM) 5 % Place 2 patches onto the skin daily. Remove & Discard patch within 12 hours or as directed by MD  60 patch  3  . metFORMIN (GLUCOPHAGE) 500 MG tablet Take 1 tablet (500 mg total) by mouth 2 (two) times daily.  60 tablet  11  . oxyCODONE-acetaminophen (PERCOCET) 10-325 MG per tablet Take 1 tablet by mouth 4 (four) times daily.      . pantoprazole (PROTONIX) 40 MG tablet take 1 tablet by mouth once daily  30 tablet  5   . potassium chloride SA (K-DUR,KLOR-CON) 20 MEQ tablet Take 1 tablet (20 mEq total) by mouth daily.  30 tablet  5  . simvastatin (ZOCOR) 40 MG tablet Take 1 tablet (40 mg total) by mouth daily at 6 PM.  30 tablet  1  . tiotropium (SPIRIVA) 18 MCG inhalation capsule Place 1 capsule (18 mcg total) into inhaler and inhale daily.  30 capsule  5  . traMADol (ULTRAM) 50 MG tablet Take 1 tablet (50 mg total) by mouth 2 (two) times daily.  60 tablet  5  . traZODone (DESYREL) 100 MG tablet Take 1 tablet (100 mg total) by mouth at bedtime. Sleep  30 tablet  1   No current facility-administered medications for this visit.    Past Medical History  Diagnosis Date  . Heart attack   . COPD (chronic obstructive pulmonary disease)   . Colostomy status   . Diabetes mellitus without complication   . Hypertension   . Hyperlipidemia   . CAD (coronary artery disease) of artery bypass graft 12/27/2012    Past Surgical History  Procedure Laterality Date  . Colostomy    . Nose surgery    . Back surgery    . Hernia repair    . Knee surgery    . Cardiac stents    . Colostomy closure      History   Social History  . Marital Status: Married    Spouse Name: N/A    Number of Children: N/A  . Years of Education: N/A   Occupational History  . Not on file.   Social History Main Topics  . Smoking status: Former Smoker -- 0.50 packs/day for 16 years    Types: Cigarettes    Quit date: 12/28/1990  . Smokeless tobacco: Former Systems developer    Quit date: 06/30/1983  . Alcohol Use: No  . Drug Use: No  . Sexual Activity: Not on file   Other Topics Concern  . Not on file   Social History Narrative  . No narrative on file     Filed Vitals:   07/18/13 1131  BP: 112/75  Pulse: 87  Height: 5\' 7"  (1.702 m)  Weight: 220 lb (99.791 kg)    PHYSICAL EXAM General: NAD Neck: No JVD, no thyromegaly or thyroid nodule.  Lungs: Clear to auscultation bilaterally with normal respiratory effort. CV: Nondisplaced  PMI.  Heart regular S1/S2, no S3/S4, no murmur.  No peripheral edema.  No carotid bruit.  Normal pedal pulses.  Abdomen: Soft, nontender, no hepatosplenomegaly, no distention.  Neurologic: Alert and oriented x 3.  Psych: Normal affect. Extremities: No clubbing or cyanosis.   ECG: reviewed and available in electronic records.      ASSESSMENT AND PLAN: 1. CAD s/p 3 stents and 2 MI's: no symptoms at present. I will continue to monitor him for symptoms at this time with no plans for stress testing. He has normal LV systolic function. His most recent cardiac catheterization from 02/16/2007 showed wide patency of the mid-LAD stent and at that time, he underwent successful stenting of the proximal LAD. There was insignificant disease in the LCx and RCA. He is allergic to ASA and reportedly intolerant to beta blockers. 2. Aortic stenosis-mild, continue to monitor.  3. HTN-controlled.  4. Dyslipidemia-continue simvastatin. 5. Imbalance and gait disturbance: he is scheduled to see orthopedic surgery on 1/22. 6. Speech impediment with sialorrhea: I've recommended he f/u with a neurologist.  Dispo: f/u 6 months.  Kate Sable, M.D., F.A.C.C.

## 2013-07-18 NOTE — Patient Instructions (Signed)
Your physician recommends that you schedule a follow-up appointment in: 6 months with Dr. Koneswaran. You should receive a letter in the mail in 4 months. If you do not receive this letter by May 2015 call our office to schedule this appointment.   Your physician recommends that you continue on your current medications as directed. Please refer to the Current Medication list given to you today.   

## 2013-07-20 DIAGNOSIS — M25559 Pain in unspecified hip: Secondary | ICD-10-CM | POA: Diagnosis not present

## 2013-07-20 DIAGNOSIS — M76899 Other specified enthesopathies of unspecified lower limb, excluding foot: Secondary | ICD-10-CM | POA: Diagnosis not present

## 2013-07-20 DIAGNOSIS — M25569 Pain in unspecified knee: Secondary | ICD-10-CM | POA: Diagnosis not present

## 2013-07-21 ENCOUNTER — Telehealth: Payer: Self-pay | Admitting: Family Medicine

## 2013-07-21 ENCOUNTER — Other Ambulatory Visit: Payer: Self-pay | Admitting: Family Medicine

## 2013-07-21 MED ORDER — DIAZEPAM 5 MG PO TABS: 5.0000 mg | ORAL_TABLET | Freq: Three times a day (TID) | ORAL | Status: DC | PRN

## 2013-07-24 ENCOUNTER — Other Ambulatory Visit: Payer: Self-pay | Admitting: Family Medicine

## 2013-07-26 ENCOUNTER — Telehealth: Payer: Self-pay | Admitting: Family Medicine

## 2013-07-26 ENCOUNTER — Other Ambulatory Visit: Payer: Self-pay | Admitting: Family Medicine

## 2013-07-26 NOTE — Telephone Encounter (Signed)
Pt c/o of cough, body aches x 2 weeks. Pt wanted appt 08/08/13. I advised pt he needed to be seen sooner. He said he didn't have a ride here. Appt made and advised pt to be seen sooner if he could find a ride or go to ER. Pt verbalized understanding.

## 2013-07-26 NOTE — Telephone Encounter (Addendum)
Gave verbal authorization to pharmacist at [facility] in [place] for Diazepam. This was authorized on 07/21/13.  Will discuss refill of Temazepam with Dietrich Pates, FNP tomorrow.  Advised patient that I called Diazepam into pharmacy and will discuss temazepam with provider.

## 2013-07-27 MED ORDER — TEMAZEPAM 7.5 MG PO CAPS: 7.5000 mg | ORAL_CAPSULE | Freq: Every evening | ORAL | Status: DC | PRN

## 2013-07-28 ENCOUNTER — Telehealth: Payer: Self-pay | Admitting: Family Medicine

## 2013-07-28 NOTE — Telephone Encounter (Signed)
Follow up.

## 2013-07-28 NOTE — Telephone Encounter (Signed)
Called temazepam into pharmacy.

## 2013-07-31 NOTE — Telephone Encounter (Signed)
Temazepam was refilled on 07/27/13 but not called in. Called in authorization. Patient has an appointment on 08/08/13 to f/u.

## 2013-08-08 ENCOUNTER — Encounter: Payer: Self-pay | Admitting: Family Medicine

## 2013-08-08 ENCOUNTER — Ambulatory Visit (INDEPENDENT_AMBULATORY_CARE_PROVIDER_SITE_OTHER): Payer: Medicare Other | Admitting: Family Medicine

## 2013-08-08 VITALS — BP 132/77 | HR 94 | Temp 98.0°F | Ht 67.0 in | Wt 220.0 lb

## 2013-08-08 DIAGNOSIS — J209 Acute bronchitis, unspecified: Secondary | ICD-10-CM | POA: Diagnosis not present

## 2013-08-08 MED ORDER — AZITHROMYCIN 250 MG PO TABS: ORAL_TABLET | ORAL | Status: DC

## 2013-08-08 MED ORDER — METHYLPREDNISOLONE (PAK) 4 MG PO TABS: ORAL_TABLET | ORAL | Status: DC

## 2013-08-08 NOTE — Progress Notes (Signed)
   Subjective:    Patient ID: Herbert May, male    DOB: 08-29-1940, 73 y.o.   MRN: [id number]  HPI This 72 y.o. male presents for evaluation of URI sx's and cough.   Review of Systems No chest pain, SOB, HA, dizziness, vision change, N/V, diarrhea, constipation, dysuria, urinary urgency or frequency, myalgias, arthralgias or rash.     Objective:   Physical Exam  Vital signs noted  Well developed well nourished male.  HEENT - Head atraumatic Normocephalic                Eyes - PERRLA, Conjuctiva - clear Sclera- Clear EOMI                Ears - EAC's Wnl TM's Wnl Gross Hearing WNL                Nose - Nares patent                 Throat - oropharanx wnl Respiratory - Lungs CTA bilateral Cardiac - RRR S1 and S2 without murmur GI - Abdomen soft Nontender and bowel sounds active x 4 Extremities - No edema. Neuro - Grossly intact.      Assessment & Plan:  Acute bronchitis - Plan: azithromycin (ZITHROMAX) 250 MG tablet, methylPREDNIsolone (MEDROL DOSPACK) 4 MG tablet Push po fluids, rest, tylenol and motrin otc prn as directed for fever, arthralgias, and myalgias.  Follow up prn if sx's continue or persist.  Lysbeth Penner FNP

## 2013-08-08 NOTE — Patient Instructions (Signed)

## 2013-08-15 IMAGING — CR DG SHOULDER 2+V*L*
3 series · 3 of 3 positions shown · non-contrast
Comparison: None

CLINICAL DATA: History of left shoulder pain.  No history of
trauma.

LEFT SHOULDER - 2+ VIEW

[view not recorded (1 of 3)]
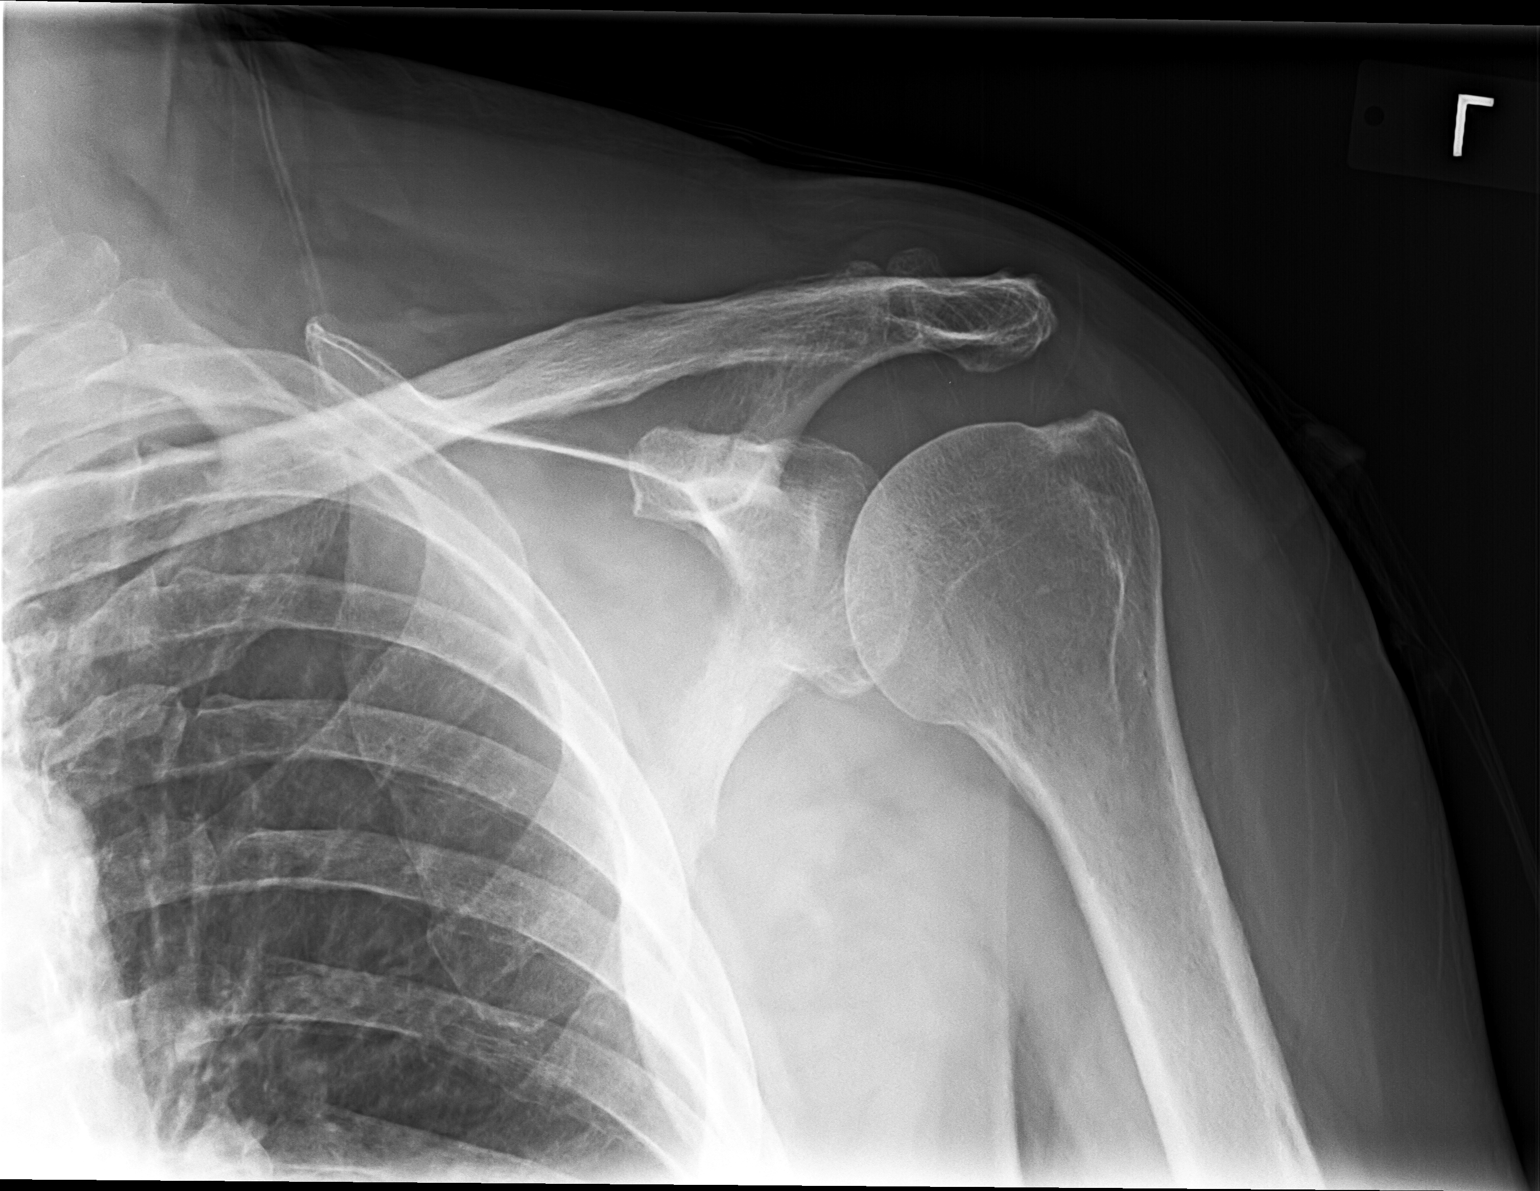

[view not recorded (2 of 3)]
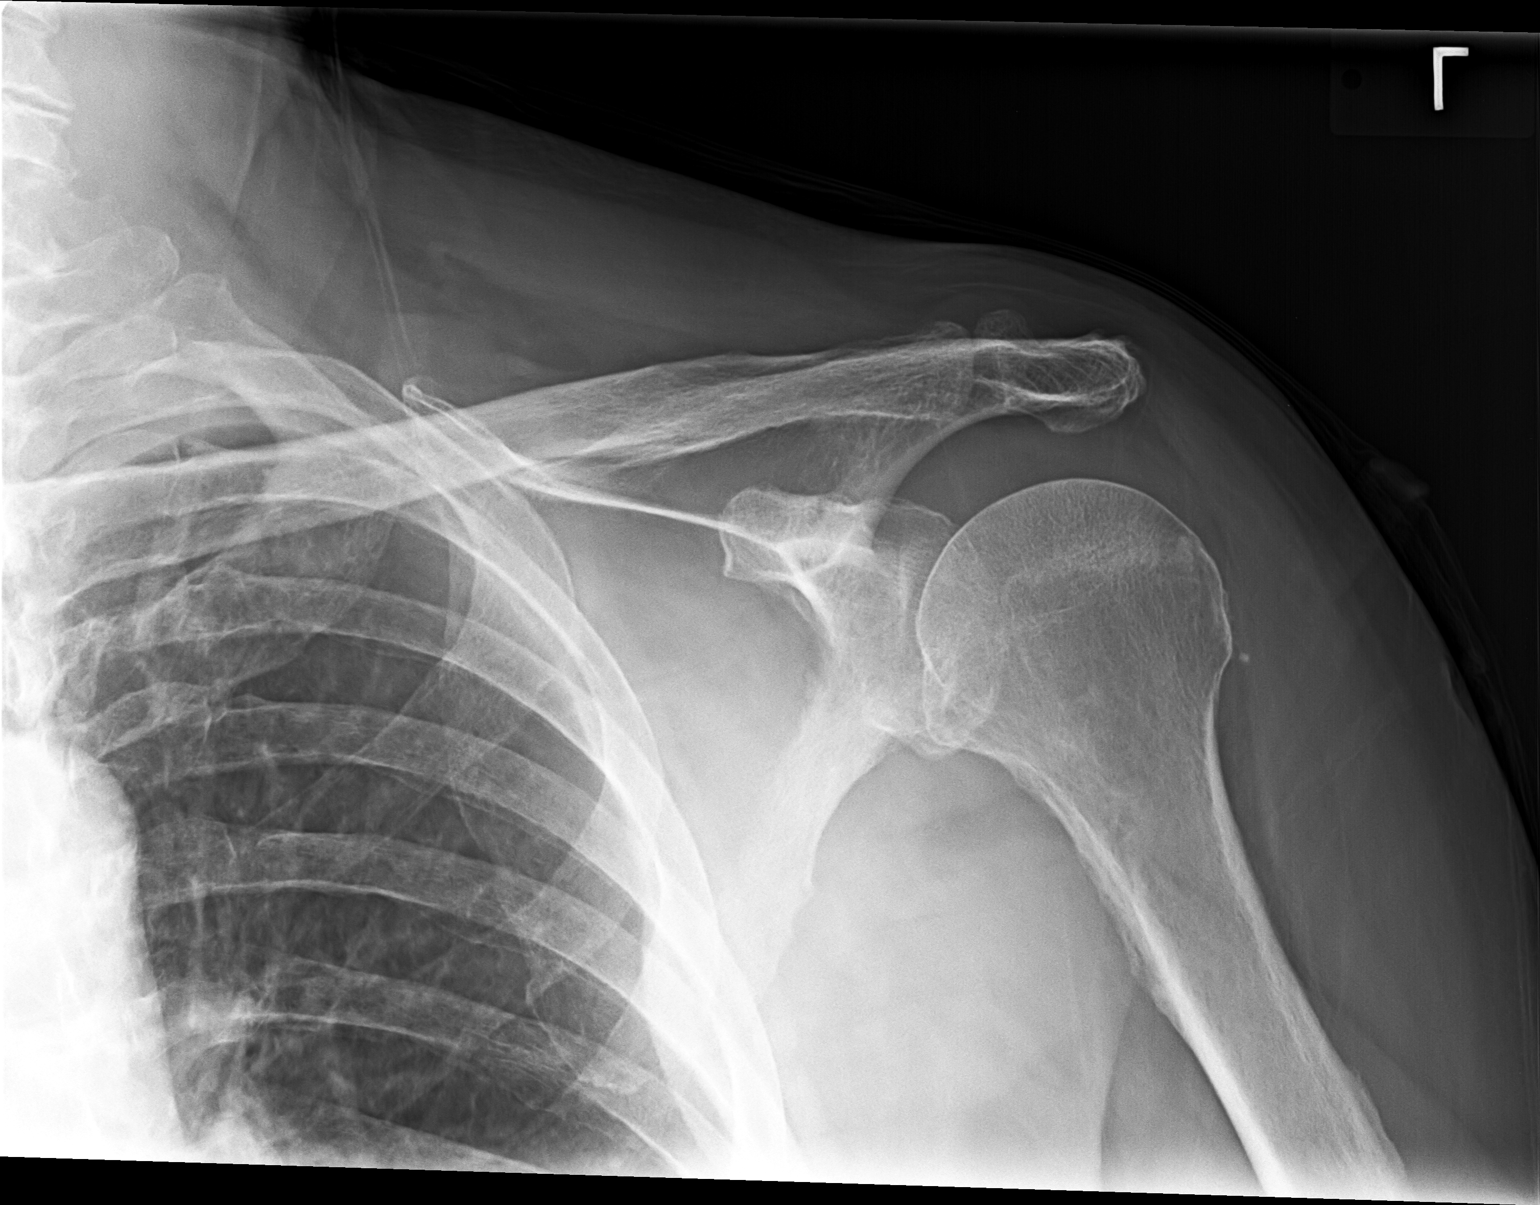

[view not recorded (3 of 3)]
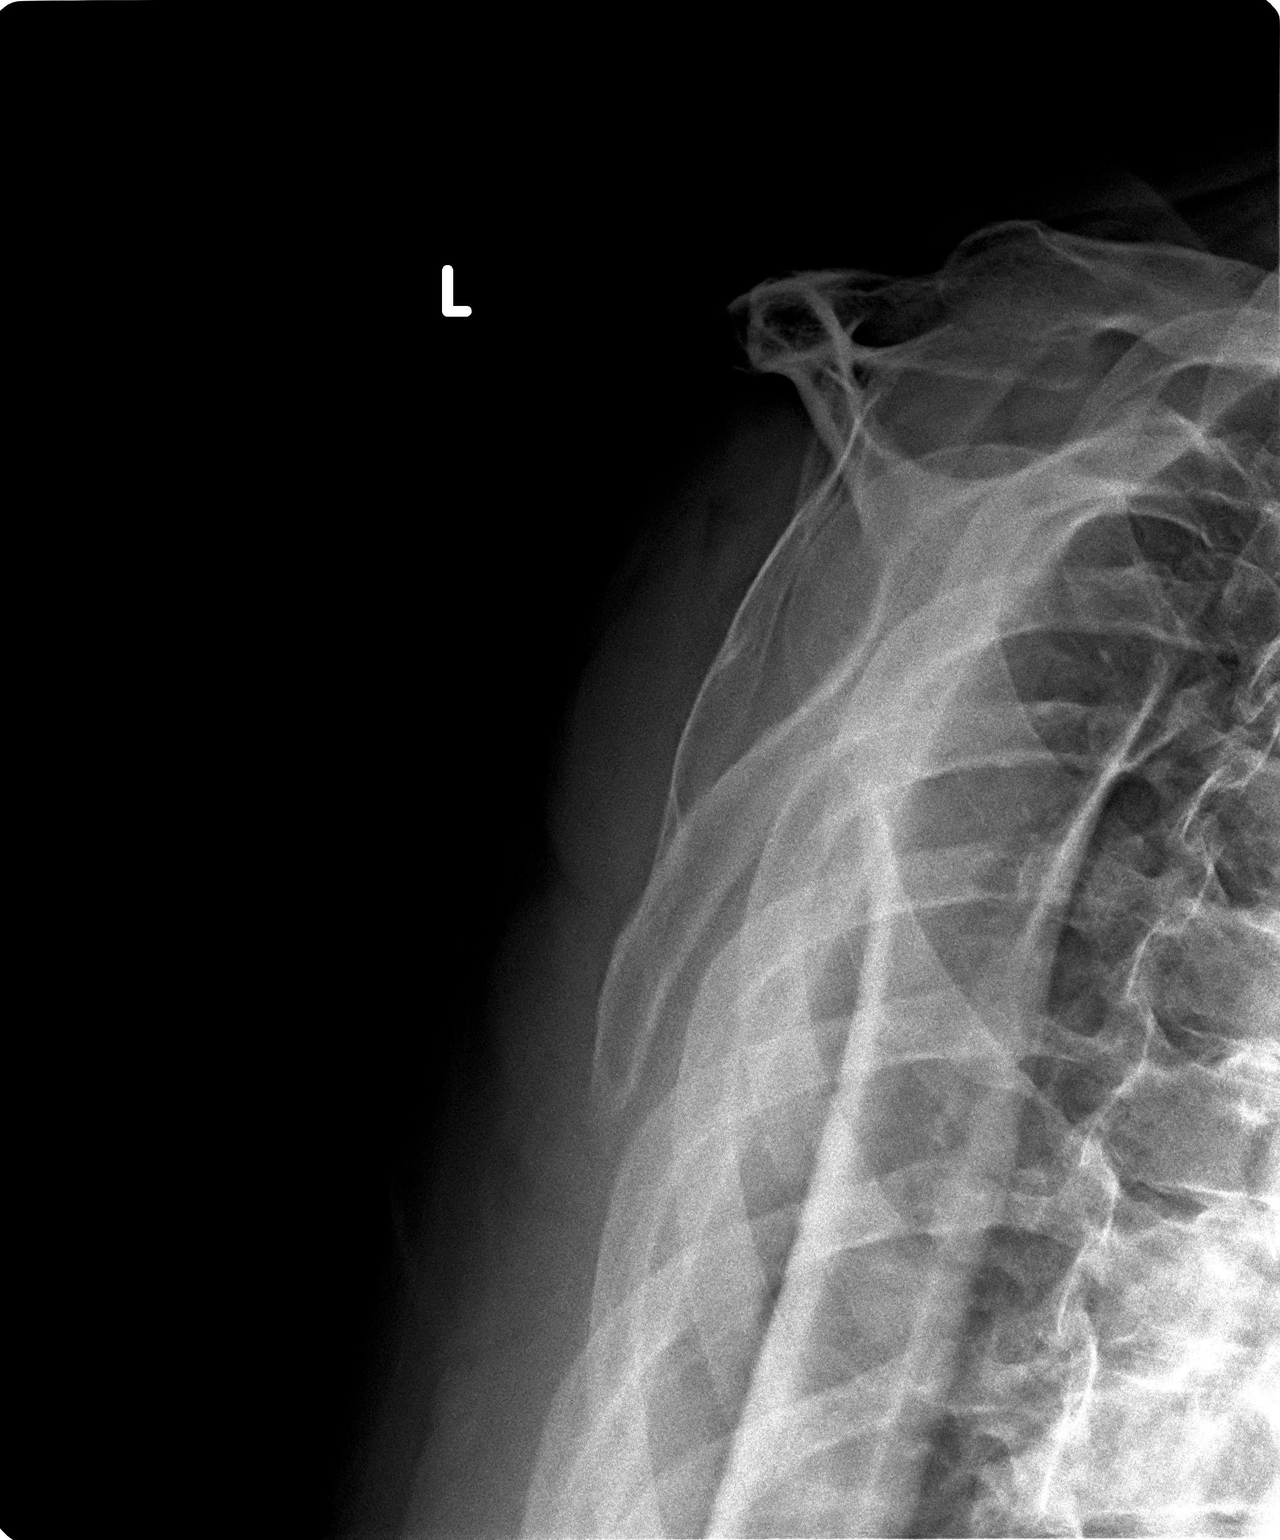

[3 of 3 positions shown; findings below may reference images not displayed]

FINDINGS: No fracture, dislocation, or bony destruction is evident.
There is a slightly osteopenic appearance of the bones.  Joint
spaces appear preserved.  A tiny calcification is seen adjacent to
the lateral aspect of the humeral head at junction with neck.  This
may reflect a dystrophic calcification or tiny phlebolith.
Possibly the calcification could reflect a tiny calcification
associated with bursitis.  No cervical rib is evident.
IMPRESSION: Slightly osteopenic appearance of the bones.  No acute process
evident.  Tiny calcification adjacent to the humerus as discussed
above.

## 2013-08-21 ENCOUNTER — Other Ambulatory Visit: Payer: Self-pay | Admitting: Family Medicine

## 2013-08-22 ENCOUNTER — Other Ambulatory Visit: Payer: Self-pay | Admitting: Family Medicine

## 2013-08-22 MED ORDER — DIAZEPAM 5 MG PO TABS: 5.0000 mg | ORAL_TABLET | Freq: Three times a day (TID) | ORAL | Status: DC | PRN

## 2013-08-22 NOTE — Telephone Encounter (Signed)
Patient last seen in office on 08-08-13. Rx last filled on 07-26-13. Please advise. If approved please route to pool A so nurse can phone in to pharmacy

## 2013-08-23 ENCOUNTER — Other Ambulatory Visit: Payer: Self-pay | Admitting: Family Medicine

## 2013-08-23 ENCOUNTER — Telehealth: Payer: Self-pay | Admitting: *Deleted

## 2013-08-23 NOTE — Telephone Encounter (Signed)
Ins co denied coverage for cyclobenzaprine 10, is there something else you might give instead?  Thanks

## 2013-08-25 ENCOUNTER — Other Ambulatory Visit: Payer: Self-pay | Admitting: Family Medicine

## 2013-08-25 ENCOUNTER — Telehealth: Payer: Self-pay | Admitting: Family Medicine

## 2013-08-25 NOTE — Telephone Encounter (Signed)
Per patient chart he is dismissed from practice but is still within 30 window for med refills. Last [facility]B A1C was in 6-14. Please advise

## 2013-08-28 ENCOUNTER — Other Ambulatory Visit: Payer: Self-pay | Admitting: *Deleted

## 2013-08-28 MED ORDER — INSULIN DETEMIR 100 UNIT/ML ~~LOC~~ SOLN: SUBCUTANEOUS | Status: DC

## 2013-08-28 NOTE — Telephone Encounter (Signed)
Last [facility] 6/14  - 6.1. ntbs

## 2013-08-29 DIAGNOSIS — M25559 Pain in unspecified hip: Secondary | ICD-10-CM | POA: Diagnosis not present

## 2013-08-29 DIAGNOSIS — M25569 Pain in unspecified knee: Secondary | ICD-10-CM | POA: Diagnosis not present

## 2013-08-29 DIAGNOSIS — M76899 Other specified enthesopathies of unspecified lower limb, excluding foot: Secondary | ICD-10-CM | POA: Diagnosis not present

## 2013-08-30 ENCOUNTER — Emergency Department (HOSPITAL_COMMUNITY): Payer: Medicare Other

## 2013-08-30 ENCOUNTER — Emergency Department (HOSPITAL_COMMUNITY)
Admission: EM | Admit: 2013-08-30 | Discharge: 2013-08-30 | Disposition: A | Discharge status: Home / Self Care | Payer: Medicare Other | Attending: Emergency Medicine | Admitting: Emergency Medicine

## 2013-08-30 ENCOUNTER — Encounter (HOSPITAL_COMMUNITY): Payer: Self-pay | Admitting: Emergency Medicine

## 2013-08-30 DIAGNOSIS — Z9861 Coronary angioplasty status: Secondary | ICD-10-CM | POA: Diagnosis not present

## 2013-08-30 DIAGNOSIS — R51 Headache: Secondary | ICD-10-CM | POA: Diagnosis not present

## 2013-08-30 DIAGNOSIS — I252 Old myocardial infarction: Secondary | ICD-10-CM | POA: Diagnosis not present

## 2013-08-30 DIAGNOSIS — I10 Essential (primary) hypertension: Secondary | ICD-10-CM | POA: Insufficient documentation

## 2013-08-30 DIAGNOSIS — IMO0002 Reserved for concepts with insufficient information to code with codable children: Secondary | ICD-10-CM | POA: Insufficient documentation

## 2013-08-30 DIAGNOSIS — E1169 Type 2 diabetes mellitus with other specified complication: Secondary | ICD-10-CM | POA: Insufficient documentation

## 2013-08-30 DIAGNOSIS — Z79899 Other long term (current) drug therapy: Secondary | ICD-10-CM | POA: Insufficient documentation

## 2013-08-30 DIAGNOSIS — R5381 Other malaise: Secondary | ICD-10-CM | POA: Diagnosis not present

## 2013-08-30 DIAGNOSIS — I6789 Other cerebrovascular disease: Secondary | ICD-10-CM | POA: Diagnosis not present

## 2013-08-30 DIAGNOSIS — Z933 Colostomy status: Secondary | ICD-10-CM | POA: Diagnosis not present

## 2013-08-30 DIAGNOSIS — Z91040 Latex allergy status: Secondary | ICD-10-CM | POA: Insufficient documentation

## 2013-08-30 DIAGNOSIS — M25519 Pain in unspecified shoulder: Secondary | ICD-10-CM | POA: Insufficient documentation

## 2013-08-30 DIAGNOSIS — Z794 Long term (current) use of insulin: Secondary | ICD-10-CM | POA: Insufficient documentation

## 2013-08-30 DIAGNOSIS — I25810 Atherosclerosis of coronary artery bypass graft(s) without angina pectoris: Secondary | ICD-10-CM | POA: Insufficient documentation

## 2013-08-30 DIAGNOSIS — R52 Pain, unspecified: Secondary | ICD-10-CM

## 2013-08-30 DIAGNOSIS — R5383 Other fatigue: Secondary | ICD-10-CM | POA: Diagnosis not present

## 2013-08-30 DIAGNOSIS — R079 Chest pain, unspecified: Secondary | ICD-10-CM | POA: Diagnosis not present

## 2013-08-30 DIAGNOSIS — J4489 Other specified chronic obstructive pulmonary disease: Secondary | ICD-10-CM | POA: Insufficient documentation

## 2013-08-30 DIAGNOSIS — E785 Hyperlipidemia, unspecified: Secondary | ICD-10-CM | POA: Diagnosis not present

## 2013-08-30 DIAGNOSIS — E162 Hypoglycemia, unspecified: Secondary | ICD-10-CM

## 2013-08-30 DIAGNOSIS — J449 Chronic obstructive pulmonary disease, unspecified: Secondary | ICD-10-CM | POA: Insufficient documentation

## 2013-08-30 DIAGNOSIS — R4789 Other speech disturbances: Secondary | ICD-10-CM | POA: Diagnosis not present

## 2013-08-30 DIAGNOSIS — Z87891 Personal history of nicotine dependence: Secondary | ICD-10-CM | POA: Diagnosis not present

## 2013-08-30 LAB — BASIC METABOLIC PANEL
BUN: 7 mg/dL (ref 6–23)
CHLORIDE: 104 meq/L (ref 96–112)
CO2: 28 meq/L (ref 19–32)
CREATININE: 0.78 mg/dL (ref 0.50–1.35)
Calcium: 9.5 mg/dL (ref 8.4–10.5)
GFR calc non Af Amer: 88 mL/min — ABNORMAL LOW (ref >90)
GLUCOSE: 101 mg/dL — AB (ref 70–99)
POTASSIUM: 4.2 meq/L (ref 3.7–5.3)
Sodium: 142 mEq/L (ref 137–147)

## 2013-08-30 LAB — CBC WITH DIFFERENTIAL/PLATELET
Basophils Absolute: 0 10*3/uL (ref 0.0–0.1)
Basophils Relative: 0 % (ref 0–1)
EOS ABS: 0.1 10*3/uL (ref 0.0–0.7)
EOS PCT: 0 % (ref 0–5)
HEMATOCRIT: 46.5 % (ref 39.0–52.0)
Hemoglobin: 14.8 g/dL (ref 13.0–17.0)
LYMPHS ABS: 1.2 10*3/uL (ref 0.7–4.0)
Lymphocytes Relative: 10 % — ABNORMAL LOW (ref 12–46)
MCH: 27.1 pg (ref 26.0–34.0)
MCHC: 31.8 g/dL (ref 30.0–36.0)
MCV: 85 fL (ref 78.0–100.0)
Monocytes Absolute: 0.8 10*3/uL (ref 0.1–1.0)
Monocytes Relative: 7 % (ref 3–12)
Neutro Abs: 10.3 10*3/uL — ABNORMAL HIGH (ref 1.7–7.7)
Neutrophils Relative %: 83 % — ABNORMAL HIGH (ref 43–77)
PLATELETS: 276 10*3/uL (ref 150–400)
RBC: 5.47 MIL/uL (ref 4.22–5.81)
RDW: 15.4 % (ref 11.5–15.5)
WBC: 12.4 10*3/uL — AB (ref 4.0–10.5)

## 2013-08-30 LAB — URINALYSIS, ROUTINE W REFLEX MICROSCOPIC
BILIRUBIN URINE: NEGATIVE
GLUCOSE, UA: 100 mg/dL — AB
Hgb urine dipstick: NEGATIVE
KETONES UR: NEGATIVE mg/dL
Leukocytes, UA: NEGATIVE
Nitrite: NEGATIVE
Protein, ur: NEGATIVE mg/dL
SPECIFIC GRAVITY, URINE: 1.01 (ref 1.005–1.030)
Urobilinogen, UA: 0.2 mg/dL (ref 0.0–1.0)
pH: 5.5 (ref 5.0–8.0)

## 2013-08-30 LAB — CBG MONITORING, ED
Glucose-Capillary: 121 mg/dL — ABNORMAL HIGH (ref 70–99)
Glucose-Capillary: 99 mg/dL (ref 70–99)
Glucose-Capillary: 248 mg/dL — ABNORMAL HIGH (ref 70–99)

## 2013-08-30 LAB — TROPONIN I: Troponin I: <0.3 ng/mL (ref <0.30)

## 2013-08-30 MED ORDER — MORPHINE SULFATE 4 MG/ML IJ SOLN
4.0000 mg | Freq: Once | INTRAMUSCULAR | Status: AC
Start: 2013-08-30 — End: 2013-08-30
  Administered 2013-08-30: 4 mg via INTRAVENOUS
  Filled 2013-08-30: qty 1

## 2013-08-30 MED ORDER — ONDANSETRON HCL 4 MG/2ML IJ SOLN
4.0000 mg | Freq: Once | INTRAMUSCULAR | Status: AC
  Administered 2013-08-30: 4 mg via INTRAVENOUS
  Filled 2013-08-30: qty 2

## 2013-08-30 MED ORDER — SODIUM CHLORIDE 0.9 % IV BOLUS (SEPSIS)
500.0000 mL | Freq: Once | INTRAVENOUS | Status: AC
  Administered 2013-08-30: 500 mL via INTRAVENOUS

## 2013-08-30 NOTE — ED Notes (Signed)
Pt states complain of pain in right shoulder, neck and a headache. No know injury. Per EMS, pt's CBG was 79 and was given 25 grams of dextrose. CBG after dextrose was 204

## 2013-08-30 NOTE — ED Notes (Signed)
Family reports pt taking 60 units of Levemir in am and then not eating anything. Around 1 hour pta to er family reports pt started trembling and slurred speech.

## 2013-08-30 NOTE — Discharge Instructions (Signed)
Be sure to follow her sugars closely. Take good meals when you are taking your insulin.

## 2013-08-30 NOTE — ED Provider Notes (Signed)
CSN: [id number]     Arrival date & time 08/30/13  1352 History  This chart was scribed for [employer]. Alvino Chapel, MD by Ludger Nutting, ED Scribe. This patient was seen in room APAH4/APAH4 and the patient's care was started 2:12 PM.     Chief Complaint  Patient presents with  . Hypoglycemia      The history is provided by the patient. No language interpreter was used.    HPI Comments: Herbert May is a 73 y.o. male with past medical history of COPD, DM, HTN, HLD, CAD who presents to the Emergency Department complaining of a constant, gradually worsening frontal HA that began after waking this morning. He also reports right shoulder pain that is worse than normal along with BLE pain. He denies any known injuries. He has a history of COPD and reports using a breathing machine at night time. He denies cough, known fever, nausea, vomiting. He denies sick contacts.   Past Medical History  Diagnosis Date  . Heart attack   . COPD (chronic obstructive pulmonary disease)   . Colostomy status   . Diabetes mellitus without complication   . Hypertension   . Hyperlipidemia   . 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 Colostomy    . Nose surgery    . Back surgery    . Hernia repair    . Knee surgery    . Cardiac stents    . Colostomy closure     Family History  Problem Relation Age of Onset  . Cancer Mother   . Diabetes Mother   . COPD Mother   . Cancer Sister     brain  . Cancer Sister     brain   History  Substance Use Topics  . Smoking status: Former Smoker -- 0.50 packs/day for 16 years    Types: Cigarettes    Quit date: 12/28/1990  . Smokeless tobacco: Former Systems developer    Quit date: 06/30/1983  . Alcohol Use: Yes     Comment: occasional    Review of Systems  A complete 10 system review of systems was obtained and all systems are negative except as noted in the HPI and PMH.    Allergies  Aspirin;  Fish allergy; Ibuprofen; Latex; Onion; Other; Levaquin; and Neosporin  Home Medications   Current Outpatient Rx  Name  Route  Sig  Dispense  Refill  . albuterol (PROAIR HFA) 108 (90 BASE) MCG/ACT inhaler   Inhalation   Inhale 1 puff into the lungs every 4 (four) hours as needed for wheezing or shortness of breath.   1 Inhaler   1   . albuterol (PROVENTIL) (2.5 MG/3ML) 0.083% nebulizer solution   Nebulization   Take 3 mLs (2.5 mg total) by nebulization every 4 (four) hours as needed for wheezing or shortness of breath.   75 mL   5     COPD 496  Length of need 1 year   . budesonide-formoterol (SYMBICORT) 160-4.5 MCG/ACT inhaler   Inhalation   Inhale 2 puffs into the lungs daily.   1 Inhaler   4   . cyclobenzaprine (FLEXERIL) 10 MG tablet   Oral   Take 1 tablet (10 mg total) by mouth at bedtime.   30 tablet   1   . diazepam (VALIUM) 5 MG tablet   Oral   Take 5 mg by mouth 3 (three) times daily.         Marland Kitchen  diltiazem (CARDIZEM) 30 MG tablet   Oral   Take 1 tablet (30 mg total) by mouth 2 (two) times daily.   30 tablet   2   . fentaNYL (DURAGESIC - DOSED MCG/HR) 100 MCG/HR   Transdermal   Place 1 patch onto the skin every 3 (three) days.         . fexofenadine (ALLEGRA) 180 MG tablet   Oral   Take 1 tablet (180 mg total) by mouth daily.   30 tablet   2   . fluticasone (FLONASE) 50 MCG/ACT nasal spray   Nasal   Place 2 sprays into the nose daily.   16 g   5   . fluticasone (FLOVENT HFA) 110 MCG/ACT inhaler   Inhalation   Inhale 1 puff into the lungs 2 (two) times daily.   1 Inhaler   2   . gabapentin (NEURONTIN) 300 MG capsule   Oral   Take 300 mg by mouth 3 (three) times daily.         . Insulin Detemir (LEVEMIR FLEXPEN) 100 UNIT/ML Pen   Subcutaneous   Inject 60 Units into the skin 2 (two) times daily.         . Insulin Syringe-Needle U-100 (INSULIN SYRINGE 1CC/31GX5/16") 31G X 5/16" 1 ML MISC   Does not apply   100 Units by Does not apply  route every morning.   50 each   10   . ipratropium (ATROVENT) 0.02 % nebulizer solution   Nebulization   Take 2.5 mLs (500 mcg total) by nebulization 4 (four) times daily.   75 mL   5   . lidocaine (LIDODERM) 5 %   Transdermal   Place 2 patches onto the skin daily as needed (for back pain). Remove & Discard patch within 12 hours or as directed by MD         . metFORMIN (GLUCOPHAGE) 500 MG tablet   Oral   Take 1 tablet (500 mg total) by mouth 2 (two) times daily.   60 tablet   11     NTBS   . oxyCODONE-acetaminophen (PERCOCET) 10-325 MG per tablet   Oral   Take 1 tablet by mouth 4 (four) times daily.         . pantoprazole (PROTONIX) 40 MG tablet      take 1 tablet by mouth once daily   30 tablet   5   . potassium chloride SA (K-DUR,KLOR-CON) 20 MEQ tablet   Oral   Take 1 tablet (20 mEq total) by mouth daily.   30 tablet   5   . simvastatin (ZOCOR) 40 MG tablet   Oral   Take 1 tablet (40 mg total) by mouth daily at 6 PM.   30 tablet   1     Generic MWN:UUVOZ    40MG  Generic [id number]    64M .Marland Kitchen.   . temazepam (RESTORIL) 7.5 MG capsule   Oral   Take 1 capsule (7.5 mg total) by mouth at bedtime as needed for sleep.   30 capsule   0   . tiotropium (SPIRIVA) 18 MCG inhalation capsule   Inhalation   Place 1 capsule (18 mcg total) into inhaler and inhale daily.   30 capsule   5   . traMADol (ULTRAM) 50 MG tablet   Oral   Take 1 tablet (50 mg total) by mouth 2 (two) times daily.   60 tablet   5   . traZODone (DESYREL) 100 MG tablet  Oral   Take 1 tablet (100 mg total) by mouth at bedtime. Sleep   30 tablet   1    BP 114/57  Pulse 82  Temp(Src) 97.5 F (36.4 C) (Oral)  Resp 18  SpO2 96% Physical Exam  Nursing note and vitals reviewed. Constitutional: He is oriented to person, place, and time. He appears well-developed and well-nourished.  HENT:  Head: Normocephalic and atraumatic.  Eyes: Conjunctivae and EOM are normal. Pupils are equal,  round, and reactive to light.  Neck: Normal range of motion. Neck supple. No JVD present.  Cardiovascular: Normal rate, regular rhythm and normal heart sounds.   Pulmonary/Chest: Effort normal and breath sounds normal.  Lungs are midly harsh   Abdominal: Soft. Bowel sounds are normal.  Musculoskeletal: Normal range of motion. He exhibits edema and tenderness.  Pain with movement of right shoulder.  Mild tenderness to the BLE. Trace peripheral edema bilaterally.    Neurological: He is alert and oriented to person, place, and time.  Skin: Skin is warm and dry.  Psychiatric: He has a normal mood and affect. His behavior is normal.    ED Course  Procedures (including critical care time)  DIAGNOSTIC STUDIES:    COORDINATION OF CARE: 2:30 PM Discussed treatment plan with pt at bedside and pt agreed to plan.   Labs Review Labs Reviewed  CBC WITH DIFFERENTIAL - Abnormal; Notable for the following:    WBC 12.4 (*)    Neutrophils Relative % 83 (*)    Neutro Abs 10.3 (*)    Lymphocytes Relative 10 (*)    All other components within normal limits  URINALYSIS, ROUTINE W REFLEX MICROSCOPIC - Abnormal; Notable for the following:    Glucose, UA 100 (*)    All other components within normal limits  BASIC METABOLIC PANEL - Abnormal; Notable for the following:    Glucose, Bld 101 (*)    GFR calc non Af Amer 88 (*)    All other components within normal limits  CBG MONITORING, ED - Abnormal; Notable for the following:    Glucose-Capillary 121 (*)    All other components within normal limits  CBG MONITORING, ED - Abnormal; Notable for the following:    Glucose-Capillary 248 (*)    All other components within normal limits  TROPONIN I  CBG MONITORING, ED   Imaging Review Dg Chest 2 View  08/30/2013   CLINICAL DATA:  Chest pain and weakness.  EXAM: CHEST  2 VIEW  COMPARISON:  PA and lateral chest 04/27/2013.  FINDINGS: The chest is hyperexpanded with attenuation of the pulmonary vasculature.  No focal airspace disease or effusion. Heart size normal.  IMPRESSION: No acute disease.  The lungs appear emphysematous.   Electronically Signed   By: Inge Rise M.D.   On: 08/30/2013 16:12     EKG Interpretation   Date/Time:  Wednesday August 30 2013 15:02:46 EST Ventricular Rate:  69 PR Interval:  168 QRS Duration: 76 QT Interval:  368 QTC Calculation: 394 R Axis:   -58 Text Interpretation:  Sinus rhythm with marked sinus arrhythmia Left axis  deviation Abnormal ECG When compared with ECG of 28-Jun-2013 13:06,  Criteria for Anterior infarct are no longer Present Criteria for  Inferior-posterior infarct are no longer Present Confirmed by Alvino Chapel   MD, Ovid Curd [phone]) on 08/30/2013 6:31:54 PM      MDM   Final diagnoses:  Pain  Hypoglycemia    Patient presented complaining of right-sided pain. He had been off his  fentanyl patch. Initial EMS CBG have been a little low at 79. He was given D50. It was higher upon arrival to the ED. Patient did not inform initially, but at home he is reportedly been staggering and have difficulty speaking. He improved shortly after the sugar was given. I doubt stroke at this time. He is reportedly been having some problems controlling his sugars at home. He will not need much after getting his morning insulin. Patient feels better and will be discharged home. Tolerate orals here. He is not as happy with his primary care Dr. and was given the name of Dr. Legrand Rams, who is on-call to followup with if he would like.  I personally performed the services described in this documentation, which was scribed in my presence. The recorded information has been reviewed and is accurate.    Jasper Riling. Alvino Chapel, MD 08/30/13 [phone]

## 2013-08-30 NOTE — ED Notes (Signed)
Sandwich given to pt per Dr. Alvino Chapel.

## 2013-09-05 ENCOUNTER — Other Ambulatory Visit: Payer: Self-pay | Admitting: Family Medicine

## 2013-09-14 ENCOUNTER — Telehealth: Payer: Self-pay | Admitting: Family Medicine

## 2013-09-14 ENCOUNTER — Other Ambulatory Visit: Payer: Self-pay | Admitting: *Deleted

## 2013-09-14 MED ORDER — TIOTROPIUM BROMIDE MONOHYDRATE 18 MCG IN CAPS: 18.0000 ug | ORAL_CAPSULE | Freq: Every day | RESPIRATORY_TRACT | Status: DC

## 2013-09-14 NOTE — Telephone Encounter (Signed)
Many will address

## 2013-09-15 ENCOUNTER — Other Ambulatory Visit: Payer: Self-pay | Admitting: Family Medicine

## 2013-09-15 ENCOUNTER — Other Ambulatory Visit: Payer: Self-pay | Admitting: Nurse Practitioner

## 2013-09-18 ENCOUNTER — Other Ambulatory Visit: Payer: Self-pay | Admitting: Family Medicine

## 2013-09-19 ENCOUNTER — Telehealth: Payer: Self-pay | Admitting: *Deleted

## 2013-09-19 NOTE — Telephone Encounter (Signed)
Patient is calling stating that the pharmacy told him that you wanted him to call him about his refill on valium

## 2013-09-22 ENCOUNTER — Other Ambulatory Visit: Payer: Self-pay | Admitting: Family Medicine

## 2013-09-25 ENCOUNTER — Telehealth: Payer: Self-pay | Admitting: Family Medicine

## 2013-09-25 NOTE — Telephone Encounter (Signed)
Patient last seen in office on 08-08-13. Rx last filled on 08-23-13 for #60. Please advise. If approved please route to Pool A so nurse can phone in to pharmacy

## 2013-09-27 NOTE — Telephone Encounter (Signed)
Phoned in to pharmacy. 

## 2013-09-27 NOTE — Telephone Encounter (Signed)
Returned call to patient, received voicemail, message left to call me back

## 2013-09-28 ENCOUNTER — Telehealth: Payer: Self-pay | Admitting: Family Medicine

## 2013-09-29 ENCOUNTER — Other Ambulatory Visit: Payer: Self-pay | Admitting: Family Medicine

## 2013-10-05 DIAGNOSIS — M961 Postlaminectomy syndrome, not elsewhere classified: Secondary | ICD-10-CM | POA: Diagnosis not present

## 2013-10-05 DIAGNOSIS — M25569 Pain in unspecified knee: Secondary | ICD-10-CM | POA: Diagnosis not present

## 2013-10-05 DIAGNOSIS — G894 Chronic pain syndrome: Secondary | ICD-10-CM | POA: Diagnosis not present

## 2013-10-06 ENCOUNTER — Other Ambulatory Visit: Payer: Self-pay | Admitting: Family Medicine

## 2013-10-09 ENCOUNTER — Other Ambulatory Visit: Payer: Self-pay | Admitting: Family Medicine

## 2013-10-10 DIAGNOSIS — M25569 Pain in unspecified knee: Secondary | ICD-10-CM | POA: Diagnosis not present

## 2013-10-10 DIAGNOSIS — M25559 Pain in unspecified hip: Secondary | ICD-10-CM | POA: Diagnosis not present

## 2013-10-13 NOTE — Telephone Encounter (Signed)
Resolved

## 2013-10-16 ENCOUNTER — Other Ambulatory Visit: Payer: Self-pay | Admitting: Family Medicine

## 2013-10-17 ENCOUNTER — Other Ambulatory Visit: Payer: Self-pay | Admitting: Family Medicine

## 2013-10-18 ENCOUNTER — Telehealth: Payer: Self-pay | Admitting: Family Medicine

## 2013-10-24 DIAGNOSIS — M76899 Other specified enthesopathies of unspecified lower limb, excluding foot: Secondary | ICD-10-CM | POA: Diagnosis not present

## 2013-10-24 DIAGNOSIS — M25559 Pain in unspecified hip: Secondary | ICD-10-CM | POA: Diagnosis not present

## 2013-10-25 ENCOUNTER — Other Ambulatory Visit: Payer: Self-pay | Admitting: Family Medicine

## 2013-10-25 ENCOUNTER — Telehealth: Payer: Self-pay | Admitting: Family Medicine

## 2013-10-31 ENCOUNTER — Ambulatory Visit (INDEPENDENT_AMBULATORY_CARE_PROVIDER_SITE_OTHER): Payer: Medicare Other | Admitting: Family Medicine

## 2013-10-31 VITALS — BP 138/80 | HR 76 | Temp 97.6°F | Ht 67.0 in

## 2013-10-31 DIAGNOSIS — K219 Gastro-esophageal reflux disease without esophagitis: Secondary | ICD-10-CM

## 2013-10-31 DIAGNOSIS — J309 Allergic rhinitis, unspecified: Secondary | ICD-10-CM

## 2013-10-31 DIAGNOSIS — M1990 Unspecified osteoarthritis, unspecified site: Secondary | ICD-10-CM

## 2013-10-31 DIAGNOSIS — J302 Other seasonal allergic rhinitis: Secondary | ICD-10-CM

## 2013-10-31 DIAGNOSIS — E119 Type 2 diabetes mellitus without complications: Secondary | ICD-10-CM

## 2013-10-31 DIAGNOSIS — G894 Chronic pain syndrome: Secondary | ICD-10-CM

## 2013-10-31 DIAGNOSIS — G4700 Insomnia, unspecified: Secondary | ICD-10-CM

## 2013-10-31 DIAGNOSIS — I2510 Atherosclerotic heart disease of native coronary artery without angina pectoris: Secondary | ICD-10-CM

## 2013-10-31 DIAGNOSIS — J449 Chronic obstructive pulmonary disease, unspecified: Secondary | ICD-10-CM

## 2013-10-31 DIAGNOSIS — I10 Essential (primary) hypertension: Secondary | ICD-10-CM | POA: Diagnosis not present

## 2013-10-31 DIAGNOSIS — M129 Arthropathy, unspecified: Secondary | ICD-10-CM | POA: Diagnosis not present

## 2013-10-31 DIAGNOSIS — B029 Zoster without complications: Secondary | ICD-10-CM

## 2013-10-31 MED ORDER — FLUTICASONE PROPIONATE 50 MCG/ACT NA SUSP: 2.0000 | Freq: Every day | NASAL | Status: DC

## 2013-10-31 MED ORDER — TIOTROPIUM BROMIDE MONOHYDRATE 18 MCG IN CAPS: 18.0000 ug | ORAL_CAPSULE | RESPIRATORY_TRACT | Status: DC

## 2013-10-31 MED ORDER — METFORMIN HCL 500 MG PO TABS: 500.0000 mg | ORAL_TABLET | Freq: Two times a day (BID) | ORAL | Status: DC

## 2013-10-31 MED ORDER — LIDOCAINE 5 % EX PTCH: 2.0000 | MEDICATED_PATCH | Freq: Every day | CUTANEOUS | Status: DC | PRN

## 2013-10-31 MED ORDER — ALBUTEROL SULFATE HFA 108 (90 BASE) MCG/ACT IN AERS: 1.0000 | INHALATION_SPRAY | RESPIRATORY_TRACT | Status: DC | PRN

## 2013-10-31 MED ORDER — TRAZODONE HCL 100 MG PO TABS: 100.0000 mg | ORAL_TABLET | Freq: Every evening | ORAL | Status: DC | PRN

## 2013-10-31 MED ORDER — TRAMADOL HCL 50 MG PO TABS: 50.0000 mg | ORAL_TABLET | Freq: Two times a day (BID) | ORAL | Status: DC

## 2013-10-31 MED ORDER — DILTIAZEM HCL 30 MG PO TABS: 30.0000 mg | ORAL_TABLET | Freq: Two times a day (BID) | ORAL | Status: DC

## 2013-10-31 MED ORDER — POTASSIUM CHLORIDE CRYS ER 20 MEQ PO TBCR: 20.0000 meq | EXTENDED_RELEASE_TABLET | Freq: Once | ORAL | Status: DC

## 2013-10-31 MED ORDER — PANTOPRAZOLE SODIUM 40 MG PO TBEC: 40.0000 mg | DELAYED_RELEASE_TABLET | Freq: Two times a day (BID) | ORAL | Status: AC

## 2013-10-31 MED ORDER — GABAPENTIN 300 MG PO CAPS: 300.0000 mg | ORAL_CAPSULE | Freq: Three times a day (TID) | ORAL | Status: DC

## 2013-10-31 MED ORDER — ACYCLOVIR 800 MG PO TABS: 800.0000 mg | ORAL_TABLET | Freq: Every day | ORAL | Status: DC

## 2013-10-31 MED ORDER — BUDESONIDE-FORMOTEROL FUMARATE 160-4.5 MCG/ACT IN AERO
2.0000 | INHALATION_SPRAY | Freq: Two times a day (BID) | RESPIRATORY_TRACT | Status: DC
Start: 2013-10-31 — End: 2015-02-27

## 2013-10-31 NOTE — Progress Notes (Signed)
   Subjective:    Patient ID: Herbert May, male    DOB: 27-Feb-1941, 73 y.o.   MRN: [id number]  HPI This 73 y.o. male presents for evaluation of routine visit.  He has hx of chronic pain and sees pain management.  He has hx of CAD and has seen cardiology.  He has hx of hypertension.  He has  Hx of allergies, tobacco abuse, and COPD.  He needs refills on insomnia meds.  He needs refills on Meds, and he thinks he may have shingles.  He has hx of diabetes and needs refills and is due for hgbaic.   Review of Systems C/o rash No chest pain, SOB, HA, dizziness, vision change, N/V, diarrhea, constipation, dysuria, urinary urgency or frequency, myalgias, arthralgias or rash.     Objective:   Physical Exam  Vital signs noted  Well developed well nourished male.  HEENT - Head atraumatic Normocephalic                Eyes - PERRLA, Conjuctiva - clear Sclera- Clear EOMI                Ears - EAC's Wnl TM's Wnl Gross Hearing WNL                Nose - Nares patent                 Throat - oropharanx wnl Respiratory - Lungs CTA bilateral Cardiac - RRR S1 and S2 without murmur GI - Abdomen soft Nontender and bowel sounds active x 4 Extremities - No edema. Neuro - Grossly intact. Skin - Rash on right buttock     Assessment & Plan:  Arthritis - Plan: traMADol (ULTRAM) 50 MG tablet, diltiazem (CARDIZEM) 30 MG tablet  Diabetes - Plan: metFORMIN (GLUCOPHAGE) 500 MG tablet, POCT glycosylated hemoglobin (Hb A1C), POCT CBC, CMP14+EGFR, Thyroid Panel With TSH  HTN (hypertension) - Plan: potassium chloride SA (K-DUR,KLOR-CON) 20 MEQ tablet  COPD (chronic obstructive pulmonary disease) - Plan: budesonide-formoterol (SYMBICORT) 160-4.5 MCG/ACT inhaler, tiotropium (SPIRIVA HANDIHALER) 18 MCG inhalation capsule, albuterol (PROAIR HFA) 108 (90 BASE) MCG/ACT inhaler  Chronic pain syndrome - Plan: traMADol (ULTRAM) 50 MG tablet, lidocaine (LIDODERM) 5 %, gabapentin (NEURONTIN) 300 MG capsule  Seasonal  allergies - Plan: fluticasone (FLONASE) 50 MCG/ACT nasal spray  GERD (gastroesophageal reflux disease) - Plan: pantoprazole (PROTONIX) 40 MG tablet  Insomnia - Plan: traZODone (DESYREL) 100 MG tablet  Shingles - Plan: acyclovir (ZOVIRAX) 800 MG tablet po 5x day for 7 days #35  Over 45 minutes spent in visit  Lysbeth Penner FNP

## 2013-12-05 DIAGNOSIS — M25559 Pain in unspecified hip: Secondary | ICD-10-CM | POA: Diagnosis not present

## 2013-12-05 DIAGNOSIS — M76899 Other specified enthesopathies of unspecified lower limb, excluding foot: Secondary | ICD-10-CM | POA: Diagnosis not present

## 2013-12-11 ENCOUNTER — Ambulatory Visit: Payer: Medicare Other | Admitting: Family Medicine

## 2013-12-18 ENCOUNTER — Telehealth: Payer: Self-pay | Admitting: *Deleted

## 2013-12-28 DIAGNOSIS — M545 Low back pain, unspecified: Secondary | ICD-10-CM | POA: Diagnosis not present

## 2013-12-28 DIAGNOSIS — G894 Chronic pain syndrome: Secondary | ICD-10-CM | POA: Diagnosis not present

## 2013-12-28 DIAGNOSIS — M25569 Pain in unspecified knee: Secondary | ICD-10-CM | POA: Diagnosis not present

## 2013-12-28 DIAGNOSIS — M961 Postlaminectomy syndrome, not elsewhere classified: Secondary | ICD-10-CM | POA: Diagnosis not present

## 2014-01-05 ENCOUNTER — Other Ambulatory Visit: Payer: Self-pay | Admitting: Family Medicine

## 2014-01-08 NOTE — Telephone Encounter (Signed)
Patient last seen in office on 10-31-13. Rx last filled on 11-08-13 for #60. Please advise. If approved please print and route to Pool A so nurse can call patient to pick up

## 2014-01-18 DIAGNOSIS — M47817 Spondylosis without myelopathy or radiculopathy, lumbosacral region: Secondary | ICD-10-CM | POA: Diagnosis not present

## 2014-01-18 DIAGNOSIS — M79609 Pain in unspecified limb: Secondary | ICD-10-CM | POA: Diagnosis not present

## 2014-01-18 DIAGNOSIS — M545 Low back pain, unspecified: Secondary | ICD-10-CM | POA: Diagnosis not present

## 2014-01-18 DIAGNOSIS — G894 Chronic pain syndrome: Secondary | ICD-10-CM | POA: Diagnosis not present

## 2014-01-18 DIAGNOSIS — M961 Postlaminectomy syndrome, not elsewhere classified: Secondary | ICD-10-CM | POA: Diagnosis not present

## 2014-01-29 ENCOUNTER — Other Ambulatory Visit: Payer: Self-pay | Admitting: Family Medicine

## 2014-01-30 NOTE — Telephone Encounter (Signed)
Last lipids 6/14. ntbs

## 2014-02-05 ENCOUNTER — Telehealth: Payer: Self-pay | Admitting: Family Medicine

## 2014-02-05 NOTE — Telephone Encounter (Signed)
Appt made for shoulder pain and COPD checkup. Pt refused sooner appt.

## 2014-02-08 ENCOUNTER — Ambulatory Visit (INDEPENDENT_AMBULATORY_CARE_PROVIDER_SITE_OTHER): Payer: Medicare Other

## 2014-02-08 ENCOUNTER — Ambulatory Visit (INDEPENDENT_AMBULATORY_CARE_PROVIDER_SITE_OTHER): Payer: Medicare Other | Admitting: Family Medicine

## 2014-02-08 ENCOUNTER — Encounter: Payer: Self-pay | Admitting: Family Medicine

## 2014-02-08 VITALS — BP 132/85 | HR 81 | Temp 98.4°F | Ht 67.0 in | Wt 217.8 lb

## 2014-02-08 DIAGNOSIS — M25519 Pain in unspecified shoulder: Secondary | ICD-10-CM

## 2014-02-08 DIAGNOSIS — M25512 Pain in left shoulder: Secondary | ICD-10-CM

## 2014-02-08 DIAGNOSIS — F411 Generalized anxiety disorder: Secondary | ICD-10-CM

## 2014-02-08 DIAGNOSIS — E119 Type 2 diabetes mellitus without complications: Secondary | ICD-10-CM | POA: Diagnosis not present

## 2014-02-08 DIAGNOSIS — M545 Low back pain, unspecified: Secondary | ICD-10-CM | POA: Diagnosis not present

## 2014-02-08 DIAGNOSIS — I2510 Atherosclerotic heart disease of native coronary artery without angina pectoris: Secondary | ICD-10-CM | POA: Diagnosis not present

## 2014-02-08 LAB — POCT CBC
Granulocyte percent: 68.6 %G (ref 37–80)
HCT, POC: 46.1 % (ref 43.5–53.7)
Hemoglobin: 15.3 g/dL (ref 14.1–18.1)
Lymph, poc: 3.1 (ref 0.6–3.4)
MCH, POC: 29.9 pg (ref 27–31.2)
MCHC: 33.2 g/dL (ref 31.8–35.4)
MCV: 84 fL (ref 80–97)
MPV: 7.2 fL (ref 0–99.8)
POC Granulocyte: 7.9 — AB (ref 2–6.9)
POC LYMPH PERCENT: 27.2 %L (ref 10–50)
Platelet Count, POC: 307 10*3/uL (ref 142–424)
RBC: 5.5 M/uL (ref 4.69–6.13)
RDW, POC: 15.3 %
WBC: 11.5 10*3/uL — AB (ref 4.6–10.2)

## 2014-02-08 LAB — POCT GLYCOSYLATED HEMOGLOBIN (HGB A1C): Hemoglobin A1C: 6.4

## 2014-02-08 MED ORDER — DIAZEPAM 5 MG PO TABS: 5.0000 mg | ORAL_TABLET | Freq: Two times a day (BID) | ORAL | Status: DC | PRN

## 2014-02-08 NOTE — Progress Notes (Signed)
   Subjective:    Patient ID: Herbert May, male    DOB: Oct 16, 1940, 73 y.o.   MRN: [id number]  HPI  This 73 y.o. male presents for evaluation of routine visit.  He has hx of DM, HTN, anemia, hyperlipidemia, anxiety, OA  Chronic back pain and DDD.  He is seeing chronic pain clinic. He would like refill on valium.  He has left shoulder pain.  He is due for labs.  Review of Systems C/o left shoulder pain.   No chest pain, SOB, HA, dizziness, vision change, N/V, diarrhea, constipation, dysuria, urinary urgency or frequency, myalgias, arthralgias or rash.  Objective:   Physical Exam Vital signs noted  Well developed well nourished male.  HEENT - Head atraumatic Normocephalic                Eyes - PERRLA, Conjuctiva - clear Sclera- Clear EOMI                Ears - EAC's Wnl TM's Wnl Gross Hearing WNL                Throat - oropharanx wnl Respiratory - Lungs CTA bilateral Cardiac - RRR S1 and S2 without murmur GI - Abdomen soft Nontender and bowel sounds active x 4 Extremities - No edema. Neuro - Grossly intact. Ms - TTP left shoulder and decreased rom  Procedure - Posterior shoulder injection left shoulder with one cc kenalog and 2 cc's of lidocaine w/o epinephrine.         Assessment & Plan:  Left shoulder pain - Plan: DG Shoulder Left, diazepam (VALIUM) 5 MG tablet, DISCONTINUED: diazepam (VALIUM) 5 MG tablet  Left posterior shoulder injection.  Anxiety state, unspecified - Plan: diazepam (VALIUM) 5 MG tablet, DISCONTINUED: diazepam (VALIUM) 5 MG tablet  Bilateral low back pain without sciatica - Plan: diazepam (VALIUM) 5 MG tablet, DISCONTINUED: diazepam (VALIUM) 5 MG tablet.  DM - hgbaic and continue metformin  Chronic pain - Follow up with pain management.    HTN - Controlled and continue current regimen.  Lysbeth Penner FNP

## 2014-02-09 LAB — CMP14+EGFR
ALT: 17 IU/L (ref 0–44)
AST: 17 IU/L (ref 0–40)
Albumin/Globulin Ratio: 2.3 (ref 1.1–2.5)
Albumin: 4.5 g/dL (ref 3.5–4.8)
Alkaline Phosphatase: 68 IU/L (ref 39–117)
BUN/Creatinine Ratio: 10 (ref 10–22)
BUN: 9 mg/dL (ref 8–27)
CO2: 21 mmol/L (ref 18–29)
Calcium: 9.4 mg/dL (ref 8.6–10.2)
Chloride: 99 mmol/L (ref 97–108)
Creatinine, Ser: 0.92 mg/dL (ref 0.76–1.27)
GFR calc Af Amer: 95 mL/min/{1.73_m2} (ref >59)
GFR calc non Af Amer: 82 mL/min/{1.73_m2} (ref >59)
Globulin, Total: 2 g/dL (ref 1.5–4.5)
Glucose: 97 mg/dL (ref 65–99)
Potassium: 4.6 mmol/L (ref 3.5–5.2)
Sodium: 138 mmol/L (ref 134–144)
Total Bilirubin: 0.6 mg/dL (ref 0.0–1.2)
Total Protein: 6.5 g/dL (ref 6.0–8.5)

## 2014-02-09 LAB — THYROID PANEL WITH TSH
Free Thyroxine Index: 2.8 (ref 1.2–4.9)
T3 Uptake Ratio: 30 % (ref 24–39)
T4, Total: 9.3 ug/dL (ref 4.5–12.0)
TSH: 1.99 u[IU]/mL (ref 0.450–4.500)

## 2014-02-13 ENCOUNTER — Telehealth: Payer: Self-pay | Admitting: *Deleted

## 2014-02-13 DIAGNOSIS — M76899 Other specified enthesopathies of unspecified lower limb, excluding foot: Secondary | ICD-10-CM | POA: Diagnosis not present

## 2014-02-13 DIAGNOSIS — M25559 Pain in unspecified hip: Secondary | ICD-10-CM | POA: Diagnosis not present

## 2014-02-13 NOTE — Telephone Encounter (Signed)
Pt notified of results Verbalizes understanding 

## 2014-02-13 NOTE — Telephone Encounter (Signed)
Message copied by Marin Olp on Tue Feb 13, 2014 11:59 AM ------      Message from: Lenna Gilford, [place] A      Created: Mon Feb 12, 2014  1:06 PM       Kidney and liver function stable      Thyroid levels WNL ------

## 2014-02-26 ENCOUNTER — Other Ambulatory Visit: Payer: Self-pay | Admitting: Family Medicine

## 2014-02-27 ENCOUNTER — Telehealth: Payer: Self-pay | Admitting: *Deleted

## 2014-02-27 NOTE — Telephone Encounter (Signed)
Spoke with pt to inform that RX for Tramadol is ready for pick up Verbalizes understanding

## 2014-02-27 NOTE — Telephone Encounter (Signed)
Last seen 02/08/14, last filled 01/08/14

## 2014-03-01 ENCOUNTER — Other Ambulatory Visit: Payer: Self-pay | Admitting: Family Medicine

## 2014-03-06 NOTE — Telephone Encounter (Signed)
done

## 2014-03-14 ENCOUNTER — Other Ambulatory Visit: Payer: Self-pay | Admitting: Family Medicine

## 2014-03-20 DIAGNOSIS — M25559 Pain in unspecified hip: Secondary | ICD-10-CM | POA: Diagnosis not present

## 2014-03-20 DIAGNOSIS — M76899 Other specified enthesopathies of unspecified lower limb, excluding foot: Secondary | ICD-10-CM | POA: Diagnosis not present

## 2014-03-29 ENCOUNTER — Other Ambulatory Visit: Payer: Self-pay | Admitting: Family Medicine

## 2014-03-29 DIAGNOSIS — Z79899 Other long term (current) drug therapy: Secondary | ICD-10-CM | POA: Diagnosis not present

## 2014-03-30 ENCOUNTER — Other Ambulatory Visit: Payer: Self-pay | Admitting: Family Medicine

## 2014-03-30 MED ORDER — TRAMADOL HCL 50 MG PO TABS: 50.0000 mg | ORAL_TABLET | Freq: Four times a day (QID) | ORAL | Status: DC | PRN

## 2014-03-30 NOTE — Telephone Encounter (Signed)
Last seen 02/08/14, last filled 03/01/14. Rx will print

## 2014-06-04 ENCOUNTER — Other Ambulatory Visit: Payer: Self-pay | Admitting: Family Medicine

## 2014-06-04 NOTE — Telephone Encounter (Signed)
Pt notified he needs appt that for controlled substances refill Both he and his companion were very rude and using foul language His companion stated she was tired of the games and then hung up the phone

## 2014-06-04 NOTE — Telephone Encounter (Signed)
Last ov 8/15. Last refill 05/05/14. Route to nurse pool. If approved call to [place].

## 2014-06-12 ENCOUNTER — Other Ambulatory Visit: Payer: Self-pay | Admitting: Family Medicine

## 2014-06-12 DIAGNOSIS — M25512 Pain in left shoulder: Secondary | ICD-10-CM

## 2014-06-12 DIAGNOSIS — M545 Low back pain, unspecified: Secondary | ICD-10-CM

## 2014-06-12 DIAGNOSIS — F411 Generalized anxiety disorder: Secondary | ICD-10-CM

## 2014-06-14 DIAGNOSIS — M25512 Pain in left shoulder: Secondary | ICD-10-CM | POA: Diagnosis not present

## 2014-06-14 DIAGNOSIS — M75102 Unspecified rotator cuff tear or rupture of left shoulder, not specified as traumatic: Secondary | ICD-10-CM | POA: Diagnosis not present

## 2014-06-15 ENCOUNTER — Other Ambulatory Visit: Payer: Self-pay | Admitting: Family Medicine

## 2014-06-15 NOTE — Telephone Encounter (Signed)
Pt requesting refill on valium 5mg  1 po q12hours prn #60 with 3 refills given, he has appt 07/30/14 if ok to refill need to print.

## 2014-06-18 ENCOUNTER — Encounter: Payer: Self-pay | Admitting: Family Medicine

## 2014-06-18 ENCOUNTER — Telehealth: Payer: Self-pay | Admitting: Family Medicine

## 2014-06-18 NOTE — Telephone Encounter (Signed)
Spoke with Cicero Duck to inform again that since pt has not been seen since August that pt must have appt before refilling Valium appt was offered  Ms Herbert May became angry, using profanity and stated "just forget it" Yisroel Ramming witnessed call

## 2014-07-09 ENCOUNTER — Other Ambulatory Visit: Payer: Self-pay | Admitting: Family Medicine

## 2014-07-30 ENCOUNTER — Ambulatory Visit: Payer: Medicare Other | Admitting: Family

## 2014-09-11 ENCOUNTER — Other Ambulatory Visit: Payer: Self-pay

## 2014-09-18 ENCOUNTER — Other Ambulatory Visit: Payer: Self-pay

## 2014-09-18 MED ORDER — IPRATROPIUM BROMIDE 0.02 % IN SOLN: 500.0000 ug | Freq: Four times a day (QID) | RESPIRATORY_TRACT | Status: DC

## 2014-09-18 NOTE — Telephone Encounter (Signed)
no more refills without being seen  

## 2014-09-18 NOTE — Telephone Encounter (Signed)
Last seen 02/08/14 [place]

## 2014-10-18 IMAGING — CR DG LUMBAR SPINE COMPLETE 4+V
5 series · 5 of 5 positions shown · non-contrast
Comparison: 06/28/2013

CLINICAL DATA: Fell a month ago, back pain for 5 days, history
lumbar spine surgery

EXAM:
LUMBAR SPINE - COMPLETE 4+ VIEW

[view not recorded (1 of 5)]
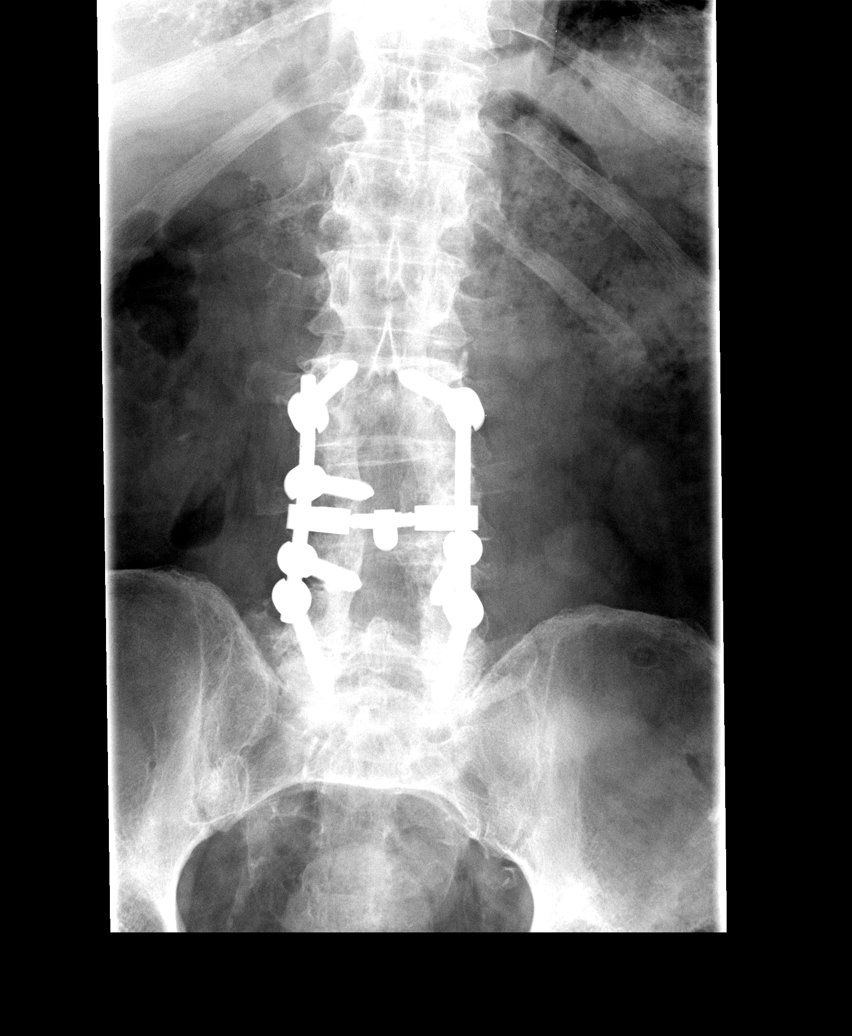

[view not recorded (2 of 5)]
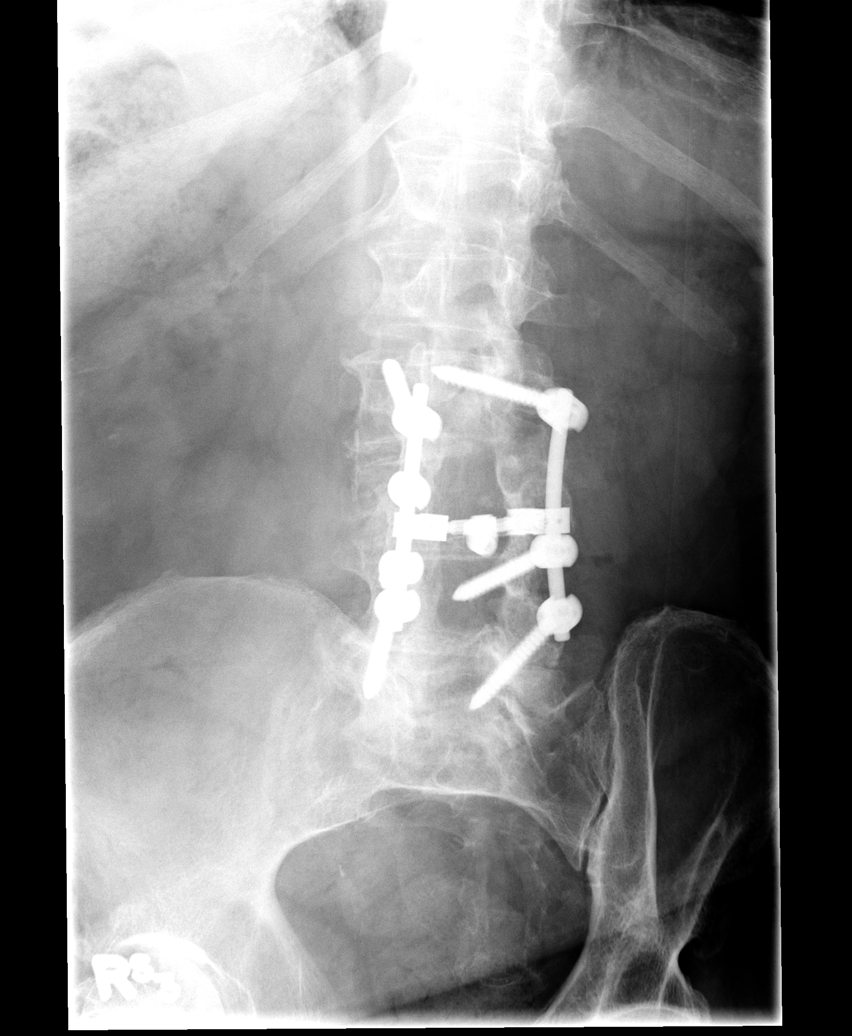

[view not recorded (3 of 5)]
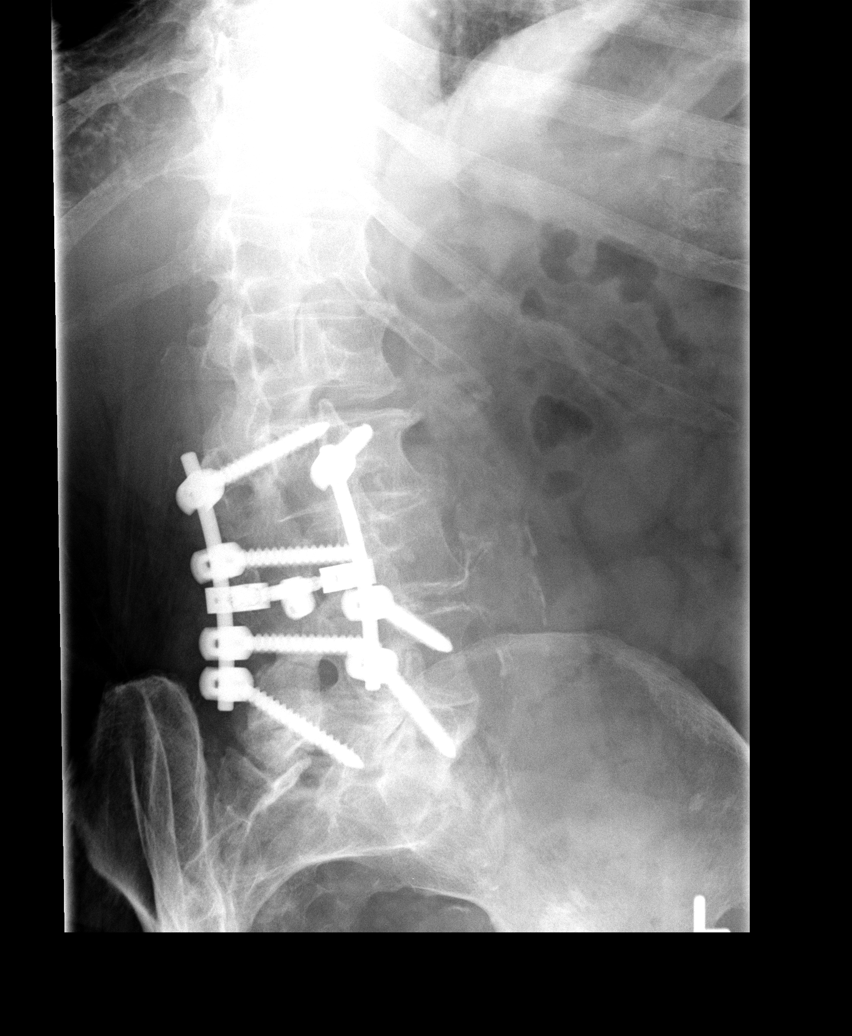

[view not recorded (4 of 5)]
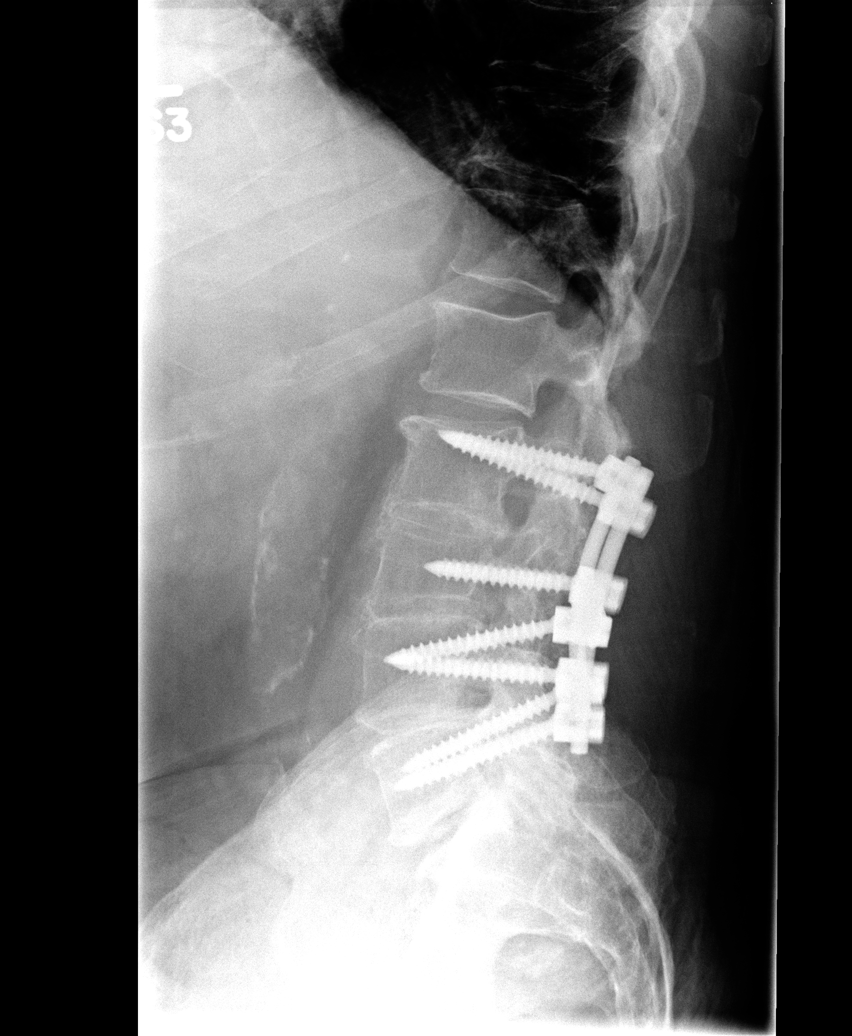

[view not recorded (5 of 5)]
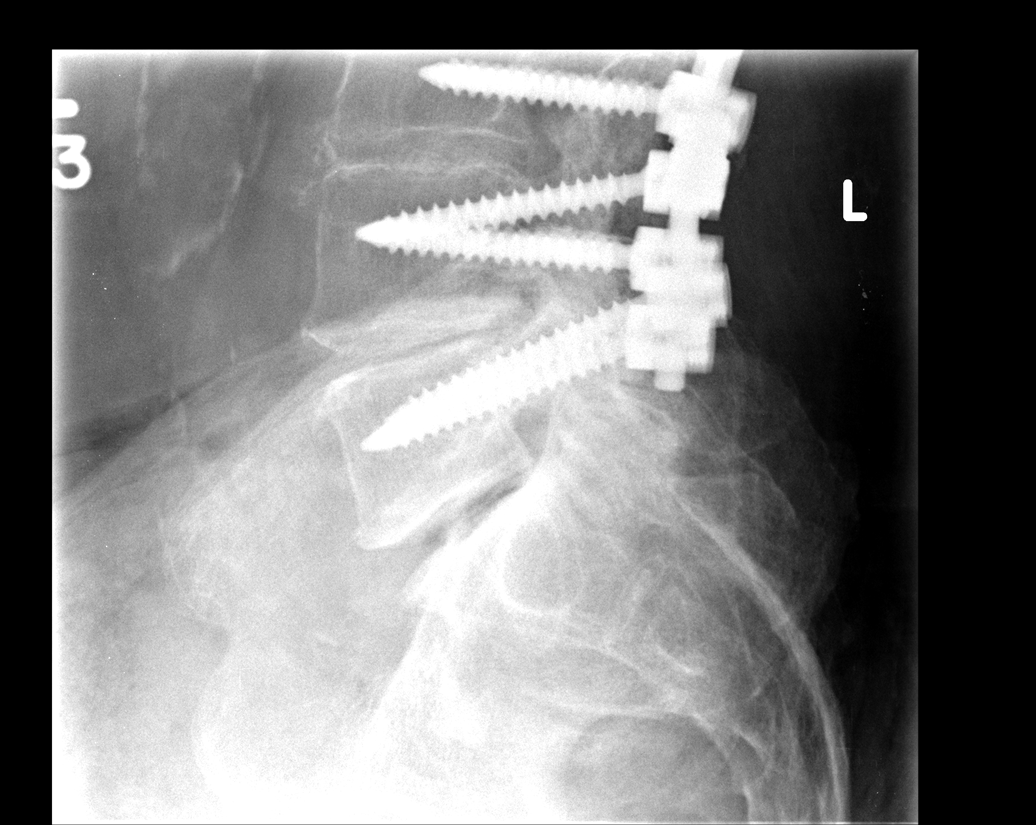

[5 of 5 positions shown; findings below may reference images not displayed]

FINDINGS: Five non-rib-bearing lumbar vertebrae.

Prior posterior fusion of L2-L5 with pedicle screws and bilateral
bars.

Diffuse osseous demineralization.

Prior laminectomies of L3 and L4.

Vertebral body heights maintained without fracture or subluxation.

Diffuse disc space narrowing lumbar spine.

Scattered facet degenerative changes.

No spondylolysis.

SI joints preserved.

Atherosclerotic calcifications aorta.
IMPRESSION: Prior L2-L5 fusion.

Osseous demineralization with scattered degenerative disc and facet
disease changes.

No acute osseous findings.

## 2014-10-25 DIAGNOSIS — M25552 Pain in left hip: Secondary | ICD-10-CM | POA: Diagnosis not present

## 2014-10-25 DIAGNOSIS — M7062 Trochanteric bursitis, left hip: Secondary | ICD-10-CM | POA: Diagnosis not present

## 2014-10-30 DIAGNOSIS — M47817 Spondylosis without myelopathy or radiculopathy, lumbosacral region: Secondary | ICD-10-CM | POA: Diagnosis not present

## 2014-10-30 DIAGNOSIS — G894 Chronic pain syndrome: Secondary | ICD-10-CM | POA: Diagnosis not present

## 2014-10-30 DIAGNOSIS — M545 Low back pain: Secondary | ICD-10-CM | POA: Diagnosis not present

## 2014-10-30 DIAGNOSIS — M79651 Pain in right thigh: Secondary | ICD-10-CM | POA: Diagnosis not present

## 2014-10-30 DIAGNOSIS — M79652 Pain in left thigh: Secondary | ICD-10-CM | POA: Diagnosis not present

## 2014-10-30 DIAGNOSIS — M6248 Contracture of muscle, other site: Secondary | ICD-10-CM | POA: Diagnosis not present

## 2014-10-31 ENCOUNTER — Other Ambulatory Visit: Payer: Self-pay | Admitting: Family Medicine

## 2014-11-21 ENCOUNTER — Other Ambulatory Visit: Payer: Self-pay | Admitting: Family Medicine

## 2014-12-06 DIAGNOSIS — M7072 Other bursitis of hip, left hip: Secondary | ICD-10-CM | POA: Diagnosis not present

## 2014-12-06 DIAGNOSIS — M25552 Pain in left hip: Secondary | ICD-10-CM | POA: Diagnosis not present

## 2014-12-18 ENCOUNTER — Other Ambulatory Visit: Payer: Self-pay | Admitting: Family Medicine

## 2014-12-19 ENCOUNTER — Other Ambulatory Visit: Payer: Self-pay

## 2014-12-19 MED ORDER — BETAMETHASONE VALERATE 0.1 % EX CREA: TOPICAL_CREAM | Freq: Two times a day (BID) | CUTANEOUS | Status: DC

## 2014-12-19 NOTE — Telephone Encounter (Signed)
Last seen 11/28/14 Dr Sabra Heck  This med not on EPIC list

## 2015-01-22 ENCOUNTER — Institutional Professional Consult (permissible substitution): Payer: Medicare Other | Admitting: Internal Medicine

## 2015-01-22 DIAGNOSIS — M7072 Other bursitis of hip, left hip: Secondary | ICD-10-CM | POA: Diagnosis not present

## 2015-01-24 ENCOUNTER — Ambulatory Visit (INDEPENDENT_AMBULATORY_CARE_PROVIDER_SITE_OTHER): Payer: Medicare Other | Admitting: Internal Medicine

## 2015-01-24 ENCOUNTER — Ambulatory Visit (INDEPENDENT_AMBULATORY_CARE_PROVIDER_SITE_OTHER)
Admission: RE | Admit: 2015-01-24 | Discharge: 2015-01-24 | Disposition: A | Discharge status: Home / Self Care | Payer: Medicare Other | Source: Ambulatory Visit | Attending: Internal Medicine | Admitting: Internal Medicine

## 2015-01-24 ENCOUNTER — Encounter: Payer: Self-pay | Admitting: Internal Medicine

## 2015-01-24 VITALS — BP 116/74 | HR 94 | Ht 67.5 in | Wt 210.8 lb

## 2015-01-24 DIAGNOSIS — R0600 Dyspnea, unspecified: Secondary | ICD-10-CM | POA: Diagnosis not present

## 2015-01-24 DIAGNOSIS — R05 Cough: Secondary | ICD-10-CM | POA: Diagnosis not present

## 2015-01-24 DIAGNOSIS — J449 Chronic obstructive pulmonary disease, unspecified: Secondary | ICD-10-CM

## 2015-01-24 DIAGNOSIS — G4734 Idiopathic sleep related nonobstructive alveolar hypoventilation: Secondary | ICD-10-CM | POA: Diagnosis not present

## 2015-01-24 NOTE — Progress Notes (Signed)
Subjective:    Patient ID: Herbert May, male    DOB: [birth year],   MRN: [id number]  HPI  28 yowm quit smoking in 1992 with breathing problems then that worsened over the years esp since 2011 lived in [place] and moved to [place] in  2013 on 02 since early 2016 at hs only.   01/24/2015 1st [facility] office visit/ Anysa Tacey  On flovent/ spiriva  Chief Complaint  Patient presents with  . Pulmonary Consult    Self referral. Pt states has had SOB "all of my life"- worse x 3 months. He states he is SOB all of the time, with or without exertion. He also c/o cough- prod with yellow sputum.    gradually worse doe x 4-5 years but can still do [employer] slowly but c/o sense of sob at rest assoc with congested cough and worse symptoms walking in hot humid conditions not much better with saba but doesn't really understand how to use them   No obvious other patterns in day to day or daytime variabilty or assoc cp or chest tightness, subjective wheeze overt sinus or hb symptoms. No unusual exp hx or h/o childhood pna/ asthma or knowledge of premature birth.  Sleeping ok without nocturnal  or early am exacerbation  of respiratory  c/o's or need for noct saba. Also denies any obvious fluctuation of symptoms with weather or environmental changes or other aggravating or alleviating factors except as outlined above   Current Medications, Allergies, Complete Past Medical History, Past Surgical History, Family History, and Social History were reviewed in [employer] record.            Review of Systems  Constitutional: Negative for fever, chills, activity change, appetite change and unexpected weight change.  HENT: Positive for congestion. Negative for dental problem, postnasal drip, rhinorrhea, sneezing, sore throat, trouble swallowing and voice change.   Eyes: Negative for visual disturbance.  Respiratory: Positive for cough and shortness of breath. Negative for choking.     Cardiovascular: Negative for chest pain and leg swelling.  Gastrointestinal: Negative for nausea, vomiting and abdominal pain.  Genitourinary: Negative for difficulty urinating.  Musculoskeletal: Negative for arthralgias.  Skin: Negative for rash.  Psychiatric/Behavioral: Negative for behavioral problems and confusion.       Objective:   Physical Exam  amb wm nad  Wt Readings from Last 3 Encounters:  01/24/15 210 lb 12.8 oz (95.618 kg)  02/08/14 217 lb 12.8 oz (98.793 kg)  08/08/13 220 lb (99.791 kg)    Vital signs reviewed   HEENT: edentulous /  nl   turbinates, and orophanx. Nl external ear canals without cough reflex   NECK :  without JVD/Nodes/TM/ nl carotid upstrokes bilaterally   LUNGS: no acc muscle use, distant bs bilaterally  s wheeze   CV:  RRR  no s3 or murmur or increase in P2, no edema   ABD:  Moderately obese but soft and nontender with limited  excursion in the supine position. No bruits or organomegaly, bowel sounds nl  MS:  warm without deformities, calf tenderness, cyanosis or clubbing  SKIN: warm and dry without lesions    NEURO:  alert, approp, no deficits     CXR PA and Lateral:   01/24/2015 :     I personally reviewed images and agree with radiology impression as follows:    No active lung disease. Hyper aeration may indicate emphysema.  Assessment & Plan:

## 2015-01-24 NOTE — Patient Instructions (Addendum)
Protonix 40 mg Take 30- 60 min before your first and last meals of the day   GERD (REFLUX)  is an extremely common cause of respiratory symptoms just like yours , many times with no obvious heartburn at all.    It can be treated with medication, but also with lifestyle changes including elevation of the head of your bed (ideally with 6 inch  bed blocks),  Smoking cessation, avoidance of late meals, excessive alcohol, and avoid fatty foods, chocolate, peppermint, colas, red wine, and acidic juices such as orange juice.  NO MINT OR MENTHOL PRODUCTS SO NO COUGH DROPS  USE SUGARLESS CANDY INSTEAD (Jolley ranchers or Stover's or Life Savers) or even ice chips will also do - the key is to swallow to prevent all throat clearing. NO OIL BASED VITAMINS - use powdered substitutes.    Plan A = Automatic = symbicort 160 2bid                                     spiriva each am   Plan B = Backup Only use your albuterol(proair)  as a rescue medication to be used if you can't catch your breath by resting or doing a relaxed purse lip breathing pattern.  - The less you use it, the better it will work when you need it. - Ok to use up to 2 puffs  every 4 hours if you must but call for immediate appointment if use goes up over your usual need - Don't leave home without it !!  (think of it like the spare tire for your car)   Plan C =  Only use your nebulizer if you try proair first and it doesn't work, ok to use up to every 4 hours if needed   Please remember to go to the lab and x-ray department downstairs for your tests - we will call you with the results when they are available. Add did not go to lab, have pt return for pfts and labs next avail (ok to do pfts at [facility])

## 2015-01-25 ENCOUNTER — Telehealth: Payer: Self-pay | Admitting: *Deleted

## 2015-01-25 ENCOUNTER — Encounter: Payer: Self-pay | Admitting: Internal Medicine

## 2015-01-25 DIAGNOSIS — J9611 Chronic respiratory failure with hypoxia: Secondary | ICD-10-CM | POA: Insufficient documentation

## 2015-01-25 DIAGNOSIS — J449 Chronic obstructive pulmonary disease, unspecified: Secondary | ICD-10-CM | POA: Insufficient documentation

## 2015-01-25 NOTE — Telephone Encounter (Signed)
Spoke with the pt and notified needs to return for labs  He verbalized understanding  I have sent order to [facility] for PFT

## 2015-01-25 NOTE — Assessment & Plan Note (Signed)
No desats here with walking albeit slow pace (his baseline)   rec ono RA to qualify for 02

## 2015-01-25 NOTE — Progress Notes (Signed)
Quick Note:  Spoke with pt and notified of results per Dr. Wert. Pt verbalized understanding and denied any questions.  ______ 

## 2015-01-25 NOTE — Assessment & Plan Note (Addendum)
-   quit smoking 1992  - [facility] 2 at first pulmonary ov 01/24/15  - 01/24/2015  Walked RA x 3 laps @ 185 ft each stopped due to  End of study, slow pace, no sob or desat       When respiratory symptoms begin or become refractory well after a patient reports complete smoking cessation,  Especially when this wasn't the case while they were smoking, a red flag is raised based on the work of Dr Kris Mouton which states:  if you quit smoking when your best day FEV1 is still well preserved it is highly unlikely you will progress to severe disease.  That is to say, once the smoking stops,  the symptoms should not suddenly erupt or markedly worsen.  If so, the differential diagnosis should include  obesity/deconditioning,  LPR/Reflux/Aspiration syndromes,  occult CHF, or  especially side effect of medications commonly used in this population.    Obesity/Deconditioning with LPR and ? chf need to be high on ddx > needs to return to complete the w/u   In meantime, The proper method of use, as well as anticipated side effects, of a metered-dose inhaler are discussed and demonstrated to the patient. Improved effectiveness after extensive coaching during this visit to a level of approximately  75% so try symbicort 160 2bid and see if can reduce reliance on saba and empirical max gerd rx plus needs cbc/ bnp/bmet/ pfts >> ordered   I had an extended discussion with the patient/wife  reviewing all relevant studies completed to date and  lasting 32 m    Each maintenance medication was reviewed in detail including most importantly the difference between maintenance and prns and under what circumstances the prns are to be triggered using an action plan format that is not reflected in the computer generated alphabetically organized AVS.    Please see instructions for details which were reviewed in writing and the patient given a copy highlighting the part that I personally wrote and discussed at today's ov.

## 2015-01-25 NOTE — Telephone Encounter (Signed)
-----   Message from Tanda Rockers, MD sent at 01/25/2015  6:43 AM EDT ----- Add did not go to lab, have pt return for pfts and labs next avail (ok to do pfts at [facility])

## 2015-01-29 DIAGNOSIS — R0902 Hypoxemia: Secondary | ICD-10-CM | POA: Diagnosis not present

## 2015-01-31 ENCOUNTER — Telehealth: Payer: Self-pay | Admitting: Family Medicine

## 2015-01-31 NOTE — Telephone Encounter (Signed)
Patient has been dismissed.  Error on calling.

## 2015-02-07 ENCOUNTER — Telehealth: Payer: Self-pay | Admitting: Internal Medicine

## 2015-02-07 ENCOUNTER — Ambulatory Visit (HOSPITAL_COMMUNITY)
Admission: RE | Admit: 2015-02-07 | Discharge: 2015-02-07 | Disposition: A | Discharge status: Home / Self Care | Payer: Medicare Other | Source: Ambulatory Visit | Attending: Internal Medicine | Admitting: Internal Medicine

## 2015-02-07 ENCOUNTER — Other Ambulatory Visit: Payer: Self-pay | Admitting: Internal Medicine

## 2015-02-07 ENCOUNTER — Ambulatory Visit: Payer: Medicare Other | Admitting: Family

## 2015-02-07 DIAGNOSIS — J449 Chronic obstructive pulmonary disease, unspecified: Secondary | ICD-10-CM | POA: Diagnosis not present

## 2015-02-07 LAB — PULMONARY FUNCTION TEST
DL/VA % PRED: 85 %
DL/VA: 3.76 ml/min/mmHg/L
DLCO UNC: 16.53 ml/min/mmHg
DLCO unc % pred: 58 %
FEF 25-75 POST: 0.75 L/s
FEF 25-75 PRE: 0.58 L/s
FEF2575-%Change-Post: 29 %
FEF2575-%PRED-POST: 37 %
FEF2575-%Pred-Pre: 28 %
FEV1-%Change-Post: 8 %
FEV1-%PRED-POST: 52 %
FEV1-%Pred-Pre: 48 %
FEV1-Post: 1.44 L
FEV1-Pre: 1.33 L
FEV1FVC-%CHANGE-POST: 0 %
FEV1FVC-%PRED-PRE: 70 %
FEV6-%Change-Post: 9 %
FEV6-%Pred-Post: 75 %
FEV6-%Pred-Pre: 69 %
FEV6-PRE: 2.45 L
FEV6-Post: 2.67 L
FEV6FVC-%Change-Post: 1 %
FEV6FVC-%Pred-Post: 103 %
FEV6FVC-%Pred-Pre: 101 %
FVC-%Change-Post: 7 %
FVC-%PRED-PRE: 68 %
FVC-%Pred-Post: 73 %
FVC-PRE: 2.59 L
FVC-Post: 2.78 L
POST FEV6/FVC RATIO: 96 %
PRE FEV1/FVC RATIO: 51 %
Post FEV1/FVC ratio: 52 %
Pre FEV6/FVC Ratio: 94 %
RV % PRED: 151 %
RV: 3.59 L
TLC % pred: 95 %
TLC: 6.12 L

## 2015-02-07 MED ORDER — ALBUTEROL SULFATE (2.5 MG/3ML) 0.083% IN NEBU
2.5000 mg | INHALATION_SOLUTION | Freq: Once | RESPIRATORY_TRACT | Status: AC
Start: 2015-02-07 — End: 2015-02-07
  Administered 2015-02-07: 2.5 mg via RESPIRATORY_TRACT

## 2015-02-07 NOTE — Telephone Encounter (Signed)
Should be d/c'd since he's on spiriva and this would be redundant

## 2015-02-07 NOTE — Telephone Encounter (Signed)
Received refill request for Atrovent 0.02% neb. Last filled by Ronnald Collum, FNP, on 09/18/2014. Never filled by MW. Pt saw MW on 01/24/15 for pulmonary consult for COPD.   Dr. Melvyn Novas please advise on refill. Thanks.

## 2015-02-07 NOTE — Telephone Encounter (Signed)
Phone note sent to Dr. Melvyn Novas for medication approval.

## 2015-02-08 MED ORDER — IPRATROPIUM BROMIDE 0.02 % IN SOLN: 500.0000 ug | Freq: Four times a day (QID) | RESPIRATORY_TRACT | Status: DC

## 2015-02-08 NOTE — Telephone Encounter (Signed)
LMTCB x 1 

## 2015-02-08 NOTE — Telephone Encounter (Signed)
It's not indicated on spiriva so he should stop spiriva if he's going to be using atrovent which is ok to take up to qid

## 2015-02-08 NOTE — Telephone Encounter (Signed)
Patient notified. Spiriva discontinued Atrovent refilled. Nothing further needed.

## 2015-02-08 NOTE — Telephone Encounter (Signed)
Patient says that he is no longer seeing Ronnald Collum, FNP.  He says that he needs the Atrovent Neb treatments because he works outdoors every day and this is the only thing that helps open him up to where he can breath.  He says that he is still taking the Spiriva as well.   Wants to know if Dr. Melvyn Novas will refill this.   Dr. Melvyn Novas, Please advise.

## 2015-02-08 NOTE — Telephone Encounter (Signed)
Pt returned call - [phone].

## 2015-02-11 ENCOUNTER — Encounter: Payer: Self-pay | Admitting: Internal Medicine

## 2015-02-13 ENCOUNTER — Emergency Department (HOSPITAL_COMMUNITY): Payer: Medicare Other

## 2015-02-13 ENCOUNTER — Emergency Department (HOSPITAL_COMMUNITY)
Admission: EM | Admit: 2015-02-13 | Discharge: 2015-02-13 | Discharge status: Against Medical Advice | Payer: Medicare Other | Attending: Emergency Medicine | Admitting: Emergency Medicine

## 2015-02-13 ENCOUNTER — Encounter (HOSPITAL_COMMUNITY): Payer: Self-pay | Admitting: *Deleted

## 2015-02-13 DIAGNOSIS — R200 Anesthesia of skin: Secondary | ICD-10-CM | POA: Insufficient documentation

## 2015-02-13 DIAGNOSIS — Z91040 Latex allergy status: Secondary | ICD-10-CM | POA: Insufficient documentation

## 2015-02-13 DIAGNOSIS — M898X7 Other specified disorders of bone, ankle and foot: Secondary | ICD-10-CM | POA: Diagnosis not present

## 2015-02-13 DIAGNOSIS — Y999 Unspecified external cause status: Secondary | ICD-10-CM | POA: Insufficient documentation

## 2015-02-13 DIAGNOSIS — Z9861 Coronary angioplasty status: Secondary | ICD-10-CM | POA: Insufficient documentation

## 2015-02-13 DIAGNOSIS — Z87891 Personal history of nicotine dependence: Secondary | ICD-10-CM | POA: Insufficient documentation

## 2015-02-13 DIAGNOSIS — I25810 Atherosclerosis of coronary artery bypass graft(s) without angina pectoris: Secondary | ICD-10-CM | POA: Diagnosis not present

## 2015-02-13 DIAGNOSIS — Y929 Unspecified place or not applicable: Secondary | ICD-10-CM | POA: Diagnosis not present

## 2015-02-13 DIAGNOSIS — R61 Generalized hyperhidrosis: Secondary | ICD-10-CM | POA: Diagnosis not present

## 2015-02-13 DIAGNOSIS — X58XXXA Exposure to other specified factors, initial encounter: Secondary | ICD-10-CM | POA: Diagnosis not present

## 2015-02-13 DIAGNOSIS — R0602 Shortness of breath: Secondary | ICD-10-CM

## 2015-02-13 DIAGNOSIS — I10 Essential (primary) hypertension: Secondary | ICD-10-CM | POA: Insufficient documentation

## 2015-02-13 DIAGNOSIS — R350 Frequency of micturition: Secondary | ICD-10-CM | POA: Insufficient documentation

## 2015-02-13 DIAGNOSIS — S92102A Unspecified fracture of left talus, initial encounter for closed fracture: Secondary | ICD-10-CM

## 2015-02-13 DIAGNOSIS — S92152A Displaced avulsion fracture (chip fracture) of left talus, initial encounter for closed fracture: Secondary | ICD-10-CM | POA: Insufficient documentation

## 2015-02-13 DIAGNOSIS — E119 Type 2 diabetes mellitus without complications: Secondary | ICD-10-CM | POA: Diagnosis not present

## 2015-02-13 DIAGNOSIS — M62838 Other muscle spasm: Secondary | ICD-10-CM | POA: Insufficient documentation

## 2015-02-13 DIAGNOSIS — R079 Chest pain, unspecified: Secondary | ICD-10-CM

## 2015-02-13 DIAGNOSIS — Z79899 Other long term (current) drug therapy: Secondary | ICD-10-CM | POA: Insufficient documentation

## 2015-02-13 DIAGNOSIS — I252 Old myocardial infarction: Secondary | ICD-10-CM | POA: Insufficient documentation

## 2015-02-13 DIAGNOSIS — Z79891 Long term (current) use of opiate analgesic: Secondary | ICD-10-CM | POA: Insufficient documentation

## 2015-02-13 DIAGNOSIS — Y939 Activity, unspecified: Secondary | ICD-10-CM | POA: Insufficient documentation

## 2015-02-13 DIAGNOSIS — J449 Chronic obstructive pulmonary disease, unspecified: Secondary | ICD-10-CM | POA: Diagnosis not present

## 2015-02-13 DIAGNOSIS — M25572 Pain in left ankle and joints of left foot: Secondary | ICD-10-CM | POA: Diagnosis present

## 2015-02-13 DIAGNOSIS — Z7951 Long term (current) use of inhaled steroids: Secondary | ICD-10-CM | POA: Diagnosis not present

## 2015-02-13 DIAGNOSIS — J441 Chronic obstructive pulmonary disease with (acute) exacerbation: Secondary | ICD-10-CM | POA: Diagnosis not present

## 2015-02-13 DIAGNOSIS — R0789 Other chest pain: Secondary | ICD-10-CM | POA: Diagnosis not present

## 2015-02-13 LAB — CBC WITH DIFFERENTIAL/PLATELET
Basophils Absolute: 0 10*3/uL (ref 0.0–0.1)
Basophils Relative: 1 % (ref 0–1)
EOS ABS: 0.2 10*3/uL (ref 0.0–0.7)
EOS PCT: 3 % (ref 0–5)
HCT: 42.2 % (ref 39.0–52.0)
Hemoglobin: 13.3 g/dL (ref 13.0–17.0)
Lymphocytes Relative: 28 % (ref 12–46)
Lymphs Abs: 2.3 10*3/uL (ref 0.7–4.0)
MCH: 26.7 pg (ref 26.0–34.0)
MCHC: 31.5 g/dL (ref 30.0–36.0)
MCV: 84.7 fL (ref 78.0–100.0)
MONOS PCT: 9 % (ref 3–12)
Monocytes Absolute: 0.7 10*3/uL (ref 0.1–1.0)
Neutro Abs: 5 10*3/uL (ref 1.7–7.7)
Neutrophils Relative %: 61 % (ref 43–77)
PLATELETS: 226 10*3/uL (ref 150–400)
RBC: 4.98 MIL/uL (ref 4.22–5.81)
RDW: 15 % (ref 11.5–15.5)
WBC: 8.3 10*3/uL (ref 4.0–10.5)

## 2015-02-13 LAB — BASIC METABOLIC PANEL
Anion gap: 8 (ref 5–15)
BUN: 10 mg/dL (ref 6–20)
CHLORIDE: 113 mmol/L — AB (ref 101–111)
CO2: 20 mmol/L — ABNORMAL LOW (ref 22–32)
CREATININE: 0.88 mg/dL (ref 0.61–1.24)
Calcium: 8.7 mg/dL — ABNORMAL LOW (ref 8.9–10.3)
GFR calc Af Amer: >60 mL/min (ref >60)
GFR calc non Af Amer: >60 mL/min (ref >60)
Glucose, Bld: 121 mg/dL — ABNORMAL HIGH (ref 65–99)
Potassium: 4.3 mmol/L (ref 3.5–5.1)
SODIUM: 141 mmol/L (ref 135–145)

## 2015-02-13 LAB — TROPONIN I

## 2015-02-13 LAB — BRAIN NATRIURETIC PEPTIDE: B Natriuretic Peptide: 19.8 pg/mL (ref 0.0–100.0)

## 2015-02-13 MED ORDER — ALBUTEROL SULFATE (2.5 MG/3ML) 0.083% IN NEBU
INHALATION_SOLUTION | RESPIRATORY_TRACT | Status: AC
  Administered 2015-02-13: 5 mg
  Filled 2015-02-13: qty 6

## 2015-02-13 MED ORDER — METHYLPREDNISOLONE SODIUM SUCC 125 MG IJ SOLR
125.0000 mg | Freq: Once | INTRAMUSCULAR | Status: AC
  Administered 2015-02-13: 125 mg via INTRAVENOUS
  Filled 2015-02-13: qty 2

## 2015-02-13 MED ORDER — OXYCODONE-ACETAMINOPHEN 5-325 MG PO TABS
1.0000 | ORAL_TABLET | Freq: Once | ORAL | Status: AC
  Administered 2015-02-13: 1 via ORAL
  Filled 2015-02-13: qty 1

## 2015-02-13 MED ORDER — ALBUTEROL SULFATE (2.5 MG/3ML) 0.083% IN NEBU: 5.0000 mg | INHALATION_SOLUTION | Freq: Once | RESPIRATORY_TRACT | Status: AC

## 2015-02-13 MED ORDER — NITROGLYCERIN 2 % TD OINT
0.5000 [in_us] | TOPICAL_OINTMENT | Freq: Once | TRANSDERMAL | Status: AC
  Administered 2015-02-13: 0.5 [in_us] via TOPICAL
  Filled 2015-02-13: qty 1

## 2015-02-13 NOTE — Discharge Instructions (Signed)
Please be advised that I want to admit you to the hospital concerning your ongoing chest pain and shortness of breath.  You are leaving against our  medical advice. You have refused our recommendation of being admitted to the hospital, and you may return at any time.  Ankle Fracture A fracture is a break in a bone. The ankle joint is made up of three bones. These include the lower (distal)sections of your lower leg bones, called the tibia and fibula, along with a bone in your foot, called the talus. Depending on how bad the break is and if more than one ankle joint bone is broken, a cast or splint is used to protect and keep your injured bone from moving while it heals. Sometimes, surgery is required to help the fracture heal properly.  There are two general types of fractures:  Stable fracture. This includes a single fracture line through one bone, with no injury to ankle ligaments. A fracture of the talus that does not have any displacement (movement of the bone on either side of the fracture line) is also stable.  Unstable fracture. This includes more than one fracture line through one or more bones in the ankle joint. It also includes fractures that have displacement of the bone on either side of the fracture line. CAUSES  A direct blow to the ankle.   Quickly and severely twisting your ankle.  Trauma, such as a car accident or falling from a significant height. RISK FACTORS You may be at a higher risk of ankle fracture if:  You have certain medical conditions.  You are involved in high-impact sports.  You are involved in a high-impact car accident. SIGNS AND SYMPTOMS   Tender and swollen ankle.  Bruising around the injured ankle.  Pain on movement of the ankle.  Difficulty walking or putting weight on the ankle.  A cold foot below the site of the ankle injury. This can occur if the blood vessels passing through your injured ankle were also damaged.  Numbness in the foot  below the site of the ankle injury. DIAGNOSIS  An ankle fracture is usually diagnosed with a physical exam and X-rays. A CT scan may also be required for complex fractures. TREATMENT  Stable fractures are treated with a cast or splint and using crutches to avoid putting weight on your injured ankle. This is followed by an ankle strengthening program. Some patients require a special type of cast, depending on other medical problems they may have. Unstable fractures require surgery to ensure the bones heal properly. Your health care provider will tell you what type of fracture you have and the best treatment for your condition. HOME CARE INSTRUCTIONS   Review correct crutch use with your health care provider and use your crutches as directed. Safe use of crutches is extremely important. Misuse of crutches can cause you to fall or cause injury to nerves in your hands or armpits.  Do not put weight or pressure on the injured ankle until directed by your health care provider.  To lessen the swelling, keep the injured leg elevated while sitting or lying down.  Apply ice to the injured area:  Put ice in a plastic bag.  Place a towel between your cast and the bag.  Leave the ice on for 20 minutes, 2-3 times a day.  If you have a plaster or fiberglass cast:  Do not try to scratch the skin under the cast with any objects. This can increase your  risk of skin infection.  Check the skin around the cast every day. You may put lotion on any red or sore areas.  Keep your cast dry and clean.  If you have a plaster splint:  Wear the splint as directed.  You may loosen the elastic around the splint if your toes become numb, tingle, or turn cold or blue.  Do not put pressure on any part of your cast or splint; it may break. Rest your cast only on a pillow the first 24 hours until it is fully hardened.  Your cast or splint can be protected during bathing with a plastic bag sealed to your skin with  medical tape. Do not lower the cast or splint into water.  Take medicines as directed by your health care provider. Only take over-the-counter or prescription medicines for pain, discomfort, or fever as directed by your health care provider.  Do not drive a vehicle until your health care provider specifically tells you it is safe to do so.  If your health care provider has given you a follow-up appointment, it is very important to keep that appointment. Not keeping the appointment could result in a chronic or permanent injury, pain, and disability. If you have any problem keeping the appointment, call the facility for assistance. SEEK MEDICAL CARE IF: You develop increased swelling or discomfort. SEEK IMMEDIATE MEDICAL CARE IF:   Your cast gets damaged or breaks.  You have continued severe pain.  You develop new pain or swelling after the cast was put on.  Your skin or toenails below the injury turn blue or gray.  Your skin or toenails below the injury feel cold, numb, or have loss of sensitivity to touch.  There is a bad smell or pus draining from under the cast. MAKE SURE YOU:   Understand these instructions.  Will watch your condition.  Will get help right away if you are not doing well or get worse. Document Released: 06/12/2000 Document Revised: 06/20/2013 Document Reviewed: 01/12/2013 [facility] Patient Information 2015 [place], [place]. This information is not intended to replace advice given to you by your health care provider. Make sure you discuss any questions you have with your health care provider.

## 2015-02-13 NOTE — ED Notes (Signed)
EMS Initally called out for neuropathy and uncontrolled pain peripherally as well as back pain. Disclosed has been having chest pain off and on for 2 weeks. SOB which is worse with exertion. EMS saw ST elevation on their 4 lead. Extensive cardiac hx.

## 2015-02-13 NOTE — ED Provider Notes (Signed)
CSN: [id number]     Arrival date & time 02/13/15  0802 History   First MD Initiated Contact with Patient 02/13/15 [phone]     Chief Complaint  Patient presents with  . Chest Pain  . Peripheral Neuropathy     (Consider location/radiation/quality/duration/timing/severity/associated sxs/prior Treatment) HPI   Patient has a PMH significant for COPD, DMT2, and extensive cardiac history who presents today with worsening shortness of breath as well as chest pain that began a couple of weeks ago.  He has tried at home albuterol treatments to no avail.  The only thing that alleviates his discomfort is percocet.  He also complains of left ankle pain, but relays no traumatic event. Review of systems he endorses night sweats, chest spasms, increased cough and sputum, nausea, increased urinary frequency and decreased emptying, numbness/tingling in the extremities.   Past Medical History  Diagnosis Date  . Heart attack   . COPD (chronic obstructive pulmonary disease)   . Colostomy status   . Diabetes mellitus without complication   . Hypertension   . Hyperlipidemia   . 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 Colostomy    . Nose surgery    . Back surgery    . Hernia repair    . Knee surgery    . Cardiac stents    . Colostomy closure     Family History  Problem Relation Age of Onset  . Cancer Mother   . Diabetes Mother   . COPD Mother   . Cancer Sister     brain  . Cancer Sister     brain   Social History  Substance Use Topics  . Smoking status: Former Smoker -- 1.00 packs/day for 15 years    Types: Cigarettes    Quit date: 12/28/1990  . Smokeless tobacco: Former Systems developer    Quit date: 06/30/1983  . Alcohol Use: 0.0 oz/week    0 Standard drinks or equivalent per week     Comment: occasional    Review of Systems  All other systems reviewed and are negative.     Allergies  Aspirin; Fish allergy;  Ibuprofen; Latex; Onion; Other; Levaquin; and Neosporin  Home Medications   Prior to Admission medications   Medication Sig Start Date End Date Taking? Authorizing Provider  albuterol (PROAIR HFA) 108 (90 BASE) MCG/ACT inhaler Inhale 1 puff into the lungs every 4 (four) hours as needed for wheezing or shortness of breath. 10/31/13   Lysbeth Penner, FNP  albuterol (PROVENTIL) (2.5 MG/3ML) 0.083% nebulizer solution Take 3 mLs (2.5 mg total) by nebulization every 4 (four) hours as needed for wheezing or shortness of breath. 06/09/13   Lysbeth Penner, FNP  budesonide-formoterol (SYMBICORT) 160-4.5 MCG/ACT inhaler Inhale 2 puffs into the lungs 2 (two) times daily. 10/31/13   Lysbeth Penner, FNP  diazepam (VALIUM) 5 MG tablet Take 1 tablet (5 mg total) by mouth every 12 (twelve) hours as needed for anxiety. 02/08/14   Lysbeth Penner, FNP  diltiazem (CARDIZEM) 30 MG tablet Take 1 tablet (30 mg total) by mouth 2 (two) times daily. 10/31/13   Lysbeth Penner, FNP  fentaNYL (DURAGESIC - DOSED MCG/HR) 100 MCG/HR Apply new patch every 72 hours 01/02/15   Historical Provider, MD  fluticasone (FLONASE) 50 MCG/ACT nasal spray Place 2 sprays into both nostrils daily. 10/31/13   Lysbeth Penner, FNP  gabapentin (NEURONTIN) 300 MG capsule Take 1 capsule (  300 mg total) by mouth 3 (three) times daily. 10/31/13   Lysbeth Penner, FNP  ipratropium (ATROVENT) 0.02 % nebulizer solution INHALE CONTENTS OF 1 VIAL IN NEBULIZER FOUR TIMES DAILY 02/08/15   Tanda Rockers, MD  ipratropium (ATROVENT) 0.02 % nebulizer solution Take 2.5 mLs (500 mcg total) by nebulization 4 (four) times daily. 02/08/15   Tanda Rockers, MD  lidocaine (LIDODERM) 5 % Place 2 patches onto the skin daily as needed (for back pain). Remove & Discard patch within 12 hours or as directed by MD 10/31/13   Lysbeth Penner, FNP  oxyCODONE-acetaminophen (PERCOCET) 10-325 MG per tablet Take 1 tablet by mouth 4 (four) times daily.    Historical Provider, MD   pantoprazole (PROTONIX) 40 MG tablet Take 1 tablet (40 mg total) by mouth 2 (two) times daily. 10/31/13   Lysbeth Penner, FNP  potassium chloride SA (K-DUR,KLOR-CON) 20 MEQ tablet Take 1 tablet (20 mEq total) by mouth once. 10/31/13   Lysbeth Penner, FNP  simvastatin (ZOCOR) 40 MG tablet TAKE ONE TABLET BY MOUTH DAILY AT 6 PM. 03/02/14   Lysbeth Penner, FNP  traZODone (DESYREL) 100 MG tablet TAKE ONE TABLET BY MOUTH AT BEDTIME AS NEEDED FOR SLEEP 03/15/14   Lysbeth Penner, FNP   BP 154/75 mmHg  Pulse 80  Temp(Src) 98.1 F (36.7 C) (Oral)  Resp 21  SpO2 98% Physical Exam  Constitutional: He is oriented to person, place, and time. He appears well-developed and well-nourished.  HENT:  Head: Normocephalic and atraumatic.  Mouth/Throat: Oropharynx is clear and moist.  Eyes: EOM are normal. Pupils are equal, round, and reactive to light.  Neck: Normal range of motion. Neck supple.  Cardiovascular: Normal rate and regular rhythm.   No murmur heard. Pulmonary/Chest: He is in respiratory distress. He has decreased breath sounds in the right lower field and the left lower field. He has wheezes in the right lower field and the left lower field. He has rales in the right lower field and the left lower field.  Abdominal: Soft. Bowel sounds are normal. He exhibits no distension. There is no tenderness.  Musculoskeletal: Normal range of motion.  Lymphadenopathy:    He has no cervical adenopathy.  Neurological: He is alert and oriented to person, place, and time.  Psychiatric: He has a normal mood and affect. His behavior is normal.    ED Course  Procedures (including critical care time) Labs Review Labs Reviewed  BASIC METABOLIC PANEL - Abnormal; Notable for the following:    Chloride 113 (*)    CO2 20 (*)    Glucose, Bld 121 (*)    Calcium 8.7 (*)    All other components within normal limits  CBC WITH DIFFERENTIAL/PLATELET  TROPONIN I  BRAIN NATRIURETIC PEPTIDE    Imaging Review Dg  Chest 2 View  02/13/2015   CLINICAL DATA:  Shortness breath.  COPD.  EXAM: CHEST - 2 VIEW  COMPARISON:  Two-view chest x-ray 01/24/2015.  FINDINGS: The heart size is normal. There slight increase in a diffuse interstitial pattern. Mild bibasilar airspace opacities are noted. Small effusion is not excluded. No significant airspace consolidation is present.  IMPRESSION: 1. 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 2. Minimal bibasilar airspace disease likely reflects atelectasis.   Electronically Signed   By: San Morelle M.D.   On: 02/13/2015 10:04   Dg Ankle Complete Left  02/13/2015  CLINICAL DATA:  No injury.  Pain entire left ankle.  EXAM: LEFT ANKLE COMPLETE - 3+ VIEW  COMPARISON:  None.  FINDINGS: Curvilinear density along the lateral aspect of the talus inferior to the ladder malleolus could reflect small avulsed fragments. Recommend clinical correlation for pain in this area and prior injury. No additional acute bony abnormality. No subluxation or dislocation. Soft tissues are intact.  IMPRESSION: Concern for small avulsed fragment off the lateral talus.   Electronically Signed   By: Rolm Baptise M.D.   On: 02/13/2015 10:05   I have personally reviewed and evaluated these images and lab results as part of my medical decision-making.   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  Labs are unremarkable.  Imaging reveals no acute abnormalities.  BNP 19.8.  EKG is unchanged from previous.  Shortness of breath and chest pain persisted after receiving neb treatment and steroids.  Admission was advised for further testing to rule out other pulmonary pathologies.  He refused admission to the hospital against medical advice.  Patient also presents with left ankle pain.  Imaging revealed small avulsion fracture of the left talus.  Pain managed in the ED and ankle splinted.  Patient advised on proper fracture care.  Patient may return at anytime.   Date: 02/13/2015 Patient: Herbert May Admitted: 02/13/2015  8:02 AM Attending Provider: Noemi Chapel, MD  Herbie Drape or his authorized caregiver has made the decision for the patient to leave the emergency department against the advice of Noemi Chapel, MD.  He or his authorized caregiver has been informed and understands the inherent risks, including death.  He or his authorized caregiver has decided to accept the responsibility for this decision. Herbie Drape and all necessary parties have been advised that he may return for further evaluation or treatment. His condition at time of discharge was Serious.  Herbie Drape had current vital signs as follows:  Blood pressure 134/62, pulse 84, temperature 98.1 F (36.7 C), temperature source Oral, resp. rate 29, SpO2 96 %.   Herbie Drape or his authorized caregiver has signed the Leaving Against Medical Advice form prior to leaving the department.  Gloriann Loan 02/13/2015        Gloriann Loan, PA 02/13/15 [place], MD 02/13/15 [phone]

## 2015-02-13 NOTE — ED Notes (Signed)
Printed off EKG performed by Junie Panning, RN and handed to [place], MD; EKG performed at [phone]

## 2015-02-13 NOTE — ED Provider Notes (Signed)
The patient is a 74 year old male, prior history of cardiac disease with multiple myocardial infarctions, stents, prior history of COPD, presents with multiple complaints including chest pain, shortness of breath but also has a complaint of left ankle pain as well as left shoulder pain the latter of which is a more chronic problem. He denies fevers or chills, he has been mildly nauseated but has no vomiting, he has had some loose stools, he denies abdominal pain.  On physical exam the patient has diffuse bilateral wheezing, decreased sounds at the left lung base but this is the same location where the patient has pain. His abdomen is nontender but obese, his left ankle is normal-appearing without redness or swelling.     EKG Interpretation  Date/Time:  Wednesday February 13 2015 08:16:38 EDT Ventricular Rate:  74 PR Interval:  164 QRS Duration: 84 QT Interval:  372 QTC Calculation: 413 R Axis:   -74 Text Interpretation:  Sinus rhythm Atrial premature complexes Left anterior fasicular block Abnormal R-wave progression, early transition Inferior infarct, old since last tracing no significant change Confirmed by Gaius Ishaq  MD, Xenia Nile ([id number]) on 02/13/2015 9:18:25 AM      Labs, chest x-ray pending, EKG abnormal but no significant changes.  Pt left AMA after extensive discussion with pt re: need for further evaluation and admission - see separate APP note.  Medical screening examination/treatment/procedure(s) were conducted as a shared visit with non-physician practitioner(s) and myself.  I personally evaluated the patient during the encounter.  Clinical Impression:   Final diagnoses:  Shortness of breath  Chest pain, unspecified chest pain type  Talus fracture, left, closed, initial encounter          Noemi Chapel, MD 02/13/15 [id number]

## 2015-02-13 NOTE — ED Notes (Signed)
Handed patient urinal to use; visitor at bedside

## 2015-02-13 NOTE — ED Notes (Signed)
Patient given a cup of ice water, ok'd by Junie Panning, Therapist, sports

## 2015-02-13 NOTE — ED Notes (Signed)
Placed patient back on monitor, continuous pulse oximetry and blood pressure cuff; warm blanket given; visitor at bedside

## 2015-02-18 DIAGNOSIS — M79672 Pain in left foot: Secondary | ICD-10-CM | POA: Diagnosis not present

## 2015-02-18 DIAGNOSIS — M2140 Flat foot [pes planus] (acquired), unspecified foot: Secondary | ICD-10-CM | POA: Diagnosis not present

## 2015-02-19 DIAGNOSIS — E784 Other hyperlipidemia: Secondary | ICD-10-CM | POA: Diagnosis not present

## 2015-02-19 DIAGNOSIS — I2510 Atherosclerotic heart disease of native coronary artery without angina pectoris: Secondary | ICD-10-CM | POA: Diagnosis not present

## 2015-02-19 DIAGNOSIS — E669 Obesity, unspecified: Secondary | ICD-10-CM | POA: Diagnosis not present

## 2015-02-19 DIAGNOSIS — J449 Chronic obstructive pulmonary disease, unspecified: Secondary | ICD-10-CM | POA: Diagnosis not present

## 2015-02-20 NOTE — Progress Notes (Signed)
Quick Note:  Spoke with pt and notified of results per Dr. Wert. Pt verbalized understanding and denied any questions.  ______ 

## 2015-02-22 ENCOUNTER — Ambulatory Visit: Payer: Medicare Other | Admitting: Pulmonary Disease

## 2015-02-27 ENCOUNTER — Encounter: Payer: Self-pay | Admitting: Internal Medicine

## 2015-02-27 ENCOUNTER — Ambulatory Visit (INDEPENDENT_AMBULATORY_CARE_PROVIDER_SITE_OTHER): Payer: Medicare Other | Admitting: Internal Medicine

## 2015-02-27 VITALS — BP 118/62 | HR 94 | Ht 67.0 in | Wt 215.0 lb

## 2015-02-27 DIAGNOSIS — J449 Chronic obstructive pulmonary disease, unspecified: Secondary | ICD-10-CM

## 2015-02-27 DIAGNOSIS — E669 Obesity, unspecified: Secondary | ICD-10-CM

## 2015-02-27 DIAGNOSIS — Z720 Tobacco use: Secondary | ICD-10-CM

## 2015-02-27 DIAGNOSIS — G4734 Idiopathic sleep related nonobstructive alveolar hypoventilation: Secondary | ICD-10-CM | POA: Diagnosis not present

## 2015-02-27 MED ORDER — IPRATROPIUM BROMIDE 0.02 % IN SOLN: 500.0000 ug | Freq: Four times a day (QID) | RESPIRATORY_TRACT | Status: DC | PRN

## 2015-02-27 MED ORDER — ALBUTEROL SULFATE (2.5 MG/3ML) 0.083% IN NEBU
2.5000 mg | INHALATION_SOLUTION | Freq: Four times a day (QID) | RESPIRATORY_TRACT | Status: DC | PRN
Start: 2015-02-27 — End: 2015-09-25

## 2015-02-27 NOTE — Assessment & Plan Note (Signed)
Body mass index is 33.67 kg/(m^2).  Lab Results  Component Value Date   TSH 1.990 02/08/2014     Contributing to gerd tendency/ doe/reviewed need  achieve and maintain neg calorie balance > defer f/u primary care including intermittently monitoring thyroid status

## 2015-02-27 NOTE — Patient Instructions (Addendum)
Ok to use the combination of albuterol/ipatriprium up to 4 x daily   Only use your albuterol as a rescue medication to be used if you can't catch your breath by resting or doing a relaxed purse lip breathing pattern.  - The less you use it, the better it will work when you need it. - Ok to use up to 2 puffs  every 4 hours if you must but call for immediate appointment if use goes up over your usual need - Don't leave home without it !!  (think of it like the spare tire for your car)  pantoprazole 40  Mg Take 30- 60 min before your first and last meals of the day   GERD (REFLUX)  is an extremely common cause of respiratory symptoms just like yours , many times with no obvious heartburn at all.    It can be treated with medication, but also with lifestyle changes including elevation of the head of your bed (ideally with 6 inch  bed blocks),  Smoking cessation, avoidance of late meals, excessive alcohol, and avoid fatty foods, chocolate, peppermint, colas, red wine, and acidic juices such as orange juice.  NO MINT OR MENTHOL PRODUCTS SO NO COUGH DROPS  USE SUGARLESS CANDY INSTEAD (Jolley ranchers or Stover's or Life Savers) or even ice chips will also do - the key is to swallow to prevent all throat clearing. NO OIL BASED VITAMINS - use powdered substitutes.   If you are satisfied with your treatment plan,  let your doctor know and he/she can either refill your medications or you can return here when your prescription runs out.     If in any way you are not 100% satisfied,  please tell us.  If 100% better, tell your friends!  Pulmonary follow up is as needed

## 2015-02-27 NOTE — Assessment & Plan Note (Addendum)
-   quit smoking 1992  - MMRC 2 at first pulmonary ov 01/24/15  - 01/24/2015  Walked RA x 3 laps @ 185 ft each stopped due to  End of study, slow pace, no sob or desat   - 01/24/2015 p extensive coaching HFA effectiveness =   75% > try symbicort 160 2bid > pt d/c'd on his own  - PFT's  02/07/2015  FEV1 1.44 (52 % ) ratio 52  p no % improvement from saba with DLCO  58 % corrects to 85 % for alv volume    The proper method of use, as well as anticipated side effects, of a metered-dose inhaler are discussed and demonstrated to the patient. Improved effectiveness after extensive coaching during this visit to a level of approximately  90%   I had an extended final summary discussion with the patient reviewing all relevant studies completed to date and  lasting 15 to 20 minutes of a 25 minute visit on the following issues:    1) copd is moderately severe and rx is directed at symptoms now - since he likes the duoneb the best then I have no problem with it.  2)  Reviewed approp rx of gerd including timing for ppi and approp diet  3) Each maintenance medication was reviewed in detail including most importantly the difference between maintenance and as needed and under what circumstances the prns are to be used.  Please see instructions for details which were reviewed in writing and the patient given a copy.

## 2015-02-27 NOTE — Progress Notes (Signed)
Subjective:   Patient ID: Herbert May, male    DOB: 1941-04-03,   MRN: [id number]     Brief patient profile:  35 yowm quit smoking in 1992 with breathing problems then that worsened over the years esp since 2011 lived in [place] and moved to [place] in  2013 on 02 since early 2016 at hs only.  Dr Legrand Rams is primary     History of Present Illness  01/24/2015 1st [facility] office visit/ Melvyn Novas  On flovent/ spiriva  Chief Complaint  Patient presents with  . Pulmonary Consult    Self referral. Pt states has had SOB "all of my life"- worse x 3 months. He states he is SOB all of the time, with or without exertion. He also c/o cough- prod with yellow sputum.    gradually worse doe x 4-5 years but can still do [employer] slowly but c/o sense of sob at rest assoc with congested cough and worse symptoms walking in hot humid conditions not much better with saba but doesn't really understand how to use them  rec  Protonix 40 mg Take 30- 60 min before your first and last meals of the day  GERD diet  Plan A = Automatic = symbicort 160 2bid                                     spiriva each am  Plan B = Backup Only use your albuterol(proair)   Plan C =  Only use your nebulizer if you try proair first and it doesn't work, ok to use up to every 4 hours if needed Please remember to go to the lab and x-ray department downstairs for your tests - we will call you with the results when they are available. Add did not go to lab, have pt return for pfts and labs next avail (ok to do pfts at [facility])   02/27/2015 f/u ov/Jakhai Fant re:  GOLD II copd  Chief Complaint  Patient presents with  . Follow-up    Pt here to discuss PFT results. Pt c/o increased SOB and wheezing. Prod cough with yellow/green mucus. Occasional chest tightness.   On ppi but not ac  Took himself off symb/spiriva "because I do the best on my atovent (with alb) neb. Cough is worst first thing in am/ clears p neb  occ shooting pains L  shoulder not related to ex or assoc with diaph/nausea/sob  No obvious day to day or daytime variability or assoc  cp or   subjective wheeze or overt sinus or hb symptoms. No unusual exp hx or h/o childhood pna/ asthma or knowledge of premature birth.  Sleeping ok without nocturnal  or early am exacerbation  of respiratory  c/o's or need for noct saba. Also denies any obvious fluctuation of symptoms with weather or environmental changes or other aggravating or alleviating factors except as outlined above   Current Medications, Allergies, Complete Past Medical History, Past Surgical History, Family History, and Social History were reviewed in [employer] record.  ROS  The following are not active complaints unless bolded sore throat, dysphagia, dental problems, itching, sneezing,  nasal congestion or excess/ purulent secretions, ear ache,   fever, chills, sweats, unintended wt loss, classically pleuritic or exertional cp, hemoptysis,  orthopnea pnd or leg swelling, presyncope, palpitations, abdominal pain, anorexia, nausea, vomiting, diarrhea  or change in bowel or bladder habits, change  in stools or urine, dysuria,hematuria,  rash, arthralgias, visual complaints, headache, numbness, weakness or ataxia or problems with walking or coordination,  change in mood/affect or memory.                 Objective:   Physical Exam  amb wm nad very shaky on details of care   02/27/2015          215  Wt Readings from Last 3 Encounters:  01/24/15 210 lb 12.8 oz (95.618 kg)  02/08/14 217 lb 12.8 oz (98.793 kg)  08/08/13 220 lb (99.791 kg)    Vital signs reviewed   HEENT: edentulous /  nl   turbinates, and orophanx. Nl external ear canals without cough reflex   NECK :  without JVD/Nodes/TM/ nl carotid upstrokes bilaterally   LUNGS: no acc muscle use, distant bs bilaterally  s wheeze   CV:  RRR  no s3 or murmur or increase in P2, no edema   ABD:  Moderately obese but soft  and nontender with limited  excursion in the supine position. No bruits or organomegaly, bowel sounds nl  MS:  warm without deformities, calf tenderness, cyanosis or clubbing  SKIN: warm and dry without lesions    NEURO:  alert, approp, no deficits     CXR PA and Lateral:   01/24/2015 :     I personally reviewed images and agree with radiology impression as follows:    No active lung disease. Hyper aeration may indicate emphysema.       Assessment & Plan:   Outpatient Encounter Prescriptions as of 02/27/2015  Medication Sig  . albuterol (PROAIR HFA) 108 (90 BASE) MCG/ACT inhaler Inhale 1 puff into the lungs every 4 (four) hours as needed for wheezing or shortness of breath.  Marland Kitchen albuterol (PROVENTIL) (2.5 MG/3ML) 0.083% nebulizer solution Take 3 mLs (2.5 mg total) by nebulization every 6 (six) hours as needed for wheezing or shortness of breath.  . diltiazem (CARDIZEM) 30 MG tablet Take 1 tablet (30 mg total) by mouth 2 (two) times daily.  . fentaNYL (DURAGESIC - DOSED MCG/HR) 100 MCG/HR Place 100 mcg onto the skin every 3 (three) days. Apply new patch every 72 hours  . metFORMIN (GLUCOPHAGE) 500 MG tablet Take 500 mg by mouth 2 (two) times daily with a meal.  . oxyCODONE-acetaminophen (PERCOCET) 10-325 MG per tablet Take 1 tablet by mouth 4 (four) times daily.  . pantoprazole (PROTONIX) 40 MG tablet Take 1 tablet (40 mg total) by mouth 2 (two) times daily.  . potassium chloride SA (K-DUR,KLOR-CON) 20 MEQ tablet Take 1 tablet (20 mEq total) by mouth once.  . simvastatin (ZOCOR) 40 MG tablet TAKE ONE TABLET BY MOUTH DAILY AT 6 PM.  . traZODone (DESYREL) 100 MG tablet TAKE ONE TABLET BY MOUTH AT BEDTIME AS NEEDED FOR SLEEP  . [DISCONTINUED] albuterol (PROVENTIL) (2.5 MG/3ML) 0.083% nebulizer solution Take 3 mLs (2.5 mg total) by nebulization every 4 (four) hours as needed for wheezing or shortness of breath.  . [DISCONTINUED] budesonide-formoterol (SYMBICORT) 160-4.5 MCG/ACT inhaler  Inhale 2 puffs into the lungs 2 (two) times daily.  . [DISCONTINUED] gabapentin (NEURONTIN) 300 MG capsule Take 1 capsule (300 mg total) by mouth 3 (three) times daily.  . diazepam (VALIUM) 5 MG tablet Take 1 tablet (5 mg total) by mouth every 12 (twelve) hours as needed for anxiety. (Patient not taking: Reported on 02/27/2015)  . fluticasone (FLONASE) 50 MCG/ACT nasal spray Place 2 sprays into both nostrils daily. (Patient not  taking: Reported on 02/13/2015)  . gabapentin (NEURONTIN) 300 MG capsule Take 1 capsule by mouth 3 (three) times daily.  Marland Kitchen ipratropium (ATROVENT) 0.02 % nebulizer solution Take 2.5 mLs (500 mcg total) by nebulization every 6 (six) hours as needed for wheezing or shortness of breath.  . lidocaine (LIDODERM) 5 % Place 2 patches onto the skin daily as needed (for back pain). Remove & Discard patch within 12 hours or as directed by MD (Patient not taking: Reported on 02/13/2015)  . [DISCONTINUED] EQ IBUPROFEN PO Take by mouth 4 (four) times daily.  . [DISCONTINUED] ipratropium (ATROVENT) 0.02 % nebulizer solution INHALE CONTENTS OF 1 VIAL IN NEBULIZER FOUR TIMES DAILY (Patient not taking: Reported on 02/27/2015)  . [DISCONTINUED] ipratropium (ATROVENT) 0.02 % nebulizer solution Take 2.5 mLs (500 mcg total) by nebulization 4 (four) times daily.   No facility-administered encounter medications on file as of 02/27/2015.

## 2015-02-27 NOTE — Assessment & Plan Note (Signed)
ono RA ordered  01/29/15  < 89% @ 31 m 36sec > rec 2lpm at hs

## 2015-03-01 ENCOUNTER — Other Ambulatory Visit: Payer: Self-pay | Admitting: Family Medicine

## 2015-03-07 DIAGNOSIS — M7062 Trochanteric bursitis, left hip: Secondary | ICD-10-CM | POA: Diagnosis not present

## 2015-04-17 DIAGNOSIS — M1990 Unspecified osteoarthritis, unspecified site: Secondary | ICD-10-CM | POA: Diagnosis not present

## 2015-04-17 DIAGNOSIS — E669 Obesity, unspecified: Secondary | ICD-10-CM | POA: Diagnosis not present

## 2015-04-17 DIAGNOSIS — J449 Chronic obstructive pulmonary disease, unspecified: Secondary | ICD-10-CM | POA: Diagnosis not present

## 2015-04-17 DIAGNOSIS — I2510 Atherosclerotic heart disease of native coronary artery without angina pectoris: Secondary | ICD-10-CM | POA: Diagnosis not present

## 2015-04-17 DIAGNOSIS — Z23 Encounter for immunization: Secondary | ICD-10-CM | POA: Diagnosis not present

## 2015-05-07 DIAGNOSIS — M25552 Pain in left hip: Secondary | ICD-10-CM | POA: Diagnosis not present

## 2015-05-07 DIAGNOSIS — M7072 Other bursitis of hip, left hip: Secondary | ICD-10-CM | POA: Diagnosis not present

## 2015-06-14 DIAGNOSIS — Z6835 Body mass index (BMI) 35.0-35.9, adult: Secondary | ICD-10-CM | POA: Diagnosis not present

## 2015-06-14 DIAGNOSIS — B0230 Zoster ocular disease, unspecified: Secondary | ICD-10-CM | POA: Diagnosis not present

## 2015-06-14 DIAGNOSIS — J449 Chronic obstructive pulmonary disease, unspecified: Secondary | ICD-10-CM | POA: Diagnosis not present

## 2015-06-14 DIAGNOSIS — I2510 Atherosclerotic heart disease of native coronary artery without angina pectoris: Secondary | ICD-10-CM | POA: Diagnosis not present

## 2015-06-26 DIAGNOSIS — M545 Low back pain: Secondary | ICD-10-CM | POA: Diagnosis not present

## 2015-06-26 DIAGNOSIS — M25512 Pain in left shoulder: Secondary | ICD-10-CM | POA: Diagnosis not present

## 2015-06-26 DIAGNOSIS — M7552 Bursitis of left shoulder: Secondary | ICD-10-CM | POA: Diagnosis not present

## 2015-06-26 DIAGNOSIS — G894 Chronic pain syndrome: Secondary | ICD-10-CM | POA: Diagnosis not present

## 2015-06-26 DIAGNOSIS — M47817 Spondylosis without myelopathy or radiculopathy, lumbosacral region: Secondary | ICD-10-CM | POA: Diagnosis not present

## 2015-06-26 DIAGNOSIS — M961 Postlaminectomy syndrome, not elsewhere classified: Secondary | ICD-10-CM | POA: Diagnosis not present

## 2015-06-26 DIAGNOSIS — M6249 Contracture of muscle, multiple sites: Secondary | ICD-10-CM | POA: Diagnosis not present

## 2015-06-26 DIAGNOSIS — M25562 Pain in left knee: Secondary | ICD-10-CM | POA: Diagnosis not present

## 2015-07-02 ENCOUNTER — Encounter: Payer: Self-pay | Admitting: *Deleted

## 2015-07-17 DIAGNOSIS — M25552 Pain in left hip: Secondary | ICD-10-CM | POA: Diagnosis not present

## 2015-07-17 DIAGNOSIS — M7072 Other bursitis of hip, left hip: Secondary | ICD-10-CM | POA: Diagnosis not present

## 2015-08-23 ENCOUNTER — Encounter (HOSPITAL_COMMUNITY): Payer: Self-pay | Admitting: Emergency Medicine

## 2015-08-23 ENCOUNTER — Emergency Department (HOSPITAL_COMMUNITY)
Admission: EM | Admit: 2015-08-23 | Discharge: 2015-08-23 | Discharge status: Against Medical Advice | Payer: Medicare Other | Attending: Emergency Medicine | Admitting: Emergency Medicine

## 2015-08-23 ENCOUNTER — Emergency Department (HOSPITAL_COMMUNITY): Payer: Medicare Other

## 2015-08-23 DIAGNOSIS — J441 Chronic obstructive pulmonary disease with (acute) exacerbation: Secondary | ICD-10-CM | POA: Diagnosis not present

## 2015-08-23 DIAGNOSIS — E119 Type 2 diabetes mellitus without complications: Secondary | ICD-10-CM | POA: Diagnosis not present

## 2015-08-23 DIAGNOSIS — Z79899 Other long term (current) drug therapy: Secondary | ICD-10-CM | POA: Insufficient documentation

## 2015-08-23 DIAGNOSIS — Z951 Presence of aortocoronary bypass graft: Secondary | ICD-10-CM | POA: Diagnosis not present

## 2015-08-23 DIAGNOSIS — Z87891 Personal history of nicotine dependence: Secondary | ICD-10-CM | POA: Insufficient documentation

## 2015-08-23 DIAGNOSIS — I25810 Atherosclerosis of coronary artery bypass graft(s) without angina pectoris: Secondary | ICD-10-CM | POA: Insufficient documentation

## 2015-08-23 DIAGNOSIS — I10 Essential (primary) hypertension: Secondary | ICD-10-CM | POA: Diagnosis not present

## 2015-08-23 DIAGNOSIS — E785 Hyperlipidemia, unspecified: Secondary | ICD-10-CM | POA: Insufficient documentation

## 2015-08-23 DIAGNOSIS — Z7984 Long term (current) use of oral hypoglycemic drugs: Secondary | ICD-10-CM | POA: Insufficient documentation

## 2015-08-23 DIAGNOSIS — I252 Old myocardial infarction: Secondary | ICD-10-CM | POA: Insufficient documentation

## 2015-08-23 DIAGNOSIS — R0602 Shortness of breath: Secondary | ICD-10-CM | POA: Diagnosis not present

## 2015-08-23 DIAGNOSIS — R0789 Other chest pain: Secondary | ICD-10-CM | POA: Diagnosis not present

## 2015-08-23 DIAGNOSIS — R079 Chest pain, unspecified: Secondary | ICD-10-CM | POA: Insufficient documentation

## 2015-08-23 DIAGNOSIS — R05 Cough: Secondary | ICD-10-CM | POA: Diagnosis not present

## 2015-08-23 LAB — BASIC METABOLIC PANEL
Anion gap: 8 (ref 5–15)
BUN: 10 mg/dL (ref 6–20)
CALCIUM: 8.8 mg/dL — AB (ref 8.9–10.3)
CO2: 24 mmol/L (ref 22–32)
CREATININE: 0.84 mg/dL (ref 0.61–1.24)
Chloride: 108 mmol/L (ref 101–111)
GFR calc Af Amer: >60 mL/min (ref >60)
Glucose, Bld: 154 mg/dL — ABNORMAL HIGH (ref 65–99)
POTASSIUM: 4.1 mmol/L (ref 3.5–5.1)
SODIUM: 140 mmol/L (ref 135–145)

## 2015-08-23 LAB — CBC
HCT: 44.9 % (ref 39.0–52.0)
Hemoglobin: 14.7 g/dL (ref 13.0–17.0)
MCH: 28.1 pg (ref 26.0–34.0)
MCHC: 32.7 g/dL (ref 30.0–36.0)
MCV: 85.9 fL (ref 78.0–100.0)
PLATELETS: 259 10*3/uL (ref 150–400)
RBC: 5.23 MIL/uL (ref 4.22–5.81)
RDW: 14.2 % (ref 11.5–15.5)
WBC: 8.6 10*3/uL (ref 4.0–10.5)

## 2015-08-23 LAB — TROPONIN I

## 2015-08-23 MED ORDER — ALBUTEROL (5 MG/ML) CONTINUOUS INHALATION SOLN
10.0000 mg/h | INHALATION_SOLUTION | Freq: Once | RESPIRATORY_TRACT | Status: AC
  Administered 2015-08-23: 10 mg/h via RESPIRATORY_TRACT
  Filled 2015-08-23: qty 20

## 2015-08-23 MED ORDER — PREDNISONE 50 MG PO TABS
60.0000 mg | ORAL_TABLET | Freq: Once | ORAL | Status: AC
  Administered 2015-08-23: 60 mg via ORAL
  Filled 2015-08-23: qty 1

## 2015-08-23 MED ORDER — ALBUTEROL SULFATE (2.5 MG/3ML) 0.083% IN NEBU
2.5000 mg | INHALATION_SOLUTION | Freq: Once | RESPIRATORY_TRACT | Status: AC
  Administered 2015-08-23: 2.5 mg via RESPIRATORY_TRACT
  Filled 2015-08-23: qty 3

## 2015-08-23 MED ORDER — IPRATROPIUM-ALBUTEROL 0.5-2.5 (3) MG/3ML IN SOLN
3.0000 mL | Freq: Once | RESPIRATORY_TRACT | Status: AC
  Administered 2015-08-23: 3 mL via RESPIRATORY_TRACT
  Filled 2015-08-23: qty 3

## 2015-08-23 MED ORDER — IPRATROPIUM BROMIDE 0.02 % IN SOLN
1.0000 mg | Freq: Once | RESPIRATORY_TRACT | Status: AC
  Administered 2015-08-23: 1 mg via RESPIRATORY_TRACT
  Filled 2015-08-23: qty 5

## 2015-08-23 NOTE — ED Notes (Signed)
Pt wishes to leave AMA, is advised of risks leaving AMA. Pt states understanding and wishes to leave at this time.

## 2015-08-23 NOTE — ED Notes (Signed)
Pt states he would like to be discharged now, MD [facility] notified

## 2015-08-23 NOTE — Progress Notes (Signed)
Initial Cat started

## 2015-08-23 NOTE — ED Provider Notes (Signed)
CSN: [id number]     Arrival date & time 08/23/15  1906 History   First MD Initiated Contact with Patient 08/23/15 1915     Chief Complaint  Patient presents with  . Chest Pain      HPI  Pt was seen at 1720. Per pt, c/o gradual onset and persistence of multiple intermittent episodes of chest "pain" for the past 2 to 3 days. Has been associated with cough, SOB and diaphoresis. Pt describes his symptoms as "like the last time I needed a stent." Symptoms worsen with exertion, improve with rest. Denies palpitations, no back pain, no abd pain, no N/V/D, no fevers, no rash.    Past Medical History  Diagnosis Date  . Heart attack ([place])   . COPD (chronic obstructive pulmonary disease) ([place])   . Colostomy status ([place])   . Diabetes mellitus without complication ([place])   . Hypertension   . Hyperlipidemia   . 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 Colostomy    . Nose surgery    . Back surgery    . Hernia repair    . Knee surgery    . Cardiac stents    . Colostomy closure     Family History  Problem Relation Age of Onset  . Cancer Mother   . Diabetes Mother   . COPD Mother   . Cancer Sister     brain  . Cancer Sister     brain   Social History  Substance Use Topics  . Smoking status: Former Smoker -- 1.00 packs/day for 15 years    Types: Cigarettes    Quit date: 12/28/1990  . Smokeless tobacco: Former Systems developer    Quit date: 06/30/1983  . Alcohol Use: 0.0 oz/week    0 Standard drinks or equivalent per week     Comment: occasional    Review of Systems ROS: Statement: All systems negative except as marked or noted in the HPI; Constitutional: Negative for fever and chills. ; ; Eyes: Negative for eye pain, redness and discharge. ; ; ENMT: Negative for ear pain, hoarseness, nasal congestion, sinus pressure and sore throat. ; ; Cardiovascular: +CP, SOB, diaphoresis. Negative for palpitations, and peripheral  edema. ; ; Respiratory: +cough. Negative for wheezing and stridor. ; ; Gastrointestinal: Negative for nausea, vomiting, diarrhea, abdominal pain, blood in stool, hematemesis, jaundice and rectal bleeding. . ; ; Genitourinary: Negative for dysuria, flank pain and hematuria. ; ; Musculoskeletal: Negative for back pain and neck pain. Negative for swelling and trauma.; ; Skin: Negative for pruritus, rash, abrasions, blisters, bruising and skin lesion.; ; Neuro: Negative for headache, lightheadedness and neck stiffness. Negative for weakness, altered level of consciousness , altered mental status, extremity weakness, paresthesias, involuntary movement, seizure and syncope.      Allergies  Aspirin; Fish allergy; Latex; Onion; Other; Levaquin; and Neosporin  Home Medications   Prior to Admission medications   Medication Sig Start Date End Date Taking? Authorizing Provider  albuterol (PROAIR HFA) 108 (90 BASE) MCG/ACT inhaler Inhale 1 puff into the lungs every 4 (four) hours as needed for wheezing or shortness of breath. 10/31/13  Yes Lysbeth Penner, FNP  albuterol (PROVENTIL) (2.5 MG/3ML) 0.083% nebulizer solution Take 3 mLs (2.5 mg total) by nebulization every 6 (six) hours as needed for wheezing or shortness of breath. 02/27/15  Yes Tanda Rockers, MD  diltiazem (CARDIZEM) 30 MG tablet Take 1 tablet (30 mg  total) by mouth 2 (two) times daily. 10/31/13  Yes Lysbeth Penner, FNP  fentaNYL (DURAGESIC - DOSED MCG/HR) 100 MCG/HR Place 100 mcg onto the skin every 3 (three) days. Apply new patch every 72 hours 01/02/15  Yes Historical Provider, MD  gabapentin (NEURONTIN) 300 MG capsule Take 1 capsule by mouth 3 (three) times daily. 02/19/15  Yes Historical Provider, MD  ipratropium (ATROVENT) 0.02 % nebulizer solution Take 2.5 mLs (500 mcg total) by nebulization every 6 (six) hours as needed for wheezing or shortness of breath. 02/27/15  Yes Tanda Rockers, MD  metFORMIN (GLUCOPHAGE) 500 MG tablet Take 500 mg by  mouth 2 (two) times daily with a meal.   Yes Historical Provider, MD  oxyCODONE-acetaminophen (PERCOCET) 10-325 MG per tablet Take 1 tablet by mouth 4 (four) times daily.   Yes Historical Provider, MD  pantoprazole (PROTONIX) 40 MG tablet Take 1 tablet (40 mg total) by mouth 2 (two) times daily. 10/31/13  Yes Lysbeth Penner, FNP  simvastatin (ZOCOR) 40 MG tablet TAKE ONE TABLET BY MOUTH DAILY AT 6 PM. 03/02/14  Yes Lysbeth Penner, FNP  traZODone (DESYREL) 100 MG tablet TAKE ONE TABLET BY MOUTH AT BEDTIME AS NEEDED FOR SLEEP Patient taking differently: TAKE ONE TABLET BY MOUTH AT BEDTIME FOR SLEEP 03/15/14  Yes Orson Ape [place], FNP   BP 158/95 mmHg  Pulse 75  Temp(Src) 98.2 F (36.8 C) (Oral)  Resp 20  Ht 5\' 7"  (1.702 m)  Wt 219 lb (99.338 kg)  BMI 34.29 kg/m2  SpO2 99% Physical Exam  1725: Physical examination:  Nursing notes reviewed; Vital signs and O2 SAT reviewed;  Constitutional: Well developed, Well nourished, Well hydrated, In no acute distress; Head:  Normocephalic, atraumatic; Eyes: EOMI, PERRL, No scleral icterus; ENMT: Mouth and pharynx normal, Mucous membranes moist; Neck: Supple, Full range of motion, No lymphadenopathy; Cardiovascular: Regular rate and rhythm, No gallop; Respiratory: Breath sounds coarse & equal bilaterally, No wheezes.  Speaking full sentences, Mild tachypnea.; Chest: No deformity, Movement normal; Abdomen: Soft, Nontender, Nondistended, Normal bowel sounds; Genitourinary: No CVA tenderness; Extremities: Pulses normal, No tenderness, No edema, No calf edema or asymmetry.; Neuro: AA&Ox3, Major CN grossly intact.  Speech clear. No gross focal motor or sensory deficits in extremities.; Skin: Color normal, Warm, Dry.   ED Course  Procedures (including critical care time) Labs Review  Imaging Review  I have personally reviewed and evaluated these images and lab results as part of my medical decision-making.   EKG Interpretation   Date/Time:  Friday August 23 2015 19:11:27 EST Ventricular Rate:  79 PR Interval:  164 QRS Duration: 85 QT Interval:  361 QTC Calculation: 414 R Axis:   -75 Text Interpretation:  Sinus rhythm Atrial premature complex Left axis  deviation Left anterior fasicular block Consider right ventricular  hypertrophy Inferior infarct, old When compared with ECG of 02/13/2015 No  significant change was found Confirmed by [employer]  MD, Nunzio Cory [phone]) on  08/23/2015 8:01:02 PM      MDM  MDM Reviewed: previous chart, nursing note and vitals Reviewed previous: labs and ECG Interpretation: labs, ECG and x-ray     Results for orders placed or performed during the hospital encounter of [id number]  Basic metabolic panel  Result Value Ref Range   Sodium 140 135 - 145 mmol/L   Potassium 4.1 3.5 - 5.1 mmol/L   Chloride 108 101 - 111 mmol/L   CO2 24 22 - 32 mmol/L   Glucose, Bld 154 (H)  65 - 99 mg/dL   BUN 10 6 - 20 mg/dL   Creatinine, Ser 0.84 0.61 - 1.24 mg/dL   Calcium 8.8 (L) 8.9 - 10.3 mg/dL   GFR calc non Af Amer >60 >60 mL/min   GFR calc Af Amer >60 >60 mL/min   Anion gap 8 5 - 15  CBC  Result Value Ref Range   WBC 8.6 4.0 - 10.5 K/uL   RBC 5.23 4.22 - 5.81 MIL/uL   Hemoglobin 14.7 13.0 - 17.0 g/dL   HCT 44.9 39.0 - 52.0 %   MCV 85.9 78.0 - 100.0 fL   MCH 28.1 26.0 - 34.0 pg   MCHC 32.7 30.0 - 36.0 g/dL   RDW 14.2 11.5 - 15.5 %   Platelets 259 150 - 400 K/uL  Troponin I  Result Value Ref Range   Troponin I <0.03 <0.031 ng/mL   Dg Chest 2 View 08/23/2015  CLINICAL DATA:  Left-sided chest pain, productive cough, and increased shortness of breath for 2 days. EXAM: CHEST - 2 VIEW COMPARISON:  Two-view chest x-ray 02/13/2015. FINDINGS: The heart size is normal. There is no edema or effusion to suggest failure. Atherosclerotic changes are again noted at the aortic arch. Minimal bibasilar airspace disease likely reflects atelectasis. No other significant focal airspace disease is present. IMPRESSION: 1. Mild  bibasilar airspace disease likely reflects atelectasis. 2. Emphysema. Electronically Signed   By: San Morelle M.D.   On: 08/23/2015 19:46    2120:  Short neb and steroid given: Lungs now with insp/exp wheezes, Sats 93-94% R/A. Hour long neb ordered.  2210:  Pt refuses to stay any longer and wants to go home. Pt and family informed re: dx testing results, including my concern for ACS as possible cause for his CP, as well as need need for further tx for COPD exacerbation, and that I recommend observation admission for further evaluation.  Pt refuses admission.  I encouraged pt to stay, continues to refuse.  Pt makes his own medical decisions.  Risks of AMA explained to pt and family, including, but not limited to:  Respiratory distress/arrest, stroke, heart attack, cardiac arrythmia ("irregular heart rate/beat"), "passing out," temporary and/or permanent disability, death.  Pt and family verb understanding and continue to refuse admission, understanding the consequences of their decision.  I encouraged pt to follow up with his PMD tomorrow and return to the ED immediately if symptoms return, worsen, he changes his mind, or for any other concerns.  Pt and family verb understanding, agreeable.    Francine Graven, DO 08/25/15 2305

## 2015-08-23 NOTE — ED Notes (Signed)
Per EMS pt reports chest wall pain that started two days ago with accompanying cough. 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 Pt reports diaphoresis, denies N/V/ dizziness.

## 2015-08-27 ENCOUNTER — Ambulatory Visit (INDEPENDENT_AMBULATORY_CARE_PROVIDER_SITE_OTHER): Payer: Medicare Other | Admitting: Cardiovascular Disease

## 2015-08-27 ENCOUNTER — Encounter: Payer: Self-pay | Admitting: *Deleted

## 2015-08-27 ENCOUNTER — Encounter: Payer: Self-pay | Admitting: Cardiovascular Disease

## 2015-08-27 VITALS — BP 148/80 | HR 98 | Ht 67.0 in | Wt 224.0 lb

## 2015-08-27 DIAGNOSIS — I25768 Atherosclerosis of bypass graft of coronary artery of transplanted heart with other forms of angina pectoris: Secondary | ICD-10-CM | POA: Diagnosis not present

## 2015-08-27 DIAGNOSIS — I350 Nonrheumatic aortic (valve) stenosis: Secondary | ICD-10-CM | POA: Diagnosis not present

## 2015-08-27 DIAGNOSIS — R079 Chest pain, unspecified: Secondary | ICD-10-CM

## 2015-08-27 DIAGNOSIS — E785 Hyperlipidemia, unspecified: Secondary | ICD-10-CM

## 2015-08-27 DIAGNOSIS — Z955 Presence of coronary angioplasty implant and graft: Secondary | ICD-10-CM | POA: Diagnosis not present

## 2015-08-27 DIAGNOSIS — I10 Essential (primary) hypertension: Secondary | ICD-10-CM

## 2015-08-27 MED ORDER — NITROGLYCERIN 0.4 MG SL SUBL: 0.4000 mg | SUBLINGUAL_TABLET | SUBLINGUAL | Status: DC | PRN

## 2015-08-27 NOTE — Progress Notes (Signed)
Patient ID: Herbert May, male   DOB: 12-25-1940, 75 y.o.   MRN: [id number]      SUBJECTIVE: The patient presents for overdue follow-up. I last evaluated him on 07/18/13. He has a history of coronary artery disease and reportedly has 3 stents , hypertension, dyslipidemia, type 2 diabetes mellitus, and emphysema. He has chronic exertional dyspnea related to COPD.  His most recent echocardiogram was in May 2013, which showed normal LV systolic function, EF 0000000, mild LVH, mild MR/TR/AS, with a peak aortic valve gradient of 26 mmHg.   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  He was evaluated for chest pain in the ED on 08/23/15. One troponin was normal and chest x-ray showed emphysema. ECG showed sinus rhythm with left anterior fascicular block.  He has been experiencing chest pain at least twice per month. He does not have any sublingual nitroglycerin.   Review of Systems: As per "subjective", otherwise negative.  Allergies  Allergen Reactions  . Aspirin Hives and Itching  . Fish Allergy Nausea And Vomiting  . Latex Other (See Comments)    Tears Skin   . Onion Nausea And Vomiting  . Other Other (See Comments)    Just doesn't like [place]   . Levaquin [Levofloxacin In D5w] Rash  . Neosporin [Neomycin-Bacitracin Zn-Polymyx] Rash    Current Outpatient Prescriptions  Medication Sig Dispense Refill  . albuterol (PROAIR HFA) 108 (90 BASE) MCG/ACT inhaler Inhale 1 puff into the lungs every 4 (four) hours as needed for wheezing or shortness of breath. 1 Inhaler 12  . albuterol (PROVENTIL) (2.5 MG/3ML) 0.083% nebulizer solution Take 3 mLs (2.5 mg total) by nebulization every 6 (six) hours as needed for wheezing or shortness of breath. 120 mL 5  . diazepam (VALIUM) 5 MG tablet Take 5 mg by mouth  daily.    Marland Kitchen diltiazem (CARDIZEM) 30 MG tablet Take 1 tablet (30 mg total) by mouth 2 (two) times daily. 30 tablet 11  . fentaNYL (DURAGESIC - DOSED MCG/HR) 100 MCG/HR Place 100 mcg onto the skin every 3 (three) days. Apply new patch every 72 hours  0  . gabapentin (NEURONTIN) 300 MG capsule Take 1 capsule by mouth 3 (three) times daily.    Marland Kitchen ipratropium (ATROVENT) 0.02 % nebulizer solution Take 2.5 mLs (500 mcg total) by nebulization every 6 (six) hours as needed for wheezing or shortness of breath. 120 mL 5  . metFORMIN (GLUCOPHAGE) 500 MG tablet Take 500 mg by mouth 2 (two) times daily with a meal.    . oxyCODONE-acetaminophen (PERCOCET) 10-325 MG per tablet Take 1 tablet by mouth 4 (four) times daily.    . pantoprazole (PROTONIX) 40 MG tablet Take 1 tablet (40 mg total) by mouth 2 (two) times daily. 60 tablet 11  . potassium chloride SA (K-DUR,KLOR-CON) 20 MEQ tablet Take 20 mEq by mouth daily.    . simvastatin (ZOCOR) 40 MG tablet TAKE ONE TABLET BY MOUTH DAILY AT 6 PM. 30 tablet 4  . traZODone (DESYREL) 100 MG tablet TAKE ONE TABLET BY MOUTH AT BEDTIME AS NEEDED FOR SLEEP (Patient taking differently: TAKE ONE TABLET BY MOUTH AT BEDTIME FOR SLEEP) 30 tablet 3   No current facility-administered medications for this visit.    Past Medical History  Diagnosis Date  . Heart attack ([place])   .  COPD (chronic obstructive pulmonary disease) ([place])   . Colostomy status ([place])   . Diabetes mellitus without complication ([place])   . Hypertension   . Hyperlipidemia   . CAD (coronary artery disease) of artery bypass graft 12/27/2012    Past Surgical History  Procedure Laterality Date  . Colostomy    . Nose surgery    . Back surgery    . Hernia repair    . Knee surgery    . Cardiac stents    . Colostomy closure      Social History   Social History  . Marital Status: Married    Spouse Name: N/A  . Number of Children: N/A  . Years of Education: N/A   Occupational History  . Not on file.    Social History Main Topics  . Smoking status: Former Smoker -- 1.00 packs/day for 15 years    Types: Cigarettes    Quit date: 12/28/1990  . Smokeless tobacco: Former Systems developer    Quit date: 06/30/1983  . Alcohol Use: 0.0 oz/week    0 Standard drinks or equivalent per week     Comment: occasional  . Drug Use: No  . Sexual Activity: Not on file   Other Topics Concern  . Not on file   Social History Narrative     Filed Vitals:   08/27/15 1310  BP: 148/80  Pulse: 98  Height: 5\' 7"  (1.702 m)  Weight: 224 lb (101.606 kg)  SpO2: 91%    PHYSICAL EXAM General: NAD HEENT: Normal. Neck: No JVD, no thyromegaly. Lungs: Clear to auscultation bilaterally with normal respiratory effort. CV: Nondisplaced PMI.  Regular rate and rhythm, normal S1/S2, no XX123456, soft systolic murmur over RUSB. Trace pretibial and periankle edema.  No carotid bruit.   Abdomen: Obese.  Neurologic: Alert and oriented.  Psych: Normal affect. Skin: Normal. Musculoskeletal: No gross deformities.  ECG: Most recent ECG reviewed.      ASSESSMENT AND PLAN: 1. Chest pain in context of CAD s/p 3 stents and 2 MI's: His most recent cardiac catheterization from 02/16/2007 showed wide patency of the mid-LAD stent and at that time, he underwent successful stenting of the proximal LAD. There was insignificant disease in the LCx and RCA. He is allergic to ASA and reportedly intolerant to beta blockers. I will obtain a Lexiscan Cardiolite to evaluate for progression of CAD. I will order a 2-D echocardiogram with Doppler to evaluate cardiac structure, function, and regional wall motion. I will prescribe SL nitroglycerin. Continue simvastatin.  2. Aortic stenosis: Mild when last evaluated. Repeat echo.   3. Essential HTN: Elevated. If elevated at next visit, I will start losartan 25 mg daily.  4. Dyslipidemia: Obtain copy of lipids and continue simvastatin.  Dispo: f/u 2 months.  Kate Sable, M.D., F.A.C.C.

## 2015-08-27 NOTE — Patient Instructions (Addendum)
Your physician has requested that you have an echocardiogram. Echocardiography is a painless test that uses sound waves to create images of your heart. It provides your doctor with information about the size and shape of your heart and how well your heart's chambers and valves are working. This procedure takes approximately one hour. There are no restrictions for this procedure. Your physician has requested that you have a lexiscan myoview. For further information please visit [email or web address]. Please follow instruction sheet, as given. Office will contact with results via phone or letter.   Nitroglycerin as needed for severe chest pain - new sent [place] today.  Continue all other medications.   Follow up in  2 months.

## 2015-09-02 DIAGNOSIS — J302 Other seasonal allergic rhinitis: Secondary | ICD-10-CM | POA: Diagnosis not present

## 2015-09-02 DIAGNOSIS — J449 Chronic obstructive pulmonary disease, unspecified: Secondary | ICD-10-CM | POA: Diagnosis not present

## 2015-09-02 DIAGNOSIS — I2510 Atherosclerotic heart disease of native coronary artery without angina pectoris: Secondary | ICD-10-CM | POA: Diagnosis not present

## 2015-09-02 DIAGNOSIS — Z6835 Body mass index (BMI) 35.0-35.9, adult: Secondary | ICD-10-CM | POA: Diagnosis not present

## 2015-09-04 ENCOUNTER — Telehealth: Payer: Self-pay | Admitting: *Deleted

## 2015-09-04 ENCOUNTER — Ambulatory Visit (INDEPENDENT_AMBULATORY_CARE_PROVIDER_SITE_OTHER): Payer: Medicare Other

## 2015-09-04 ENCOUNTER — Other Ambulatory Visit: Payer: Self-pay

## 2015-09-04 DIAGNOSIS — Z955 Presence of coronary angioplasty implant and graft: Secondary | ICD-10-CM | POA: Diagnosis not present

## 2015-09-04 DIAGNOSIS — R079 Chest pain, unspecified: Secondary | ICD-10-CM

## 2015-09-04 NOTE — Telephone Encounter (Signed)
-----   Message from Herminio Commons, MD sent at 09/04/2015  4:00 PM EST ----- Normal pumping function.

## 2015-09-04 NOTE — Telephone Encounter (Signed)
Called patient with test results. No answer. Left message to call back.  

## 2015-09-05 ENCOUNTER — Encounter (HOSPITAL_COMMUNITY): Payer: Self-pay

## 2015-09-06 ENCOUNTER — Encounter: Payer: Self-pay | Admitting: *Deleted

## 2015-09-09 ENCOUNTER — Encounter (HOSPITAL_COMMUNITY)
Admission: RE | Admit: 2015-09-09 | Discharge: 2015-09-09 | Disposition: A | Discharge status: Home / Self Care | Payer: Medicare Other | Source: Ambulatory Visit | Attending: Cardiovascular Disease | Admitting: Cardiovascular Disease

## 2015-09-09 ENCOUNTER — Inpatient Hospital Stay (HOSPITAL_COMMUNITY): Admission: RE | Admit: 2015-09-09 | Payer: Self-pay | Source: Ambulatory Visit

## 2015-09-09 ENCOUNTER — Encounter (HOSPITAL_COMMUNITY): Payer: Self-pay

## 2015-09-09 DIAGNOSIS — Z955 Presence of coronary angioplasty implant and graft: Secondary | ICD-10-CM | POA: Diagnosis not present

## 2015-09-09 DIAGNOSIS — I519 Heart disease, unspecified: Secondary | ICD-10-CM | POA: Insufficient documentation

## 2015-09-09 DIAGNOSIS — R079 Chest pain, unspecified: Secondary | ICD-10-CM

## 2015-09-09 LAB — NM MYOCAR MULTI W/SPECT W/WALL MOTION / EF
CHL CUP NUCLEAR SDS: 0
CHL CUP RESTING HR STRESS: 88 {beats}/min
CSEPPHR: 105 {beats}/min
LHR: 0.12
LV dias vol: 82 mL (ref 62–150)
LVSYSVOL: 32 mL
SRS: 12
SSS: 12
TID: 1.12

## 2015-09-09 MED ORDER — TECHNETIUM TC 99M SESTAMIBI - CARDIOLITE
30.0000 | Freq: Once | INTRAVENOUS | Status: AC | PRN
  Administered 2015-09-09: 11:00:00 30 via INTRAVENOUS

## 2015-09-09 MED ORDER — REGADENOSON 0.4 MG/5ML IV SOLN
INTRAVENOUS | Status: AC
  Administered 2015-09-09: 0.4 mg via INTRAVENOUS
  Filled 2015-09-09: qty 5

## 2015-09-09 MED ORDER — SODIUM CHLORIDE 0.9% FLUSH
INTRAVENOUS | Status: AC
Start: 2015-09-09 — End: 2015-09-09
  Administered 2015-09-09: 10 mL via INTRAVENOUS
  Filled 2015-09-09: qty 10

## 2015-09-09 MED ORDER — TECHNETIUM TC 99M SESTAMIBI GENERIC - CARDIOLITE
10.0000 | Freq: Once | INTRAVENOUS | Status: AC | PRN
  Administered 2015-09-09: 10 via INTRAVENOUS

## 2015-09-10 ENCOUNTER — Telehealth: Payer: Self-pay | Admitting: *Deleted

## 2015-09-10 NOTE — Telephone Encounter (Signed)
-----   Message from Levasy sent at 09/10/2015  1:22 PM EDT -----   ----- Message -----    From: Arnoldo Lenis, MD    Sent: 09/10/2015   1:11 PM      To: Staci T Ashworth, CMA  Stress test shows no evidence of new blockages. Dr Raliegh Ip to discuss further at follow up but overall findings look ok  Zandra Abts MD

## 2015-09-10 NOTE — Telephone Encounter (Signed)
Notes Recorded by Laurine Blazer, LPN on [id number] at [id number] PM Patient notified. Copy to pmd.

## 2015-09-18 DIAGNOSIS — M25552 Pain in left hip: Secondary | ICD-10-CM | POA: Diagnosis not present

## 2015-09-18 DIAGNOSIS — M7062 Trochanteric bursitis, left hip: Secondary | ICD-10-CM | POA: Diagnosis not present

## 2015-09-23 ENCOUNTER — Encounter: Payer: Self-pay | Admitting: *Deleted

## 2015-09-25 ENCOUNTER — Other Ambulatory Visit: Payer: Self-pay | Admitting: Internal Medicine

## 2015-10-29 ENCOUNTER — Ambulatory Visit: Payer: Self-pay | Admitting: Cardiovascular Disease

## 2015-11-06 DIAGNOSIS — M549 Dorsalgia, unspecified: Secondary | ICD-10-CM | POA: Diagnosis not present

## 2015-11-06 DIAGNOSIS — I2510 Atherosclerotic heart disease of native coronary artery without angina pectoris: Secondary | ICD-10-CM | POA: Diagnosis not present

## 2015-11-06 DIAGNOSIS — Z6834 Body mass index (BMI) 34.0-34.9, adult: Secondary | ICD-10-CM | POA: Diagnosis not present

## 2015-11-06 DIAGNOSIS — J449 Chronic obstructive pulmonary disease, unspecified: Secondary | ICD-10-CM | POA: Diagnosis not present

## 2015-11-22 ENCOUNTER — Encounter: Payer: Self-pay | Admitting: Cardiovascular Disease

## 2015-11-22 ENCOUNTER — Ambulatory Visit (INDEPENDENT_AMBULATORY_CARE_PROVIDER_SITE_OTHER): Payer: Medicare Other | Admitting: Cardiovascular Disease

## 2015-11-22 VITALS — BP 119/67 | HR 83 | Ht 67.0 in | Wt 218.0 lb

## 2015-11-22 DIAGNOSIS — E785 Hyperlipidemia, unspecified: Secondary | ICD-10-CM | POA: Diagnosis not present

## 2015-11-22 DIAGNOSIS — I10 Essential (primary) hypertension: Secondary | ICD-10-CM

## 2015-11-22 DIAGNOSIS — R079 Chest pain, unspecified: Secondary | ICD-10-CM

## 2015-11-22 DIAGNOSIS — I358 Other nonrheumatic aortic valve disorders: Secondary | ICD-10-CM | POA: Diagnosis not present

## 2015-11-22 DIAGNOSIS — Z955 Presence of coronary angioplasty implant and graft: Secondary | ICD-10-CM

## 2015-11-22 NOTE — Progress Notes (Signed)
Patient ID: Herbert May, male   DOB: 1941-01-14, 75 y.o.   MRN: [id number]      SUBJECTIVE: The patient returns for follow-up after undergoing cardiovascular testing performed for the evaluation of chest pain.  Nuclear stress test 09/09/15 demonstrated a non-reversible defect consistent with artifact. There was no reported ischemia. LVEF 62%. However, it was deemed an intermediate risk study.  Echocardiogram 09/04/15 demonstrated normal left ventricular systolic function, EF 0000000, indeterminate diastolic function and aortic valve sclerosis without stenosis.  He has only had to use sublingual nitroglycerin on one occasion since his last office visit with me on 08/27/15.  Review of Systems: As per "subjective", otherwise negative.  Allergies  Allergen Reactions  . Aspirin Hives and Itching  . Fish Allergy Nausea And Vomiting  . Latex Other (See Comments)    Tears Skin   . Onion Nausea And Vomiting  . Other Other (See Comments)    Just doesn't like [place]   . Levaquin [Levofloxacin In D5w] Rash  . Neosporin [Neomycin-Bacitracin Zn-Polymyx] Rash    Current Outpatient Prescriptions  Medication Sig Dispense Refill  . albuterol (PROAIR HFA) 108 (90 BASE) MCG/ACT inhaler Inhale 1 puff into the lungs every 4 (four) hours as needed for wheezing or shortness of breath. 1 Inhaler 12  . albuterol (PROVENTIL) (2.5 MG/3ML) 0.083% nebulizer solution INHALE CONTENTS OF 1 VIAL IN NEBULIZER EVERY 6 HOURS AS NEEDED FOR FOR WHEEZING OR SHORTNESS OF BREATH. 360 mL 6  . diazepam (VALIUM) 5 MG tablet Take 5 mg by mouth daily.    Marland Kitchen diltiazem (CARDIZEM) 30 MG tablet Take 1 tablet (30 mg total) by mouth 2 (two) times daily. 30 tablet 11  . fentaNYL (DURAGESIC - DOSED MCG/HR) 100 MCG/HR Place 100 mcg onto the skin every 3 (three) days. Apply new patch every 72 hours  0  . gabapentin (NEURONTIN) 300 MG capsule Take 1 capsule by mouth 3 (three) times daily.    Marland Kitchen ipratropium (ATROVENT) 0.02 % nebulizer  solution INHALE CONTENTS OF 1 VIAL IN NEBULIZER EVERY 6 HOURS AS NEEDED FOR FOR WHEEZING AND SHORTNESS OF BREATH. 300 mL 6  . metFORMIN (GLUCOPHAGE) 500 MG tablet Take 500 mg by mouth 2 (two) times daily with a meal.    . nitroGLYCERIN (NITROSTAT) 0.4 MG SL tablet Place 1 tablet (0.4 mg total) under the tongue every 5 (five) minutes as needed for chest pain. 25 tablet 3  . oxyCODONE-acetaminophen (PERCOCET) 10-325 MG per tablet Take 1 tablet by mouth 4 (four) times daily.    . pantoprazole (PROTONIX) 40 MG tablet Take 1 tablet (40 mg total) by mouth 2 (two) times daily. 60 tablet 11  . potassium chloride SA (K-DUR,KLOR-CON) 20 MEQ tablet Take 20 mEq by mouth daily.    . simvastatin (ZOCOR) 40 MG tablet TAKE ONE TABLET BY MOUTH DAILY AT 6 PM. 30 tablet 4  . traZODone (DESYREL) 100 MG tablet TAKE ONE TABLET BY MOUTH AT BEDTIME AS NEEDED FOR SLEEP (Patient taking differently: TAKE ONE TABLET BY MOUTH AT BEDTIME FOR SLEEP) 30 tablet 3   No current facility-administered medications for this visit.    Past Medical History  Diagnosis Date  . Heart attack ([place])   . COPD (chronic obstructive pulmonary disease) ([place])   . Colostomy status ([place])   . Diabetes mellitus without complication ([place])   . Hypertension   . Hyperlipidemia   . CAD (coronary artery disease) of artery bypass graft 12/27/2012    Past Surgical History  Procedure Laterality Date  .  Colostomy    . Nose surgery    . Back surgery    . Hernia repair    . Knee surgery    . Cardiac stents    . Colostomy closure      Social History   Social History  . Marital Status: Married    Spouse Name: N/A  . Number of Children: N/A  . Years of Education: N/A   Occupational History  . Not on file.   Social History Main Topics  . Smoking status: Former Smoker -- 1.00 packs/day for 37 years    Types: Cigarettes    Start date: 12/30/1953    Quit date: 12/28/1990  . Smokeless tobacco: Former Systems developer    Quit date: 06/30/1983  . Alcohol  Use: 0.0 oz/week    0 Standard drinks or equivalent per week     Comment: occasional  . Drug Use: No  . Sexual Activity: Not on file   Other Topics Concern  . Not on file   Social History Narrative     Filed Vitals:   11/22/15 0925  BP: 119/67  Pulse: 83  Height: 5\' 7"  (1.702 m)  Weight: 218 lb (98.884 kg)    PHYSICAL EXAM General: NAD HEENT: Normal. Neck: No JVD, no thyromegaly. Lungs: Clear to auscultation bilaterally with normal respiratory effort. CV: Nondisplaced PMI. Regular rate and rhythm, normal S1/S2, no XX123456, soft systolic murmur over RUSB. Trace pretibial and periankle edema.  Abdomen: Obese.  Neurologic: Alert and oriented.  Psych: Normal affect. Skin: Normal. Musculoskeletal: No gross deformities.   ECG: Most recent ECG reviewed.      ASSESSMENT AND PLAN: 1. Chest pain in context of CAD s/p 3 stents and 2 MI's: Improved with only isolated episode of chest pain relieved with one nitro tablet. His most recent cardiac catheterization from 02/16/2007 showed wide patency of the mid-LAD stent and at that time, he underwent successful stenting of the proximal LAD. There was insignificant disease in the LCx and RCA. He is allergic to ASA and reportedly intolerant to beta blockers. Continue simvastatin. Stress test without ischemia.  2. Aortic stenosis: Echo reviewed above. Sclerosis w/o stenosis.  3. Essential HTN: Controlled. No changes.  4. Dyslipidemia: Obtain copy of lipids and continue simvastatin.  Dispo: f/u 4 months.   Kate Sable, M.D., F.A.C.C.

## 2015-11-22 NOTE — Patient Instructions (Signed)
Continue all current medications. Follow up in  4 months  

## 2015-11-26 ENCOUNTER — Other Ambulatory Visit: Payer: Self-pay | Admitting: Cardiovascular Disease

## 2015-11-28 ENCOUNTER — Ambulatory Visit: Payer: Medicare Other | Attending: Pain Medicine | Admitting: Pain Medicine

## 2015-11-28 ENCOUNTER — Encounter: Payer: Self-pay | Admitting: Pain Medicine

## 2015-11-28 VITALS — BP 103/78 | HR 86 | Temp 98.2°F | Resp 20 | Ht 67.0 in | Wt 218.0 lb

## 2015-11-28 DIAGNOSIS — M5481 Occipital neuralgia: Secondary | ICD-10-CM | POA: Insufficient documentation

## 2015-11-28 DIAGNOSIS — M47816 Spondylosis without myelopathy or radiculopathy, lumbar region: Secondary | ICD-10-CM

## 2015-11-28 DIAGNOSIS — M19012 Primary osteoarthritis, left shoulder: Secondary | ICD-10-CM

## 2015-11-28 DIAGNOSIS — M170 Bilateral primary osteoarthritis of knee: Secondary | ICD-10-CM | POA: Insufficient documentation

## 2015-11-28 DIAGNOSIS — M5030 Other cervical disc degeneration, unspecified cervical region: Secondary | ICD-10-CM | POA: Insufficient documentation

## 2015-11-28 DIAGNOSIS — I252 Old myocardial infarction: Secondary | ICD-10-CM | POA: Diagnosis not present

## 2015-11-28 DIAGNOSIS — E119 Type 2 diabetes mellitus without complications: Secondary | ICD-10-CM | POA: Diagnosis not present

## 2015-11-28 DIAGNOSIS — R634 Abnormal weight loss: Secondary | ICD-10-CM | POA: Insufficient documentation

## 2015-11-28 DIAGNOSIS — M19019 Primary osteoarthritis, unspecified shoulder: Secondary | ICD-10-CM | POA: Diagnosis not present

## 2015-11-28 DIAGNOSIS — J45909 Unspecified asthma, uncomplicated: Secondary | ICD-10-CM | POA: Diagnosis not present

## 2015-11-28 DIAGNOSIS — I10 Essential (primary) hypertension: Secondary | ICD-10-CM | POA: Insufficient documentation

## 2015-11-28 DIAGNOSIS — M5416 Radiculopathy, lumbar region: Secondary | ICD-10-CM | POA: Diagnosis not present

## 2015-11-28 DIAGNOSIS — Z9889 Other specified postprocedural states: Secondary | ICD-10-CM | POA: Diagnosis not present

## 2015-11-28 DIAGNOSIS — J439 Emphysema, unspecified: Secondary | ICD-10-CM | POA: Insufficient documentation

## 2015-11-28 DIAGNOSIS — M545 Low back pain: Secondary | ICD-10-CM | POA: Diagnosis not present

## 2015-11-28 DIAGNOSIS — M79606 Pain in leg, unspecified: Secondary | ICD-10-CM | POA: Diagnosis present

## 2015-11-28 DIAGNOSIS — M19011 Primary osteoarthritis, right shoulder: Secondary | ICD-10-CM

## 2015-11-28 DIAGNOSIS — M5136 Other intervertebral disc degeneration, lumbar region: Secondary | ICD-10-CM | POA: Insufficient documentation

## 2015-11-28 DIAGNOSIS — M533 Sacrococcygeal disorders, not elsewhere classified: Secondary | ICD-10-CM | POA: Diagnosis not present

## 2015-11-28 DIAGNOSIS — E1340 Other specified diabetes mellitus with diabetic neuropathy, unspecified: Secondary | ICD-10-CM | POA: Diagnosis not present

## 2015-11-28 NOTE — Progress Notes (Signed)
Safety precautions to be maintained throughout the outpatient stay will include: orient to surroundings, keep bed in low position, maintain call bell within reach at all times, provide assistance with transfer out of bed and ambulation.  

## 2015-11-28 NOTE — Patient Instructions (Addendum)
PLAN  Continue present medications   Lumbar facet, medial branch nerve, blocks to be performed at time of return appointment  F/U PCP Dr. Rosita Fire  for evaliation of  BP and general medical condition . We will need medical clearance from her primary care physician to perform lumbar facet, medial branch nerve, blocks  F/U surgical evaluation. May consider pending follow-up evaluations  F/U neurological evaluation. May consider pending follow-up evaluations  May consider radiofrequency rhizolysis or intraspinal procedures pending response to present treatment and F/U evaluation   Patient to call Pain Management Center should patient have concerns prior to scheduled return appointment. Facet Blocks Patient Information  Description: The facets are joints in the spine between the vertebrae.  Like any joints in the body, facets can become irritated and painful.  Arthritis can also effect the facets.  By injecting steroids and local anesthetic in and around these joints, we can temporarily block the nerve supply to them.  Steroids act directly on irritated nerves and tissues to reduce selling and inflammation which often leads to decreased pain.  Facet blocks may be done anywhere along the spine from the neck to the low back depending upon the location of your pain.   After numbing the skin with local anesthetic (like Novocaine), a small needle is passed onto the facet joints under x-ray guidance.  You may experience a sensation of pressure while this is being done.  The entire block usually lasts about 15-25 minutes.   Conditions which may be treated by facet blocks:   Low back/buttock pain  Neck/shoulder pain  Certain types of headaches  Preparation for the injection:  1. Do not eat any solid food or dairy products within 8 hours of your appointment. 2. You may drink clear liquid up to 3 hours before appointment.  Clear liquids include water, black coffee, juice or soda.  No milk or  cream please. 3. You may take your regular medication, including pain medications, with a sip of water before your appointment.  Diabetics should hold regular insulin (if taken separately) and take 1/2 normal NPH dose the morning of the procedure.  Carry some sugar containing items with you to your appointment. 4. A driver must accompany you and be prepared to drive you home after your procedure. 5. Bring all your current medications with you. 6. An IV may be inserted and sedation may be given at the discretion of the physician. 7. A blood pressure cuff, EKG and other monitors will often be applied during the procedure.  Some patients may need to have extra oxygen administered for a short period. 8. You will be asked to provide medical information, including your allergies and medications, prior to the procedure.  We must know immediately if you are taking blood thinners (like Coumadin/Warfarin) or if you are allergic to IV iodine contrast (dye).  We must know if you could possible be pregnant.  Possible side-effects:   Bleeding from needle site  Infection (rare, may require surgery)  Nerve injury (rare)  Numbness & tingling (temporary)  Difficulty urinating (rare, temporary)  Spinal headache (a headache worse with upright posture)  Light-headedness (temporary)  Pain at injection site (serveral days)  Decreased blood pressure (rare, temporary)  Weakness in arm/leg (temporary)  Pressure sensation in back/neck (temporary)   Call if you experience:   Fever/chills associated with headache or increased back/neck pain  Headache worsened by an upright position  New onset, weakness or numbness of an extremity below the injection site  Hives or difficulty breathing (go to the emergency room)  Inflammation or drainage at the injection site(s)  Severe back/neck pain greater than usual  New symptoms which are concerning to you  Please note:  Although the local anesthetic  injected can often make your back or neck feel good for several hours after the injection, the pain will likely return. It takes 3-7 days for steroids to work.  You may not notice any pain relief for at least one week.  If effective, we will often do a series of 2-3 injections spaced 3-6 weeks apart to maximally decrease your pain.  After the initial series, you may be a candidate for a more permanent nerve block of the facets.  If you have any questions, please call #[phone]) [facility]

## 2015-11-28 NOTE — Progress Notes (Signed)
Subjective:    Patient ID: Herbert May, male    DOB: [birth year], 75 y.o.   MRN: [id number]  HPI  The patient is a 75 year old gentleman who comes to pain management at the request of Dr. Neomia Dear for further evaluation and treatment of pain involving the lower back and lower extremity region predominantly with pain involving the shoulder as well as the lesser degree. The patient states that he has had prior surgery of the neck as well as the back and surgery of the knee. The patient states that his pain involving the lower back is the most bothersome pain. The patient states that the pain is aching and constant sharp shooting sensation that awakens patient from sleep. The patient states the pain increases with bending kneeling lifting sitting standing stooping. Patient states the pain also increases with walking. The patient states the pain decreases with warm showers and baths and medications. The patient also admits to some relief of his pain with prior nerve blocks. We discussed patient's condition including medications and prior treatment and will consider patient for modification of medications and will proceed with lumbar facet, medial branch nerve, blocks to be performed at time return appointment pending medical clearance palpation for the procedure. The patient was with understanding and agreed to suggested treatment plan.   Review of Systems    Cardiovascular: High blood pressure Heart attack 2010 Prior heart catheterization  Pulmonary: Asthma Emphysema  Neurological: Unremarkable  Psychological: Unremarkable  Gastrointestinal: Unremarkable  Genitourinary: Unremarkable  Hematologic: Unremarkable  Endocrine: Diabetes mellitus  Rheumatological: Unremarkable  Musculoskeletal: Unremarkable  Other significant: Weight loss      Objective:   Physical Exam   There was tenderness to palpation of paraspinal musculature region of the cervical  region cervical facet region a moderate degree with well-healed scar of the cervical region without increased warmth and erythema in the region of the scar. There was limited range of motion of the cervical spine with forward appeared to be unremarkable Spurling's maneuver. Palpation of the acromioclavicular and glenohumeral joint regions reproduced pain of moderate degree. The patient appeared to be with slightly decreased grip strength and Tinel and Phalen's maneuver were without increased pain of significant degree. There was tenderness over the thoracic facet thoracic paraspinal musculature region with no crepitus of the thoracic region noted. Over the region of the lumbar region lumbar facet region was associated with increased pain of moderate degree with lateral bending rotation extension and palpation over the lumbar facets reproducing moderately severe discomfort. Straight leg raising was tolerates approximately 20 without a definite increase of pain with dorsiflexion noted. No definite sensory deficit of dermatomal distribution was detected. There was negative clonus negative Homans. He was well-healed scar of the knee without increased warmth and erythema in the region of the scar. There was negative anterior and posterior drawer signs without ballottement of the patella. There was no abdominal tends to palpation and no costovertebral angle tenderness noted.     Assessment & Plan:    Degenerative disc disease lumbar spine  Status post lumbar surgery  Lumbar facet syndrome  Degenerative disc disease cervical spine  Status post cervical surgery  Cervical facet syndrome  Bilateral occipital neuralgia  Degenerative joint disease of knees  Status post surgery of knee  Degenerative joint disease of shoulder  Sacroiliac joint dysfunction  Diabetes mellitus     PLAN  Continue present medication  Lumbar facet, medial branch nerve, blocks to be performed at time of return  appointment  F/U PCP Dr Rosita Fire for evaliation of  BP and general medical  condition. We will request medical clearance for lumbar facet, medial branch nerve blocks to be performed  F/U surgical evaluation. May consider pending follow-up evaluations  F/U neurological evaluation. May consider pending follow-up evaluations  May consider radiofrequency rhizolysis or intraspinal procedures pending response to present treatment and F/U evaluation   Patient to call Pain Management Center should patient have concerns prior to scheduled return appointment.

## 2015-12-04 DIAGNOSIS — M7072 Other bursitis of hip, left hip: Secondary | ICD-10-CM | POA: Diagnosis not present

## 2015-12-04 DIAGNOSIS — M25552 Pain in left hip: Secondary | ICD-10-CM | POA: Diagnosis not present

## 2015-12-05 DIAGNOSIS — M549 Dorsalgia, unspecified: Secondary | ICD-10-CM | POA: Diagnosis not present

## 2015-12-05 DIAGNOSIS — I2510 Atherosclerotic heart disease of native coronary artery without angina pectoris: Secondary | ICD-10-CM | POA: Diagnosis not present

## 2015-12-05 DIAGNOSIS — J449 Chronic obstructive pulmonary disease, unspecified: Secondary | ICD-10-CM | POA: Diagnosis not present

## 2015-12-08 LAB — TOXASSURE SELECT 13 (MW), URINE: PDF: 0

## 2015-12-08 NOTE — Progress Notes (Signed)
Quick Note:  [employer] [place] Report Please ______

## 2015-12-16 DIAGNOSIS — M549 Dorsalgia, unspecified: Secondary | ICD-10-CM | POA: Diagnosis not present

## 2015-12-16 DIAGNOSIS — J449 Chronic obstructive pulmonary disease, unspecified: Secondary | ICD-10-CM | POA: Diagnosis not present

## 2015-12-16 DIAGNOSIS — I2510 Atherosclerotic heart disease of native coronary artery without angina pectoris: Secondary | ICD-10-CM | POA: Diagnosis not present

## 2015-12-18 ENCOUNTER — Encounter: Payer: Self-pay | Admitting: Pain Medicine

## 2015-12-18 ENCOUNTER — Ambulatory Visit: Payer: Medicare Other | Attending: Pain Medicine | Admitting: Pain Medicine

## 2015-12-18 ENCOUNTER — Other Ambulatory Visit: Payer: Self-pay | Admitting: Pain Medicine

## 2015-12-18 VITALS — BP 134/72 | HR 88 | Temp 98.2°F | Resp 18 | Ht 67.0 in | Wt 218.0 lb

## 2015-12-18 DIAGNOSIS — M5136 Other intervertebral disc degeneration, lumbar region: Secondary | ICD-10-CM | POA: Insufficient documentation

## 2015-12-18 DIAGNOSIS — M19011 Primary osteoarthritis, right shoulder: Secondary | ICD-10-CM | POA: Diagnosis not present

## 2015-12-18 DIAGNOSIS — M170 Bilateral primary osteoarthritis of knee: Secondary | ICD-10-CM | POA: Diagnosis not present

## 2015-12-18 DIAGNOSIS — M47816 Spondylosis without myelopathy or radiculopathy, lumbar region: Secondary | ICD-10-CM

## 2015-12-18 DIAGNOSIS — M533 Sacrococcygeal disorders, not elsewhere classified: Secondary | ICD-10-CM | POA: Diagnosis not present

## 2015-12-18 DIAGNOSIS — M5481 Occipital neuralgia: Secondary | ICD-10-CM | POA: Insufficient documentation

## 2015-12-18 DIAGNOSIS — M19012 Primary osteoarthritis, left shoulder: Secondary | ICD-10-CM

## 2015-12-18 DIAGNOSIS — E119 Type 2 diabetes mellitus without complications: Secondary | ICD-10-CM | POA: Diagnosis not present

## 2015-12-18 DIAGNOSIS — M5030 Other cervical disc degeneration, unspecified cervical region: Secondary | ICD-10-CM | POA: Diagnosis not present

## 2015-12-18 DIAGNOSIS — Z9889 Other specified postprocedural states: Secondary | ICD-10-CM | POA: Insufficient documentation

## 2015-12-18 DIAGNOSIS — M545 Low back pain: Secondary | ICD-10-CM | POA: Diagnosis present

## 2015-12-18 DIAGNOSIS — M79606 Pain in leg, unspecified: Secondary | ICD-10-CM | POA: Diagnosis present

## 2015-12-18 DIAGNOSIS — E1340 Other specified diabetes mellitus with diabetic neuropathy, unspecified: Secondary | ICD-10-CM | POA: Diagnosis not present

## 2015-12-18 DIAGNOSIS — M5416 Radiculopathy, lumbar region: Secondary | ICD-10-CM | POA: Diagnosis not present

## 2015-12-18 MED ORDER — OXYCODONE-ACETAMINOPHEN 10-325 MG PO TABS: ORAL_TABLET | ORAL | Status: DC

## 2015-12-18 MED ORDER — LACTATED RINGERS IV SOLN: 1000.0000 mL | INTRAVENOUS | Status: DC

## 2015-12-18 MED ORDER — FENTANYL 100 MCG/HR TD PT72: MEDICATED_PATCH | TRANSDERMAL | Status: DC

## 2015-12-18 MED ORDER — ORPHENADRINE CITRATE 30 MG/ML IJ SOLN: 60.0000 mg | Freq: Once | INTRAMUSCULAR | Status: DC

## 2015-12-18 MED ORDER — BUPIVACAINE HCL (PF) 0.25 % IJ SOLN: 30.0000 mL | Freq: Once | INTRAMUSCULAR | Status: DC

## 2015-12-18 MED ORDER — MIDAZOLAM HCL 5 MG/5ML IJ SOLN: 5.0000 mg | Freq: Once | INTRAMUSCULAR | Status: DC

## 2015-12-18 MED ORDER — CEFAZOLIN IN D5W 1 GM/50ML IV SOLN: 1.0000 g | Freq: Once | INTRAVENOUS | Status: DC

## 2015-12-18 MED ORDER — FENTANYL CITRATE (PF) 100 MCG/2ML IJ SOLN: 100.0000 ug | Freq: Once | INTRAMUSCULAR | Status: DC

## 2015-12-18 MED ORDER — CEFUROXIME AXETIL 250 MG PO TABS: 250.0000 mg | ORAL_TABLET | Freq: Two times a day (BID) | ORAL | Status: DC

## 2015-12-18 MED ORDER — TRIAMCINOLONE ACETONIDE 40 MG/ML IJ SUSP: 40.0000 mg | Freq: Once | INTRAMUSCULAR | Status: DC

## 2015-12-18 NOTE — Progress Notes (Signed)
Subjective:    Patient ID: Herbert May, male    DOB: 01/24/1941, 75 y.o.   MRN: [id number]  HPI  The patient is a 75 year old gentleman who comes to pain management for further evaluation and treatment of pain involving the lower back and lower extremity region. The patient was to undergo lumbar facet, medial branch nerve blocks on today's visit. Decision was made to cancel procedure due to evidence of lesions of the lumbosacral region. We will have patient follow up with primary care physician Dr. Legrand Rams at this time and we will remain available to consider patient for lumbar facet, medial branch nerve, blocks at time return appointment pending resolution of the lesions of the lumbosacral region. The patient denied any significant pain in the region of the lesions which were in the mid line of the lumbar region. We discussed patient's condition and will remain available to consider modifications of treatment pending follow-up evaluation of patient. At the present time we will provide patient prescription of fentanyl patch with oxycodone acetaminophen. The patient will continue medications as previously prescribed and we will remain available to consider additional modifications of treatment regimen pending follow-up evaluation. The patient was cautioned regarding side effects of the medications which patient understood very well since he had been taking medication for significant period of time. We will proceed with lumbar facet medial branch nerve blocks at time return appointment pending resolution of the lesions of the lumbosacral region. All agreed to suggested treatment plan  Review of Systems     Objective:   Physical Exam  There was tenderness of the splenius capitis and occipitalis region a mild degree with mild tenderness of the cervical facet cervical paraspinal musculature region. Palpation over the acromioclavicular and glenohumeral joint regions reproduces minimal discomfort and  patient appeared to be with bilaterally equal grip strength. Palpation over the thoracic region was with no crepitus of the thoracic region noted. Palpation over the lumbar region lumbar facet region was attends to palpation of moderate degree with lateral bending rotation extension and palpation over the lumbar facets reproducing moderate discomfort. There was evidence of lesions of the lumbosacral region consisting of approximately 6 fascicular-appearing lesions of the midline of the lumbar region. Palpation of the PSIS and PII S regions reproduce moderate degree. Palpation over the greater trochanteric region iliotibial band region was with minimal tenderness to palpation. Straight leg raising was tolerates approximately 20 with questionably increased pain with dorsiflexion noted. EHL strength was decreased. There was negative clonus negative Homans. No abdominal tends to palpation of costovertebral tenderness noted      Assessment & Plan:    Degenerative disc disease lumbar spine  Status post lumbar surgery  Lumbar facet syndrome  Degenerative disc disease cervical spine  Status post cervical surgery  Cervical facet syndrome  Bilateral occipital neuralgia  Degenerative joint disease of knees  Status post surgery of knee  Degenerative joint disease of shoulder  Sacroiliac joint dysfunction  Diabetes mellitus     PLAN  Continue present medications oxycodone and fentanyl patch  CAUTION Medication can cause respiratory depression and cause you to stop breathing, cause excessive sedation, cause confusion and other side effects.  Exercise extreme caution when taking medication and call EMS or go to the Emergency Department immediately if you develop any of these symptoms   Lumbar facet, medial branch nerve, blocks to be performed at time of return appointment pending medical clearance by Dr. Legrand Rams and resolution of rash of the lumbar sacral region  F/U  PCP Dr. Rosita Fire   for evaliation of  BP, lesions of lumbosacral region, and general medical condition . We will need medical clearance from her primary care physician to perform lumbar facet, medial branch nerve, blocks  F/U surgical evaluation. May consider pending follow-up evaluations  F/U neurological evaluation. May consider pending follow-up evaluations  May consider radiofrequency rhizolysis or intraspinal procedures pending response to present treatment and F/U evaluation   Patient to call [facility] should patient have concerns prior to scheduled return appointment

## 2015-12-18 NOTE — Progress Notes (Signed)
Safety precautions to be maintained throughout the outpatient stay will include: orient to surroundings, keep bed in low position, maintain call bell within reach at all times, provide assistance with transfer out of bed and ambulation.  Spoke with Dr. Unice Bailey office. Last prescribed 09-23-15: Percocet 10/325mg  #20, one every 6 hours, Fentanyl patch 100 mcg #10, one every 72 hours. She no longer prescribes any pain meds. Percocet and Fentanyl were filled on 11-23-15.

## 2015-12-18 NOTE — Patient Instructions (Addendum)
PLAN  Continue present medications oxycodone and fentanyl patch  CAUTION Medication can cause respiratory depression and cause you to stop breathing, cause excessive sedation, cause confusion and other side effects.  Exercise extreme caution when taking medication and call EMS or go to the Emergency Department immediately if you develop any of these symptoms   Lumbar facet, medial branch nerve, blocks to be performed at time of return appointment pending medical clearance by Dr. Legrand Rams and resolution of rash of the lumbar sacral region  F/U PCP Dr. Rosita Fire  for evaliation of  BP, lesions of lumbosacral region, and general medical condition . We will need medical clearance from her primary care physician to perform lumbar facet, medial branch nerve, blocks  F/U surgical evaluation. May consider pending follow-up evaluations  F/U neurological evaluation. May consider pending follow-up evaluations  May consider radiofrequency rhizolysis or intraspinal procedures pending response to present treatment and F/U evaluation   Patient to call [facility] should patient have concerns prior to scheduled return appointment.Facet Blocks Patient Information  Description: The facets are joints in the spine between the vertebrae.  Like any joints in the body, facets can become irritated and painful.  Arthritis can also effect the facets.  By injecting steroids and local anesthetic in and around these joints, we can temporarily block the nerve supply to them.  Steroids act directly on irritated nerves and tissues to reduce selling and inflammation which often leads to decreased pain.  Facet blocks may be done anywhere along the spine from the neck to the low back depending upon the location of your pain.   After numbing the skin with local anesthetic (like Novocaine), a small needle is passed onto the facet joints under x-ray guidance.  You may experience a sensation of pressure while this is being  done.  The entire block usually lasts about 15-25 minutes.   Conditions which may be treated by facet blocks:   Low back/buttock pain  Neck/shoulder pain  Certain types of headaches  Preparation for the injection:  1. Do not eat any solid food or dairy products within 8 hours of your appointment. 2. You may drink clear liquid up to 3 hours before appointment.  Clear liquids include water, black coffee, juice or soda.  No milk or cream please. 3. You may take your regular medication, including pain medications, with a sip of water before your appointment.  Diabetics should hold regular insulin (if taken separately) and take 1/2 normal NPH dose the morning of the procedure.  Carry some sugar containing items with you to your appointment. 4. A driver must accompany you and be prepared to drive you home after your procedure. 5. Bring all your current medications with you. 6. An IV may be inserted and sedation may be given at the discretion of the physician. 7. A blood pressure cuff, EKG and other monitors will often be applied during the procedure.  Some patients may need to have extra oxygen administered for a short period. 8. You will be asked to provide medical information, including your allergies and medications, prior to the procedure.  We must know immediately if you are taking blood thinners (like Coumadin/Warfarin) or if you are allergic to IV iodine contrast (dye).  We must know if you could possible be pregnant.  Possible side-effects:   Bleeding from needle site  Infection (rare, may require surgery)  Nerve injury (rare)  Numbness & tingling (temporary)  Difficulty urinating (rare, temporary)  Spinal headache (a headache  worse with upright posture)  Light-headedness (temporary)  Pain at injection site (serveral days)  Decreased blood pressure (rare, temporary)  Weakness in arm/leg (temporary)  Pressure sensation in back/neck (temporary)   Call if you  experience:   Fever/chills associated with headache or increased back/neck pain  Headache worsened by an upright position  New onset, weakness or numbness of an extremity below the injection site  Hives or difficulty breathing (go to the emergency room)  Inflammation or drainage at the injection site(s)  Severe back/neck pain greater than usual  New symptoms which are concerning to you  Please note:  Although the local anesthetic injected can often make your back or neck feel good for several hours after the injection, the pain will likely return. It takes 3-7 days for steroids to work.  You may not notice any pain relief for at least one week.  If effective, we will often do a series of 2-3 injections spaced 3-6 weeks apart to maximally decrease your pain.  After the initial series, you may be a candidate for a more permanent nerve block of the facets.  If you have any questions, please call #[phone]) [facility]

## 2015-12-19 ENCOUNTER — Telehealth: Payer: Self-pay

## 2015-12-19 NOTE — Telephone Encounter (Signed)
Post procedure phone call.  Left message.  

## 2015-12-20 ENCOUNTER — Telehealth: Payer: Self-pay | Admitting: *Deleted

## 2015-12-20 ENCOUNTER — Other Ambulatory Visit: Payer: Self-pay | Admitting: Pain Medicine

## 2015-12-20 NOTE — Telephone Encounter (Signed)
Spoke with patient.  He states he is not getting the right number of pills.  States Dr Ace Gins gave him #120 and you are only giving him 10.  Patient states he has a return appointment in July.  Insformed him that he would need to discuss this at return visit.

## 2015-12-20 NOTE — Telephone Encounter (Signed)
Nurses I requested that nurse call patient's pharmacy to document previous prescriptions prior to prescribing fentanyl patch with oxycodone acetaminophen for breakthrough pain. After review of patient's prior prescriptions and the dates of refills of the medications, decision was made to prescribe 20 oxycodone acetaminophen tabs at this time.. Please let me know if we need to provide additional information and explanation for patient regarding this issue. Thank you

## 2015-12-20 NOTE — Telephone Encounter (Signed)
Pt called lvm stating he has questions and would like for a nurse to give him a call...td

## 2015-12-23 ENCOUNTER — Telehealth: Payer: Self-pay | Admitting: *Deleted

## 2015-12-23 NOTE — Telephone Encounter (Signed)
Called to pharmacy to check fill dates Oxycodone/acetaminophen qty 120 filled 11/26/15 (Dr Ace Gins) Fentanyl patches also filled on that day (Dr Ace Gins)  Oxycodone/acetaminophen qty 20 filled today (Dr Primus Bravo).  Voicemail left for patient to call our office re; medication questions.

## 2015-12-24 ENCOUNTER — Telehealth: Payer: Self-pay | Admitting: *Deleted

## 2015-12-24 NOTE — Telephone Encounter (Signed)
Called to patient to let him that I have checked the pharmacy records and documented all fill dates with quantities and charted in the medication record in his chart.  Patient is going to be scheduled for July 7 with Dr Primus Bravo for medication management.

## 2015-12-24 NOTE — Telephone Encounter (Signed)
Spoke with patient to let him know that the qty ordered for oxycodone/acetaminophen is the amount he intended to prescribe.  Patient encouraged to schedule another appt if he desires to discuss medication management. Patient hung up on me without ending the conversation.

## 2015-12-24 NOTE — Telephone Encounter (Signed)
Lori Please discussed patient with me. As I previously explained to you. The nurse who was working with patient the day she receive his prescriptions call the pharmacy and researched all prescriptions the patient had received. We reviewed the patient's medications and decided the patient was not taking as much medication as he said he was taking therefore the patient was given a lesser amount of medication. This was based on the medications provided by the nurse who was working with patient that day. The nurse called the pharmacy and listed all patients medications for pain and the dates of refills and the quantities patient received. Therefore the patient was given a lesser amount of medications. Please discussed with me in further detail if you have any further questions are the patient has other concerns. Thank you

## 2016-01-03 ENCOUNTER — Encounter: Payer: Self-pay | Admitting: Pain Medicine

## 2016-01-03 ENCOUNTER — Ambulatory Visit: Payer: Medicare Other | Attending: Pain Medicine | Admitting: Pain Medicine

## 2016-01-03 VITALS — BP 97/71 | HR 90 | Temp 98.0°F | Resp 20 | Ht 67.0 in | Wt 218.0 lb

## 2016-01-03 DIAGNOSIS — M19012 Primary osteoarthritis, left shoulder: Secondary | ICD-10-CM

## 2016-01-03 DIAGNOSIS — M545 Low back pain: Secondary | ICD-10-CM | POA: Diagnosis present

## 2016-01-03 DIAGNOSIS — M47816 Spondylosis without myelopathy or radiculopathy, lumbar region: Secondary | ICD-10-CM

## 2016-01-03 DIAGNOSIS — M5481 Occipital neuralgia: Secondary | ICD-10-CM | POA: Diagnosis not present

## 2016-01-03 DIAGNOSIS — M170 Bilateral primary osteoarthritis of knee: Secondary | ICD-10-CM | POA: Diagnosis not present

## 2016-01-03 DIAGNOSIS — M79606 Pain in leg, unspecified: Secondary | ICD-10-CM | POA: Diagnosis present

## 2016-01-03 DIAGNOSIS — Z9889 Other specified postprocedural states: Secondary | ICD-10-CM | POA: Insufficient documentation

## 2016-01-03 DIAGNOSIS — M5030 Other cervical disc degeneration, unspecified cervical region: Secondary | ICD-10-CM | POA: Diagnosis not present

## 2016-01-03 DIAGNOSIS — M19019 Primary osteoarthritis, unspecified shoulder: Secondary | ICD-10-CM | POA: Diagnosis not present

## 2016-01-03 DIAGNOSIS — E1340 Other specified diabetes mellitus with diabetic neuropathy, unspecified: Secondary | ICD-10-CM | POA: Diagnosis not present

## 2016-01-03 DIAGNOSIS — M19011 Primary osteoarthritis, right shoulder: Secondary | ICD-10-CM | POA: Diagnosis not present

## 2016-01-03 DIAGNOSIS — M5136 Other intervertebral disc degeneration, lumbar region: Secondary | ICD-10-CM

## 2016-01-03 DIAGNOSIS — M5416 Radiculopathy, lumbar region: Secondary | ICD-10-CM | POA: Diagnosis not present

## 2016-01-03 MED ORDER — FENTANYL 100 MCG/HR TD PT72: MEDICATED_PATCH | TRANSDERMAL | Status: DC

## 2016-01-03 MED ORDER — OXYCODONE-ACETAMINOPHEN 10-325 MG PO TABS: ORAL_TABLET | ORAL | Status: DC

## 2016-01-03 NOTE — Progress Notes (Signed)
   Subjective:    Patient ID: Herbert May, male    DOB: [birth year], 75 y.o.   MRN: [id number]  HPI  The patient is a 75 year old gentleman who returns to pain management for further evaluation and treatment of pain involving the region of the lower back and lower extremity region. The patient is with pain which she states is aggravated by standing walking twisting turning maneuvers. The patient is a significant pain which is felt to be due to significant component of lumbar facet syndrome. We discussed patient's condition and will proceed with performing lumbar facet, medial branch nerve, blocks at time return appointment. The patient will continue fentanyl patch with oxycodone acetaminophen for breakthrough pain as discussed. The patient denies any recent trauma change in events of daily living the call significant change in symptomatology. The patient states that his primary care physician Dr. Legrand Rams has cleared him to undergo lumbar facet, medial branch nerve, blocks with local anesthetic and steroid. We will proceed with procedure time return appointment and continue medications as prescribed. All agreed to suggested treatment plan.     Review of Systems     Objective:   Physical Exam  There was tenderness to palpation of the paraspinal musculature in the cervical region cervical facet region palpation which be produced pain of mild-to-moderate degree with mild to moderate tenderness over the region of the thoracic facet thoracic paraspinal musculature region. Palpation of the acromioclavicular and glenohumeral joint regions reproduce moderate discomfort and patient was able to perform drop test with mild difficulty. The patient was with bilaterally equal grip strength with Tinel and Phalen's maneuver reproducing minimal discomfort. Palpation over the lumbar region lumbar facet region was with moderate to severe discomfort with lateral bending rotation extension and palpation of the lumbar  facets reproducing moderately severe discomfort. Palpation over the PSIS and PII S regions reproduce moderate discomfort. Straight leg raise was limited to 20 without an increase of pain with dorsiflexion noted. There was crepitus of the knees with negative anterior and posterior drawer signs without ballottement of the patella. EHL strength was decreased. No sensory deficit or dermatomal dystrophy detected. There was negative clonus negative Homans. Abdomen nontender with no costovertebral tenderness noted    Assessment & Plan:      Degenerative disc disease lumbar spine  Status post lumbar surgery  Lumbar facet syndrome  Degenerative disc disease cervical spine  Status post cervical surgery  Cervical facet syndrome  Bilateral occipital neuralgia  Degenerative joint disease of knees  Status post surgery of knee  Degenerative joint disease of shoulder     PLAN  Continue present medications oxycodone acetaminophen and fentanyl patch  Lumbar facet, medial branch nerve blocks, to be performed at time return appointment  F/U PCP Dr. Rosita Fire  for evaliation of  BP and general medical condition . We will need medical clearance from her primary care physician to perform lumbar facet, medial branch nerve, blocks  F/U surgical evaluation. May consider pending follow-up evaluations  F/U neurological evaluation. May consider pending follow-up evaluations  May consider radiofrequency rhizolysis or intraspinal procedures pending response to present treatment and F/U evaluation   Patient to call [facility] should patient have concerns prior to scheduled return appointment.

## 2016-01-03 NOTE — Progress Notes (Signed)
Safety precautions to be maintained throughout the outpatient stay will include: orient to surroundings, keep bed in low position, maintain call bell within reach at all times, provide assistance with transfer out of bed and ambulation.  

## 2016-01-03 NOTE — Patient Instructions (Addendum)
PLAN  Continue present medications oxycodone acetaminophen and fentanyl patch  Lumbar facet, medial branch nerve blocks, to be performed at time return appointment  F/U PCP Dr. Rosita Fire  for evaliation of  BP and general medical condition . We will need medical clearance from her primary care physician to perform lumbar facet, medial branch nerve, blocks  F/U surgical evaluation. May consider pending follow-up evaluations  F/U neurological evaluation. May consider pending follow-up evaluations  May consider radiofrequency rhizolysis or intraspinal procedures pending response to present treatment and F/U evaluation   Patient to call [facility] should patient have concerns prior to scheduled return appointment. GENERAL RISKS AND COMPLICATIONS  What are the risk, side effects and possible complications? Generally speaking, most procedures are safe.  However, with any procedure there are risks, side effects, and the possibility of complications.  The risks and complications are dependent upon the sites that are lesioned, or the type of nerve block to be performed.  The closer the procedure is to the spine, the more serious the risks are.  Great care is taken when placing the radio frequency needles, block needles or lesioning probes, but sometimes complications can occur. 1. Infection: Any time there is an injection through the skin, there is a risk of infection.  This is why sterile conditions are used for these blocks.  There are four possible types of infection. 1. Localized skin infection. 2. Central Nervous System Infection-This can be in the form of Meningitis, which can be deadly. 3. Epidural Infections-This can be in the form of an epidural abscess, which can cause pressure inside of the spine, causing compression of the spinal cord with subsequent paralysis. This would require an emergency surgery to decompress, and there are no guarantees that the patient would recover  from the paralysis. 4. Discitis-This is an infection of the intervertebral discs.  It occurs in about 1% of discography procedures.  It is difficult to treat and it may lead to surgery.        2. Pain: the needles have to go through skin and soft tissues, will cause soreness.       3. Damage to internal structures:  The nerves to be lesioned may be near blood vessels or    other nerves which can be potentially damaged.       4. Bleeding: Bleeding is more common if the patient is taking blood thinners such as  aspirin, Coumadin, Ticiid, Plavix, etc., or if he/she have some genetic predisposition  such as hemophilia. Bleeding into the spinal canal can cause compression of the spinal  cord with subsequent paralysis.  This would require an emergency surgery to  decompress and there are no guarantees that the patient would recover from the  paralysis.       5. Pneumothorax:  Puncturing of a lung is a possibility, every time a needle is introduced in  the area of the chest or upper back.  Pneumothorax refers to free air around the  collapsed lung(s), inside of the thoracic cavity (chest cavity).  Another two possible  complications related to a similar event would include: Hemothorax and Chylothorax.   These are variations of the Pneumothorax, where instead of air around the collapsed  lung(s), you may have blood or chyle, respectively.       6. Spinal headaches: They may occur with any procedures in the area of the spine.       7. Persistent CSF (Cerebro-Spinal Fluid) leakage: This is a rare  problem, but may occur  with prolonged intrathecal or epidural catheters either due to the formation of a fistulous  track or a dural tear.       8. Nerve damage: By working so close to the spinal cord, there is always a possibility of  nerve damage, which could be as serious as a permanent spinal cord injury with  paralysis.       9. Death:  Although rare, severe deadly allergic reactions known as "Anaphylactic  reaction"  can occur to any of the medications used.      10. Worsening of the symptoms:  We can always make thing worse.  What are the chances of something like this happening? Chances of any of this occuring are extremely low.  By statistics, you have more of a chance of getting killed in a motor vehicle accident: while driving to the hospital than any of the above occurring .  Nevertheless, you should be aware that they are possibilities.  In general, it is similar to taking a shower.  Everybody knows that you can slip, hit your head and get killed.  Does that mean that you should not shower again?  Nevertheless always keep in mind that statistics do not mean anything if you happen to be on the wrong side of them.  Even if a procedure has a 1 (one) in a 1,000,000 (million) chance of going wrong, it you happen to be that one..Also, keep in mind that by statistics, you have more of a chance of having something go wrong when taking medications.  Who should not have this procedure? If you are on a blood thinning medication (e.g. Coumadin, Plavix, see list of "Blood Thinners"), or if you have an active infection going on, you should not have the procedure.  If you are taking any blood thinners, please inform your physician.  How should I prepare for this procedure?  Do not eat or drink anything at least six hours prior to the procedure.  Bring a driver with you .  It cannot be a taxi.  Come accompanied by an adult that can drive you back, and that is strong enough to help you if your legs get weak or numb from the local anesthetic.  Take all of your medicines the morning of the procedure with just enough water to swallow them.  If you have diabetes, make sure that you are scheduled to have your procedure done first thing in the morning, whenever possible.  If you have diabetes, take only half of your insulin dose and notify our nurse that you have done so as soon as you arrive at the clinic.  If you are  diabetic, but only take blood sugar pills (oral hypoglycemic), then do not take them on the morning of your procedure.  You may take them after you have had the procedure.  Do not take aspirin or any aspirin-containing medications, at least eleven (11) days prior to the procedure.  They may prolong bleeding.  Wear loose fitting clothing that may be easy to take off and that you would not mind if it got stained with Betadine or blood.  Do not wear any jewelry or perfume  Remove any nail coloring.  It will interfere with some of our monitoring equipment.  NOTE: Remember that this is not meant to be interpreted as a complete list of all possible complications.  Unforeseen problems may occur.  BLOOD THINNERS The following drugs contain aspirin or other products, which can  cause increased bleeding during surgery and should not be taken for 2 weeks prior to and 1 week after surgery.  If you should need take something for relief of minor pain, you may take acetaminophen which is found in Tylenol,m Datril, Anacin-3 and Panadol. It is not blood thinner. The products listed below are.  Do not take any of the products listed below in addition to any listed on your instruction sheet.  A.P.C or A.P.C with Codeine Codeine Phosphate Capsules #3 Ibuprofen Ridaura  ABC compound Congesprin Imuran rimadil  Advil Cope Indocin Robaxisal  Alka-Seltzer Effervescent Pain Reliever and Antacid Coricidin or Coricidin-D  Indomethacin Rufen  Alka-Seltzer plus Cold Medicine Cosprin Ketoprofen S-A-C Tablets  Anacin Analgesic Tablets or Capsules Coumadin Korlgesic Salflex  Anacin Extra Strength Analgesic tablets or capsules CP-2 Tablets Lanoril Salicylate  Anaprox Cuprimine Capsules Levenox Salocol  Anexsia-D Dalteparin Magan Salsalate  Anodynos Darvon compound Magnesium Salicylate Sine-off  Ansaid Dasin Capsules Magsal Sodium Salicylate  Anturane Depen Capsules Marnal Soma  APF Arthritis pain formula Dewitt's Pills  Measurin Stanback  Argesic Dia-Gesic Meclofenamic Sulfinpyrazone  Arthritis Bayer Timed Release Aspirin Diclofenac Meclomen Sulindac  Arthritis pain formula Anacin Dicumarol Medipren Supac  Analgesic (Safety coated) Arthralgen Diffunasal Mefanamic Suprofen  Arthritis Strength Bufferin Dihydrocodeine Mepro Compound Suprol  Arthropan liquid Dopirydamole Methcarbomol with Aspirin Synalgos  ASA tablets/Enseals Disalcid Micrainin Tagament  Ascriptin Doan's Midol Talwin  Ascriptin A/D Dolene Mobidin Tanderil  Ascriptin Extra Strength Dolobid Moblgesic Ticlid  Ascriptin with Codeine Doloprin or Doloprin with Codeine Momentum Tolectin  Asperbuf Duoprin Mono-gesic Trendar  Aspergum Duradyne Motrin or Motrin IB Triminicin  Aspirin plain, buffered or enteric coated Durasal Myochrisine Trigesic  Aspirin Suppositories Easprin Nalfon Trillsate  Aspirin with Codeine Ecotrin Regular or Extra Strength Naprosyn Uracel  Atromid-S Efficin Naproxen Ursinus  Auranofin Capsules Elmiron Neocylate Vanquish  Axotal Emagrin Norgesic Verin  Azathioprine Empirin or Empirin with Codeine Normiflo Vitamin E  Azolid Emprazil Nuprin Voltaren  Bayer Aspirin plain, buffered or children's or timed BC Tablets or powders Encaprin Orgaran Warfarin Sodium  Buff-a-Comp Enoxaparin Orudis Zorpin  Buff-a-Comp with Codeine Equegesic Os-Cal-Gesic   Buffaprin Excedrin plain, buffered or Extra Strength Oxalid   Bufferin Arthritis Strength Feldene Oxphenbutazone   Bufferin plain or Extra Strength Feldene Capsules Oxycodone with Aspirin   Bufferin with Codeine Fenoprofen Fenoprofen Pabalate or Pabalate-SF   Buffets II Flogesic Panagesic   Buffinol plain or Extra Strength Florinal or Florinal with Codeine Panwarfarin   Buf-Tabs Flurbiprofen Penicillamine   Butalbital Compound Four-way cold tablets Penicillin   Butazolidin Fragmin Pepto-Bismol   Carbenicillin Geminisyn Percodan   Carna Arthritis Reliever Geopen Persantine    Carprofen Gold's salt Persistin   Chloramphenicol Goody's Phenylbutazone   Chloromycetin Haltrain Piroxlcam   Clmetidine heparin Plaquenil   Cllnoril Hyco-pap Ponstel   Clofibrate Hydroxy chloroquine Propoxyphen         Before stopping any of these medications, be sure to consult the physician who ordered them.  Some, such as Coumadin (Warfarin) are ordered to prevent or treat serious conditions such as "deep thrombosis", "pumonary embolisms", and other heart problems.  The amount of time that you may need off of the medication may also vary with the medication and the reason for which you were taking it.  If you are taking any of these medications, please make sure you notify your pain physician before you undergo any procedures.         Facet Joint Block The facet joints connect the bones of the spine (vertebrae).  They make it possible for you to bend, twist, and make other movements with your spine. They also prevent you from overbending, overtwisting, and making other excessive movements.  A facet joint block is a procedure where a numbing medicine (anesthetic) is injected into a facet joint. Often, a type of anti-inflammatory medicine called a steroid is also injected. A facet joint block may be done for two reasons:  2. Diagnosis. A facet joint block may be done as a test to see whether neck or back pain is caused by a worn-down or infected facet joint. If the pain gets better after a facet joint block, it means the pain is probably coming from the facet joint. If the pain does not get better, it means the pain is probably not coming from the facet joint.  3. Therapy. A facet joint block may be done to relieve neck or back pain caused by a facet joint. A facet joint block is only done as a therapy if the pain does not improve with medicine, exercise programs, physical therapy, and other forms of pain management. LET [facility] CARE PROVIDER KNOW ABOUT:   Any allergies you have.    All medicines you are taking, including vitamins, herbs, eyedrops, and over-the-counter medicines and creams.   Previous problems you or members of your family have had with the use of anesthetics.   Any blood disorders you have had.   Other health problems you have. RISKS AND COMPLICATIONS Generally, having a facet joint block is safe. However, as with any procedure, complications can occur. Possible complications associated with having a facet joint block include:   Bleeding.   Injury to a nerve near the injection site.   Pain at the injection site.   Weakness or numbness in areas controlled by nerves near the injection site.   Infection.   Temporary fluid retention.   Allergic reaction to anesthetics or medicines used during the procedure. BEFORE THE PROCEDURE   Follow your health care provider's instructions if you are taking dietary supplements or medicines. You may need to stop taking them or reduce your dosage.   Do not take any new dietary supplements or medicines without asking your health care provider first.   Follow your health care provider's instructions about eating and drinking before the procedure. You may need to stop eating and drinking several hours before the procedure.   Arrange to have an adult drive you home after the procedure. PROCEDURE 12. You may need to remove your clothing and dress in an open-back gown so that your health care provider can access your spine.  13. The procedure will be done while you are lying on an X-ray table. Most of the time you will be asked to lie on your stomach, but you may be asked to lie in a different position if an injection will be made in your neck.  14. Special machines will be used to monitor your oxygen levels, heart rate, and blood pressure.  15. If an injection will be made in your neck, an intravenous (IV) tube will be inserted into one of your veins. Fluids and medicine will flow directly into  your body through the IV tube.  16. The area over the facet joint where the injection will be made will be cleaned with an antiseptic soap. The surrounding skin will be covered with sterile drapes.  17. An anesthetic will be applied to your skin to make the injection area numb. You may feel a temporary stinging  or burning sensation.  18. A video X-ray machine will be used to locate the joint. A contrast dye may be injected into the facet joint area to help with locating the joint.  19. When the joint is located, an anesthetic medicine will be injected into the joint through the needle.  2. Your health care provider will ask you whether you feel pain relief. If you do feel relief, a steroid may be injected to provide pain relief for a longer period of time. If you do not feel relief or feel only partial relief, additional injections of an anesthetic may be made in other facet joints.  21. The needle will be removed, the skin will be cleansed, and bandages will be applied.  AFTER THE PROCEDURE   You will be observed for 15-30 minutes before being allowed to go home. Do not drive. Have an adult drive you or take a taxi or public transportation instead.   If you feel pain relief, the pain will return in several hours or days when the anesthetic wears off.   You may feel pain relief 2-14 days after the procedure. The amount of time this relief lasts varies from person to person.   It is normal to feel some tenderness over the injected area(s) for 2 days following the procedure.   If you have diabetes, you may have a temporary increase in blood sugar.   This information is not intended to replace advice given to you by your health care provider. Make sure you discuss any questions you have with your health care provider.   Document Released: 11/04/2006 Document Revised: 07/06/2014 Document Reviewed: 04/04/2012 Elsevier Interactive Patient Education [employer].

## 2016-01-15 ENCOUNTER — Ambulatory Visit: Payer: Medicare Other | Attending: Pain Medicine | Admitting: Pain Medicine

## 2016-01-15 ENCOUNTER — Encounter: Payer: Self-pay | Admitting: Pain Medicine

## 2016-01-15 VITALS — BP 112/58 | HR 93 | Temp 97.8°F | Resp 18 | Ht 67.0 in | Wt 215.0 lb

## 2016-01-15 DIAGNOSIS — M5136 Other intervertebral disc degeneration, lumbar region: Secondary | ICD-10-CM

## 2016-01-15 DIAGNOSIS — M19012 Primary osteoarthritis, left shoulder: Secondary | ICD-10-CM

## 2016-01-15 DIAGNOSIS — M47816 Spondylosis without myelopathy or radiculopathy, lumbar region: Secondary | ICD-10-CM

## 2016-01-15 DIAGNOSIS — M19011 Primary osteoarthritis, right shoulder: Secondary | ICD-10-CM

## 2016-01-15 DIAGNOSIS — M47817 Spondylosis without myelopathy or radiculopathy, lumbosacral region: Secondary | ICD-10-CM | POA: Diagnosis not present

## 2016-01-15 DIAGNOSIS — M8580 Other specified disorders of bone density and structure, unspecified site: Secondary | ICD-10-CM | POA: Diagnosis not present

## 2016-01-15 DIAGNOSIS — Z9889 Other specified postprocedural states: Secondary | ICD-10-CM | POA: Diagnosis not present

## 2016-01-15 DIAGNOSIS — Z981 Arthrodesis status: Secondary | ICD-10-CM | POA: Insufficient documentation

## 2016-01-15 DIAGNOSIS — I700 Atherosclerosis of aorta: Secondary | ICD-10-CM | POA: Diagnosis not present

## 2016-01-15 DIAGNOSIS — M79606 Pain in leg, unspecified: Secondary | ICD-10-CM | POA: Diagnosis present

## 2016-01-15 DIAGNOSIS — M545 Low back pain: Secondary | ICD-10-CM | POA: Insufficient documentation

## 2016-01-15 MED ORDER — TRIAMCINOLONE ACETONIDE 40 MG/ML IJ SUSP
40.0000 mg | Freq: Once | INTRAMUSCULAR | Status: AC
  Administered 2016-01-15: 40 mg
  Filled 2016-01-15: qty 1

## 2016-01-15 MED ORDER — BUPIVACAINE HCL (PF) 0.25 % IJ SOLN
30.0000 mL | Freq: Once | INTRAMUSCULAR | Status: AC
  Administered 2016-01-15: 30 mL
  Filled 2016-01-15: qty 30

## 2016-01-15 MED ORDER — SODIUM CHLORIDE 0.9% FLUSH
20.0000 mL | Freq: Once | INTRAVENOUS | Status: AC
  Administered 2016-01-15: 20 mL

## 2016-01-15 MED ORDER — CEFUROXIME AXETIL 250 MG PO TABS: 250.0000 mg | ORAL_TABLET | Freq: Two times a day (BID) | ORAL | Status: DC

## 2016-01-15 MED ORDER — FENTANYL CITRATE (PF) 100 MCG/2ML IJ SOLN
100.0000 ug | Freq: Once | INTRAMUSCULAR | Status: AC
  Administered 2016-01-15: 100 ug via INTRAVENOUS
  Filled 2016-01-15: qty 2

## 2016-01-15 MED ORDER — MIDAZOLAM HCL 5 MG/5ML IJ SOLN
5.0000 mg | Freq: Once | INTRAMUSCULAR | Status: AC
  Administered 2016-01-15: 3 mg via INTRAVENOUS
  Filled 2016-01-15: qty 5

## 2016-01-15 MED ORDER — CEFAZOLIN IN D5W 1 GM/50ML IV SOLN
1.0000 g | Freq: Once | INTRAVENOUS | Status: AC
  Administered 2016-01-15: 1 g via INTRAVENOUS

## 2016-01-15 MED ORDER — CEFAZOLIN SODIUM 1 G IJ SOLR
INTRAMUSCULAR | Status: AC
  Administered 2016-01-15: 14:00:00
  Filled 2016-01-15: qty 10

## 2016-01-15 MED ORDER — LACTATED RINGERS IV SOLN
1000.0000 mL | INTRAVENOUS | Status: DC
  Administered 2016-01-15: 1000 mL via INTRAVENOUS

## 2016-01-15 NOTE — Progress Notes (Signed)
Subjective:    Patient ID: Herbert May, male    DOB: 1940/07/19, 75 y.o.   MRN: [id number]  HPI  PROCEDURE PERFORMED: Lumbar facet (medial branch block)   NOTE: The patient is a 75 y.o. male who returns to [facility] for further evaluation and treatment of pain involving the lumbar and lower extremity region. Prior studies of the lumbar spine revealed the patient to be with evidence of posterior fusion of the lumbar spine at L2-3 L3-4 L4-5 vacuum disc present at L5-S1. Lucency suggested about the L5 pedicle screw raising the possibility of movement. 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 Wide laminectomies are noted at L2 L3 and L4. Osteopenia is noted atherosclerotic calcification present in the aorta without evidence of aneurysm. There is concern regarding significant component of patient's pain being due to facet syndrome The risks, benefits, and expectations of the procedure have been discussed and explained to the patient who was understanding and in agreement with suggested treatment plan. We will proceed with interventional treatment as discussed and as explained to the patient who was understanding and wished to proceed with procedure as planned.   DESCRIPTION OF PROCEDURE: Lumbar facet (medial branch block) with IV Versed, IV fentanyl conscious sedation, EKG, blood pressure, pulse, capnography, and pulse oximetry monitoring. The procedure was performed with the patient in the prone position. Betadine prep of proposed entry site performed.   NEEDLE PLACEMENT AT: Left L 1 lumbar facet (medial branch block). Under fluoroscopic guidance with oblique orientation of 15 degrees, a 22-gauge needle was inserted at the L 1 vertebral body level with needle placed at the targeted area of Burton's Eye or Eye of the Scotty Dog with documentation of needle placement in the superior and lateral border of targeted area of Burton's Eye or Eye of the Scotty Dog with oblique orientation of 15 degrees.  Following documentation of needle placement at the L 1 vertebral body level, needle placement was then accomplished at the L 2 vertebral body level.   NEEDLE PLACEMENT AT L2, L3, L4, and L5 VERTEBRAL BODY LEVELS ON THE LEFT SIDE The procedure was performed at the L2, L3, L4, and L5 vertebral body levels exactly as was performed at the L 1 vertebral body level utilizing the same technique and under fluoroscopic guidance.   Needle placement was then verified at all levels on lateral view. Following documentation of needle placement at all levels on lateral view and following negative aspiration for heme and CSF, each level was injected with 1 mL of 0.25% bupivacaine with Kenalog.     LUMBAR FACET, MEDIAL BRANCH NERVE, BLOCKS PERFORMED ON THE RIGHT SIDE   The procedure was performed on the right side exactly as was performed on the left side at the same levels and utilizing the same technique under fluoroscopic guidance.     The patient tolerated the procedure well. A total of 40 mg of Kenalog was utilized for the procedure.   PLAN:  1. Medications: The patient will continue presently prescribed medications. Oxycodone acetaminophen and fentanyl patch 2. May consider modification of treatment regimen at time of return appointment pending response to treatment rendered on today's visit. 3. The patient is to follow-up with primary care physician Dr. Legrand Rams for further evaluation of blood pressure and general medical condition status post steroid injection performed on today's visit. 4. Surgical follow-up evaluation.Has been addressed  5. Neurological follow-up evaluation.Has been addressed  6. The patient may be candidate for radiofrequency procedures, implantation type procedures, and other  treatment pending response to treatment and follow-up evaluation. 7. The patient has been advised to call the Pain Management Center prior to scheduled return appointment should there be significant change in  condition or should patient have other concerns regarding condition prior to scheduled return appointment.  The patient is understanding and in agreement with suggested treatment plan.   Review of Systems     Objective:   Physical Exam        Assessment & Plan:

## 2016-01-15 NOTE — Patient Instructions (Addendum)
PLAN  Continue present medications oxycodone acetaminophen and fentanyl patch. Please obtain Ceftin antibiotic today and begin taking Ceftin antibiotic today as prescribed  F/U PCP Dr. Rosita Fire  for evaluation of  BP and general medical condition . Marland Kitchen Please follow-up with Dr.Fanta regarding blood pressure and general medical condition this week as discussed status post steroid injection  F/U surgical evaluation. May consider pending follow-up evaluations  F/U neurological evaluation. May consider pending follow-up evaluations  May consider radiofrequency rhizolysis or intraspinal procedures pending response to present treatment and F/U evaluation   Patient to call [facility] should patient have concerns prior to scheduled return appointment.  Facet Blocks Patient Information  Description: The facets are joints in the spine between the vertebrae.  Like any joints in the body, facets can become irritated and painful.  Arthritis can also effect the facets.  By injecting steroids and local anesthetic in and around these joints, we can temporarily block the nerve supply to them.  Steroids act directly on irritated nerves and tissues to reduce selling and inflammation which often leads to decreased pain.  Facet blocks may be done anywhere along the spine from the neck to the low back depending upon the location of your pain.   After numbing the skin with local anesthetic (like Novocaine), a small needle is passed onto the facet joints under x-ray guidance.  You may experience a sensation of pressure while this is being done.  The entire block usually lasts about 15-25 minutes.   Conditions which may be treated by facet blocks:   Low back/buttock pain  Neck/shoulder pain  Certain types of headaches  Preparation for the injection:  1. Do not eat any solid food or dairy products within 8 hours of your appointment. 2. You may drink clear liquid up to 3 hours before appointment.   Clear liquids include water, black coffee, juice or soda.  No milk or cream please. 3. You may take your regular medication, including pain medications, with a sip of water before your appointment.  Diabetics should hold regular insulin (if taken separately) and take 1/2 normal NPH dose the morning of the procedure.  Carry some sugar containing items with you to your appointment. 4. A driver must accompany you and be prepared to drive you home after your procedure. 5. Bring all your current medications with you. 6. An IV may be inserted and sedation may be given at the discretion of the physician. 7. A blood pressure cuff, EKG and other monitors will often be applied during the procedure.  Some patients may need to have extra oxygen administered for a short period. 8. You will be asked to provide medical information, including your allergies and medications, prior to the procedure.  We must know immediately if you are taking blood thinners (like Coumadin/Warfarin) or if you are allergic to IV iodine contrast (dye).  We must know if you could possible be pregnant.  Possible side-effects:   Bleeding from needle site  Infection (rare, may require surgery)  Nerve injury (rare)  Numbness & tingling (temporary)  Difficulty urinating (rare, temporary)  Spinal headache (a headache worse with upright posture)  Light-headedness (temporary)  Pain at injection site (serveral days)  Decreased blood pressure (rare, temporary)  Weakness in arm/leg (temporary)  Pressure sensation in back/neck (temporary)   Call if you experience:   Fever/chills associated with headache or increased back/neck pain  Headache worsened by an upright position  New onset, weakness or numbness of an extremity  below the injection site  Hives or difficulty breathing (go to the emergency room)  Inflammation or drainage at the injection site(s)  Severe back/neck pain greater than usual  New symptoms which are  concerning to you  Please note:  Although the local anesthetic injected can often make your back or neck feel good for several hours after the injection, the pain will likely return. It takes 3-7 days for steroids to work.  You may not notice any pain relief for at least one week.  If effective, we will often do a series of 2-3 injections spaced 3-6 weeks apart to maximally decrease your pain.  After the initial series, you may be a candidate for a more permanent nerve block of the facets.  If you have any questions, please call #[phone] [facility]  Be careful moving about. Muscle spasms in the area of the injection may occur.  Use ice for the next 24 hours (15 minutes on, 15 minutes off).  After 24 hours, you may use heat for comfort if you wish.  Post procedure numbness or redness is expected, average 4-6 hours.  If numbness develops after 4-6 hours and is felt to be progressing and worsening, immediately contact your physician.

## 2016-01-15 NOTE — Progress Notes (Signed)
Safety precautions to be maintained throughout the outpatient stay will include: orient to surroundings, keep bed in low position, maintain call bell within reach at all times, provide assistance with transfer out of bed and ambulation.  

## 2016-01-16 ENCOUNTER — Telehealth: Payer: Self-pay | Admitting: *Deleted

## 2016-01-16 NOTE — Telephone Encounter (Signed)
Spoke with patient's wife, he is asleep.Instructed to have him call if any problems.

## 2016-01-22 DIAGNOSIS — E784 Other hyperlipidemia: Secondary | ICD-10-CM | POA: Diagnosis not present

## 2016-01-22 DIAGNOSIS — M549 Dorsalgia, unspecified: Secondary | ICD-10-CM | POA: Diagnosis not present

## 2016-01-22 DIAGNOSIS — I2510 Atherosclerotic heart disease of native coronary artery without angina pectoris: Secondary | ICD-10-CM | POA: Diagnosis not present

## 2016-01-22 DIAGNOSIS — J449 Chronic obstructive pulmonary disease, unspecified: Secondary | ICD-10-CM | POA: Diagnosis not present

## 2016-02-03 DIAGNOSIS — M7062 Trochanteric bursitis, left hip: Secondary | ICD-10-CM | POA: Diagnosis not present

## 2016-02-04 ENCOUNTER — Encounter: Payer: Self-pay | Admitting: Pain Medicine

## 2016-02-04 ENCOUNTER — Ambulatory Visit: Payer: Medicare Other | Attending: Pain Medicine | Admitting: Pain Medicine

## 2016-02-04 VITALS — BP 129/86 | HR 88 | Temp 98.2°F | Resp 16 | Ht 67.0 in | Wt 210.0 lb

## 2016-02-04 DIAGNOSIS — M47817 Spondylosis without myelopathy or radiculopathy, lumbosacral region: Secondary | ICD-10-CM | POA: Diagnosis not present

## 2016-02-04 DIAGNOSIS — M5481 Occipital neuralgia: Secondary | ICD-10-CM | POA: Insufficient documentation

## 2016-02-04 DIAGNOSIS — M170 Bilateral primary osteoarthritis of knee: Secondary | ICD-10-CM | POA: Diagnosis not present

## 2016-02-04 DIAGNOSIS — Z9889 Other specified postprocedural states: Secondary | ICD-10-CM | POA: Insufficient documentation

## 2016-02-04 DIAGNOSIS — M5030 Other cervical disc degeneration, unspecified cervical region: Secondary | ICD-10-CM | POA: Diagnosis not present

## 2016-02-04 DIAGNOSIS — M791 Myalgia: Secondary | ICD-10-CM | POA: Diagnosis not present

## 2016-02-04 DIAGNOSIS — M19019 Primary osteoarthritis, unspecified shoulder: Secondary | ICD-10-CM | POA: Insufficient documentation

## 2016-02-04 DIAGNOSIS — M1712 Unilateral primary osteoarthritis, left knee: Secondary | ICD-10-CM

## 2016-02-04 DIAGNOSIS — M19011 Primary osteoarthritis, right shoulder: Secondary | ICD-10-CM

## 2016-02-04 DIAGNOSIS — M47816 Spondylosis without myelopathy or radiculopathy, lumbar region: Secondary | ICD-10-CM

## 2016-02-04 DIAGNOSIS — M79606 Pain in leg, unspecified: Secondary | ICD-10-CM | POA: Diagnosis present

## 2016-02-04 DIAGNOSIS — M19012 Primary osteoarthritis, left shoulder: Secondary | ICD-10-CM

## 2016-02-04 DIAGNOSIS — M5136 Other intervertebral disc degeneration, lumbar region: Secondary | ICD-10-CM | POA: Diagnosis not present

## 2016-02-04 DIAGNOSIS — M533 Sacrococcygeal disorders, not elsewhere classified: Secondary | ICD-10-CM | POA: Diagnosis not present

## 2016-02-04 DIAGNOSIS — M545 Low back pain: Secondary | ICD-10-CM | POA: Diagnosis present

## 2016-02-04 DIAGNOSIS — M5416 Radiculopathy, lumbar region: Secondary | ICD-10-CM | POA: Diagnosis not present

## 2016-02-04 MED ORDER — DICLOFENAC SODIUM 1 % TD GEL
4.0000 g | Freq: Four times a day (QID) | TRANSDERMAL | Status: DC
  Filled 2016-02-04: qty 100

## 2016-02-04 MED ORDER — FENTANYL 100 MCG/HR TD PT72: MEDICATED_PATCH | TRANSDERMAL | 0 refills | Status: DC

## 2016-02-04 MED ORDER — OXYCODONE-ACETAMINOPHEN 10-325 MG PO TABS: ORAL_TABLET | ORAL | 0 refills | Status: DC

## 2016-02-04 NOTE — Patient Instructions (Addendum)
PLAN  Continue present medications oxycodone acetaminophen and fentanyl patch and BEGIN Voltaren Gel applications to the knee as discussed  Genicular nerve blocks of left knee to be performed at time of return appointment  F/U PCP Dr. Rosita Fire  for evaliation of  BP and general medical condition .  F/U surgical evaluation. May consider pending follow-up evaluations  F/U neurological evaluation. May consider pending follow-up evaluations  May consider radiofrequency rhizolysis or intraspinal procedures pending response to present treatment and F/U evaluation   Patient to call Pain Management Center should patient have concerns prior to scheduled return appointment.   Nerve Block Patient Information   Preparation for the injection:  1. Do not eat any solid food or dairy products within 8 hours of your appointment. 2. You may drink clear liquids up to 3 hours before appointment.  Clear liquids include water, black coffee, juice or soda.  No milk or cream please. 3. You may take your regular medication, including pain medications, with a sip of water before you appointment.  Diabetics should hold regular insulin (if taken separately) and take 1/2 normal NPH dose the morning of the procedure.  Carry some sugar containing items with you to your appointment. 4. A driver must accompany you and be prepared to drive you home after your procedure. 5. Bring all your current medications with you. 6. An IV may be inserted and sedation may be given at the discretion of the physician. 7. A blood pressure cuff, EKG, and other monitors will often be applied during the procedure.  Some patients may need to have extra oxygen administered for a short period. 8. You will be asked to provide medical information, including your allergies and medications, prior to the procedure.  We must know immediately if you are taking blood thinners (like Coumadin/Warfarin) or if you are allergic to IV iodine contrast  (dye).  We must know if you could possible be pregnant.    Possible side-effects:   Bleeding from needle site  Infection (rare, may require surgery)  Nerve injury (rare)  Pain at injection site (several days)  Decreased blood pressure (rare, temporary)  Seizure (very rare)  Call if you experience:   Hives or difficulty breathing ( go to the emergency room)  Inflammation or drainage at the injection site(s)  Please note:  Although the local anesthetic injected can often make your painful muscles feel good for several hours after the injection, the pain may return.  It takes 3-7 days for steroids to work.  You may not notice any pain relief for at least one week.  If effective, we will often do a series of injections spaced 3-6 weeks apart to maximally decrease your pain.  If you have any questions, please call [phone] [facility]

## 2016-02-04 NOTE — Progress Notes (Signed)
     The patient is a 75 year old gentleman who returns to pain management for further evaluation and treatment of pain involving the lower back and lower extremity region predominantly. The patient stated that he had improvement of pain of significant degree following lumbar facet, medial branch nerve blocks. The patient stated that his most involved some pain involving the knee. The patient stated that the left knee was a severe pain when standing walking and interfered with ability to obtain restful sleep as well. We discussed patient's condition and we will consider patient for modification of medications and decision was made to proceed with scheduling patient for genicular nerve blocks of the left knee to be performed at time of return appointment. All agreed to suggested treatment plan.    Physical examination  There was tends to palpation of the paraspinal muscular region cervical region cervical facet region a mild degree with mild tenderness of the acromioclavicular and glenohumeral joint region a mild tenderness over the thoracic facet thoracic paraspinal musculature region. Palpation of the region of the splenius capitate and occipitalis musculature region reproduced minimal discomfort as well. The patient was with mild difficulty attending to perform drop test. The patient appeared to be with bilaterally equal grip strength without significant increase of pain with Tinel and Phalen's maneuver. Palpation over the thoracic region was attends to palpation without crepitus of the thoracic region noted. Palpation over the lumbar region was associated with moderate increase of pain with lateral bending rotation extension and palpation over the lumbar facets reproducing moderate discomfort. There was well-healed surgical scar of the lumbar region without increased warmth and erythema in the region scar. Palpation over the PSIS and PII S region reproduces moderate discomfort. There was tends to  palpation of the knees left knee was with severe tends to palpation with increased pain with range of motion maneuvers of the knees with crepitus of the knee noted as well. There was no increased warmth and erythema in the region of the knee. EHL strength appeared to be decreased. No definite sensory deficit or dermatomal distribution detected. There was negative clonus negative Homans. Abdomen nontender with no costovertebral tenderness noted.     Assessment     Degenerative joint disease of knees  Status post surgery of left knee  Degenerative disc disease lumbar spine  Status post lumbar surgery  Lumbar facet syndrome  Degenerative disc disease cervical spine  Status post cervical surgery  Cervical facet syndrome  Bilateral occipital neuralgia  Degenerative joint disease of shoulder     PLAN  Continue present medications oxycodone acetaminophen and fentanyl patch and BEGIN Voltaren Gel applications to the knee as discussed  Genicular nerve blocks of left knee to be performed at time of return appointment  F/U PCP Dr. Rosita Fire  for evaliation of  BP and general medical condition .  F/U surgical evaluation. May consider pending follow-up evaluations  F/U neurological evaluation. May consider pending follow-up evaluations  May consider radiofrequency rhizolysis or intraspinal procedures pending response to present treatment and F/U evaluation   Patient to call Pain Management Center should patient have concerns prior to scheduled return appointment.

## 2016-02-13 DIAGNOSIS — M549 Dorsalgia, unspecified: Secondary | ICD-10-CM | POA: Diagnosis not present

## 2016-02-13 DIAGNOSIS — J449 Chronic obstructive pulmonary disease, unspecified: Secondary | ICD-10-CM | POA: Diagnosis not present

## 2016-02-13 DIAGNOSIS — M1990 Unspecified osteoarthritis, unspecified site: Secondary | ICD-10-CM | POA: Diagnosis not present

## 2016-02-13 DIAGNOSIS — I2510 Atherosclerotic heart disease of native coronary artery without angina pectoris: Secondary | ICD-10-CM | POA: Diagnosis not present

## 2016-02-26 ENCOUNTER — Ambulatory Visit: Payer: Medicare Other | Attending: Pain Medicine | Admitting: Pain Medicine

## 2016-02-26 VITALS — BP 142/78 | HR 81 | Temp 98.2°F | Resp 20 | Ht 67.0 in | Wt 210.0 lb

## 2016-02-26 DIAGNOSIS — Z96652 Presence of left artificial knee joint: Secondary | ICD-10-CM | POA: Diagnosis not present

## 2016-02-26 DIAGNOSIS — M19011 Primary osteoarthritis, right shoulder: Secondary | ICD-10-CM

## 2016-02-26 DIAGNOSIS — M179 Osteoarthritis of knee, unspecified: Secondary | ICD-10-CM | POA: Diagnosis not present

## 2016-02-26 DIAGNOSIS — M545 Low back pain: Secondary | ICD-10-CM | POA: Insufficient documentation

## 2016-02-26 DIAGNOSIS — M47816 Spondylosis without myelopathy or radiculopathy, lumbar region: Secondary | ICD-10-CM

## 2016-02-26 DIAGNOSIS — M19012 Primary osteoarthritis, left shoulder: Secondary | ICD-10-CM

## 2016-02-26 DIAGNOSIS — M5136 Other intervertebral disc degeneration, lumbar region: Secondary | ICD-10-CM

## 2016-02-26 DIAGNOSIS — M25562 Pain in left knee: Secondary | ICD-10-CM | POA: Diagnosis not present

## 2016-02-26 DIAGNOSIS — Z9889 Other specified postprocedural states: Secondary | ICD-10-CM

## 2016-02-26 DIAGNOSIS — M1712 Unilateral primary osteoarthritis, left knee: Secondary | ICD-10-CM

## 2016-02-26 MED ORDER — CEFAZOLIN IN D5W 1 GM/50ML IV SOLN
1.0000 g | Freq: Once | INTRAVENOUS | Status: AC
  Administered 2016-02-26: 1 g via INTRAVENOUS

## 2016-02-26 MED ORDER — LACTATED RINGERS IV SOLN
1000.0000 mL | INTRAVENOUS | Status: DC
  Administered 2016-02-26: 1000 mL via INTRAVENOUS

## 2016-02-26 MED ORDER — LIDOCAINE HCL (PF) 1 % IJ SOLN
10.0000 mL | Freq: Once | INTRAMUSCULAR | Status: AC
  Administered 2016-02-26: 10 mL via SUBCUTANEOUS
  Filled 2016-02-26: qty 10

## 2016-02-26 MED ORDER — ORPHENADRINE CITRATE 30 MG/ML IJ SOLN
60.0000 mg | Freq: Once | INTRAMUSCULAR | Status: AC
  Administered 2016-02-26: 60 mg via INTRAMUSCULAR
  Filled 2016-02-26: qty 2

## 2016-02-26 MED ORDER — FENTANYL 100 MCG/HR TD PT72: MEDICATED_PATCH | TRANSDERMAL | 0 refills | Status: DC

## 2016-02-26 MED ORDER — BUPIVACAINE HCL (PF) 0.25 % IJ SOLN
30.0000 mL | Freq: Once | INTRAMUSCULAR | Status: AC
  Administered 2016-02-26: 30 mL
  Filled 2016-02-26: qty 30

## 2016-02-26 MED ORDER — OXYCODONE-ACETAMINOPHEN 10-325 MG PO TABS: ORAL_TABLET | ORAL | 0 refills | Status: DC

## 2016-02-26 MED ORDER — FENTANYL CITRATE (PF) 100 MCG/2ML IJ SOLN
100.0000 ug | Freq: Once | INTRAMUSCULAR | Status: DC
  Filled 2016-02-26: qty 2

## 2016-02-26 MED ORDER — MIDAZOLAM HCL 5 MG/5ML IJ SOLN
5.0000 mg | Freq: Once | INTRAMUSCULAR | Status: DC
  Filled 2016-02-26: qty 5

## 2016-02-26 MED ORDER — CEFUROXIME AXETIL 250 MG PO TABS: 250.0000 mg | ORAL_TABLET | Freq: Two times a day (BID) | ORAL | 0 refills | Status: DC

## 2016-02-26 MED ORDER — CEFAZOLIN SODIUM 1 G IJ SOLR
INTRAMUSCULAR | Status: AC
Start: 2016-02-26 — End: 2016-02-26
  Administered 2016-02-26: 12:00:00
  Filled 2016-02-26: qty 10

## 2016-02-26 MED ORDER — TRIAMCINOLONE ACETONIDE 40 MG/ML IJ SUSP
40.0000 mg | Freq: Once | INTRAMUSCULAR | Status: AC
  Administered 2016-02-26: 40 mg
  Filled 2016-02-26: qty 1

## 2016-02-26 NOTE — Patient Instructions (Addendum)
PLAN  Continue present medications oxycodone acetaminophen and fentanyl patch. Please obtain Ceftin antibiotic today and begin taking Ceftin antibiotic today as prescribed  F/U PCP Dr. Rosita Fire  for evaluation of  BP and general medical condition . Marland Kitchen Please follow-up with Dr.Fanta regarding blood pressure and general medical condition this week as discussed status post steroid injection as we discussed today  F/U surgical evaluation. May consider pending follow-up evaluations  F/U neurological evaluation. May consider PNCV/EMG studies and other studies pending follow-up evaluations  May consider radiofrequency rhizolysis or intraspinal procedures pending response to present treatment and F/U evaluation   Patient to call Pain Management Center should patient have concerns prior to scheduled return appointment.Pain Management Discharge Instructions  General Discharge Instructions :  If you need to reach your doctor call: Monday-Friday 8:00 am - 4:00 pm at [phone] or toll free [phone].  After clinic hours [phone] to have operator reach doctor.  Bring all of your medication bottles to all your appointments in the pain clinic.  To cancel or reschedule your appointment with Pain Management please remember to call 24 hours in advance to avoid a fee.  Refer to the educational materials which you have been given on: General Risks, I had my Procedure. Discharge Instructions, Post Sedation.  Post Procedure Instructions:  The drugs you were given will stay in your system until tomorrow, so for the next 24 hours you should not drive, make any legal decisions or drink any alcoholic beverages.  You may eat anything you prefer, but it is better to start with liquids then soups and crackers, and gradually work up to solid foods.  Please notify your doctor immediately if you have any unusual bleeding, trouble breathing or pain that is not related to your normal pain.  Depending on  the type of procedure that was done, some parts of your body may feel week and/or numb.  This usually clears up by tonight or the next day.  Walk with the use of an assistive device or accompanied by an adult for the 24 hours.  You may use ice on the affected area for the first 24 hours.  Put ice in a Ziploc bag and cover with a towel and place against area 15 minutes on 15 minutes off.  You may switch to heat after 24 hours.GENERAL RISKS AND COMPLICATIONS  What are the risk, side effects and possible complications? Generally speaking, most procedures are safe.  However, with any procedure there are risks, side effects, and the possibility of complications.  The risks and complications are dependent upon the sites that are lesioned, or the type of nerve block to be performed.  The closer the procedure is to the spine, the more serious the risks are.  Great care is taken when placing the radio frequency needles, block needles or lesioning probes, but sometimes complications can occur. 1. Infection: Any time there is an injection through the skin, there is a risk of infection.  This is why sterile conditions are used for these blocks.  There are four possible types of infection. 1. Localized skin infection. 2. Central Nervous System Infection-This can be in the form of Meningitis, which can be deadly. 3. Epidural Infections-This can be in the form of an epidural abscess, which can cause pressure inside of the spine, causing compression of the spinal cord with subsequent paralysis. This would require an emergency surgery to decompress, and there are no guarantees that the patient would recover from the paralysis. 4. Discitis-This is  an infection of the intervertebral discs.  It occurs in about 1% of discography procedures.  It is difficult to treat and it may lead to surgery.        2. Pain: the needles have to go through skin and soft tissues, will cause soreness.       3. Damage to internal structures:   The nerves to be lesioned may be near blood vessels or    other nerves which can be potentially damaged.       4. Bleeding: Bleeding is more common if the patient is taking blood thinners such as  aspirin, Coumadin, Ticiid, Plavix, etc., or if he/she have some genetic predisposition  such as hemophilia. Bleeding into the spinal canal can cause compression of the spinal  cord with subsequent paralysis.  This would require an emergency surgery to  decompress and there are no guarantees that the patient would recover from the  paralysis.       5. Pneumothorax:  Puncturing of a lung is a possibility, every time a needle is introduced in  the area of the chest or upper back.  Pneumothorax refers to free air around the  collapsed lung(s), inside of the thoracic cavity (chest cavity).  Another two possible  complications related to a similar event would include: Hemothorax and Chylothorax.   These are variations of the Pneumothorax, where instead of air around the collapsed  lung(s), you may have blood or chyle, respectively.       6. Spinal headaches: They may occur with any procedures in the area of the spine.       7. Persistent CSF (Cerebro-Spinal Fluid) leakage: This is a rare problem, but may occur  with prolonged intrathecal or epidural catheters either due to the formation of a fistulous  track or a dural tear.       8. Nerve damage: By working so close to the spinal cord, there is always a possibility of  nerve damage, which could be as serious as a permanent spinal cord injury with  paralysis.       9. Death:  Although rare, severe deadly allergic reactions known as "Anaphylactic  reaction" can occur to any of the medications used.      10. Worsening of the symptoms:  We can always make thing worse.  What are the chances of something like this happening? Chances of any of this occuring are extremely low.  By statistics, you have more of a chance of getting killed in a motor vehicle accident: while  driving to the hospital than any of the above occurring .  Nevertheless, you should be aware that they are possibilities.  In general, it is similar to taking a shower.  Everybody knows that you can slip, hit your head and get killed.  Does that mean that you should not shower again?  Nevertheless always keep in mind that statistics do not mean anything if you happen to be on the wrong side of them.  Even if a procedure has a 1 (one) in a 1,000,000 (million) chance of going wrong, it you happen to be that one..Also, keep in mind that by statistics, you have more of a chance of having something go wrong when taking medications.  Who should not have this procedure? If you are on a blood thinning medication (e.g. Coumadin, Plavix, see list of "Blood Thinners"), or if you have an active infection going on, you should not have the procedure.  If you are taking  any blood thinners, please inform your physician.  How should I prepare for this procedure?  Do not eat or drink anything at least six hours prior to the procedure.  Bring a driver with you .  It cannot be a taxi.  Come accompanied by an adult that can drive you back, and that is strong enough to help you if your legs get weak or numb from the local anesthetic.  Take all of your medicines the morning of the procedure with just enough water to swallow them.  If you have diabetes, make sure that you are scheduled to have your procedure done first thing in the morning, whenever possible.  If you have diabetes, take only half of your insulin dose and notify our nurse that you have done so as soon as you arrive at the clinic.  If you are diabetic, but only take blood sugar pills (oral hypoglycemic), then do not take them on the morning of your procedure.  You may take them after you have had the procedure.  Do not take aspirin or any aspirin-containing medications, at least eleven (11) days prior to the procedure.  They may prolong bleeding.  Wear  loose fitting clothing that may be easy to take off and that you would not mind if it got stained with Betadine or blood.  Do not wear any jewelry or perfume  Remove any nail coloring.  It will interfere with some of our monitoring equipment.  NOTE: Remember that this is not meant to be interpreted as a complete list of all possible complications.  Unforeseen problems may occur.  BLOOD THINNERS The following drugs contain aspirin or other products, which can cause increased bleeding during surgery and should not be taken for 2 weeks prior to and 1 week after surgery.  If you should need take something for relief of minor pain, you may take acetaminophen which is found in Tylenol,m Datril, Anacin-3 and Panadol. It is not blood thinner. The products listed below are.  Do not take any of the products listed below in addition to any listed on your instruction sheet.  A.P.C or A.P.C with Codeine Codeine Phosphate Capsules #3 Ibuprofen Ridaura  ABC compound Congesprin Imuran rimadil  Advil Cope Indocin Robaxisal  Alka-Seltzer Effervescent Pain Reliever and Antacid Coricidin or Coricidin-D  Indomethacin Rufen  Alka-Seltzer plus Cold Medicine Cosprin Ketoprofen S-A-C Tablets  Anacin Analgesic Tablets or Capsules Coumadin Korlgesic Salflex  Anacin Extra Strength Analgesic tablets or capsules CP-2 Tablets Lanoril Salicylate  Anaprox Cuprimine Capsules Levenox Salocol  Anexsia-D Dalteparin Magan Salsalate  Anodynos Darvon compound Magnesium Salicylate Sine-off  Ansaid Dasin Capsules Magsal Sodium Salicylate  Anturane Depen Capsules Marnal Soma  APF Arthritis pain formula Dewitt's Pills Measurin Stanback  Argesic Dia-Gesic Meclofenamic Sulfinpyrazone  Arthritis Bayer Timed Release Aspirin Diclofenac Meclomen Sulindac  Arthritis pain formula Anacin Dicumarol Medipren Supac  Analgesic (Safety coated) Arthralgen Diffunasal Mefanamic Suprofen  Arthritis Strength Bufferin Dihydrocodeine Mepro Compound  Suprol  Arthropan liquid Dopirydamole Methcarbomol with Aspirin Synalgos  ASA tablets/Enseals Disalcid Micrainin Tagament  Ascriptin Doan's Midol Talwin  Ascriptin A/D Dolene Mobidin Tanderil  Ascriptin Extra Strength Dolobid Moblgesic Ticlid  Ascriptin with Codeine Doloprin or Doloprin with Codeine Momentum Tolectin  Asperbuf Duoprin Mono-gesic Trendar  Aspergum Duradyne Motrin or Motrin IB Triminicin  Aspirin plain, buffered or enteric coated Durasal Myochrisine Trigesic  Aspirin Suppositories Easprin Nalfon Trillsate  Aspirin with Codeine Ecotrin Regular or Extra Strength Naprosyn Uracel  Atromid-S Efficin Naproxen Ursinus  Auranofin Capsules Elmiron Neocylate  Vanquish  Axotal Emagrin Norgesic Verin  Azathioprine Empirin or Empirin with Codeine Normiflo Vitamin E  Azolid Emprazil Nuprin Voltaren  Bayer Aspirin plain, buffered or children's or timed BC Tablets or powders Encaprin Orgaran Warfarin Sodium  Buff-a-Comp Enoxaparin Orudis Zorpin  Buff-a-Comp with Codeine Equegesic Os-Cal-Gesic   Buffaprin Excedrin plain, buffered or Extra Strength Oxalid   Bufferin Arthritis Strength Feldene Oxphenbutazone   Bufferin plain or Extra Strength Feldene Capsules Oxycodone with Aspirin   Bufferin with Codeine Fenoprofen Fenoprofen Pabalate or Pabalate-SF   Buffets II Flogesic Panagesic   Buffinol plain or Extra Strength Florinal or Florinal with Codeine Panwarfarin   Buf-Tabs Flurbiprofen Penicillamine   Butalbital Compound Four-way cold tablets Penicillin   Butazolidin Fragmin Pepto-Bismol   Carbenicillin Geminisyn Percodan   Carna Arthritis Reliever Geopen Persantine   Carprofen Gold's salt Persistin   Chloramphenicol Goody's Phenylbutazone   Chloromycetin Haltrain Piroxlcam   Clmetidine heparin Plaquenil   Cllnoril Hyco-pap Ponstel   Clofibrate Hydroxy chloroquine Propoxyphen         Before stopping any of these medications, be sure to consult the physician who ordered them.  Some,  such as Coumadin (Warfarin) are ordered to prevent or treat serious conditions such as "deep thrombosis", "pumonary embolisms", and other heart problems.  The amount of time that you may need off of the medication may also vary with the medication and the reason for which you were taking it.  If you are taking any of these medications, please make sure you notify your pain physician before you undergo any procedures.  Prescriptions given for Fentanyl x2 and oxycodone x 2

## 2016-02-26 NOTE — Progress Notes (Signed)
     Genicular nerve blocks of the left knee   The patient is a 75 y.o. male who returns to the [facility] for further evaluation and treatment of pain involving the lumbar lower extremity region with severe pain of the left knee. . The patient has a history of degenerative joint disease and is status post left total knee replacement..  We will proceed with genicular nerve blocks of the left knee in an attempt to decrease severity of symptoms, minimize the risk of medication escalation, hopefully retard progression of symptoms and avoid the need for more involved treatment.  The risks benefits and expectations of the procedure were discussed with the patient. The patient was with understanding and in agreement with suggested treatment plan.  DESCRIPTION OF PROCEDURE: Genicular nerve blocks of the left knee. The  procedure was performed with IV Versed and IV fentanyl, conscious sedation and under fluoroscopic guidance.  NEEDLE PLACEMENT FOR BLOCK OF THE LATERAL SUPERIOR GENICULAR NERVE: The patient was taking to the fluoroscopy suite. With the patient supine, with knee in flexed position, Betadine prep of proposed entry site accomplished.  IV Versed, IV fentanyl conscious sedation, EKG, blood pressure, pulse, capnography, and pulse oximetry monitoring were all in place. Under fluoroscopic guidance, a 22-gauge needle was inserted in the region of the left knee with needle placed at the lateral border of the femur at the junction of the shaft of the femur and the condyle of the femur.  Following needle placement at the lateral aspect of the knee, needle placement was then accomplished in the region of the medial aspect of the knee.  NEEDLE PLACEMENT FOR BLOCK OF THE MEDIAL SUPERIOR GENICULAR NERVE:  Under fluoroscopic guidance, a 22 - gauge needle was inserted in the region of the left knee with needle placed at the medial border of the femur at the junction of the shaft of the femur and the  condyle of the femur.   NEEDLE PLACEMENT FOR BLOCK OF THE MEDIAL INFERIOR GENICULAR NERVE:  Under fluoroscopic guidance, a 22 - gauge needle was inserted in the region of the left knee with needle placed at the junction of the shaft and plateau of the tibia.   Following needle placement on AP view of needles placed in all three locations, placement was then verified on lateral view with the tips of the superior lateral and superior medial needles documented to be one half the distance of the shaft of the femur and the tip of the inferior medial geniculate needle documented to be one half the distance of the shaft of the tibia.  Following documentation of needle placements on lateral view, each needle was injected with one mL of 0.25% bupivacaine with Kenalog. A total of 10 mg of Kenalog was utilized for the entire procedure. The patient tolerated the procedure well.    PLAN 1. Medications: Continue present medications oxycodone acetaminophen and fentanyl patch 2. Follow-up appointment with PCP Dr. Legrand Rams for evaluation of blood pressure and general medical condition. 3. Follow-up surgical evaluation 4. Follow-up neurological evaluation 5. The patient may be a candidate for radiofrequency rhizolysis and other treatment pending response to treatment on today's visit and follow-up evaluation. 6. The patient is advised to adhere to proper body mechanics and avoid activities which appear to aggravate condition

## 2016-02-27 ENCOUNTER — Telehealth: Payer: Self-pay | Admitting: *Deleted

## 2016-02-27 ENCOUNTER — Telehealth: Payer: Self-pay

## 2016-02-27 NOTE — Telephone Encounter (Signed)
Voicemail left for patient to call our office if there are any questions or concerns from procedure on yesterday.

## 2016-02-27 NOTE — Telephone Encounter (Signed)
Pt says he was yesterday and Dr Primus Bravo wrote him scripts but they are with out a date and now pt can't get meds. Call pharmacy listed in pt chart ask for Tommy.

## 2016-03-03 ENCOUNTER — Telehealth: Payer: Self-pay | Admitting: *Deleted

## 2016-03-03 NOTE — Telephone Encounter (Signed)
Spoke with pharmacy about patients concern that there is no date on his Rx and that he could not fill.  Clarification by pharmacy is that Mr Herbert May was trying to fill the Rx early and it was before the 30 days were up from his last fill on his oxycodone 10-325 mg.  No authorization given to fill Rx early.

## 2016-03-10 ENCOUNTER — Telehealth: Payer: Self-pay | Admitting: *Deleted

## 2016-03-18 ENCOUNTER — Encounter: Payer: Self-pay | Admitting: *Deleted

## 2016-03-19 ENCOUNTER — Ambulatory Visit (INDEPENDENT_AMBULATORY_CARE_PROVIDER_SITE_OTHER): Payer: Medicare Other | Admitting: Cardiovascular Disease

## 2016-03-19 ENCOUNTER — Encounter: Payer: Self-pay | Admitting: Cardiovascular Disease

## 2016-03-19 VITALS — BP 118/66 | HR 60 | Ht 67.0 in | Wt 215.0 lb

## 2016-03-19 DIAGNOSIS — E785 Hyperlipidemia, unspecified: Secondary | ICD-10-CM

## 2016-03-19 DIAGNOSIS — I25768 Atherosclerosis of bypass graft of coronary artery of transplanted heart with other forms of angina pectoris: Secondary | ICD-10-CM | POA: Diagnosis not present

## 2016-03-19 DIAGNOSIS — Z9861 Coronary angioplasty status: Secondary | ICD-10-CM

## 2016-03-19 DIAGNOSIS — Z955 Presence of coronary angioplasty implant and graft: Secondary | ICD-10-CM | POA: Diagnosis not present

## 2016-03-19 DIAGNOSIS — R079 Chest pain, unspecified: Secondary | ICD-10-CM

## 2016-03-19 DIAGNOSIS — I10 Essential (primary) hypertension: Secondary | ICD-10-CM

## 2016-03-19 DIAGNOSIS — I358 Other nonrheumatic aortic valve disorders: Secondary | ICD-10-CM

## 2016-03-19 NOTE — Patient Instructions (Signed)

## 2016-03-19 NOTE — Progress Notes (Signed)
SUBJECTIVE: Pt follows up for CAD. Since his last visit with me he has had 2 episodes of chest pain, each relieved with one nitroglycerin tablet.  Nuclear stress test 09/09/15 demonstrated a non-reversible defect consistent with artifact. There was no reported ischemia. LVEF 62%. However, it was deemed an intermediate risk study.  Echocardiogram 09/04/15 demonstrated normal left ventricular systolic function, EF 0000000, indeterminate diastolic function and aortic valve sclerosis without stenosis.   Review of Systems: As per "subjective", otherwise negative.  Allergies  Allergen Reactions  . Aspirin Hives and Itching  . Fish Allergy Nausea And Vomiting  . Latex Other (See Comments)    Tears Skin   . Onion Nausea And Vomiting  . Other Other (See Comments)    Just doesn't like [place]   . Levaquin [Levofloxacin In D5w] Rash  . Neosporin [Neomycin-Bacitracin Zn-Polymyx] Rash    Current Outpatient Prescriptions  Medication Sig Dispense Refill  . albuterol (PROAIR HFA) 108 (90 BASE) MCG/ACT inhaler Inhale 1 puff into the lungs every 4 (four) hours as needed for wheezing or shortness of breath. 1 Inhaler 12  . albuterol (PROVENTIL) (2.5 MG/3ML) 0.083% nebulizer solution INHALE CONTENTS OF 1 VIAL IN NEBULIZER EVERY 6 HOURS AS NEEDED FOR FOR WHEEZING OR SHORTNESS OF BREATH. 360 mL 6  . diazepam (VALIUM) 5 MG tablet Take 5 mg by mouth daily.    Marland Kitchen diltiazem (CARDIZEM) 30 MG tablet Take 1 tablet (30 mg total) by mouth 2 (two) times daily. 30 tablet 11  . fentaNYL (DURAGESIC - DOSED MCG/HR) 100 MCG/HR Apply one patch to skin every 3 days if tolerated 10 patch 0  . fluticasone (FLONASE) 50 MCG/ACT nasal spray Place 2 sprays into both nostrils daily.    Marland Kitchen gabapentin (NEURONTIN) 300 MG capsule Take 1 capsule by mouth 3 (three) times daily.    Marland Kitchen ipratropium (ATROVENT) 0.02 % nebulizer solution INHALE CONTENTS OF 1 VIAL IN NEBULIZER EVERY 6 HOURS AS NEEDED FOR FOR WHEEZING AND SHORTNESS OF  BREATH. 300 mL 6  . metFORMIN (GLUCOPHAGE) 500 MG tablet Take 500 mg by mouth 2 (two) times daily with a meal.    . nitroGLYCERIN (NITROSTAT) 0.4 MG SL tablet DISSOLVE ONE TABLET UNDER TONGUE EVERY 5 MINUTES UP TO 3 DOSES AS NEEDED FOR CHEST PAIN 25 tablet 3  . oxyCODONE-acetaminophen (PERCOCET) 10-325 MG tablet Limit one tablet by mouth per day or 2-4 times per day for breakthrough pain while wearing fentanyl patch if tolerated 120 tablet 0  . pantoprazole (PROTONIX) 40 MG tablet Take 1 tablet (40 mg total) by mouth 2 (two) times daily. 60 tablet 11  . potassium chloride SA (K-DUR,KLOR-CON) 20 MEQ tablet Take 20 mEq by mouth daily.    . simvastatin (ZOCOR) 40 MG tablet TAKE ONE TABLET BY MOUTH DAILY AT 6 PM. 30 tablet 4  . traZODone (DESYREL) 100 MG tablet TAKE ONE TABLET BY MOUTH AT BEDTIME AS NEEDED FOR SLEEP (Patient taking differently: TAKE ONE TABLET BY MOUTH AT BEDTIME FOR SLEEP) 30 tablet 3   Current Facility-Administered Medications  Medication Dose Route Frequency Provider Last Rate Last Dose  . diclofenac sodium (VOLTAREN) 1 % transdermal gel 4 g  4 g Topical QID Mohammed Kindle, MD      . fentaNYL (SUBLIMAZE) injection 100 mcg  100 mcg Intravenous Once Mohammed Kindle, MD      . lactated ringers infusion 1,000 mL  1,000 mL Intravenous Continuous Mohammed Kindle, MD 125 mL/hr at 01/15/16 1349 1,000 mL at 01/15/16  1349  . lactated ringers infusion 1,000 mL  1,000 mL Intravenous Continuous Mohammed Kindle, MD 125 mL/hr at 02/26/16 1135 1,000 mL at 02/26/16 1135  . midazolam (VERSED) 5 MG/5ML injection 5 mg  5 mg Intravenous Once Mohammed Kindle, MD        Past Medical History:  Diagnosis Date  . Allergy   . Arthritis   . Asthma   . CAD (coronary artery disease) of artery bypass graft 12/27/2012  . Colostomy status ([place])   . COPD (chronic obstructive pulmonary disease) ([place])   . Diabetes mellitus without complication ([place])   . Emphysema of lung ([place])   . GERD (gastroesophageal reflux disease)    . Heart attack ([place])   . Hyperlipidemia   . Hypertension   . Oxygen deficiency     Past Surgical History:  Procedure Laterality Date  . BACK SURGERY    . cardiac stents    . COLOSTOMY    . COLOSTOMY CLOSURE    . HERNIA REPAIR    . KNEE SURGERY    . NOSE SURGERY      Social History   Social History  . Marital status: Married    Spouse name: N/A  . Number of children: N/A  . Years of education: N/A   Occupational History  . Not on file.   Social History Main Topics  . Smoking status: Former Smoker    Packs/day: 1.00    Years: 37.00    Types: Cigarettes    Start date: 12/30/1953    Quit date: 12/28/1990  . Smokeless tobacco: Former Systems developer    Quit date: 06/30/1983  . Alcohol use 0.0 oz/week     Comment: occasional  . Drug use: No  . Sexual activity: Not on file   Other Topics Concern  . Not on file   Social History Narrative  . No narrative on file     Vitals:   03/19/16 0957  BP: 118/66  Pulse: 60  SpO2: 91%  Weight: 215 lb (97.5 kg)  Height: 5\' 7"  (1.702 m)    PHYSICAL EXAM General: NAD HEENT: Normal. Neck: No JVD, no thyromegaly. Lungs: Clear to auscultation bilaterally with normal respiratory effort. CV: Nondisplaced PMI. Regular rate and rhythm, normal S1/S2, no [id number], soft systolic murmur over RUSB. Trace pretibial and periankle edema.  Abdomen: Obese.  Neurologic: Alert and oriented.  Psych: Normal affect. Skin: Normal. Musculoskeletal: No gross deformities.    ECG: Most recent ECG reviewed.      ASSESSMENT AND PLAN: 1. CAD s/p 3 stents and 2 MI's: Stable. His most recent cardiac catheterization from 02/16/2007 showed wide patency of the mid-LAD stent and at that time, he underwent successful stenting of the proximal LAD. There was insignificant disease in the LCx and RCA. He is allergic to ASA and reportedly intolerant to beta blockers. Continue simvastatin. Stress test without ischemia 09/09/15.  2. Aortic sclerosis: Echo  reviewed above. Sclerosis w/o stenosis.  3. Essential HTN: Controlled. No changes.  4. Dyslipidemia: Continue simvastatin.  Dispo: fu 6 months.   Kate Sable, M.D., F.A.C.C.

## 2016-04-02 DIAGNOSIS — M47817 Spondylosis without myelopathy or radiculopathy, lumbosacral region: Secondary | ICD-10-CM | POA: Diagnosis not present

## 2016-04-02 DIAGNOSIS — M179 Osteoarthritis of knee, unspecified: Secondary | ICD-10-CM | POA: Diagnosis not present

## 2016-04-02 DIAGNOSIS — M791 Myalgia: Secondary | ICD-10-CM | POA: Diagnosis not present

## 2016-04-02 DIAGNOSIS — M5416 Radiculopathy, lumbar region: Secondary | ICD-10-CM | POA: Diagnosis not present

## 2016-04-06 DIAGNOSIS — M7062 Trochanteric bursitis, left hip: Secondary | ICD-10-CM | POA: Diagnosis not present

## 2016-04-06 DIAGNOSIS — M25552 Pain in left hip: Secondary | ICD-10-CM | POA: Diagnosis not present

## 2016-04-16 DIAGNOSIS — E1165 Type 2 diabetes mellitus with hyperglycemia: Secondary | ICD-10-CM | POA: Diagnosis not present

## 2016-04-16 DIAGNOSIS — Z0000 Encounter for general adult medical examination without abnormal findings: Secondary | ICD-10-CM | POA: Diagnosis not present

## 2016-04-16 DIAGNOSIS — M549 Dorsalgia, unspecified: Secondary | ICD-10-CM | POA: Diagnosis not present

## 2016-04-16 DIAGNOSIS — E785 Hyperlipidemia, unspecified: Secondary | ICD-10-CM | POA: Diagnosis not present

## 2016-04-16 DIAGNOSIS — Z23 Encounter for immunization: Secondary | ICD-10-CM | POA: Diagnosis not present

## 2016-04-16 DIAGNOSIS — C61 Malignant neoplasm of prostate: Secondary | ICD-10-CM | POA: Diagnosis not present

## 2016-04-16 DIAGNOSIS — E784 Other hyperlipidemia: Secondary | ICD-10-CM | POA: Diagnosis not present

## 2016-04-16 DIAGNOSIS — J449 Chronic obstructive pulmonary disease, unspecified: Secondary | ICD-10-CM | POA: Diagnosis not present

## 2016-04-16 DIAGNOSIS — I2510 Atherosclerotic heart disease of native coronary artery without angina pectoris: Secondary | ICD-10-CM | POA: Diagnosis not present

## 2016-04-20 DIAGNOSIS — E784 Other hyperlipidemia: Secondary | ICD-10-CM | POA: Diagnosis not present

## 2016-04-20 DIAGNOSIS — J449 Chronic obstructive pulmonary disease, unspecified: Secondary | ICD-10-CM | POA: Diagnosis not present

## 2016-04-20 DIAGNOSIS — Z23 Encounter for immunization: Secondary | ICD-10-CM | POA: Diagnosis not present

## 2016-04-20 DIAGNOSIS — M549 Dorsalgia, unspecified: Secondary | ICD-10-CM | POA: Diagnosis not present

## 2016-04-20 DIAGNOSIS — I2510 Atherosclerotic heart disease of native coronary artery without angina pectoris: Secondary | ICD-10-CM | POA: Diagnosis not present

## 2016-04-23 ENCOUNTER — Other Ambulatory Visit: Payer: Self-pay | Admitting: Pain Medicine

## 2016-04-24 DIAGNOSIS — M5136 Other intervertebral disc degeneration, lumbar region: Secondary | ICD-10-CM | POA: Diagnosis not present

## 2016-04-24 DIAGNOSIS — M47817 Spondylosis without myelopathy or radiculopathy, lumbosacral region: Secondary | ICD-10-CM | POA: Diagnosis not present

## 2016-04-24 DIAGNOSIS — M79605 Pain in left leg: Secondary | ICD-10-CM | POA: Diagnosis not present

## 2016-04-24 DIAGNOSIS — M47816 Spondylosis without myelopathy or radiculopathy, lumbar region: Secondary | ICD-10-CM | POA: Diagnosis not present

## 2016-04-27 ENCOUNTER — Ambulatory Visit: Payer: Self-pay | Admitting: Pain Medicine

## 2016-05-04 DIAGNOSIS — M5416 Radiculopathy, lumbar region: Secondary | ICD-10-CM | POA: Diagnosis not present

## 2016-05-04 DIAGNOSIS — M179 Osteoarthritis of knee, unspecified: Secondary | ICD-10-CM | POA: Diagnosis not present

## 2016-05-04 DIAGNOSIS — M47817 Spondylosis without myelopathy or radiculopathy, lumbosacral region: Secondary | ICD-10-CM | POA: Diagnosis not present

## 2016-05-04 DIAGNOSIS — M533 Sacrococcygeal disorders, not elsewhere classified: Secondary | ICD-10-CM | POA: Diagnosis not present

## 2016-06-02 DIAGNOSIS — M47817 Spondylosis without myelopathy or radiculopathy, lumbosacral region: Secondary | ICD-10-CM | POA: Diagnosis not present

## 2016-06-02 DIAGNOSIS — M5416 Radiculopathy, lumbar region: Secondary | ICD-10-CM | POA: Diagnosis not present

## 2016-06-02 DIAGNOSIS — M791 Myalgia: Secondary | ICD-10-CM | POA: Diagnosis not present

## 2016-06-02 DIAGNOSIS — M179 Osteoarthritis of knee, unspecified: Secondary | ICD-10-CM | POA: Diagnosis not present

## 2016-06-08 DIAGNOSIS — J449 Chronic obstructive pulmonary disease, unspecified: Secondary | ICD-10-CM | POA: Diagnosis not present

## 2016-06-08 DIAGNOSIS — E785 Hyperlipidemia, unspecified: Secondary | ICD-10-CM | POA: Diagnosis not present

## 2016-06-08 DIAGNOSIS — Z0000 Encounter for general adult medical examination without abnormal findings: Secondary | ICD-10-CM | POA: Diagnosis not present

## 2016-06-08 DIAGNOSIS — C61 Malignant neoplasm of prostate: Secondary | ICD-10-CM | POA: Diagnosis not present

## 2016-06-08 DIAGNOSIS — E1165 Type 2 diabetes mellitus with hyperglycemia: Secondary | ICD-10-CM | POA: Diagnosis not present

## 2016-06-12 ENCOUNTER — Other Ambulatory Visit: Payer: Self-pay | Admitting: Internal Medicine

## 2016-06-17 DIAGNOSIS — J449 Chronic obstructive pulmonary disease, unspecified: Secondary | ICD-10-CM | POA: Diagnosis not present

## 2016-06-17 DIAGNOSIS — E785 Hyperlipidemia, unspecified: Secondary | ICD-10-CM | POA: Diagnosis not present

## 2016-06-17 DIAGNOSIS — E1165 Type 2 diabetes mellitus with hyperglycemia: Secondary | ICD-10-CM | POA: Diagnosis not present

## 2016-06-17 DIAGNOSIS — Z0000 Encounter for general adult medical examination without abnormal findings: Secondary | ICD-10-CM | POA: Diagnosis not present

## 2016-06-17 DIAGNOSIS — C61 Malignant neoplasm of prostate: Secondary | ICD-10-CM | POA: Diagnosis not present

## 2016-07-06 ENCOUNTER — Other Ambulatory Visit: Payer: Self-pay | Admitting: Pain Medicine

## 2016-07-06 DIAGNOSIS — M5416 Radiculopathy, lumbar region: Secondary | ICD-10-CM | POA: Diagnosis not present

## 2016-07-06 DIAGNOSIS — M179 Osteoarthritis of knee, unspecified: Secondary | ICD-10-CM | POA: Diagnosis not present

## 2016-07-06 DIAGNOSIS — M791 Myalgia: Secondary | ICD-10-CM | POA: Diagnosis not present

## 2016-07-06 DIAGNOSIS — M47817 Spondylosis without myelopathy or radiculopathy, lumbosacral region: Secondary | ICD-10-CM | POA: Diagnosis not present

## 2016-07-12 DIAGNOSIS — R05 Cough: Secondary | ICD-10-CM | POA: Diagnosis not present

## 2016-07-12 DIAGNOSIS — R0682 Tachypnea, not elsewhere classified: Secondary | ICD-10-CM | POA: Diagnosis not present

## 2016-07-12 DIAGNOSIS — I482 Chronic atrial fibrillation: Secondary | ICD-10-CM | POA: Diagnosis not present

## 2016-07-13 ENCOUNTER — Other Ambulatory Visit: Payer: Self-pay | Admitting: Internal Medicine

## 2016-07-16 DIAGNOSIS — I482 Chronic atrial fibrillation: Secondary | ICD-10-CM | POA: Diagnosis not present

## 2016-07-16 DIAGNOSIS — R062 Wheezing: Secondary | ICD-10-CM | POA: Diagnosis not present

## 2016-07-16 DIAGNOSIS — R0682 Tachypnea, not elsewhere classified: Secondary | ICD-10-CM | POA: Diagnosis not present

## 2016-07-24 ENCOUNTER — Other Ambulatory Visit: Payer: Self-pay | Admitting: Internal Medicine

## 2016-07-30 DIAGNOSIS — B029 Zoster without complications: Secondary | ICD-10-CM | POA: Diagnosis not present

## 2016-07-30 DIAGNOSIS — J449 Chronic obstructive pulmonary disease, unspecified: Secondary | ICD-10-CM | POA: Diagnosis not present

## 2016-07-31 ENCOUNTER — Ambulatory Visit (INDEPENDENT_AMBULATORY_CARE_PROVIDER_SITE_OTHER): Payer: Medicare Other | Admitting: Adult Health

## 2016-07-31 ENCOUNTER — Ambulatory Visit (INDEPENDENT_AMBULATORY_CARE_PROVIDER_SITE_OTHER)
Admission: RE | Admit: 2016-07-31 | Discharge: 2016-07-31 | Disposition: A | Discharge status: Home / Self Care | Payer: Medicare Other | Source: Ambulatory Visit | Attending: Adult Health | Admitting: Adult Health

## 2016-07-31 ENCOUNTER — Encounter: Payer: Self-pay | Admitting: Adult Health

## 2016-07-31 ENCOUNTER — Telehealth: Payer: Self-pay | Admitting: Adult Health

## 2016-07-31 VITALS — BP 110/64 | HR 102 | Ht 67.0 in | Wt 206.6 lb

## 2016-07-31 DIAGNOSIS — J9610 Chronic respiratory failure, unspecified whether with hypoxia or hypercapnia: Secondary | ICD-10-CM | POA: Insufficient documentation

## 2016-07-31 DIAGNOSIS — J9611 Chronic respiratory failure with hypoxia: Secondary | ICD-10-CM

## 2016-07-31 DIAGNOSIS — J449 Chronic obstructive pulmonary disease, unspecified: Secondary | ICD-10-CM

## 2016-07-31 DIAGNOSIS — R05 Cough: Secondary | ICD-10-CM | POA: Diagnosis not present

## 2016-07-31 MED ORDER — FLUTICASONE FUROATE-VILANTEROL 100-25 MCG/INH IN AEPB: 1.0000 | INHALATION_SPRAY | Freq: Every day | RESPIRATORY_TRACT | 0 refills | Status: DC

## 2016-07-31 MED ORDER — ALBUTEROL SULFATE (2.5 MG/3ML) 0.083% IN NEBU: INHALATION_SOLUTION | RESPIRATORY_TRACT | 6 refills | Status: DC

## 2016-07-31 MED ORDER — FLUTICASONE FUROATE-VILANTEROL 100-25 MCG/INH IN AEPB: 1.0000 | INHALATION_SPRAY | Freq: Every day | RESPIRATORY_TRACT | 3 refills | Status: DC

## 2016-07-31 MED ORDER — IPRATROPIUM BROMIDE 0.02 % IN SOLN: RESPIRATORY_TRACT | 6 refills | Status: DC

## 2016-07-31 NOTE — Assessment & Plan Note (Signed)
Desaturations with ambulation -has O2 at home but not using it , does not have any tanks or poc.  Needs POC   Plan  Patient Instructions  Finish Doxycycline .  Mucinex DM Twice daily  As needed  Cough/congestion  Continue on Duoneb Four times a day  .  Begin Oxygen 2l/m with activity  Order to DME for portable concentrator.  Begin BREO 1 puff daily .  Follow up Dr. Melvyn Novas  In 6 weeks and As needed   Please contact office for sooner follow up if symptoms do not improve or worsen or seek emergency care

## 2016-07-31 NOTE — Telephone Encounter (Signed)
Called and spoke with pt and he is aware of refills of the nebulizer meds that have been sent to the pharmacy. Nothing further is needed.

## 2016-07-31 NOTE — Progress Notes (Signed)
Patient seen in the office today and instructed on use of Breo 100.  Patient expressed understanding and demonstrated technique. Parke Poisson, CMA 07/31/16

## 2016-07-31 NOTE — Assessment & Plan Note (Signed)
Recent flare slow to resolve  CXR w/ no acute process  Add BREO   Plan  Patient Instructions  Finish Doxycycline .  Mucinex DM Twice daily  As needed  Cough/congestion  Continue on Duoneb Four times a day  .  Begin Oxygen 2l/m with activity  Order to DME for portable concentrator.  Begin BREO 1 puff daily .  Follow up Dr. Melvyn Novas  In 6 weeks and As needed   Please contact office for sooner follow up if symptoms do not improve or worsen or seek emergency care

## 2016-07-31 NOTE — Progress Notes (Signed)
@Patient  ID: Herbert May, male    DOB: 01/15/1941, 76 y.o.   MRN: [id number]  Chief Complaint  Patient presents with  . Acute Visit    Cough /COPD     Referring provider: Rosita Fire, MD  HPI: 76 year old male former smoker followed for Gold II COPD   TEST  - PFT's  02/07/2015  FEV1 1.44 (52 % ) ratio 52  p no % improvement from saba with DLCO  58 % corrects to 85 % for alv volume    07/31/2016 Acute OV : COPD  Pt presents for an acute office visit. Complains of persistent cough, congestion , dyspnea and wheezing . Worse for last 3-4 weeks .  Recently admitted 2 weeks ago at  [facility] for COPD flare , tx w/ abx and steroids . No discharge summary at todays visit. Request sent to hospital . Since got out of hospital he is not feeling much better. Still has cough/congestion and wheezing . Seen by PCP yesterday and started Doxycycline . Coughing up thick green mucus.  No chest pain or hemoptysis. Appetitie is down. No n/v/d.  Last seen in pulmonary clinic in 2016.  Previously on Symbicort and Spiriva in past but insurance did not cover .  On Duoneb Four times a day  . Insurance covers this well.   Has chronic joint pain /DJD on chronic narcotics with fentanyl and percocet.   Has Oxygen at home but does not wear it. O2 sats 87% on RA , on O2 94% on 2l/m    Allergies  Allergen Reactions  . Aspirin Hives and Itching  . Fish Allergy Nausea And Vomiting  . Latex Other (See Comments)    Tears Skin   . Onion Nausea And Vomiting  . Other Other (See Comments)    Just doesn't like [place]   . Levaquin [Levofloxacin In D5w] Rash  . Neosporin [Neomycin-Bacitracin Zn-Polymyx] Rash    Immunization History  Administered Date(s) Administered  . Influenza Split 02/28/2016  . Pneumococcal Polysaccharide-23 03/28/2016    Past Medical History:  Diagnosis Date  . Allergy   . Arthritis   . Asthma   . CAD (coronary artery disease) of artery bypass graft 12/27/2012  . Colostomy  status ([place])   . COPD (chronic obstructive pulmonary disease) ([place])   . Diabetes mellitus without complication ([place])   . Emphysema of lung ([place])   . GERD (gastroesophageal reflux disease)   . Heart attack   . Hyperlipidemia   . Hypertension   . Oxygen deficiency     Tobacco History: History  Smoking Status  . Former Smoker  . Packs/day: 1.00  . Years: 37.00  . Types: Cigarettes  . Start date: 12/30/1953  . Quit date: 12/28/1990  Smokeless Tobacco  . Former Systems developer  . Quit date: 06/30/1983   Counseling given: Not Answered   Outpatient Encounter Prescriptions as of 07/31/2016  Medication Sig  . albuterol (PROAIR HFA) 108 (90 BASE) MCG/ACT inhaler Inhale 1 puff into the lungs every 4 (four) hours as needed for wheezing or shortness of breath.  Marland Kitchen albuterol (PROVENTIL) (2.5 MG/3ML) 0.083% nebulizer solution INHALE CONTENTS OF 1 VIAL IN NEBULIZER EVERY 6 HOURS AS NEEDED FOR FOR WHEEZING OR SHORTNESS OF BREATH.  . budesonide-formoterol (SYMBICORT) 160-4.5 MCG/ACT inhaler Inhale 2 puffs into the lungs 2 (two) times daily.  . diazepam (VALIUM) 5 MG tablet Take 5 mg by mouth daily.  Marland Kitchen diltiazem (CARDIZEM) 30 MG tablet Take 1 tablet (30 mg total) by mouth 2 (  two) times daily.  . fentaNYL (DURAGESIC - DOSED MCG/HR) 100 MCG/HR Apply one patch to skin every 3 days if tolerated  . fluticasone (FLONASE) 50 MCG/ACT nasal spray Place 2 sprays into both nostrils daily.  Marland Kitchen gabapentin (NEURONTIN) 300 MG capsule Take 1 capsule by mouth 3 (three) times daily.  Marland Kitchen ipratropium (ATROVENT) 0.02 % nebulizer solution INHALE CONTENTS OF 1 VIAL IN NEBULIZER EVERY 6 HOURS AS NEEDED FOR FOR WHEEZING AND SHORTNESS OF BREATH.  . metFORMIN (GLUCOPHAGE) 500 MG tablet Take 500 mg by mouth 2 (two) times daily with a meal.  . nitroGLYCERIN (NITROSTAT) 0.4 MG SL tablet DISSOLVE ONE TABLET UNDER TONGUE EVERY 5 MINUTES UP TO 3 DOSES AS NEEDED FOR CHEST PAIN  . oxyCODONE-acetaminophen (PERCOCET) 10-325 MG tablet Limit one tablet by  mouth per day or 2-4 times per day for breakthrough pain while wearing fentanyl patch if tolerated  . pantoprazole (PROTONIX) 40 MG tablet Take 1 tablet (40 mg total) by mouth 2 (two) times daily.  . potassium chloride SA (K-DUR,KLOR-CON) 20 MEQ tablet Take 20 mEq by mouth daily.  . simvastatin (ZOCOR) 40 MG tablet TAKE ONE TABLET BY MOUTH DAILY AT 6 PM.  . traZODone (DESYREL) 100 MG tablet TAKE ONE TABLET BY MOUTH AT BEDTIME AS NEEDED FOR SLEEP (Patient taking differently: TAKE ONE TABLET BY MOUTH AT BEDTIME FOR SLEEP)  . fluticasone furoate-vilanterol (BREO ELLIPTA) 100-25 MCG/INH AEPB Inhale 1 puff into the lungs daily.  . fluticasone furoate-vilanterol (BREO ELLIPTA) 100-25 MCG/INH AEPB Inhale 1 puff into the lungs daily.   Facility-Administered Encounter Medications as of 07/31/2016  Medication  . diclofenac sodium (VOLTAREN) 1 % transdermal gel 4 g  . fentaNYL (SUBLIMAZE) injection 100 mcg  . lactated ringers infusion 1,000 mL  . lactated ringers infusion 1,000 mL  . midazolam (VERSED) 5 MG/5ML injection 5 mg     Review of Systems  Constitutional:   No  weight loss, night sweats,  Fevers, chills, + fatigue, or  lassitude.  HEENT:   No headaches,  Difficulty swallowing,  Tooth/dental problems, or  Sore throat,                No sneezing, itching, ear ache, + nasal congestion, post nasal drip,   CV:  No chest pain,  Orthopnea, PND, swelling in lower extremities, anasarca, dizziness, palpitations, syncope.   GI  No heartburn, indigestion, abdominal pain, nausea, vomiting, diarrhea, change in bowel habits, loss of appetite, bloody stools.   Resp:    No wheezing.  No chest wall deformity  Skin: no rash or lesions.  GU: no dysuria, change in color of urine, no urgency or frequency.  No flank pain, no hematuria   MS:  No joint pain or swelling.  No decreased range of motion.  No back pain.    Physical Exam  BP 110/64 (BP Location: Left Arm, Cuff Size: Normal)   Pulse (!) 102    Ht 5\' 7"  (1.702 m)   Wt 206 lb 9.6 oz (93.7 kg)   SpO2 92%   BMI 32.36 kg/m   GEN: A/Ox3; pleasant , NAD, elderly    HEENT:  [place]/AT,  EACs-clear, TMs-wnl, NOSE-clear, THROAT-clear, no lesions, no postnasal drip or exudate noted.   NECK:  Supple w/ fair ROM; no JVD; normal carotid impulses w/o bruits; no thyromegaly or nodules palpated; no lymphadenopathy.    RESP  Few rhonchi . no accessory muscle use, no dullness to percussion  CARD:  RRR, no m/r/g, tr peripheral edema, pulses  intact, no cyanosis or clubbing.  GI:   Soft & nt; nml bowel sounds; no organomegaly or masses detected.   Musco: Warm bil, no deformities or joint swelling noted.   Neuro: alert, no focal deficits noted.    Skin: Warm, no lesions or rashes  Psych:  No change in mood or affect. No depression or anxiety.  No memory loss.  Lab Results:  CBC Imaging: Dg Chest 2 View  Result Date: 07/31/2016 CLINICAL DATA:  Cough and wheezing. EXAM: CHEST  2 VIEW COMPARISON:  07/16/2016 . FINDINGS: Mediastinum hilar structures are normal. Heart size normal. Stable mild bibasilar pleuroparenchymal thickening noted consistent scarring. No acute infiltrate. No pleural effusion or pneumothorax . IMPRESSION: Stable mild bibasilar pleuroparenchymal thickening consistent with scarring. No acute abnormality identified. Electronically Signed   By: Marcello Moores  Register   On: 07/31/2016 11:41     Assessment & Plan:   COPD GOLD II  Recent flare slow to resolve  CXR w/ no acute process  Add BREO   Plan  Patient Instructions  Finish Doxycycline .  Mucinex DM Twice daily  As needed  Cough/congestion  Continue on Duoneb Four times a day  .  Begin Oxygen 2l/m with activity  Order to DME for portable concentrator.  Begin BREO 1 puff daily .  Follow up Dr. Melvyn Novas  In 6 weeks and As needed   Please contact office for sooner follow up if symptoms do not improve or worsen or seek emergency care       Chronic respiratory failure  ([place]) Desaturations with ambulation -has O2 at home but not using it , does not have any tanks or poc.  Needs POC   Plan  Patient Instructions  Finish Doxycycline .  Mucinex DM Twice daily  As needed  Cough/congestion  Continue on Duoneb Four times a day  .  Begin Oxygen 2l/m with activity  Order to DME for portable concentrator.  Begin BREO 1 puff daily .  Follow up Dr. Melvyn Novas  In 6 weeks and As needed   Please contact office for sooner follow up if symptoms do not improve or worsen or seek emergency care          Rexene Edison, NP 07/31/2016

## 2016-07-31 NOTE — Patient Instructions (Addendum)
Finish Doxycycline .  Mucinex DM Twice daily  As needed  Cough/congestion  Continue on Duoneb Four times a day  .  Begin Oxygen 2l/m with activity  Order to DME for portable concentrator.  Begin BREO 1 puff daily .  Follow up Dr. Melvyn Novas  In 6 weeks and As needed   Please contact office for sooner follow up if symptoms do not improve or worsen or seek emergency care

## 2016-08-01 NOTE — Progress Notes (Signed)
Chart and office note reviewed in detail  > agree with a/p as outlined    

## 2016-08-03 ENCOUNTER — Telehealth: Payer: Self-pay | Admitting: Adult Health

## 2016-08-03 DIAGNOSIS — J449 Chronic obstructive pulmonary disease, unspecified: Secondary | ICD-10-CM | POA: Diagnosis not present

## 2016-08-03 NOTE — Telephone Encounter (Signed)
Notes Recorded by Melvenia Needles, NP on 07/31/2016 at 5:10 PM EST Chronic changes No sign of PNA  Cont w/ ov recs Please contact office for sooner follow up if symptoms do not improve or worsen or seek emergency care  ---------------   Spoke with pt, aware of results/recs.  Nothing further needed.

## 2016-08-03 NOTE — Progress Notes (Signed)
LMOMTCB x 1 

## 2016-08-12 DIAGNOSIS — J449 Chronic obstructive pulmonary disease, unspecified: Secondary | ICD-10-CM | POA: Diagnosis not present

## 2016-08-31 DIAGNOSIS — Z79891 Long term (current) use of opiate analgesic: Secondary | ICD-10-CM | POA: Diagnosis not present

## 2016-08-31 DIAGNOSIS — G894 Chronic pain syndrome: Secondary | ICD-10-CM | POA: Diagnosis not present

## 2016-08-31 DIAGNOSIS — J449 Chronic obstructive pulmonary disease, unspecified: Secondary | ICD-10-CM | POA: Diagnosis not present

## 2016-09-09 DIAGNOSIS — J449 Chronic obstructive pulmonary disease, unspecified: Secondary | ICD-10-CM | POA: Diagnosis not present

## 2016-09-11 ENCOUNTER — Ambulatory Visit (INDEPENDENT_AMBULATORY_CARE_PROVIDER_SITE_OTHER): Payer: Medicare Other | Admitting: Internal Medicine

## 2016-09-11 ENCOUNTER — Encounter: Payer: Self-pay | Admitting: Internal Medicine

## 2016-09-11 VITALS — BP 130/80 | HR 87 | Ht 67.0 in | Wt 210.2 lb

## 2016-09-11 DIAGNOSIS — J449 Chronic obstructive pulmonary disease, unspecified: Secondary | ICD-10-CM

## 2016-09-11 DIAGNOSIS — J9611 Chronic respiratory failure with hypoxia: Secondary | ICD-10-CM | POA: Diagnosis not present

## 2016-09-11 NOTE — Patient Instructions (Addendum)
Plan A = Automatic = BREO one click each am    Plan B = Backup Only use your albuterol (Proair)  as a rescue medication to be used if you can't catch your breath by resting or doing a relaxed purse lip breathing pattern.  - The less you use it, the better it will work when you need it. - Ok to use the inhaler up to 2 puffs  every 4 hours if you must but call for appointment if use goes up over your usual need - Don't leave home without it !!  (think of it like the spare tire for your car)   Work on inhaler technique:  relax and gently blow all the way out then take a nice smooth deep breath back in, triggering the inhaler at same time you start breathing in.  Hold for up to 5 seconds if you can.   Rinse and gargle with water when done    Plan C = Crisis - only use your albuterol/ipatropium nebulizer if you first try Plan B and it fails to help > ok to use the nebulizer up to every 4 hours but if start needing it regularly call for immediate appointment    Please schedule a follow up office visit in 6 weeks, call sooner if needed with pfts on return and bring all medications with you

## 2016-09-11 NOTE — Progress Notes (Signed)
Subjective:    Patient ID: Herbert May, male    DOB: [birth year],   MRN: [id number]  HPI  26 yowm quit smoking in 1992 with breathing problems then that worsened over the years esp since 2011 lived in [place] and moved to [place] in  2013 on 02 since early 2016 at hs only.   01/24/2015 1st [facility] office visit/ Donnette Macmullen  On flovent/ spiriva  Chief Complaint  Patient presents with  . Pulmonary Consult    Self referral. Pt states has had SOB "all of my life"- worse x 3 months. He states he is SOB all of the time, with or without exertion. He also c/o cough- prod with yellow sputum.    gradually worse doe x 4-5 years but can still do [employer] slowly but c/o sense of sob at rest assoc with congested cough and worse symptoms walking in hot humid conditions not much better with saba but doesn't really understand how to use them  rec Ok to use the combination of albuterol/ipatriprium up to 4 x daily  Only use your albuterol as a rescue medication pantoprazole 40  Mg Take 30- 60 min before your first and last meals of the day  GERD diet   07/31/16 NP  Pt presents for an acute office visit. Complains of persistent cough, congestion , dyspnea and wheezing . Worse for last 3-4 weeks .  Recently admitted 2 weeks ago at  [facility] for COPD flare , tx w/ abx and steroids . No discharge summary at todays visit. Request sent to hospital . Since got out of hospital he is not feeling much better. Still has cough/congestion and wheezing . Seen by PCP yesterday and started Doxycycline . Coughing up thick green mucus.  No chest pain or hemoptysis. Appetitie is down. No n/v/d.  Last seen in pulmonary clinic in 2016.  Previously on Symbicort and Spiriva in past but insurance did not cover .  On Duoneb Four times a day  . Insurance covers this well.  rec Finish Doxycycline .  Mucinex DM Twice daily  As needed  Cough/congestion  Continue on Duoneb Four times a day  .  Begin Oxygen 2l/m with  activity  Order to DME for portable concentrator.  Begin BREO 1 puff daily .     09/11/2016  f/u ov/Rjay Revolorio re: GOLD II copd/ maint BREO 02 2lpm hs and prn daytime  Chief Complaint  Patient presents with  . Follow-up    COPD, pt reports SOB with exertion at times,    doe =  MMRC2 = can't walk a nl pace on a flat grade s sob but does fine slow and flat eg shopping/ not wearing 02 consistently     No obvious day to day or daytime variability or assoc excess/ purulent sputum or mucus plugs or hemoptysis or cp or chest tightness, subjective wheeze or overt sinus or hb symptoms. No unusual exp hx or h/o childhood pna/ asthma or knowledge of premature birth.  Sleeping ok without nocturnal  or early am exacerbation  of respiratory  c/o's or need for noct saba. Also denies any obvious fluctuation of symptoms with weather or environmental changes or other aggravating or alleviating factors except as outlined above   Current Medications, Allergies, Complete Past Medical History, Past Surgical History, Family History, and Social History were reviewed in [employer] record.  ROS  The following are not active complaints unless bolded sore throat, dysphagia, dental problems, itching, sneezing,  nasal  congestion or excess/ purulent secretions, ear ache,   fever, chills, sweats, unintended wt loss, classically pleuritic or exertional cp,  orthopnea pnd or leg swelling, presyncope, palpitations, abdominal pain, anorexia, nausea, vomiting, diarrhea  or change in bowel or bladder habits, change in stools or urine, dysuria,hematuria,  rash, arthralgias, visual complaints, headache, numbness, weakness or ataxia or problems with walking or coordination,  change in mood/affect or memory.                   Objective:   Physical Exam  amb wm nad    Wt Readings from Last 3 Encounters:  09/11/16 210 lb 3.2 oz (95.3 kg)  07/31/16 206 lb 9.6 oz (93.7 kg)  03/19/16 215 lb (97.5 kg)      Vital signs reviewed  - Note on arrival 02 sats  90% on RA    HEENT: edentulous /  nl   turbinates, and orophanx. Nl external ear canals without cough reflex   NECK :  without JVD/Nodes/TM/ nl carotid upstrokes bilaterally   LUNGS: no acc muscle use, distant bs bilaterally  s wheeze   CV:  RRR  no s3 or murmur or increase in P2, no edema   ABD:  Moderately obese but soft and nontender with limited  excursion in the supine position. No bruits or organomegaly, bowel sounds nl  MS:  warm without deformities, calf tenderness, cyanosis or clubbing  SKIN: warm and dry without lesions    NEURO:  alert, approp, no deficits         I personally reviewed images and agree with radiology impression as follows:  CXR:   07/31/16 Stable mild bibasilar pleuroparenchymal thickening consistent with scarring. No acute abnormality identified.         Assessment & Plan:

## 2016-09-11 NOTE — Assessment & Plan Note (Addendum)
Body mass index is 32.92 kg/m.  trending up  Lab Results  Component Value Date   TSH 1.990 02/08/2014     Contributing to gerd risk/ doe >> reviewed the need and the process to achieve and maintain neg calorie balance > defer f/u primary care including intermittently monitoring thyroid status

## 2016-09-13 ENCOUNTER — Encounter: Payer: Self-pay | Admitting: Internal Medicine

## 2016-09-13 NOTE — Assessment & Plan Note (Signed)
ono RA ordered  01/29/15  < 89% @ 31 m 36sec > rec 2lpm at hs   Added prn 02 with activity 07/31/16 with goal of keeping sats > 90%

## 2016-09-13 NOTE — Assessment & Plan Note (Signed)
-   quit smoking 1992  - MMRC 2 at first pulmonary ov 01/24/15  - 01/24/2015  Walked RA x 3 laps @ 185 ft each stopped due to  End of study, slow pace, no sob or desat   - 01/24/2015 p extensive coaching HFA effectiveness =   75% > try symbicort 160 2bid > pt d/c'd on his own  - PFT's  02/07/2015  FEV1 1.44 (52 % ) ratio 52  p no % improvement from saba with DLCO  58 % corrects to 85 % for alv volume    - 09/11/2016  After extensive coaching HFA effectiveness =    75% with hfa/ 90% with dpi   Back to baseline p flare so maint rx with BREO ok but may consider adding Incruse or changing to Trelegy because he appears now to be more of a  Group D in terms of symptom/risk and laba/lama/ICS  therefore appropriate rx at this point but may be limited by cost  I had an extended discussion with the patient reviewing all relevant studies completed to date and  lasting 15 to 20 minutes of a 25 minute visit    Each maintenance medication was reviewed in detail including most importantly the difference between maintenance and prns and under what circumstances the prns are to be triggered using an action plan format that is not reflected in the computer generated alphabetically organized AVS.    Please see AVS for specific instructions unique to this visit that I personally wrote and verbalized to the the pt in detail and then reviewed with pt  by my nurse highlighting any  changes in therapy recommended at today's visit to their plan of care.

## 2016-09-24 DIAGNOSIS — C61 Malignant neoplasm of prostate: Secondary | ICD-10-CM | POA: Diagnosis not present

## 2016-09-24 DIAGNOSIS — E119 Type 2 diabetes mellitus without complications: Secondary | ICD-10-CM | POA: Diagnosis not present

## 2016-09-24 DIAGNOSIS — J449 Chronic obstructive pulmonary disease, unspecified: Secondary | ICD-10-CM | POA: Diagnosis not present

## 2016-09-24 DIAGNOSIS — Z1389 Encounter for screening for other disorder: Secondary | ICD-10-CM | POA: Diagnosis not present

## 2016-09-24 DIAGNOSIS — E785 Hyperlipidemia, unspecified: Secondary | ICD-10-CM | POA: Diagnosis not present

## 2016-09-24 DIAGNOSIS — E1165 Type 2 diabetes mellitus with hyperglycemia: Secondary | ICD-10-CM | POA: Diagnosis not present

## 2016-09-24 DIAGNOSIS — I2510 Atherosclerotic heart disease of native coronary artery without angina pectoris: Secondary | ICD-10-CM | POA: Diagnosis not present

## 2016-09-29 DIAGNOSIS — M5031 Other cervical disc degeneration,  high cervical region: Secondary | ICD-10-CM | POA: Diagnosis not present

## 2016-09-29 DIAGNOSIS — M542 Cervicalgia: Secondary | ICD-10-CM | POA: Diagnosis not present

## 2016-09-29 DIAGNOSIS — Z79891 Long term (current) use of opiate analgesic: Secondary | ICD-10-CM | POA: Diagnosis not present

## 2016-09-29 DIAGNOSIS — M5033 Other cervical disc degeneration, cervicothoracic region: Secondary | ICD-10-CM | POA: Diagnosis not present

## 2016-09-29 DIAGNOSIS — M545 Low back pain: Secondary | ICD-10-CM | POA: Diagnosis not present

## 2016-10-01 DIAGNOSIS — J449 Chronic obstructive pulmonary disease, unspecified: Secondary | ICD-10-CM | POA: Diagnosis not present

## 2016-10-10 DIAGNOSIS — J449 Chronic obstructive pulmonary disease, unspecified: Secondary | ICD-10-CM | POA: Diagnosis not present

## 2016-10-21 DIAGNOSIS — M7062 Trochanteric bursitis, left hip: Secondary | ICD-10-CM | POA: Diagnosis not present

## 2016-10-21 DIAGNOSIS — M25551 Pain in right hip: Secondary | ICD-10-CM | POA: Diagnosis not present

## 2016-10-21 DIAGNOSIS — M7061 Trochanteric bursitis, right hip: Secondary | ICD-10-CM | POA: Diagnosis not present

## 2016-10-21 DIAGNOSIS — M25552 Pain in left hip: Secondary | ICD-10-CM | POA: Diagnosis not present

## 2016-10-26 DIAGNOSIS — M7061 Trochanteric bursitis, right hip: Secondary | ICD-10-CM | POA: Diagnosis not present

## 2016-10-26 DIAGNOSIS — M5031 Other cervical disc degeneration,  high cervical region: Secondary | ICD-10-CM | POA: Diagnosis not present

## 2016-10-26 DIAGNOSIS — Z79891 Long term (current) use of opiate analgesic: Secondary | ICD-10-CM | POA: Diagnosis not present

## 2016-10-26 DIAGNOSIS — M542 Cervicalgia: Secondary | ICD-10-CM | POA: Diagnosis not present

## 2016-10-26 DIAGNOSIS — M5033 Other cervical disc degeneration, cervicothoracic region: Secondary | ICD-10-CM | POA: Diagnosis not present

## 2016-10-30 ENCOUNTER — Ambulatory Visit (INDEPENDENT_AMBULATORY_CARE_PROVIDER_SITE_OTHER): Payer: Medicare Other | Admitting: Internal Medicine

## 2016-10-30 ENCOUNTER — Encounter: Payer: Self-pay | Admitting: Internal Medicine

## 2016-10-30 VITALS — BP 110/60 | HR 86 | Ht 67.0 in | Wt 205.0 lb

## 2016-10-30 DIAGNOSIS — J9611 Chronic respiratory failure with hypoxia: Secondary | ICD-10-CM | POA: Diagnosis not present

## 2016-10-30 DIAGNOSIS — J449 Chronic obstructive pulmonary disease, unspecified: Secondary | ICD-10-CM

## 2016-10-30 LAB — PULMONARY FUNCTION TEST
DL/VA % PRED: 90 %
DL/VA: 3.96 ml/min/mmHg/L
DLCO cor % pred: 69 %
DLCO cor: 19.57 ml/min/mmHg
DLCO unc % pred: 70 %
DLCO unc: 20.05 ml/min/mmHg
FEF 25-75 Post: 0.65 L/sec
FEF 25-75 Pre: 0.77 L/sec
FEF2575-%CHANGE-POST: -15 %
FEF2575-%Pred-Post: 34 %
FEF2575-%Pred-Pre: 40 %
FEV1-%CHANGE-POST: -4 %
FEV1-%PRED-PRE: 61 %
FEV1-%Pred-Post: 58 %
FEV1-POST: 1.56 L
FEV1-Pre: 1.63 L
FEV1FVC-%CHANGE-POST: 1 %
FEV1FVC-%Pred-Pre: 77 %
FEV6-%Change-Post: -5 %
FEV6-%PRED-PRE: 82 %
FEV6-%Pred-Post: 77 %
FEV6-POST: 2.68 L
FEV6-PRE: 2.85 L
FEV6FVC-%Change-Post: 0 %
FEV6FVC-%PRED-POST: 104 %
FEV6FVC-%PRED-PRE: 104 %
FVC-%Change-Post: -5 %
FVC-%PRED-POST: 74 %
FVC-%PRED-PRE: 78 %
FVC-POST: 2.77 L
FVC-PRE: 2.92 L
POST FEV6/FVC RATIO: 97 %
PRE FEV1/FVC RATIO: 56 %
PRE FEV6/FVC RATIO: 98 %
Post FEV1/FVC ratio: 56 %
RV % PRED: 138 %
RV: 3.34 L
TLC % PRED: 95 %
TLC: 6.18 L

## 2016-10-30 MED ORDER — FLUTICASONE-UMECLIDIN-VILANT 100-62.5-25 MCG/INH IN AEPB: 1.0000 | INHALATION_SPRAY | Freq: Every day | RESPIRATORY_TRACT | 11 refills | Status: DC

## 2016-10-30 MED ORDER — FLUTICASONE-UMECLIDIN-VILANT 100-62.5-25 MCG/INH IN AEPB: 1.0000 | INHALATION_SPRAY | Freq: Every day | RESPIRATORY_TRACT | 0 refills | Status: DC

## 2016-10-30 NOTE — Progress Notes (Signed)
Subjective:    Patient ID: Herbert May, male    DOB: 1940/07/28,   MRN: [id number]    Brief patient profile:  75 yowm quit smoking in 1992 with breathing problems then that worsened over the years esp since 2011 lived in [place] and moved to [place] in  2013 on 02 since early 2016 at hs only.      History of Present Illness  01/24/2015 1st [facility] office visit/ Shaquia Berkley  On flovent/ spiriva  Chief Complaint  Patient presents with  . Pulmonary Consult    Self referral. Pt states has had SOB "all of my life"- worse x 3 months. He states he is SOB all of the time, with or without exertion. He also c/o cough- prod with yellow sputum.    gradually worse doe x 4-5 years but can still do [employer] slowly but c/o sense of sob at rest assoc with congested cough and worse symptoms walking in hot humid conditions not much better with saba but doesn't really understand how to use them  rec Ok to use the combination of albuterol/ipatriprium up to 4 x daily  Only use your albuterol as a rescue medication pantoprazole 40  Mg Take 30- 60 min before your first and last meals of the day  GERD diet   07/31/16 NP  Pt presents for an acute office visit. Complains of persistent cough, congestion , dyspnea and wheezing . Worse for last 3-4 weeks .  Recently admitted 2 weeks ago at  [facility] for COPD flare , tx w/ abx and steroids . No discharge summary at todays visit. Request sent to hospital . Since got out of hospital he is not feeling much better. Still has cough/congestion and wheezing . Seen by PCP yesterday and started Doxycycline . Coughing up thick green mucus.  No chest pain or hemoptysis. Appetitie is down. No n/v/d.  Last seen in pulmonary clinic in 2016.  Previously on Symbicort and Spiriva in past but insurance did not cover .  On Duoneb Four times a day  . Insurance covers this well.  rec Finish Doxycycline .  Mucinex DM Twice daily  As needed  Cough/congestion  Continue on  Duoneb Four times a day  .  Begin Oxygen 2l/m with activity  Order to DME for portable concentrator.  Begin BREO 1 puff daily .     10/30/2016  f/u ov/Jarred Purtee re:  GOLD II copd / breo maint / overuse of saba noted / 02 2lpm  hs and prn with ambulation  Chief Complaint  Patient presents with  . Follow-up    PFT's done today. Breathing is unchanged. He is using proair 3-4 x per day and albuterol neb 4 x daily.   doe = MMRC2 usually s 02   No obvious day to day or daytime variability or assoc excess/ purulent sputum or mucus plugs or hemoptysis or cp or chest tightness, subjective wheeze or overt sinus or hb symptoms. No unusual exp hx or h/o childhood pna/ asthma or knowledge of premature birth.  Sleeping ok without nocturnal  or early am exacerbation  of respiratory  c/o's or need for noct saba. Also denies any obvious fluctuation of symptoms with weather or environmental changes or other aggravating or alleviating factors except as outlined above   Current Medications, Allergies, Complete Past Medical History, Past Surgical History, Family History, and Social History were reviewed in [employer] record.  ROS  The following are not active complaints unless bolded  sore throat, dysphagia, dental problems, itching, sneezing,  nasal congestion or excess/ purulent secretions, ear ache,   fever, chills, sweats, unintended wt loss, classically pleuritic or exertional cp,  orthopnea pnd or leg swelling, presyncope, palpitations, abdominal pain, anorexia, nausea, vomiting, diarrhea  or change in bowel or bladder habits, change in stools or urine, dysuria,hematuria,  rash, arthralgias, visual complaints, headache, numbness, weakness or ataxia or problems with walking or coordination,  change in mood/affect or memory.                           Objective:   Physical Exam  amb wm nad    10/30/2016         205   09/11/16 210 lb 3.2 oz (95.3 kg)  07/31/16 206 lb 9.6 oz  (93.7 kg)  03/19/16 215 lb (97.5 kg)    Vital signs reviewed  - Note on arrival 02 sats  96% on RA    HEENT: edentulous /  nl   turbinates, and orophanx. Nl external ear canals without cough reflex   NECK :  without JVD/Nodes/TM/ nl carotid upstrokes bilaterally   LUNGS: no acc muscle use, hyper resonant to percussion bilaterally/ distant bs  s audible wheeze p saba for pfts   CV:  RRR  no s3 or murmur or increase in P2, no edema   ABD:  Moderately obese but soft and nontender with limited  excursion in the supine position. No bruits or organomegaly, bowel sounds nl  MS:  warm without deformities, calf tenderness, cyanosis or clubbing  SKIN: warm and dry without lesions    NEURO:  alert, approp, no deficits         I personally reviewed images and agree with radiology impression as follows:  CXR:   07/31/16 Stable mild bibasilar pleuroparenchymal thickening consistent with scarring. No acute abnormality identified.         Assessment & Plan:

## 2016-10-30 NOTE — Progress Notes (Signed)
PFT done today. 

## 2016-10-30 NOTE — Patient Instructions (Signed)
Plan A = Automatic = try off the BREO  And replace it with Trelogy one click each am - take 2 long drags and hold 5 seconds each   Plan B = Backup Only use your albuterol (Proair)  as a rescue medication to be used if you can't catch your breath by resting or doing a relaxed purse lip breathing pattern.  - The less you use it, the better it will work when you need it. - Ok to use the inhaler up to 2 puffs  every 4 hours if you must but call for appointment if use goes up over your usual need - Don't leave home without it !!  (think of it like the spare tire for your car)   Work on inhaler technique:  relax and gently blow all the way out then take a nice smooth deep breath back in, triggering the inhaler at same time you start breathing in.  Hold for up to 5 seconds if you can.   Rinse and gargle with water when done    Plan C = Crisis - only use your albuterol/ipatropium nebulizer if you first try Plan B and it fails to help > ok to use the nebulizer up to every 4 hours but if start needing it regularly call for immediate appointment    If you are satisfied with your treatment plan,  let your doctor know and he/she can either refill your medications or you can return here when your prescription runs out.     If in any way you are not 100% satisfied,  please tell us.  If 100% better, tell your friends!  Pulmonary follow up is as needed

## 2016-10-31 DIAGNOSIS — J449 Chronic obstructive pulmonary disease, unspecified: Secondary | ICD-10-CM | POA: Diagnosis not present

## 2016-10-31 NOTE — Assessment & Plan Note (Signed)
ono RA ordered  01/29/15  < 89% @ 31 m 36sec > rec 2lpm at hs   Added prn 02 with activity 07/31/16 with goal of keeping sats > 90%  Adequate control on present rx, reviewed in detail with pt > no change in rx needed

## 2016-10-31 NOTE — Assessment & Plan Note (Signed)
Body mass index is 32.11 kg/m.  -  trending down, encourage  Lab Results  Component Value Date   TSH 1.990 02/08/2014     Contributing to gerd risk/ doe/reviewed the need and the process to achieve and maintain neg calorie balance > defer f/u primary care including intermittently monitoring thyroid status

## 2016-10-31 NOTE — Assessment & Plan Note (Signed)
-   quit smoking 1992  - MMRC 2 at first pulmonary ov 01/24/15  - 01/24/2015  Walked RA x 3 laps @ 185 ft each stopped due to  End of study, slow pace, no sob or desat   - 01/24/2015 p extensive coaching HFA effectiveness =   75% > try symbicort 160 2bid > pt d/c'd on his own  - PFT's  02/07/2015  FEV1 1.44 (52 % ) ratio 52  p no % improvement from saba with DLCO  58 % corrects to 85 % for alv volume   - 09/11/2016  Try BREO - PFT's  10/30/2016  FEV1 1.63 (61 % ) ratio 56  p no % improvement from saba p duob around 4 h prior to study with DLCO  70/69 % corrects to 90 % for alv volume   - 10/30/2016  After extensive coaching device effectiveness =  90+% (upside down on first try)   Doing ok in terms of symptoms though requiring way too much saba so reasonable to try adding LAMA to LABA/ICS at this point = trelogy one click each am   I had an extended final summary discussion with the patient reviewing all relevant studies completed to date and  lasting 15 to 20 minutes of a 25 minute visit on the following issues:    Formulary restrictions will be an ongoing challenge for the forseable future and I would be happy to pick an alternative if the pt will first  provide me a list of them but pt  will need to return here for training for any new device that is required eg dpi vs hfa vs respimat.    In meantime we can always provide samples so the patient never runs out of any needed respiratory medications.   Each maintenance medication was reviewed in detail including most importantly the difference between maintenance and as needed and under what circumstances the prns are to be used.  Please see AVS for specific  Instructions which are unique to this visit and I personally typed out  which were reviewed in detail in writing with the patient and a copy provided.

## 2016-11-03 ENCOUNTER — Telehealth: Payer: Self-pay | Admitting: Internal Medicine

## 2016-11-03 NOTE — Telephone Encounter (Signed)
PA was started on 11/03/16 on [email or web address]. Key is [id number]. Will receive an answer in 72 hours or less.

## 2016-11-04 NOTE — Telephone Encounter (Signed)
PA for Trelegy has been approved. [facility] is aware. Nothing else needed.

## 2016-11-09 DIAGNOSIS — J449 Chronic obstructive pulmonary disease, unspecified: Secondary | ICD-10-CM | POA: Diagnosis not present

## 2016-11-24 DIAGNOSIS — M5033 Other cervical disc degeneration, cervicothoracic region: Secondary | ICD-10-CM | POA: Diagnosis not present

## 2016-11-24 DIAGNOSIS — M5031 Other cervical disc degeneration,  high cervical region: Secondary | ICD-10-CM | POA: Diagnosis not present

## 2016-11-24 DIAGNOSIS — M542 Cervicalgia: Secondary | ICD-10-CM | POA: Diagnosis not present

## 2016-11-24 DIAGNOSIS — M7061 Trochanteric bursitis, right hip: Secondary | ICD-10-CM | POA: Diagnosis not present

## 2016-11-24 DIAGNOSIS — Z79891 Long term (current) use of opiate analgesic: Secondary | ICD-10-CM | POA: Diagnosis not present

## 2016-12-01 DIAGNOSIS — J449 Chronic obstructive pulmonary disease, unspecified: Secondary | ICD-10-CM | POA: Diagnosis not present

## 2016-12-10 DIAGNOSIS — J449 Chronic obstructive pulmonary disease, unspecified: Secondary | ICD-10-CM | POA: Diagnosis not present

## 2016-12-15 DIAGNOSIS — J449 Chronic obstructive pulmonary disease, unspecified: Secondary | ICD-10-CM | POA: Diagnosis not present

## 2016-12-15 DIAGNOSIS — M549 Dorsalgia, unspecified: Secondary | ICD-10-CM | POA: Diagnosis not present

## 2016-12-15 DIAGNOSIS — K219 Gastro-esophageal reflux disease without esophagitis: Secondary | ICD-10-CM | POA: Diagnosis not present

## 2016-12-15 DIAGNOSIS — I2510 Atherosclerotic heart disease of native coronary artery without angina pectoris: Secondary | ICD-10-CM | POA: Diagnosis not present

## 2016-12-15 DIAGNOSIS — E1165 Type 2 diabetes mellitus with hyperglycemia: Secondary | ICD-10-CM | POA: Diagnosis not present

## 2016-12-21 DIAGNOSIS — M5031 Other cervical disc degeneration,  high cervical region: Secondary | ICD-10-CM | POA: Diagnosis not present

## 2016-12-21 DIAGNOSIS — M5033 Other cervical disc degeneration, cervicothoracic region: Secondary | ICD-10-CM | POA: Diagnosis not present

## 2016-12-21 DIAGNOSIS — M7061 Trochanteric bursitis, right hip: Secondary | ICD-10-CM | POA: Diagnosis not present

## 2016-12-21 DIAGNOSIS — M542 Cervicalgia: Secondary | ICD-10-CM | POA: Diagnosis not present

## 2016-12-21 DIAGNOSIS — Z79891 Long term (current) use of opiate analgesic: Secondary | ICD-10-CM | POA: Diagnosis not present

## 2016-12-23 DIAGNOSIS — M25562 Pain in left knee: Secondary | ICD-10-CM | POA: Diagnosis not present

## 2016-12-23 DIAGNOSIS — M1712 Unilateral primary osteoarthritis, left knee: Secondary | ICD-10-CM | POA: Diagnosis not present

## 2016-12-23 DIAGNOSIS — M25512 Pain in left shoulder: Secondary | ICD-10-CM | POA: Diagnosis not present

## 2016-12-23 DIAGNOSIS — M7542 Impingement syndrome of left shoulder: Secondary | ICD-10-CM | POA: Diagnosis not present

## 2016-12-29 DIAGNOSIS — M5031 Other cervical disc degeneration,  high cervical region: Secondary | ICD-10-CM | POA: Diagnosis not present

## 2016-12-29 DIAGNOSIS — Z79891 Long term (current) use of opiate analgesic: Secondary | ICD-10-CM | POA: Diagnosis not present

## 2016-12-29 DIAGNOSIS — M5033 Other cervical disc degeneration, cervicothoracic region: Secondary | ICD-10-CM | POA: Diagnosis not present

## 2016-12-29 DIAGNOSIS — M7061 Trochanteric bursitis, right hip: Secondary | ICD-10-CM | POA: Diagnosis not present

## 2016-12-29 DIAGNOSIS — M542 Cervicalgia: Secondary | ICD-10-CM | POA: Diagnosis not present

## 2016-12-31 DIAGNOSIS — J449 Chronic obstructive pulmonary disease, unspecified: Secondary | ICD-10-CM | POA: Diagnosis not present

## 2017-01-08 DIAGNOSIS — J449 Chronic obstructive pulmonary disease, unspecified: Secondary | ICD-10-CM | POA: Diagnosis not present

## 2017-01-08 DIAGNOSIS — I2510 Atherosclerotic heart disease of native coronary artery without angina pectoris: Secondary | ICD-10-CM | POA: Diagnosis not present

## 2017-01-08 DIAGNOSIS — K219 Gastro-esophageal reflux disease without esophagitis: Secondary | ICD-10-CM | POA: Diagnosis not present

## 2017-01-08 DIAGNOSIS — E784 Other hyperlipidemia: Secondary | ICD-10-CM | POA: Diagnosis not present

## 2017-01-09 DIAGNOSIS — J449 Chronic obstructive pulmonary disease, unspecified: Secondary | ICD-10-CM | POA: Diagnosis not present

## 2017-01-31 DIAGNOSIS — J449 Chronic obstructive pulmonary disease, unspecified: Secondary | ICD-10-CM | POA: Diagnosis not present

## 2017-02-09 DIAGNOSIS — M542 Cervicalgia: Secondary | ICD-10-CM | POA: Diagnosis not present

## 2017-02-09 DIAGNOSIS — M5033 Other cervical disc degeneration, cervicothoracic region: Secondary | ICD-10-CM | POA: Diagnosis not present

## 2017-02-09 DIAGNOSIS — Z79891 Long term (current) use of opiate analgesic: Secondary | ICD-10-CM | POA: Diagnosis not present

## 2017-02-09 DIAGNOSIS — M7061 Trochanteric bursitis, right hip: Secondary | ICD-10-CM | POA: Diagnosis not present

## 2017-02-09 DIAGNOSIS — M5031 Other cervical disc degeneration,  high cervical region: Secondary | ICD-10-CM | POA: Diagnosis not present

## 2017-02-09 DIAGNOSIS — J449 Chronic obstructive pulmonary disease, unspecified: Secondary | ICD-10-CM | POA: Diagnosis not present

## 2017-03-15 LAB — HEMOGLOBIN A1C: Hemoglobin A1C: 7.2

## 2017-03-18 ENCOUNTER — Encounter: Payer: Self-pay | Admitting: Cardiovascular Disease

## 2017-03-18 ENCOUNTER — Ambulatory Visit (INDEPENDENT_AMBULATORY_CARE_PROVIDER_SITE_OTHER): Payer: Medicare Other | Admitting: Cardiovascular Disease

## 2017-03-18 VITALS — BP 110/50 | HR 80 | Ht 69.0 in | Wt 211.0 lb

## 2017-03-18 DIAGNOSIS — I10 Essential (primary) hypertension: Secondary | ICD-10-CM | POA: Diagnosis not present

## 2017-03-18 DIAGNOSIS — Z955 Presence of coronary angioplasty implant and graft: Secondary | ICD-10-CM | POA: Diagnosis not present

## 2017-03-18 DIAGNOSIS — I358 Other nonrheumatic aortic valve disorders: Secondary | ICD-10-CM | POA: Diagnosis not present

## 2017-03-18 DIAGNOSIS — I25768 Atherosclerosis of bypass graft of coronary artery of transplanted heart with other forms of angina pectoris: Secondary | ICD-10-CM | POA: Diagnosis not present

## 2017-03-18 DIAGNOSIS — N529 Male erectile dysfunction, unspecified: Secondary | ICD-10-CM

## 2017-03-18 DIAGNOSIS — E785 Hyperlipidemia, unspecified: Secondary | ICD-10-CM | POA: Diagnosis not present

## 2017-03-18 NOTE — Progress Notes (Signed)
SUBJECTIVE: The patient presents for follow-up of coronary artery disease.  Nuclear stress test 09/09/15 demonstrated a non-reversible defect consistent with artifact. There was no reported ischemia. LVEF 62%. However, it was deemed an intermediate risk study.  Echocardiogram 09/04/15 demonstrated normal left ventricular systolic function, EF 66-06%, indeterminate diastolic function and aortic valve sclerosis without stenosis.  He denies chest pain. He denies progression of chronic exertional dyspnea. He denies morning headaches. He says he snores but his wife has not noticed apneic episodes.  He tries to stay active and walks. He does notice he sleeps more because is usually after doing an hour and a half of yardwork.  He tells me while he still has sexual urges, he is unable to have an erection. He no longer has morning erections. He also describes episodic sweating.  I reviewed labs performed on 03/15/17: Total cholesterol 146, triglycerides 98, HDL 66, LDL 60, white blood cells elevated 13.2, hemoglobin 14, platelets 260, BUN 13, creatinine 0.9.  ECG performed in the office today which I ordered and personally interpreted demonstrated sinus rhythm with left anterior fascicular block.   Review of Systems: As per "subjective", otherwise negative.  Allergies  Allergen Reactions  . Aspirin Hives and Itching  . Fish Allergy Nausea And Vomiting  . Latex Other (See Comments)    Tears Skin   . Onion Nausea And Vomiting  . Other Other (See Comments)    Just doesn't like [place]   . Levaquin [Levofloxacin In D5w] Rash  . Neosporin [Neomycin-Bacitracin Zn-Polymyx] Rash    Current Outpatient Prescriptions  Medication Sig Dispense Refill  . albuterol (PROAIR HFA) 108 (90 BASE) MCG/ACT inhaler Inhale 1 puff into the lungs every 4 (four) hours as needed for wheezing or shortness of breath. 1 Inhaler 12  . albuterol (PROVENTIL) (2.5 MG/3ML) 0.083% nebulizer solution INHALE CONTENTS OF 1  VIAL IN NEBULIZER EVERY 6 HOURS AS NEEDED FOR FOR WHEEZING OR SHORTNESS OF BREATH. 360 mL 6  . diltiazem (CARDIZEM) 30 MG tablet Take 1 tablet (30 mg total) by mouth 2 (two) times daily. 30 tablet 11  . fentaNYL (DURAGESIC - DOSED MCG/HR) 100 MCG/HR Apply one patch to skin every 3 days if tolerated 10 patch 0  . fluticasone (FLONASE) 50 MCG/ACT nasal spray Place 2 sprays into both nostrils daily.    . Fluticasone-Umeclidin-Vilant (TRELEGY ELLIPTA) 100-62.5-25 MCG/INH AEPB Inhale 1 puff into the lungs daily. 28 each 11  . gabapentin (NEURONTIN) 300 MG capsule Take 1 capsule by mouth 3 (three) times daily.    . metFORMIN (GLUCOPHAGE) 500 MG tablet Take 500 mg by mouth 2 (two) times daily with a meal.    . nitroGLYCERIN (NITROSTAT) 0.4 MG SL tablet DISSOLVE ONE TABLET UNDER TONGUE EVERY 5 MINUTES UP TO 3 DOSES AS NEEDED FOR CHEST PAIN 25 tablet 3  . oxyCODONE-acetaminophen (PERCOCET) 10-325 MG tablet Limit one tablet by mouth per day or 2-4 times per day for breakthrough pain while wearing fentanyl patch if tolerated 120 tablet 0  . pantoprazole (PROTONIX) 40 MG tablet Take 1 tablet (40 mg total) by mouth 2 (two) times daily. 60 tablet 11  . potassium chloride SA (K-DUR,KLOR-CON) 20 MEQ tablet Take 20 mEq by mouth daily.    . simvastatin (ZOCOR) 40 MG tablet TAKE ONE TABLET BY MOUTH DAILY AT 6 PM. 30 tablet 4  . traZODone (DESYREL) 100 MG tablet TAKE ONE TABLET BY MOUTH AT BEDTIME AS NEEDED FOR SLEEP (Patient taking differently: TAKE ONE TABLET BY  MOUTH AT BEDTIME FOR SLEEP) 30 tablet 3   Current Facility-Administered Medications  Medication Dose Route Frequency Provider Last Rate Last Dose  . lactated ringers infusion 1,000 mL  1,000 mL Intravenous Continuous Mohammed Kindle, MD 125 mL/hr at 01/15/16 1349 1,000 mL at 01/15/16 1349  . lactated ringers infusion 1,000 mL  1,000 mL Intravenous Continuous Mohammed Kindle, MD 125 mL/hr at 02/26/16 1135 1,000 mL at 02/26/16 1135    Past Medical History:    Diagnosis Date  . Allergy   . Arthritis   . Asthma   . CAD (coronary artery disease) of artery bypass graft 12/27/2012  . Colostomy status ([place])   . COPD (chronic obstructive pulmonary disease) ([place])   . Diabetes mellitus without complication ([place])   . Emphysema of lung ([place])   . GERD (gastroesophageal reflux disease)   . Heart attack ([place])   . Hyperlipidemia   . Hypertension   . Oxygen deficiency     Past Surgical History:  Procedure Laterality Date  . BACK SURGERY    . cardiac stents    . COLOSTOMY    . COLOSTOMY CLOSURE    . HERNIA REPAIR    . KNEE SURGERY    . NOSE SURGERY      Social History   Social History  . Marital status: Married    Spouse name: N/A  . Number of children: N/A  . Years of education: N/A   Occupational History  . Not on file.   Social History Main Topics  . Smoking status: Former Smoker    Packs/day: 1.00    Years: 37.00    Types: Cigarettes    Start date: 12/30/1953    Quit date: 12/28/1990  . Smokeless tobacco: Former Systems developer    Quit date: 06/30/1983  . Alcohol use 0.0 oz/week     Comment: occasional  . Drug use: No  . Sexual activity: Not on file   Other Topics Concern  . Not on file   Social History Narrative  . No narrative on file     Vitals:   03/18/17 0958  BP: (!) 110/50  Pulse: 80  SpO2: 95%  Weight: 211 lb (95.7 kg)  Height: 5\' 9"  (1.753 m)    Wt Readings from Last 3 Encounters:  03/18/17 211 lb (95.7 kg)  10/30/16 205 lb (93 kg)  09/11/16 210 lb 3.2 oz (95.3 kg)     PHYSICAL EXAM General: NAD HEENT: Normal. Neck: No JVD, no thyromegaly. Lungs: Clear to auscultation bilaterally with normal respiratory effort. CV: Nondisplaced PMI.  Regular rate and rhythm, normal S1/S2, no S3/S4, no murmur. No pretibial or periankle edema.  No carotid bruit.   Abdomen: Soft, nontender, no distention.  Neurologic: Alert and oriented.  Psych: Normal affect. Skin: Normal. Musculoskeletal: No gross deformities.    ECG:  Most recent ECG reviewed.   Labs: Lab Results  Component Value Date/Time   K 4.1 08/23/2015 07:43 PM   BUN 10 08/23/2015 07:43 PM   BUN 9 02/08/2014 12:36 PM   CREATININE 0.84 08/23/2015 07:43 PM   CREATININE 1.09 12/12/2012 11:13 AM   ALT 17 02/08/2014 12:36 PM   TSH 1.990 02/08/2014 12:36 PM   HGB 14.7 08/23/2015 07:43 PM     Lipids: Lab Results  Component Value Date/Time   LDLCALC 76 12/12/2012 11:13 AM   CHOL 160 12/12/2012 11:13 AM   TRIG 169 (H) 12/12/2012 11:13 AM   HDL 50 12/12/2012 11:13 AM  ASSESSMENT AND PLAN:  1. 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 His most recent cardiac catheterization from 02/16/2007 showed wide patency of the mid-LAD stent and at that time, he underwent successful stenting of the proximal LAD. There was insignificant disease in the LCx and RCA. He is allergic to ASA and reportedly intolerant to beta blockers. Continue simvastatin. Stress test without ischemia 09/09/15.  2. Aortic sclerosis: Echo reviewed above. Sclerosis w/o stenosis.  3. Essential HTN: Controlled. No changes.  4. Dyslipidemia: Continue simvastatin. Lipids reviewed above and are well controlled.  5. Erectile dysfunction: While he still has sexual urges, he is unable to have an erection. He no longer has morning erections. He also describes episodic sweating. This is suspicious for hypogonadism. I will check a morning testosterone level, FSH, and LH to see if it is hypogonadotropic in etiology.     Disposition: Follow up 6 months.   Kate Sable, M.D., F.A.C.C.

## 2017-03-18 NOTE — Patient Instructions (Addendum)
Medication Instructions:  Continue all current medications.  Labwork:  Morning Testosterone, FSH, LH - orders given today.  Office will contact with results via phone or letter.    Testing/Procedures: none  Follow-Up: Your physician wants you to follow up in: 6 months.  You will receive a reminder letter in the mail one-two months in advance.  If you don't receive a letter, please call our office to schedule the follow up appointment   Any Other Special Instructions Will Be Listed Below (If Applicable).  If you need a refill on your cardiac medications before your next appointment, please call your pharmacy.

## 2017-03-19 ENCOUNTER — Ambulatory Visit: Payer: Self-pay | Admitting: Cardiovascular Disease

## 2017-03-31 ENCOUNTER — Telehealth: Payer: Self-pay | Admitting: *Deleted

## 2017-03-31 DIAGNOSIS — E291 Testicular hypofunction: Secondary | ICD-10-CM

## 2017-03-31 NOTE — Telephone Encounter (Signed)
Notes recorded by Laurine Blazer, LPN on 38/06/7709 at 6:57 PM EDT Patient notified. Patient agrees to endocrinology referral. Will put referral in & fwd to [facility]) for notification. ------  Notes recorded by Herminio Commons, MD on 03/29/2017 at 12:39 PM EDT He has primary hypogonadism as evidenced by low free testosterone and elevated FSH and LH (hence, the pituitary gland is functioning normally). I would consider a referral to endocrinology. Please forward this to PCP as well.

## 2017-03-31 NOTE — Telephone Encounter (Signed)
Labs fwd to pmd also.

## 2017-04-22 ENCOUNTER — Other Ambulatory Visit: Payer: Self-pay | Admitting: Adult Health

## 2017-04-28 ENCOUNTER — Ambulatory Visit (INDEPENDENT_AMBULATORY_CARE_PROVIDER_SITE_OTHER): Payer: Medicare Other | Admitting: "Endocrinology

## 2017-04-28 ENCOUNTER — Encounter: Payer: Self-pay | Admitting: "Endocrinology

## 2017-04-28 VITALS — BP 134/66 | HR 98 | Ht 69.0 in | Wt 219.0 lb

## 2017-04-28 DIAGNOSIS — E1165 Type 2 diabetes mellitus with hyperglycemia: Secondary | ICD-10-CM

## 2017-04-28 DIAGNOSIS — E291 Testicular hypofunction: Secondary | ICD-10-CM

## 2017-04-28 NOTE — Patient Instructions (Signed)

## 2017-04-28 NOTE — Progress Notes (Signed)
Consult Note                                            04/28/2017, 5:02 PM   Subjective:    Patient ID: Herbert May, male    DOB: [birth year], PCP Rosita Fire, MD   Past Medical History:  Diagnosis Date  . Allergy   . Arthritis   . Asthma   . CAD (coronary artery disease) of artery bypass graft 12/27/2012  . Colostomy status ([place])   . COPD (chronic obstructive pulmonary disease) ([place])   . Diabetes mellitus without complication ([place])   . Emphysema of lung ([place])   . GERD (gastroesophageal reflux disease)   . Heart attack ([place])   . Hyperlipidemia   . Hypertension   . Oxygen deficiency    Past Surgical History:  Procedure Laterality Date  . BACK SURGERY    . cardiac stents    . COLOSTOMY    . COLOSTOMY CLOSURE    . HERNIA REPAIR    . KNEE SURGERY    . NOSE SURGERY     Social History   Social History  . Marital status: Married    Spouse name: N/A  . Number of children: N/A  . Years of education: N/A   Social History Main Topics  . Smoking status: Former Smoker    Packs/day: 1.00    Years: 37.00    Types: Cigarettes    Start date: 12/30/1953    Quit date: 12/28/1990  . Smokeless tobacco: Former Systems developer    Quit date: 06/30/1983  . Alcohol use 0.0 oz/week     Comment: occasional  . Drug use: No  . Sexual activity: Not on file   Other Topics Concern  . Not on file   Social History Narrative  . No narrative on file   Outpatient Encounter Prescriptions as of 04/28/2017  Medication Sig  . albuterol (PROAIR HFA) 108 (90 BASE) MCG/ACT inhaler Inhale 1 puff into the lungs every 4 (four) hours as needed for wheezing or shortness of breath.  Marland Kitchen albuterol (PROVENTIL) (2.5 MG/3ML) 0.083% nebulizer solution INHALE CONTENTS OF 1 VIAL IN NEBULIZER EVERY 6 HOURS AS NEEDED FOR WHEEZING OR SHORTNESS OF BREATH.  Marland Kitchen diltiazem (CARDIZEM) 30 MG tablet Take 1 tablet (30 mg total) by mouth 2 (two) times daily.  . fentaNYL (DURAGESIC - DOSED MCG/HR) 100 MCG/HR Apply one  patch to skin every 3 days if tolerated  . fluticasone (FLONASE) 50 MCG/ACT nasal spray Place 2 sprays into both nostrils daily.  Marland Kitchen gabapentin (NEURONTIN) 300 MG capsule Take 1 capsule by mouth 3 (three) times daily.  . metFORMIN (GLUCOPHAGE) 500 MG tablet Take 500 mg by mouth 2 (two) times daily with a meal.  . nitroGLYCERIN (NITROSTAT) 0.4 MG SL tablet DISSOLVE ONE TABLET UNDER TONGUE EVERY 5 MINUTES UP TO 3 DOSES AS NEEDED FOR CHEST PAIN  . oxyCODONE-acetaminophen (PERCOCET) 10-325 MG tablet Limit one tablet by mouth per day or 2-4 times per day for breakthrough pain while wearing fentanyl patch if tolerated  . pantoprazole (PROTONIX) 40 MG tablet Take 1 tablet (40 mg total) by mouth 2 (two) times daily.  . potassium chloride SA (K-DUR,KLOR-CON) 20 MEQ tablet Take 20 mEq by mouth daily.  . simvastatin (ZOCOR) 40 MG tablet TAKE ONE TABLET BY MOUTH DAILY AT 6 PM.  . traZODone (DESYREL) 100  MG tablet TAKE ONE TABLET BY MOUTH AT BEDTIME AS NEEDED FOR SLEEP (Patient taking differently: TAKE ONE TABLET BY MOUTH AT BEDTIME FOR SLEEP)  . [DISCONTINUED] testosterone cypionate (DEPOTESTOTERONE CYPIONATE) 100 MG/ML injection Inject 100 mg into the muscle every 14 (fourteen) days. For IM use only  . Fluticasone-Umeclidin-Vilant (TRELEGY ELLIPTA) 100-62.5-25 MCG/INH AEPB Inhale 1 puff into the lungs daily.   Facility-Administered Encounter Medications as of 04/28/2017  Medication  . lactated ringers infusion 1,000 mL  . lactated ringers infusion 1,000 mL   ALLERGIES: Allergies  Allergen Reactions  . Aspirin Hives and Itching  . Fish Allergy Nausea And Vomiting  . Latex Other (See Comments)    Tears Skin   . Onion Nausea And Vomiting  . Other Other (See Comments)    Just doesn't like [place]   . Levaquin [Levofloxacin In D5w] Rash  . Neosporin [Neomycin-Bacitracin Zn-Polymyx] Rash    VACCINATION STATUS: Immunization History  Administered Date(s) Administered  . Influenza Split 02/28/2016  .  Pneumococcal Polysaccharide-23 03/28/2016    HPI Herbert May is 76 y.o. male who presents today with a medical history as above. he is being seen in consultation for hypogonadism requested by Rosita Fire, MD.  - He was found to have low testosterone of 201 on 03/22/2017. He was initiated on testosterone injection 100 mg IM every other week. He also took 2 doses already. - He complains of erectile dysfunction, proggressive weight gain. - He has medical history significant for obesity, COPD, type 2 diabetes with recent A1c of 7.2% from 03/15/2017. - He is accompanied by his wife who confirms that he has snoring problem. 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 He has taken various forms of opioids for pain control, currently on oxycodone, fentanyl patch. - He denies any history of testicular injury, infection, chemotherapy, radiation.  Review of Systems  Constitutional: + weight gain, + fatigue, no subjective hyperthermia, no subjective hypothermia Eyes: no blurry vision, no xerophthalmia ENT: no sore throat, no nodules palpated in throat, no dysphagia/odynophagia, no hoarseness Cardiovascular: no Chest Pain, no Shortness of Breath, no palpitations, no leg swelling Respiratory: no cough, no SOB Gastrointestinal: no Nausea/Vomiting/Diarhhea Musculoskeletal: no muscle/joint aches Skin: no rashes Neurological: no tremors, no numbness, no tingling, no dizziness Psychiatric: + depression, + anxiety  Objective:    BP 134/66   Pulse 98   Ht 5\' 9"  (1.753 m)   Wt 219 lb (99.3 kg)   BMI 32.34 kg/m   Wt Readings from Last 3 Encounters:  04/28/17 219 lb (99.3 kg)  03/18/17 211 lb (95.7 kg)  10/30/16 205 lb (93 kg)    Physical Exam  Constitutional: + obese,  not in acute distress, normal state of mind, uses walker to get around. Eyes: PERRLA, EOMI, no exophthalmos ENT: moist mucous membranes, no thyromegaly, no cervical lymphadenopathy Cardiovascular: normal precordial  activity, Regular Rate and Rhythm, no Murmur/Rubs/Gallops Respiratory:  adequate breathing efforts, no gross chest deformity, Clear to auscultation bilaterally Gastrointestinal: abdomen soft, Non -tender, No distension, Bowel Sounds present Musculoskeletal: no gross deformities, strength intact in all four extremities Skin: moist, warm, no rashes Neurological: no tremor with outstretched hands, Deep tendon reflexes normal in all four extremities.  CMP ( most recent) CMP     Component Value Date/Time   NA 140 08/23/2015 1943   NA 138 02/08/2014 1236   K 4.1 08/23/2015 1943   CL 108 08/23/2015 1943   CO2 24 08/23/2015 1943   GLUCOSE 154 (H) 08/23/2015 1943  BUN 10 08/23/2015 1943   BUN 9 02/08/2014 1236   CREATININE 0.84 08/23/2015 1943   CREATININE 1.09 12/12/2012 1113   CALCIUM 8.8 (L) 08/23/2015 1943   PROT 6.5 02/08/2014 1236   ALBUMIN 4.5 02/08/2014 1236   AST 17 02/08/2014 1236   ALT 17 02/08/2014 1236   ALKPHOS 68 02/08/2014 1236   BILITOT 0.6 02/08/2014 1236   GFRNONAA >60 08/23/2015 1943   GFRNONAA 68 12/12/2012 1113   GFRAA >60 08/23/2015 1943   GFRAA 79 12/12/2012 1113     Diabetic Labs (most recent): Lab Results  Component Value Date   HGBA1C 7.2 03/15/2017   HGBA1C 6.4 02/08/2014   HGBA1C 6.1% 12/12/2012     Lipid Panel ( most recent) Lipid Panel     Component Value Date/Time   CHOL 160 12/12/2012 1113   TRIG 169 (H) 12/12/2012 1113   HDL 50 12/12/2012 1113   LDLCALC 76 12/12/2012 1113      Lab Results  Component Value Date   TSH 1.990 02/08/2014    Testosterone 201 (normal 264-916) from 03/22/2017.   Assessment & Plan:   1. Hypogonadism, male - And his case the testosterone level is consistent with age-related decline, associated with elevated gonadotropins. - I have discussed  diagnosis of hypogonadism,  and relevant workup of hypogonadism with him.  I also discussed adverse effects of unnecessary testosterone replacement short-term and  long-term.  - In this particular patient, testosterone replacement treatment has more risks than benefits. I discussed with him against the idea of testosterone replacement and he is in agreement.  2. Erectile dysfunction - This is most likely not caused by the decreased testosterone, erection is a function of vascular health of the corpora cavernosa, discussed with patient. - He needs to continue to follow on diabetes, hypertension, hyperlipidemia. -  He is also too  high risk to give PDE5 inhibitor.  3. Type 2 diabetes: He wishes me to comment on his diabetes care. His recent A1c 7.2% from 03/15/2017. I advised him to continue metformin 500 mg by mouth twice a day. His renal function is normal.  -  Suggestion is made for him to avoid simple carbohydrates  from his diet including Cakes, Sweet Desserts / Pastries, Ice Cream, Soda (diet and regular), Sweet Tea, Candies, Chips, Cookies, Store Bought Juices, Alcohol in Excess of  1-2 drinks a day, Artificial Sweeteners, and "Sugar-free" Products. This will help patient to have stable blood glucose profile and potentially avoid unintended weight gain.    - I did not initiate any new prescriptions today.   - I advised patient to maintain close follow up with Rosita Fire, MD for primary care needs. Follow up plan: Return in about 6 months (around 10/26/2017), or if symptoms worsen or fail to improve, for follow up with pre-visit labs.   Glade Lloyd, MD [facility] [address] [place], [place] [address] Phone: [phone]  Fax: [phone]     04/28/2017, 5:02 PM  This note was partially dictated with voice recognition software. Similar sounding words can be transcribed inadequately or may not  be corrected upon review.

## 2017-06-01 ENCOUNTER — Other Ambulatory Visit: Payer: Self-pay | Admitting: Adult Health

## 2017-06-30 NOTE — Telephone Encounter (Signed)
Received fax from [employer]. Trelegy is on patients plan list of covered drugs. PA is not needed at this time for the medication.   Reference number  [id number]

## 2017-09-29 ENCOUNTER — Other Ambulatory Visit: Payer: Self-pay | Admitting: Internal Medicine

## 2017-11-08 ENCOUNTER — Ambulatory Visit: Payer: Self-pay | Admitting: "Endocrinology

## 2017-11-08 ENCOUNTER — Encounter: Payer: Self-pay | Admitting: "Endocrinology

## 2018-01-06 ENCOUNTER — Other Ambulatory Visit: Payer: Self-pay | Admitting: Cardiovascular Disease

## 2018-02-17 ENCOUNTER — Other Ambulatory Visit: Payer: Self-pay | Admitting: Internal Medicine

## 2018-02-21 ENCOUNTER — Encounter: Payer: Self-pay | Admitting: Cardiovascular Disease

## 2018-02-21 ENCOUNTER — Ambulatory Visit (INDEPENDENT_AMBULATORY_CARE_PROVIDER_SITE_OTHER): Payer: Medicare Other | Admitting: Cardiovascular Disease

## 2018-02-21 VITALS — BP 130/60 | HR 70 | Ht 67.0 in | Wt 211.0 lb

## 2018-02-21 DIAGNOSIS — I358 Other nonrheumatic aortic valve disorders: Secondary | ICD-10-CM | POA: Diagnosis not present

## 2018-02-21 DIAGNOSIS — I10 Essential (primary) hypertension: Secondary | ICD-10-CM

## 2018-02-21 DIAGNOSIS — E785 Hyperlipidemia, unspecified: Secondary | ICD-10-CM

## 2018-02-21 DIAGNOSIS — I25118 Atherosclerotic heart disease of native coronary artery with other forms of angina pectoris: Secondary | ICD-10-CM

## 2018-02-21 DIAGNOSIS — Z955 Presence of coronary angioplasty implant and graft: Secondary | ICD-10-CM

## 2018-02-21 MED ORDER — CLOPIDOGREL BISULFATE 75 MG PO TABS: 75.0000 mg | ORAL_TABLET | Freq: Every day | ORAL | 3 refills | Status: DC

## 2018-02-21 NOTE — Progress Notes (Signed)
SUBJECTIVE: The patient presents for follow-up of coronary artery disease.  Nuclear stress test 09/09/15 demonstrated a non-reversible defect consistent with artifact. There was no reported ischemia. LVEF 62%. However, it was deemed an intermediate risk study.  Echocardiogram 09/04/15 demonstrated normal left ventricular systolic function, EF 25-85%, indeterminate diastolic function and aortic valve sclerosis without stenosis.  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  He has been feeling fairly well overall.  He had one episode of chest pain about a month ago but it did not require nitroglycerin as it spontaneously resolved.  He has been gardening quite a bit this summer.  He grows tomatoes, zucchini, and squash.  He checks his blood pressure at home and it is routinely normal.   Review of Systems: As per "subjective", otherwise negative.  Allergies  Allergen Reactions  . Aspirin Hives and Itching  . Fish Allergy Nausea And Vomiting  . Latex Other (See Comments)    Tears Skin   . Onion Nausea And Vomiting  . Other Other (See Comments)    Just doesn't like [place]   . Levaquin [Levofloxacin In D5w] Rash  . Neosporin [Neomycin-Bacitracin Zn-Polymyx] Rash    Current Outpatient Medications  Medication Sig Dispense Refill  . albuterol (PROAIR HFA) 108 (90 BASE) MCG/ACT inhaler Inhale 1 puff into the lungs every 4 (four) hours as needed for wheezing or shortness of breath. 1 Inhaler 12  . albuterol (PROVENTIL) (2.5 MG/3ML) 0.083% nebulizer solution INHALE CONTENTS OF 1 VIAL IN NEBULIZER EVERY 6 HOURS AS NEEDED FOR WHEEZING OR SHORTNESS OF BREATH. 360 mL 3  . diltiazem (CARDIZEM) 30 MG tablet Take 1 tablet (30 mg total) by mouth 2 (two) times daily. 30 tablet 11  . fentaNYL (DURAGESIC - DOSED MCG/HR) 100 MCG/HR Apply one patch to skin every 3 days if  tolerated 10 patch 0  . fluticasone (FLONASE) 50 MCG/ACT nasal spray Place 2 sprays into both nostrils daily.    Marland Kitchen gabapentin (NEURONTIN) 300 MG capsule Take 1 capsule by mouth 3 (three) times daily.    Marland Kitchen ipratropium (ATROVENT) 0.02 % nebulizer solution INHALE CONTENTS OF 1 VIAL IN NEBULIZER EVERY 6 HOURS AS NEEDED FOR WHEEZING AND SHORTNESS OF BREATH. 300 mL 6  . metFORMIN (GLUCOPHAGE) 500 MG tablet Take 500 mg by mouth 2 (two) times daily with a meal.    . nitroGLYCERIN (NITROSTAT) 0.4 MG SL tablet DISSOLVE ONE TABLET UNDER TONGUE EVERY 5 MINUTES UP TO 3 DOSES AS NEEDED FOR CHEST PAIN 25 tablet 3  . oxyCODONE-acetaminophen (PERCOCET) 10-325 MG tablet Limit one tablet by mouth per day or 2-4 times per day for breakthrough pain while wearing fentanyl patch if tolerated 120 tablet 0  . pantoprazole (PROTONIX) 40 MG tablet Take 1 tablet (40 mg total) by mouth 2 (two) times daily. 60 tablet 11  . potassium chloride SA (K-DUR,KLOR-CON) 20 MEQ tablet Take 20 mEq by mouth daily.    . simvastatin (ZOCOR) 40 MG tablet TAKE ONE TABLET BY MOUTH DAILY AT 6 PM. 30 tablet 4  . traZODone (DESYREL) 100 MG tablet TAKE ONE TABLET BY MOUTH AT BEDTIME AS NEEDED FOR SLEEP (Patient taking differently: TAKE ONE TABLET BY MOUTH AT BEDTIME FOR SLEEP) 30 tablet 3  . TRELEGY ELLIPTA 100-62.5-25 MCG/INH AEPB INHALE ONE PUFF INTO THE LUNGS DAILY. 60 each 2   Current Facility-Administered Medications  Medication Dose Route Frequency  Provider Last Rate Last Dose  . lactated ringers infusion 1,000 mL  1,000 mL Intravenous Continuous Mohammed Kindle, MD 125 mL/hr at 01/15/16 1349 1,000 mL at 01/15/16 1349  . lactated ringers infusion 1,000 mL  1,000 mL Intravenous Continuous Mohammed Kindle, MD 125 mL/hr at 02/26/16 1135 1,000 mL at 02/26/16 1135    Past Medical History:  Diagnosis Date  . Allergy   . Arthritis   . Asthma   . CAD (coronary artery disease) of artery bypass graft 12/27/2012  . Colostomy status ([place])   . COPD  (chronic obstructive pulmonary disease) ([place])   . Diabetes mellitus without complication ([place])   . Emphysema of lung ([place])   . GERD (gastroesophageal reflux disease)   . Heart attack ([place])   . Hyperlipidemia   . Hypertension   . Oxygen deficiency     Past Surgical History:  Procedure Laterality Date  . BACK SURGERY    . cardiac stents    . COLOSTOMY    . COLOSTOMY CLOSURE    . HERNIA REPAIR    . KNEE SURGERY    . NOSE SURGERY      Social History   Socioeconomic History  . Marital status: Married    Spouse name: Not on file  . Number of children: Not on file  . Years of education: Not on file  . Highest education level: Not on file  Occupational History  . Not on file  Social Needs  . Financial resource strain: Not on file  . Food insecurity:    Worry: Not on file    Inability: Not on file  . Transportation needs:    Medical: Not on file    Non-medical: Not on file  Tobacco Use  . Smoking status: Former Smoker    Packs/day: 1.00    Years: 37.00    Pack years: 37.00    Types: Cigarettes    Start date: 12/30/1953    Last attempt to quit: 12/28/1990    Years since quitting: 27.1  . Smokeless tobacco: Former Systems developer    Quit date: 06/30/1983  Substance and Sexual Activity  . Alcohol use: Yes    Alcohol/week: 0.0 standard drinks    Comment: occasional  . Drug use: No  . Sexual activity: Not on file  Lifestyle  . Physical activity:    Days per week: Not on file    Minutes per session: Not on file  . Stress: Not on file  Relationships  . Social connections:    Talks on phone: Not on file    Gets together: Not on file    Attends religious service: Not on file    Active member of club or organization: Not on file    Attends meetings of clubs or organizations: Not on file    Relationship status: Not on file  . Intimate partner violence:    Fear of current or ex partner: Not on file    Emotionally abused: Not on file    Physically abused: Not on file    Forced  sexual activity: Not on file  Other Topics Concern  . Not on file  Social History Narrative  . Not on file     Vitals:   02/21/18 1438  BP: 130/60  Pulse: 70  SpO2: 94%  Weight: 211 lb (95.7 kg)  Height: 5\' 7"  (1.702 m)    Wt Readings from Last 3 Encounters:  02/21/18 211 lb (95.7 kg)  04/28/17 219 lb (99.3 kg)  03/18/17 211 lb (95.7 kg)     PHYSICAL EXAM General: NAD HEENT: Normal. Neck: No JVD, no thyromegaly. Lungs: Diminished sounds throughout, no crackles or wheezes. CV: Regular rate and rhythm, normal S1/S2, no S3/S4, no murmur. No pretibial or periankle edema.  No carotid bruit.   Abdomen: Soft, nontender, no distention.  Neurologic: Alert and oriented.  Psych: Normal affect. Skin: Normal. Musculoskeletal: No gross deformities.    ECG: Reviewed above under Subjective   Labs: Lab Results  Component Value Date/Time   K 4.1 08/23/2015 07:43 PM   BUN 10 08/23/2015 07:43 PM   BUN 9 02/08/2014 12:36 PM   CREATININE 0.84 08/23/2015 07:43 PM   CREATININE 1.09 12/12/2012 11:13 AM   ALT 17 02/08/2014 12:36 PM   TSH 1.990 02/08/2014 12:36 PM   HGB 14.7 08/23/2015 07:43 PM     Lipids: Lab Results  Component Value Date/Time   LDLCALC 76 12/12/2012 11:13 AM   CHOL 160 12/12/2012 11:13 AM   TRIG 169 (H) 12/12/2012 11:13 AM   HDL 50 12/12/2012 11:13 AM       ASSESSMENT AND PLAN:  1. 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 His most recent cardiac catheterization from 02/16/2007 showed wide patency of the mid-LAD stent and at that time, he underwent successful stenting of the proximal LAD. There was insignificant disease in the LCx and RCA. He is allergic to ASA and reportedly intolerant to beta blockers.  I will attempt to Plavix 75 mg daily. Continue simvastatin. Stress test without ischemia 09/09/15.  2. Aortic sclerosis: Echo reviewed above. Sclerosis w/o stenosis.  3. Essential HTN: Controlled. No changes.  4. Dyslipidemia: Continue  simvastatin.   I will obtain a copy of lipids from PCP.   Disposition: Follow up 1 year.   Kate Sable, M.D., F.A.C.C.

## 2018-02-21 NOTE — Patient Instructions (Addendum)
Medication Instructions:   Begin Plavix 75mg  daily.  Continue all other medications.    Labwork: none  Testing/Procedures: none  Follow-Up: Your physician wants you to follow up in:  1 year.  You will receive a reminder letter in the mail one-two months in advance.  If you don't receive a letter, please call our office to schedule the follow up appointment   Any Other Special Instructions Will Be Listed Below (If Applicable).  If you need a refill on your cardiac medications before your next appointment, please call your pharmacy.

## 2018-02-22 ENCOUNTER — Encounter: Payer: Self-pay | Admitting: *Deleted

## 2018-03-07 ENCOUNTER — Other Ambulatory Visit: Payer: Self-pay | Admitting: Adult Health

## 2018-05-13 ENCOUNTER — Ambulatory Visit (INDEPENDENT_AMBULATORY_CARE_PROVIDER_SITE_OTHER)
Admission: RE | Admit: 2018-05-13 | Discharge: 2018-05-13 | Disposition: A | Discharge status: Home / Self Care | Payer: Medicare Other | Source: Ambulatory Visit | Attending: Internal Medicine | Admitting: Internal Medicine

## 2018-05-13 ENCOUNTER — Encounter: Payer: Self-pay | Admitting: Internal Medicine

## 2018-05-13 ENCOUNTER — Ambulatory Visit (INDEPENDENT_AMBULATORY_CARE_PROVIDER_SITE_OTHER): Payer: Medicare Other | Admitting: Internal Medicine

## 2018-05-13 ENCOUNTER — Other Ambulatory Visit (INDEPENDENT_AMBULATORY_CARE_PROVIDER_SITE_OTHER): Payer: Medicare Other

## 2018-05-13 DIAGNOSIS — J449 Chronic obstructive pulmonary disease, unspecified: Secondary | ICD-10-CM | POA: Diagnosis not present

## 2018-05-13 DIAGNOSIS — R0609 Other forms of dyspnea: Secondary | ICD-10-CM | POA: Diagnosis not present

## 2018-05-13 LAB — CBC WITH DIFFERENTIAL/PLATELET
BASOS ABS: 0.1 10*3/uL (ref 0.0–0.1)
Basophils Relative: 0.6 % (ref 0.0–3.0)
Eosinophils Absolute: 0.1 10*3/uL (ref 0.0–0.7)
Eosinophils Relative: 1.4 % (ref 0.0–5.0)
HCT: 43.5 % (ref 39.0–52.0)
HEMOGLOBIN: 14.4 g/dL (ref 13.0–17.0)
Lymphocytes Relative: 28.9 % (ref 12.0–46.0)
Lymphs Abs: 3 10*3/uL (ref 0.7–4.0)
MCHC: 33.1 g/dL (ref 30.0–36.0)
MCV: 85.5 fl (ref 78.0–100.0)
MONO ABS: 0.9 10*3/uL (ref 0.1–1.0)
Monocytes Relative: 8.9 % (ref 3.0–12.0)
Neutro Abs: 6.3 10*3/uL (ref 1.4–7.7)
Neutrophils Relative %: 60.2 % (ref 43.0–77.0)
Platelets: 344 10*3/uL (ref 150.0–400.0)
RBC: 5.09 Mil/uL (ref 4.22–5.81)
RDW: 14.9 % (ref 11.5–15.5)
WBC: 10.4 10*3/uL (ref 4.0–10.5)

## 2018-05-13 LAB — TSH: TSH: 3.05 u[IU]/mL (ref 0.35–4.50)

## 2018-05-13 LAB — BASIC METABOLIC PANEL
BUN: 19 mg/dL (ref 6–23)
CALCIUM: 8.9 mg/dL (ref 8.4–10.5)
CO2: 25 meq/L (ref 19–32)
Chloride: 104 mEq/L (ref 96–112)
Creatinine, Ser: 1.3 mg/dL (ref 0.40–1.50)
GFR: 56.84 mL/min — ABNORMAL LOW (ref >60.00)
Glucose, Bld: 328 mg/dL — ABNORMAL HIGH (ref 70–99)
POTASSIUM: 4.5 meq/L (ref 3.5–5.1)
SODIUM: 139 meq/L (ref 135–145)

## 2018-05-13 LAB — BRAIN NATRIURETIC PEPTIDE: PRO B NATRI PEPTIDE: 17 pg/mL (ref 0.0–100.0)

## 2018-05-13 MED ORDER — METHYLPREDNISOLONE ACETATE 80 MG/ML IJ SUSP
120.0000 mg | Freq: Once | INTRAMUSCULAR | Status: AC
  Administered 2018-05-13: 120 mg via INTRAMUSCULAR

## 2018-05-13 MED ORDER — FLUTTER DEVI: 0 refills | Status: DC

## 2018-05-13 MED ORDER — AMOXICILLIN-POT CLAVULANATE 875-125 MG PO TABS: 1.0000 | ORAL_TABLET | Freq: Two times a day (BID) | ORAL | 0 refills | Status: AC

## 2018-05-13 NOTE — Progress Notes (Addendum)
Subjective:   Patient ID: Herbert May, male    DOB: 09/20/1940,   MRN: [id number]    Brief patient profile:  75 yowm quit smoking in 1992 with breathing problems then that worsened over the years esp since 2011 lived in [place] and moved to [place] in  2013 on 02 since early 2016 at hs only.      History of Present Illness  01/24/2015 1st [facility] office visit/ Herbert May  On flovent/ spiriva  Chief Complaint  Patient presents with  . Pulmonary Consult    Self referral. Pt states has had SOB "all of my life"- worse x 3 months. He states he is SOB all of the time, with or without exertion. He also c/o cough- prod with yellow sputum.    gradually worse doe x 4-5 years but can still do [employer] slowly but c/o sense of sob at rest assoc with congested cough and worse symptoms walking in hot humid conditions not much better with saba but doesn't really understand how to use them  rec Ok to use the combination of albuterol/ipatriprium up to 4 x daily  Only use your albuterol as a rescue medication pantoprazole 40  Mg Take 30- 60 min before your first and last meals of the day  GERD diet   07/31/16 NP  Pt presents for an acute office visit. Complains of persistent cough, congestion , dyspnea and wheezing . Worse for last 3-4 weeks .  Recently admitted 2 weeks ago at  [facility] for COPD flare , tx w/ abx and steroids . No discharge summary at todays visit. Request sent to hospital . Since got out of hospital he is not feeling much better. Still has cough/congestion and wheezing . Seen by PCP yesterday and started Doxycycline . Coughing up thick green mucus.  No chest pain or hemoptysis. Appetitie is down. No n/v/d.  Last seen in pulmonary clinic in 2016.  Previously on Symbicort and Spiriva in past but insurance did not cover .  On Duoneb Four times a day  . Insurance covers this well.  rec Finish Doxycycline .  Mucinex DM Twice daily  As needed  Cough/congestion  Continue on  Duoneb Four times a day  .  Begin Oxygen 2l/m with activity  Order to DME for portable concentrator.  Begin BREO 1 puff daily .     10/30/2016  f/u ov/Herbert May re:  GOLD II copd / breo maint / overuse of saba noted / 02 2lpm  hs and prn with ambulation  Chief Complaint  Patient presents with  . Follow-up    PFT's done today. Breathing is unchanged. He is using proair 3-4 x per day and albuterol neb 4 x daily.   doe = MMRC2 usually s 79 rec Plan A = Automatic = try off the BREO  And replace it with Trelogy one click each am - take 2 long drags and hold 5 seconds each  Plan B = Backup Only use your albuterol (Proair)  as a rescue medication  Work on inhaler technique:    Plan C = Crisis - only use your albuterol/ipatropium nebulizer if you first try Plan B    ER in [place]  And Nov 2019 for copd / fast heart rate > last in  ER 05/04/18 rec pred x 5 day    05/13/2018 acute extended ov/Herbert May re: re-establish for COPD GOLD II / sob/cough some better p last ER trip / now maint on Trelegy  Chief Complaint  Patient presents with  . Acute Visit    Pt c/o increased SOB and cough for the past few months. His cough is prod with green sputum.  He is SOB with minimal exertion and also when he lies down. He is using his proair and albuterol neb each 4 x daily on average.   doe =  MMRC3 = can't walk 100 yards even at a slow pace at a flat grade s stopping due to sob, still walks to mb most days stops 4-5 x on way back  Cough  First thing am / green and thick x up to 15 min to clear it  Sleep R side down 2 pillows/ bed flat  SABA: way over using at baseline    No obvious patterns in day to day or daytime variability or assoc   mucus plugs or hemoptysis or cp or chest tightness, subjective wheeze or overt sinus or hb symptoms.   Sleeping as above  without nocturnal   exacerbation  of respiratory  c/o's or need for noct saba. Also denies any obvious fluctuation of symptoms with weather or environmental  changes or other aggravating or alleviating factors except as outlined above   No unusual exposure hx or h/o childhood pna/ asthma or knowledge of premature birth.  Current Allergies, Complete Past Medical History, Past Surgical History, Family History, and Social History were reviewed in [employer] record.  ROS  The following are not active complaints unless bolded Hoarseness, sore throat, dysphagia, dental problems, itching, sneezing,  nasal congestion or discharge of excess mucus or purulent secretions, ear ache,   fever, chills, sweats, unintended wt loss or wt gain, classically pleuritic or exertional cp,  orthopnea pnd or arm/hand swelling  or leg swelling, presyncope, palpitations, abdominal pain, anorexia, nausea, vomiting, diarrhea  or change in bowel habits or change in bladder habits, change in stools or change in urine, dysuria, hematuria,  rash, arthralgias, visual complaints, headache, numbness, weakness or ataxia or problems with walking or coordination,  change in mood or  memory.        Current Meds  Medication Sig  . albuterol (PROAIR HFA) 108 (90 BASE) MCG/ACT inhaler Inhale 1 puff into the lungs every 4 (four) hours as needed for wheezing or shortness of breath.  Marland Kitchen albuterol (PROVENTIL) (2.5 MG/3ML) 0.083% nebulizer solution INHALE CONTENTS OF 1 VIAL IN NEBULIZER EVERY 6 HOURS AS NEEDED FOR WHEEZING OR SHORTNESS OF BREATH.  . clopidogrel (PLAVIX) 75 MG tablet Take 1 tablet (75 mg total) by mouth daily.  Marland Kitchen diltiazem (CARDIZEM) 30 MG tablet Take 1 tablet (30 mg total) by mouth 2 (two) times daily.  . fentaNYL (DURAGESIC - DOSED MCG/HR) 100 MCG/HR Apply one patch to skin every 3 days if tolerated  . fluticasone (FLONASE) 50 MCG/ACT nasal spray Place 2 sprays into both nostrils daily.  Marland Kitchen gabapentin (NEURONTIN) 300 MG capsule Take 1 capsule by mouth 3 (three) times daily.  . metFORMIN (GLUCOPHAGE) 500 MG tablet Take 500 mg by mouth 2 (two) times daily with a  meal.  . nitroGLYCERIN (NITROSTAT) 0.4 MG SL tablet DISSOLVE ONE TABLET UNDER TONGUE EVERY 5 MINUTES UP TO 3 DOSES AS NEEDED FOR CHEST PAIN  . oxyCODONE-acetaminophen (PERCOCET) 10-325 MG tablet Limit one tablet by mouth per day or 2-4 times per day for breakthrough pain while wearing fentanyl patch if tolerated  . pantoprazole (PROTONIX) 40 MG tablet Take 1 tablet (40 mg total) by mouth 2 (two) times daily.  . potassium chloride  SA (K-DUR,KLOR-CON) 20 MEQ tablet Take 20 mEq by mouth daily.  . simvastatin (ZOCOR) 40 MG tablet TAKE ONE TABLET BY MOUTH DAILY AT 6 PM.  . traZODone (DESYREL) 100 MG tablet TAKE ONE TABLET BY MOUTH AT BEDTIME AS NEEDED FOR SLEEP (Patient taking differently: TAKE ONE TABLET BY MOUTH AT BEDTIME FOR SLEEP)  . TRELEGY ELLIPTA 100-62.5-25 MCG/INH AEPB INHALE ONE PUFF INTO THE LUNGS DAILY.  . [DISCONTINUED] ipratropium (ATROVENT) 0.02 % nebulizer solution INHALE CONTENTS OF 1 VIAL IN NEBULIZER EVERY 6 HOURS AS NEEDED FOR WHEEZING AND SHORTNESS OF BREATH.            Objective:   Physical Exam  Obese hoarse wm walks with cane/ chewing double mint gum   05/13/2018     211 10/30/2016         205   09/11/16 210 lb 3.2 oz (95.3 kg)  07/31/16 206 lb 9.6 oz (93.7 kg)  03/19/16 215 lb (97.5 kg)        Vital signs reviewed - Note on arrival 02 sats  91% on RA       HEENT: Edentulous / nl  oropharynx. Nl external ear canals without cough reflex -  Mild bilateral non-specific turbinate edema     NECK :  without JVD/Nodes/TM/ nl carotid upstrokes bilaterally   LUNGS: no acc muscle use,  Mild barrel  contour chest wall with bilateral insp / exp rhonchi   without cough on insp or exp maneuver and mild  Hyperresonant  to  percussion bilaterally     CV:  RRR  no s3 or murmur or increase in P2, and no edema   ABD: Pot belly contour/  nontender with pos mid insp Hoover's  in the supine position. No bruits or organomegaly appreciated, bowel sounds nl  MS:   Nl gait/  ext  warm without deformities, calf tenderness, cyanosis or clubbing No obvious joint restrictions   SKIN: warm and dry without lesions    NEURO:  alert, approp, nl sensorium with  no motor or cerebellar deficits apparent.        CXR PA and Lateral:   05/13/2018 :    I personally reviewed images and agree with radiology impression as follows:   No active cardiopulmonary disease. Chronic bilateral lower lung fields scarring versus atelectasis.    Labs ordered/ reviewed:      Chemistry      Component Value Date/Time   NA 139 05/13/2018 0920   NA 138 02/08/2014 1236   K 4.5 05/13/2018 0920   CL 104 05/13/2018 0920   CO2 25 05/13/2018 0920   BUN 19 05/13/2018 0920   BUN 9 02/08/2014 1236   CREATININE 1.30 05/13/2018 0920   CREATININE 1.09 12/12/2012 1113      Component Value Date/Time   CALCIUM 8.9 05/13/2018 0920   ALKPHOS 68 02/08/2014 1236   AST 17 02/08/2014 1236   ALT 17 02/08/2014 1236   BILITOT 0.6 02/08/2014 1236        Lab Results  Component Value Date   WBC 10.4 05/13/2018   HGB 14.4 05/13/2018   HCT 43.5 05/13/2018   MCV 85.5 05/13/2018   PLT 344.0 05/13/2018       EOS  0.1                                    05/13/2018       Lab Results  Component Value Date   TSH 3.05 05/13/2018     Lab Results  Component Value Date   PROBNP 17.0 05/13/2018          Assessment & Plan:

## 2018-05-13 NOTE — Patient Instructions (Addendum)
Plan A = Automatic =  Continue Trelogy one click each am - take 2 long drags and hold 5 seconds each   Plan B = Backup Only use your albuterol (Proair)  as a rescue medication to be used if you can't catch your breath by resting or doing a relaxed purse lip breathing pattern.  - The less you use it, the better it will work when you need it. - Ok to use the inhaler up to 2 puffs  every 4 hours if you must but call for appointment if use goes up over your usual need - Don't leave home without it !!  (think of it like the spare tire for your car)   Work on inhaler technique:  relax and gently blow all the way out then take a nice smooth deep breath back in, triggering the inhaler at same time you start breathing in.  Hold for up to 5 seconds if you can.  Rinse and gargle with water when done    Plan C = Crisis - only use your albuterol nebulizer if you first try Plan B and it fails to help > ok to use the nebulizer up to every 4 hours but if start needing it regularly call for immediate appointment   Protonix (pantoprazole ) should  Be Take 30- 60 min before your first and last meals of the day   GERD (REFLUX)  is an extremely common cause of respiratory symptoms just like yours , many times with no obvious heartburn at all.    It can be treated with medication, but also with lifestyle changes including elevation of the head of your bed (ideally with 6 inch  bed blocks),  Smoking cessation, avoidance of late meals, excessive alcohol, and avoid fatty foods, chocolate, peppermint, colas, red wine, and acidic juices such as orange juice.  NO MINT OR MENTHOL PRODUCTS SO NO COUGH DROPS   USE SUGARLESS CANDY INSTEAD (Jolley ranchers or Stover's or Life Savers) or even ice chips will also do - the key is to swallow to prevent all throat clearing. NO OIL BASED VITAMINS - use powdered substitutes.    Augmentin 875 mg take one pill twice daily  X 10 days - take at breakfast and supper with large glass of  water.  It would help reduce the usual side effects (diarrhea and yeast infections) if you ate cultured yogurt at lunch.   For cough > mucinex or mucinex dm up to 1200 mg every 12 hours as needed and use the flutter valve as much as you can    Depomedrol 120 mg IM today    Please remember to go to the lab and x-ray department downstairs in the basement  for your tests - we will call you with the results when they are available.          Please schedule a follow up office visit in 4 weeks, sooner if needed  with all medications /inhalers/ solutions in hand so we can verify exactly what you are taking. This includes all medications from all doctors and over the counters Add:  Inquire about what happed to 02 and check alpha one AT @ next ov

## 2018-05-14 ENCOUNTER — Encounter: Payer: Self-pay | Admitting: Internal Medicine

## 2018-05-14 NOTE — Assessment & Plan Note (Addendum)
- quit smoking 1992  - MMRC 2 at first pulmonary ov 01/24/15  - 01/24/2015  Walked RA x 3 laps @ 185 ft each stopped due to  End of study, slow pace, no sob or desat   - 01/24/2015 p extensive coaching HFA effectiveness =   75% > try symbicort 160 2bid > pt d/c'd on his own  - PFT's  02/07/2015  FEV1 1.44 (52 % ) ratio 52  p no % improvement from saba with DLCO  58 % corrects to 85 % for alv volume   - 09/11/2016  Try BREO - PFT's  10/30/2016  FEV1 1.63 (61 % ) ratio 56  p no % improvement from saba p duob around 4 h prior to study with DLCO  70/69 % corrects to 90 % for alv volume     - 10/30/2016 trial of Trelogy as using way too much saba at baseline > still overusing saba as of 05/13/2018  - 05/13/2018  After extensive coaching inhaler device,  effectiveness =    75% (short Ti) -  05/13/2018 added flutter valve    DDX of  difficult airways management almost all start with A and  include Adherence, Ace Inhibitors, Acid Reflux, Active Sinus Disease, Alpha 1 Antitripsin deficiency, Anxiety masquerading as Airways dz,  ABPA,  Allergy(esp in young), Aspiration (esp in elderly), Adverse effects of meds,  Active smoking or vaping, A bunch of PE's (a small clot burden can't cause this syndrome unless there is already severe underlying pulm or vascular dz with poor reserve) plus two Bs  = Bronchiectasis and Beta blocker use..and one C= CHF   Adherence is always the initial "prime suspect" and is a multilayered concern that requires a "trust but verify" approach in every patient - starting with knowing how to use medications, especially inhalers, correctly, keeping up with refills and understanding the fundamental difference between maintenance and prns vs those medications only taken for a very short course and then stopped and not refilled.  - overusing saba/ poor hfa - see hfa teaching/ continue trelegy and add flutter/ trained on both  - return with all meds in hand using a trust but verify approach to  confirm accurate Medication  Reconciliation The principal here is that until we are certain that the  patients are doing what we've asked, it makes no sense to ask them to do more.    ? Acid (or non-acid) GERD > always difficult to exclude as up to 75% of pts in some series report no assoc GI/ Heartburn symptoms> rec max (24h)  acid suppression and diet restrictions/ reviewed and instructions given in writing.   NO MINT GUM  ? Active sinus dz > continue flonase, add augmentin x 10 days, consider sinus ct if am green mucus not better   ? Allergy/ asthma > continue ICS/ trelegy plus depomedrol 120 mg IM   ? Adverse effects of meds > none listed but atrovent redundant at this point and may be contributing to dry mouth > d/c  ? Alpha one def > check next ov as could not find in epic   ? Anxiety/depression  > usually at the bottom of this list of usual suspects but should be   on this pt's based on H and P and note already on psychotropics and may interfere with adherence and also interpretation of response or lack thereof to symptom management which can be quite subjective.   ? Bronchiectasis > add flutter valve empirically as  he's acting more and more like bronchiectatic > augmentin x 10 days and consider HRCT next ov   ? CHF/ cardiac asthma > ruled out with bnp so low today     I had an extended discussion with the patient reviewing all relevant studies completed to date and  lasting 25 minutes of a 40  minute acute office visit to re-establish p > 1 year absence and multiple ER visits      re  severe non-specific but potentially very serious refractory respiratory symptoms of uncertain and potentially multiple  Etiologies.   See device teaching (both hfa  And flutter valve) which extended face to face time for this visit    Each maintenance medication was reviewed in detail including most importantly the difference between maintenance and prns and under what circumstances the prns are to be  triggered using an action plan format that is not reflected in the computer generated alphabetically organized AVS.    Please see AVS for specific instructions unique to this office visit that I personally wrote and verbalized to the the pt in detail and then reviewed with pt  by my nurse highlighting any changes in therapy/plan of care  recommended at today's visit.

## 2018-05-16 ENCOUNTER — Telehealth: Payer: Self-pay | Admitting: Internal Medicine

## 2018-05-16 DIAGNOSIS — J449 Chronic obstructive pulmonary disease, unspecified: Secondary | ICD-10-CM

## 2018-05-16 NOTE — Progress Notes (Signed)
LMTCB

## 2018-05-16 NOTE — Telephone Encounter (Signed)
Left message for patient to call back  

## 2018-05-16 NOTE — Telephone Encounter (Signed)
The last notes on 02 need were from over a year ago and he was supposed to stay to it based on that chart entry but this means if it was stopped,   it wasn't stopped by me,  and he will need re-certifications which we did not know we needed to do at the last ov  It may be that if he follows the instruction given his 02 will improve and if not he will need ov to requalify

## 2018-05-16 NOTE — Telephone Encounter (Signed)
Called and spoke to patient wife, wife states the patient is supposed to be getting oxygen and she wanted an update on the order. Nothing is mentioned in recent AVS.   MW please advise if patient was supposed to be getting oxygen.

## 2018-05-17 NOTE — Telephone Encounter (Signed)
Yes fine for ONO RA

## 2018-05-17 NOTE — Progress Notes (Signed)
Spoke with pt and notified of results per Dr. Wert. Pt verbalized understanding and denied any questions. 

## 2018-05-17 NOTE — Telephone Encounter (Signed)
Spoke with pt. He is aware that will order this test for him. Order has been placed. Nothing further was needed at this time.

## 2018-05-17 NOTE — Telephone Encounter (Signed)
Called and spoke with pt in regards to the O2. Asked pt when he felt like he might need to have the O2 and pt stated that he felt like he needed to have it at night while sleeping but also during the daytime.   Pt does currently have an OV scheduled with MW in December and I stated to pt at that [place] we could do a walk to see if he definitely needed to have O2 to wear during the day with exertion.  Dr. Melvyn Novas, please advise if it is okay to place an order for an ONO to be done to see if pt will need to wear O2 at night?

## 2018-05-21 ENCOUNTER — Encounter: Payer: Self-pay | Admitting: Internal Medicine

## 2018-05-30 ENCOUNTER — Telehealth: Payer: Self-pay | Admitting: Internal Medicine

## 2018-05-30 DIAGNOSIS — J9611 Chronic respiratory failure with hypoxia: Secondary | ICD-10-CM

## 2018-05-30 NOTE — Telephone Encounter (Signed)
ONO on RA was pos for desat (done by [facility] 05/21/18) Per MW- start 2lpm o2 with sleep  Spoke with pt and notified of results per Dr. Melvyn Novas. Pt verbalized understanding and denied any questions. Order for o2 sent to [facility]

## 2018-06-03 ENCOUNTER — Other Ambulatory Visit: Payer: Self-pay | Admitting: Internal Medicine

## 2018-06-10 ENCOUNTER — Encounter: Payer: Self-pay | Admitting: Internal Medicine

## 2018-06-10 ENCOUNTER — Ambulatory Visit (INDEPENDENT_AMBULATORY_CARE_PROVIDER_SITE_OTHER): Payer: Medicare Other | Admitting: Internal Medicine

## 2018-06-10 VITALS — BP 130/72 | HR 80 | Ht 67.0 in | Wt 214.0 lb

## 2018-06-10 DIAGNOSIS — J449 Chronic obstructive pulmonary disease, unspecified: Secondary | ICD-10-CM | POA: Diagnosis not present

## 2018-06-10 DIAGNOSIS — J9611 Chronic respiratory failure with hypoxia: Secondary | ICD-10-CM | POA: Diagnosis not present

## 2018-06-10 NOTE — Assessment & Plan Note (Addendum)
-   quit smoking 1992  - MMRC 2 at first pulmonary ov 01/24/15  - 01/24/2015  Walked RA x 3 laps @ 185 ft each stopped due to  End of study, slow pace, no sob or desat   - 01/24/2015 p extensive coaching HFA effectiveness =   75% > try symbicort 160 2bid > pt d/c'd on his own  - PFT's  02/07/2015  FEV1 1.44 (52 % ) ratio 52  p no % improvement from saba with DLCO  58 % corrects to 85 % for alv volume   - 09/11/2016  Try BREO - PFT's  10/30/2016  FEV1 1.63 (61 % ) ratio 56  p no % improvement from saba p duob around 4 h prior to study with DLCO  70/69 % corrects to 90 % for alv volume    - 10/30/2016 trial of Trelogy as using way too much saba at baseline > still overusing saba as of 05/13/2018  - 05/13/2018 added flutter   06/10/2018  Alpha one AT screening  06/10/2018  After extensive coaching inhaler device,  effectiveness =  90%   He continues to struggle with following instruction but has improved with HFA, just needs to understand / implement the ABC format and call if this is not effective for immediate f/u ov  - otherwise we can see him q 6 m with all meds in hand using a trust but verify approach to confirm accurate Medication  Reconciliation The principal here is that until we are certain that the  patients are doing what we've asked, it makes no sense to ask them to do more.      I had an extended discussion with the patient   reviewing all relevant studies completed to date and  lasting 15 to 20 minutes of a 25 minute visit  which included directly observing ambulatory 02 saturation study documented in a/p section of  today's  office note.  See device teaching which extended face to face time for this visit   Each maintenance medication was reviewed in detail including most importantly the difference between maintenance and prns and under what circumstances the prns are to be triggered using an action plan format that is not reflected in the computer generated alphabetically organized AVS.      Please see AVS for specific instructions unique to this visit that I personally wrote and verbalized to the the pt in detail and then reviewed with pt  by my nurse highlighting any changes in therapy recommended at today's visit .

## 2018-06-10 NOTE — Progress Notes (Signed)
Subjective:   Patient ID: Herbert May, male    DOB: [birth year],   MRN: [id number]    Brief patient profile:  42 yowm quit smoking in 1992 with breathing problems then that worsened over the years esp since 2011 lived in [place] and moved to [place] in  2013 on 02 since early 2016 at hs only with GOLD II criteria 11/09/16       History of Present Illness  01/24/2015 1st [facility] office visit/ Kateline Kinkade  On flovent/ spiriva  Chief Complaint  Patient presents with  . Pulmonary Consult    Self referral. Pt states has had SOB "all of my life"- worse x 3 months. He states he is SOB all of the time, with or without exertion. He also c/o cough- prod with yellow sputum.    gradually worse doe x 4-5 years but can still do [employer] slowly but c/o sense of sob at rest assoc with congested cough and worse symptoms walking in hot humid conditions not much better with saba but doesn't really understand how to use them  rec Ok to use the combination of albuterol/ipatriprium up to 4 x daily  Only use your albuterol as a rescue medication pantoprazole 40  Mg Take 30- 60 min before your first and last meals of the day  GERD diet   07/31/16 NP  Pt presents for an acute office visit. Complains of persistent cough, congestion , dyspnea and wheezing . Worse for last 3-4 weeks .  Recently admitted 2 weeks ago at  [facility] for COPD flare , tx w/ abx and steroids . No discharge summary at todays visit. Request sent to hospital . Since got out of hospital he is not feeling much better. Still has cough/congestion and wheezing . Seen by PCP yesterday and started Doxycycline . Coughing up thick green mucus.  No chest pain or hemoptysis. Appetitie is down. No n/v/d.  Last seen in pulmonary clinic in 2016.  Previously on Symbicort and Spiriva in past but insurance did not cover .  On Duoneb Four times a day  . Insurance covers this well.  rec Finish Doxycycline .  Mucinex DM Twice daily  As needed   Cough/congestion  Continue on Duoneb Four times a day  .  Begin Oxygen 2l/m with activity  Order to DME for portable concentrator.  Begin BREO 1 puff daily .     10/30/2016  f/u ov/Annalysa Mohammad re:  GOLD II copd / breo maint / overuse of saba noted / 02 2lpm  hs and prn with ambulation  Chief Complaint  Patient presents with  . Follow-up    PFT's done today. Breathing is unchanged. He is using proair 3-4 x per day and albuterol neb 4 x daily.   doe = MMRC2 usually s 44 rec Plan A = Automatic = try off the BREO  And replace it with Trelogy one click each am - take 2 long drags and hold 5 seconds each  Plan B = Backup Only use your albuterol (Proair)  as a rescue medication  Work on inhaler technique:    Plan C = Crisis - only use your albuterol/ipatropium nebulizer if you first try Plan B    ER in [place]  And Nov 2019 for copd / fast heart rate > last in  ER 05/04/18 rec pred x 5 day    05/13/2018 acute extended ov/Armistead Sult re: re-establish for COPD GOLD II / sob/cough some better p last ER trip / now  maint on Trelegy  Chief Complaint  Patient presents with  . Acute Visit    Pt c/o increased SOB and cough for the past few months. His cough is prod with green sputum.  He is SOB with minimal exertion and also when he lies down. He is using his proair and albuterol neb each 4 x daily on average.   doe =  MMRC3 = can't walk 100 yards even at a slow pace at a flat grade s stopping due to sob, still walks to mb most days stops 4-5 x on way back  Cough  First thing am / green and thick x up to 15 min to clear it  Sleep R side down 2 pillows/ bed flat  SABA: way over using at baseline  rec Plan A = Automatic =  Continue Trelogy one click each am - take 2 long drags and hold 5 seconds each  Plan B = Backup Only use your albuterol (Proair)   Work on inhaler technique  Plan C = Crisis - only use your albuterol nebulizer if you first try Plan B and it fails to help > ok to use the nebulizer up to  every 4 hours but if start needing it regularly call for immediate appointment Protonix (pantoprazole ) should  Be Take 30- 60 min before your first and last meals of the day  GERD diet   Augmentin 875 mg take one pill twice daily  X 10 days - take at breakfast and supper with large glass of water.  It would help reduce the usual side effects (diarrhea and yeast infections) if you ate cultured yogurt at lunch.  For cough > mucinex or mucinex dm up to 1200 mg every 12 hours as needed and use the flutter valve as much as you can  Depomedrol 120 mg IM today     04/30/18  ONO on RA was pos for desat (done by [facility] 05/21/18) Per MW- start 2lpm o2 with sleep   06/10/2018  f/u ov/Nivaan Dicenzo re: copd GOLD II/ cb features/ poor insight into how/ when to use hfa vs neb - did not bring neb med but did bring the all others  Chief Complaint  Patient presents with  . Follow-up    Breathing has improved some. He is using his albuterol inhaler and neb both 2 x daily.   Dyspnea:  MMRC 3  Cough: variable denies worse in am Sleeping: fine on side on 2lpm/ did not use bed blocks as rec  SABA use: as above, still misunderstanding how to use hfa vs neb but  02: 2lpm hs only   No obvious day to day or daytime variability or assoc  purulent sputum or mucus plugs or hemoptysis or cp or chest tightness, subjective wheeze or overt sinus or hb symptoms.   Sleeping on 02 as above without nocturnal  or early am exacerbation  of respiratory  c/o's or need for noct saba. Also denies any obvious fluctuation of symptoms with weather or environmental changes or other aggravating or alleviating factors except as outlined above   No unusual exposure hx or h/o childhood pna/ asthma or knowledge of premature birth.  Current Allergies, Complete Past Medical History, Past Surgical History, Family History, and Social History were reviewed in [employer] record.  ROS  The following are not active complaints  unless bolded Hoarseness, sore throat, dysphagia, dental problems, itching, sneezing,  nasal congestion or discharge of excess mucus or purulent secretions, ear ache,  fever, chills, sweats, unintended wt loss or wt gain, classically pleuritic or exertional cp,  orthopnea pnd or arm/hand swelling  or leg swelling, presyncope, palpitations, abdominal pain, anorexia, nausea, vomiting, diarrhea  or change in bowel habits or change in bladder habits, change in stools or change in urine, dysuria, hematuria,  rash, arthralgias, visual complaints, headache, numbness, weakness or ataxia or problems with walking or coordination,  change in mood or  memory.        Current Meds  Medication Sig  . albuterol (PROAIR HFA) 108 (90 BASE) MCG/ACT inhaler Inhale 1 puff into the lungs every 4 (four) hours as needed for wheezing or shortness of breath.  Marland Kitchen albuterol (PROVENTIL) (2.5 MG/3ML) 0.083% nebulizer solution INHALE CONTENTS OF 1 VIAL IN NEBULIZER EVERY 6 HOURS AS NEEDED FOR WHEEZING OR SHORTNESS OF BREATH.  . clopidogrel (PLAVIX) 75 MG tablet Take 1 tablet (75 mg total) by mouth daily.  Marland Kitchen diltiazem (CARDIZEM) 30 MG tablet Take 1 tablet (30 mg total) by mouth 2 (two) times daily.  . fentaNYL (DURAGESIC - DOSED MCG/HR) 100 MCG/HR Apply one patch to skin every 3 days if tolerated  . fluticasone (FLONASE) 50 MCG/ACT nasal spray Place 2 sprays into both nostrils daily.  Marland Kitchen gabapentin (NEURONTIN) 300 MG capsule Take 1 capsule by mouth 3 (three) times daily.  . metFORMIN (GLUCOPHAGE) 500 MG tablet Take 500 mg by mouth 2 (two) times daily with a meal.  . nitroGLYCERIN (NITROSTAT) 0.4 MG SL tablet DISSOLVE ONE TABLET UNDER TONGUE EVERY 5 MINUTES UP TO 3 DOSES AS NEEDED FOR CHEST PAIN  . oxyCODONE-acetaminophen (PERCOCET) 10-325 MG tablet Limit one tablet by mouth per day or 2-4 times per day for breakthrough pain while wearing fentanyl patch if tolerated  . OXYGEN 2 lpm with sleep  . pantoprazole (PROTONIX) 40 MG tablet  Take 1 tablet (40 mg total) by mouth 2 (two) times daily.  . potassium chloride SA (K-DUR,KLOR-CON) 20 MEQ tablet Take 20 mEq by mouth daily.  . pravastatin (PRAVACHOL) 40 MG tablet Take 1 tablet by mouth daily.  Marland Kitchen Respiratory Therapy Supplies (FLUTTER) DEVI Use as directed  . traZODone (DESYREL) 100 MG tablet TAKE ONE TABLET BY MOUTH AT BEDTIME AS NEEDED FOR SLEEP (Patient taking differently: TAKE ONE TABLET BY MOUTH AT BEDTIME FOR SLEEP)  . TRELEGY ELLIPTA 100-62.5-25 MCG/INH AEPB INHALE ONE PUFF INTO THE LUNGS DAILY.                Objective:   Physical Exam  amb wm chewing "sugarless gum" congested cough   06/10/2018     214  05/13/2018     211 10/30/2016         205   09/11/16 210 lb 3.2 oz (95.3 kg)  07/31/16 206 lb 9.6 oz (93.7 kg)  03/19/16 215 lb (97.5 kg)         Vital signs reviewed - Note on arrival 02 sats  95% on RA       HEENT: Edentulous/ nl oropharynx. Nl external ear canals without cough reflex -  Mild bilateral non-specific turbinate edema  / Modified Mallampati Score =   3/4   NECK :  without JVD/Nodes/TM/ nl carotid upstrokes bilaterally   LUNGS: no acc muscle use,  Mild barrel  contour chest wall with bilateral scattered insp/exp rhonchi    without cough on insp or exp maneuver and mild  Hyperresonant  to  percussion bilaterally     CV:  RRR  no s3 or murmur or  increase in P2, and no edema   ABD:  Quite obese  nontender with pos late insp Hoover's  in the supine position. No bruits or organomegaly appreciated, bowel sounds nl  MS:   Nl gait/  ext warm without deformities, calf tenderness, cyanosis or clubbing No obvious joint restrictions   SKIN: warm and dry without lesions    NEURO:  alert, approp, nl sensorium with  no motor or cerebellar deficits apparent.         Labs ordered 06/10/2018  [facility] AT screening       Assessment & Plan:

## 2018-06-10 NOTE — Assessment & Plan Note (Signed)
ono RA ordered  01/29/15  < 89% @ 31 m 36sec > rec 2lpm at hs  Added prn 02 with activity 07/31/16 with goal of keeping sats > 90% ONO 05/21/18 desat RA x 86 min > rec 05/30/18:  resume 2lpm  - 06/10/2018   Walked RA x one lap = 210 ft - stopped due to  Backpain, sob s desat slow to avg pace   As of 06/10/2018 needs 2lpm hs, no 02 o/w

## 2018-06-10 NOTE — Patient Instructions (Signed)
Please remember to go to the lab department   for your tests - we will call you with the results when they are available.      Protonix is supposed to be Take 30- 60 min before your first and last meals of the day   For cough > mucinex or mucinex dm up to 1200 mg every 12 hours as needed and use the flutter valve as much as you can    No change in medications:    Please schedule a follow up visit in 6 months but call sooner if needed with all medications in hand and gum you chew

## 2018-06-16 LAB — ALPHA-1 ANTITRYPSIN PHENOTYPE: A1 ANTITRYPSIN SER: 148 mg/dL (ref 83–199)

## 2018-06-17 ENCOUNTER — Telehealth: Payer: Self-pay | Admitting: Internal Medicine

## 2018-06-17 NOTE — Telephone Encounter (Signed)
Called and spoke with patient, he was returning a call in regards to his lab work. Lab work given to patient, he is aware and verbalized understanding. Nothing further needed.

## 2018-06-17 NOTE — Progress Notes (Signed)
LMTCB

## 2018-06-30 ENCOUNTER — Telehealth: Payer: Self-pay | Admitting: Internal Medicine

## 2018-06-30 NOTE — Telephone Encounter (Signed)
Spoke with pt's significant other, Margaret. She is following up on the message from earlier. Advised her that unfortunately, there is not much our office can do at this time. The pt would need to be seen and be qualified for the portable oxygen. While he was in the hospital at [facility] in [place], [place] his oxygen order was changed. Erik Obey that the hospital should have sent an order to the pt's home care company to change his oxygen order. Joycelyn Schmid is going to contact someone at [facility] to see if this can be taken care of. She knows for a fact that he would not make it to an appointment here without oxygen. Erik Obey to let us know if there is anything else we can help with. Nothing further was needed at this time.

## 2018-06-30 NOTE — Telephone Encounter (Signed)
Margaret, fiance', calling back to check on status. CB is [phone].  She was advised per the note that the nurse is working on this.

## 2018-06-30 NOTE — Telephone Encounter (Signed)
Call made to patient wife, she states her husband has been hospitilized twice in the last 2 months. He previously was using 2L at night. He is now using 2-3L at all times. Patient states he only has a concentrator and would not be able to get to the office without oxygen tanks. He is requesting additional tanks to be more mobile.

## 2018-06-30 NOTE — Telephone Encounter (Signed)
Call made to [facility], Spoke with Claiborne Billings, I explained that this patient was previously using oxygen at 2L at night only. He was recently hospitilized and is now requiring oxygen all the time. The patient only has a concentrator at home and needs portable tanks to even be able to make it to a office visit to be able to qualify for oxygen. I was transferred to [place], per Jeani Hawking they cannot give this patient portable tanks without first having a office visit and being qualified.   Call made to patient to make aware and patient states he cannot get into the office without the oxygen. Made aware I would call him back once I figure out what we can do to help.

## 2018-07-04 ENCOUNTER — Encounter: Payer: Self-pay | Admitting: Primary Care

## 2018-07-04 ENCOUNTER — Ambulatory Visit (INDEPENDENT_AMBULATORY_CARE_PROVIDER_SITE_OTHER): Payer: Medicare Other | Admitting: Primary Care

## 2018-07-04 ENCOUNTER — Ambulatory Visit (INDEPENDENT_AMBULATORY_CARE_PROVIDER_SITE_OTHER)
Admission: RE | Admit: 2018-07-04 | Discharge: 2018-07-04 | Disposition: A | Discharge status: Home / Self Care | Payer: Medicare Other | Source: Ambulatory Visit | Attending: Primary Care | Admitting: Primary Care

## 2018-07-04 ENCOUNTER — Ambulatory Visit: Payer: Self-pay | Admitting: Primary Care

## 2018-07-04 VITALS — BP 120/72 | HR 108 | Temp 98.7°F | Ht 67.0 in | Wt 199.4 lb

## 2018-07-04 DIAGNOSIS — J449 Chronic obstructive pulmonary disease, unspecified: Secondary | ICD-10-CM

## 2018-07-04 DIAGNOSIS — J9611 Chronic respiratory failure with hypoxia: Secondary | ICD-10-CM | POA: Diagnosis not present

## 2018-07-04 LAB — CBC
HCT: 45.3 % (ref 39.0–52.0)
Hemoglobin: 15.2 g/dL (ref 13.0–17.0)
MCHC: 33.5 g/dL (ref 30.0–36.0)
MCV: 85 fl (ref 78.0–100.0)
PLATELETS: 382 10*3/uL (ref 150.0–400.0)
RBC: 5.33 Mil/uL (ref 4.22–5.81)
RDW: 15.5 % (ref 11.5–15.5)
WBC: 11.8 10*3/uL — ABNORMAL HIGH (ref 4.0–10.5)

## 2018-07-04 LAB — BASIC METABOLIC PANEL
BUN: 16 mg/dL (ref 6–23)
CALCIUM: 9.3 mg/dL (ref 8.4–10.5)
CO2: 25 mEq/L (ref 19–32)
Chloride: 97 mEq/L (ref 96–112)
Creatinine, Ser: 1.22 mg/dL (ref 0.40–1.50)
GFR: 61.14 mL/min (ref >60.00)
Glucose, Bld: 441 mg/dL — ABNORMAL HIGH (ref 70–99)
Potassium: 4.9 mEq/L (ref 3.5–5.1)
Sodium: 132 mEq/L — ABNORMAL LOW (ref 135–145)

## 2018-07-04 LAB — BRAIN NATRIURETIC PEPTIDE: Pro B Natriuretic peptide (BNP): 15 pg/mL (ref 0.0–100.0)

## 2018-07-04 MED ORDER — FLUTICASONE-UMECLIDIN-VILANT 100-62.5-25 MCG/INH IN AEPB: 1.0000 | INHALATION_SPRAY | Freq: Every day | RESPIRATORY_TRACT | 1 refills | Status: DC

## 2018-07-04 NOTE — Progress Notes (Signed)
@Patient  ID: Herbert May, male    DOB: 1940-07-13, 78 y.o.   MRN: [id number]  Chief Complaint  Patient presents with  . Follow-up    qualify for POC    Referring provider: Dione Housekeeper, MD  HPI: 78 year old male, former smoker. PMH significant for COPD Gold 2, chronic respiratory failure with hypoxia, coronary artery disease, hypertension, aortic stenosis, uncontrolled diabetes type 2, anemia.  Patient of Dr. Melvyn Novas, last seen on 06/10/18.   07/04/2018 Patient presents today to qualify for POC. Patient was previously using 2L oxygen at night only, recently hospitalized and requiring oxygen during the day. Patient got out of the hospital on December 31st for COPD exacerbation. Has had a productive cough with green mucus for a couple weeks. Using flutter valve sometimes. Has 2 prednisone tabs left.  Wife states that weather isn't helping. Continues Trelegy, forgot to take it this morning. Using Albuterol nebulizer three times a day. Afebrile.    Allergies  Allergen Reactions  . Aspirin Hives and Itching  . Fish Allergy Nausea And Vomiting  . Latex Other (See Comments)    Tears Skin   . Onion Nausea And Vomiting  . Other Other (See Comments)    Just doesn't like [place]   . Levaquin [Levofloxacin In D5w] Rash  . Neosporin [Neomycin-Bacitracin Zn-Polymyx] Rash    Immunization History  Administered Date(s) Administered  . Influenza Split 02/28/2016  . Influenza-Unspecified 03/15/2017, 03/25/2018  . Pneumococcal Conjugate-13 04/01/2017  . Pneumococcal Polysaccharide-23 03/28/2016    Past Medical History:  Diagnosis Date  . Allergy   . Arthritis   . Asthma   . CAD (coronary artery disease) of artery bypass graft 12/27/2012  . Colostomy status ([place])   . COPD (chronic obstructive pulmonary disease) ([place])   . Diabetes mellitus without complication ([place])   . Emphysema of lung ([place])   . GERD (gastroesophageal reflux disease)   . Heart attack ([place])   . Hyperlipidemia   .  Hypertension   . Oxygen deficiency     Tobacco History: Social History   Tobacco Use  Smoking Status Former Smoker  . Packs/day: 1.00  . Years: 37.00  . Pack years: 37.00  . Types: Cigarettes  . Start date: 12/30/1953  . Last attempt to quit: 12/28/1990  . Years since quitting: 27.5  Smokeless Tobacco Former Systems developer  . Quit date: 06/30/1983   Counseling given: Not Answered   Outpatient Medications Prior to Visit  Medication Sig Dispense Refill  . albuterol (PROAIR HFA) 108 (90 BASE) MCG/ACT inhaler Inhale 1 puff into the lungs every 4 (four) hours as needed for wheezing or shortness of breath. 1 Inhaler 12  . albuterol (PROVENTIL) (2.5 MG/3ML) 0.083% nebulizer solution INHALE CONTENTS OF 1 VIAL IN NEBULIZER EVERY 6 HOURS AS NEEDED FOR WHEEZING OR SHORTNESS OF BREATH. 360 mL 3  . clopidogrel (PLAVIX) 75 MG tablet Take 1 tablet (75 mg total) by mouth daily. 90 tablet 3  . diltiazem (CARDIZEM) 30 MG tablet Take 1 tablet (30 mg total) by mouth 2 (two) times daily. 30 tablet 11  . fentaNYL (DURAGESIC - DOSED MCG/HR) 100 MCG/HR Apply one patch to skin every 3 days if tolerated 10 patch 0  . fluticasone (FLONASE) 50 MCG/ACT nasal spray Place 2 sprays into both nostrils daily.    Marland Kitchen gabapentin (NEURONTIN) 300 MG capsule Take 1 capsule by mouth 3 (three) times daily.    . metFORMIN (GLUCOPHAGE) 500 MG tablet Take 500 mg by mouth 2 (two) times daily  with a meal.    . nitroGLYCERIN (NITROSTAT) 0.4 MG SL tablet DISSOLVE ONE TABLET UNDER TONGUE EVERY 5 MINUTES UP TO 3 DOSES AS NEEDED FOR CHEST PAIN 25 tablet 3  . oxyCODONE-acetaminophen (PERCOCET) 10-325 MG tablet Limit one tablet by mouth per day or 2-4 times per day for breakthrough pain while wearing fentanyl patch if tolerated 120 tablet 0  . OXYGEN 2 lpm with sleep    . pantoprazole (PROTONIX) 40 MG tablet Take 1 tablet (40 mg total) by mouth 2 (two) times daily. 60 tablet 11  . potassium chloride SA (K-DUR,KLOR-CON) 20 MEQ tablet Take 20 mEq by  mouth daily.    . pravastatin (PRAVACHOL) 40 MG tablet Take 1 tablet by mouth daily.    Marland Kitchen Respiratory Therapy Supplies (FLUTTER) DEVI Use as directed 1 each 0  . traZODone (DESYREL) 100 MG tablet TAKE ONE TABLET BY MOUTH AT BEDTIME AS NEEDED FOR SLEEP (Patient taking differently: TAKE ONE TABLET BY MOUTH AT BEDTIME FOR SLEEP) 30 tablet 3  . TRELEGY ELLIPTA 100-62.5-25 MCG/INH AEPB INHALE ONE PUFF INTO THE LUNGS DAILY. 60 each 2   No facility-administered medications prior to visit.     Review of Systems  Review of Systems  Constitutional: Negative for chills and fever.  HENT: Positive for congestion.   Respiratory: Positive for cough and shortness of breath.   Cardiovascular: Negative.     Physical Exam  BP 120/72 (BP Location: Left Arm, Cuff Size: Normal)   Pulse (!) 108   Temp 98.7 F (37.1 C)   Ht 5\' 7"  (1.702 m)   Wt 199 lb 6.4 oz (90.4 kg)   SpO2 90%   BMI 31.23 kg/m  Physical Exam Constitutional:      Appearance: He is obese.  HENT:     Mouth/Throat:     Mouth: Mucous membranes are moist.     Pharynx: Oropharynx is clear.  Eyes:     Extraocular Movements: Extraocular movements intact.     Pupils: Pupils are equal, round, and reactive to light.  Cardiovascular:     Rate and Rhythm: Regular rhythm. Tachycardia present.  Pulmonary:     Breath sounds: No stridor. Wheezing present.     Comments: LS diminished with scattered wheezing Neurological:     Mental Status: He is alert.      Lab Results:  CBC    Component Value Date/Time   WBC 10.4 05/13/2018 0920   RBC 5.09 05/13/2018 0920   HGB 14.4 05/13/2018 0920   HCT 43.5 05/13/2018 0920   PLT 344.0 05/13/2018 0920   MCV 85.5 05/13/2018 0920   MCV 84.0 02/08/2014 1249   MCH 28.1 08/23/2015 1943   MCHC 33.1 05/13/2018 0920   RDW 14.9 05/13/2018 0920   LYMPHSABS 3.0 05/13/2018 0920   MONOABS 0.9 05/13/2018 0920   EOSABS 0.1 05/13/2018 0920   BASOSABS 0.1 05/13/2018 0920    BMET    Component Value  Date/Time   NA 139 05/13/2018 0920   NA 138 02/08/2014 1236   K 4.5 05/13/2018 0920   CL 104 05/13/2018 0920   CO2 25 05/13/2018 0920   GLUCOSE 328 (H) 05/13/2018 0920   BUN 19 05/13/2018 0920   BUN 9 02/08/2014 1236   CREATININE 1.30 05/13/2018 0920   CREATININE 1.09 12/12/2012 1113   CALCIUM 8.9 05/13/2018 0920   GFRNONAA >60 08/23/2015 1943   GFRNONAA 68 12/12/2012 1113   GFRAA >60 08/23/2015 1943   GFRAA 79 12/12/2012 1113    BNP  Component Value Date/Time   BNP 19.8 02/13/2015 0851    ProBNP    Component Value Date/Time   PROBNP 17.0 05/13/2018 0920    Imaging: No results found.   Assessment & Plan:   COPD GOLD II  - Checking labs and CXR d/t prolonged cough with production and dyspnea  - Continue Trelegy 1 puff daily  - Albuterol nebulizer every 4-6 hours as needed for shortness of breath or wheezing - Mucinex twice a day with full glass of water - Use flutter valve 3-4 times a day  Chronic respiratory failure with hypoxia (HCC) - Ambulatory O2 sat low 87% RA - Needs 3-4L pulsed oxygen to keep O2 >90% - Referral for new POC    FU in 1 week   Martyn Ehrich, NP 07/04/2018

## 2018-07-04 NOTE — Patient Instructions (Addendum)
Mucinex twice a day with full glass of water  Use flutter valve 3-4 times a day  Albuterol nebulizer every 4-6 hours as needed for shortness of breath or wheezing  Continue Trelegy 1 puff daily   CXR and labs today  Please qualify for POC   Follow-up in 1 week with Dr. Melvyn Novas or NP

## 2018-07-04 NOTE — Assessment & Plan Note (Addendum)
-   Checking labs and CXR d/t prolonged cough with production and dyspnea  - Continue Trelegy 1 puff daily  - Albuterol nebulizer every 4-6 hours as needed for shortness of breath or wheezing - Mucinex twice a day with full glass of water - Use flutter valve 3-4 times a day

## 2018-07-04 NOTE — Assessment & Plan Note (Addendum)
-   Ambulatory O2 sat low 87% RA - Needs 3-4L pulsed oxygen to keep O2 >90% - Referral for new POC

## 2018-07-07 NOTE — Progress Notes (Signed)
Chart and office note reviewed in detail  > agree with a/p as outlined    

## 2018-07-11 ENCOUNTER — Encounter: Payer: Self-pay | Admitting: Internal Medicine

## 2018-07-11 ENCOUNTER — Ambulatory Visit (INDEPENDENT_AMBULATORY_CARE_PROVIDER_SITE_OTHER): Payer: Medicare Other | Admitting: Internal Medicine

## 2018-07-11 VITALS — BP 138/64 | HR 121 | Ht 67.0 in | Wt 205.0 lb

## 2018-07-11 DIAGNOSIS — J9611 Chronic respiratory failure with hypoxia: Secondary | ICD-10-CM | POA: Diagnosis not present

## 2018-07-11 DIAGNOSIS — J449 Chronic obstructive pulmonary disease, unspecified: Secondary | ICD-10-CM | POA: Diagnosis not present

## 2018-07-11 NOTE — Patient Instructions (Addendum)
We will see if we can troubleshoot why you don't have a portable system but only needs to be used with walking outside of house   No change in medications    Please schedule a follow up visit in 3 months but call sooner if needed

## 2018-07-11 NOTE — Progress Notes (Signed)
Subjective:   Patient ID: Herbert May, male    DOB: 05-Sep-1940,   MRN: [id number]    Brief patient profile:  49 yowm quit smoking in 1992 with breathing problems then that worsened over the years esp since 2011 lived in [place] and moved to [place] in  2013 on 02 since early 2016 at hs only with GOLD II criteria 11/09/16    History of Present Illness  01/24/2015 1st [facility] office visit/ [facility]  On flovent/ spiriva  Chief Complaint  Patient presents with  . Pulmonary Consult    Self referral. Pt states has had SOB "all of my life"- worse x 3 months. He states he is SOB all of the time, with or without exertion. He also c/o cough- prod with yellow sputum.    gradually worse doe x 4-5 years but can still do [employer] slowly but c/o sense of sob at rest assoc with congested cough and worse symptoms walking in hot humid conditions not much better with saba but doesn't really understand how to use them  rec Ok to use the combination of albuterol/ipatriprium up to 4 x daily  Only use your albuterol as a rescue medication pantoprazole 40  Mg Take 30- 60 min before your first and last meals of the day  GERD diet   07/31/16 NP  Pt presents for an acute office visit. Complains of persistent cough, congestion , dyspnea and wheezing . Worse for last 3-4 weeks .  Recently admitted 2 weeks ago at  [facility] for COPD flare , tx w/ abx and steroids . No discharge summary at todays visit. Request sent to hospital . Since got out of hospital he is not feeling much better. Still has cough/congestion and wheezing . Seen by PCP yesterday and started Doxycycline . Coughing up thick green mucus.  No chest pain or hemoptysis. Appetitie is down. No n/v/d.  Last seen in pulmonary clinic in 2016.  Previously on Symbicort and Spiriva in past but insurance did not cover .  On Duoneb Four times a day  . Insurance covers this well.  rec Finish Doxycycline .  Mucinex DM Twice daily  As needed   Cough/congestion  Continue on Duoneb Four times a day  .  Begin Oxygen 2l/m with activity  Order to DME for portable concentrator.  Begin BREO 1 puff daily .     10/30/2016  f/u ov/Ameirah Khatoon re:  GOLD II copd / breo maint / overuse of saba noted / 02 2lpm  hs and prn with ambulation  Chief Complaint  Patient presents with  . Follow-up    PFT's done today. Breathing is unchanged. He is using proair 3-4 x per day and albuterol neb 4 x daily.   doe = MMRC2 usually s 43 rec Plan A = Automatic = try off the BREO  And replace it with Trelogy one click each am - take 2 long drags and hold 5 seconds each  Plan B = Backup Only use your albuterol (Proair)  as a rescue medication  Work on inhaler technique:    Plan C = Crisis - only use your albuterol/ipatropium nebulizer if you first try Plan B    ER in [place]  And Nov 2019 for copd / fast heart rate > last in  ER 05/04/18 rec pred x 5 day    05/13/2018 acute extended ov/Reveca Desmarais re: re-establish for COPD GOLD II / sob/cough some better p last ER trip / now maint on Trelegy  Chief Complaint  Patient presents with  . Acute Visit    Pt c/o increased SOB and cough for the past few months. His cough is prod with green sputum.  He is SOB with minimal exertion and also when he lies down. He is using his proair and albuterol neb each 4 x daily on average.   doe =  MMRC3 = can't walk 100 yards even at a slow pace at a flat grade s stopping due to sob, still walks to mb most days stops 4-5 x on way back  Cough  First thing am / green and thick x up to 15 min to clear it  Sleep R side down 2 pillows/ bed flat  SABA: way over using at baseline  rec Plan A = Automatic =  Continue Trelogy one click each am - take 2 long drags and hold 5 seconds each  Plan B = Backup Only use your albuterol (Proair)   Work on inhaler technique  Plan C = Crisis - only use your albuterol nebulizer if you first try Plan B and it fails to help > ok to use the nebulizer up to  every 4 hours but if start needing it regularly call for immediate appointment Protonix (pantoprazole ) should  Be Take 30- 60 min before your first and last meals of the day  GERD diet   Augmentin 875 mg take one pill twice daily  X 10 days - take at breakfast and supper with large glass of water.  It would help reduce the usual side effects (diarrhea and yeast infections) if you ate cultured yogurt at lunch.  For cough > mucinex or mucinex dm up to 1200 mg every 12 hours as needed and use the flutter valve as much as you can  Depomedrol 120 mg IM today     04/30/18  ONO on RA was pos for desat (done by [facility] 05/21/18) Per MW- start 2lpm o2 with sleep   06/10/2018  f/u ov/Wilmoth Rasnic re: copd GOLD II/ cb features/ poor insight into how/ when to use hfa vs neb - did not bring neb med but did bring all others and miant on  trelegy  Chief Complaint  Patient presents with  . Follow-up    Breathing has improved some. He is using his albuterol inhaler and neb both 2 x daily.   Dyspnea:  MMRC 3  Cough: variable denies worse in am Sleeping: fine on side on 2lpm/ did not use bed blocks as rec  SABA use: as above, still misunderstanding how to use hfa vs neb but  02: 2lpm hs only  rec  Protonix is supposed to be Take 30- 60 min before your first and last meals of the day  For cough > mucinex or mucinex dm up to 1200 mg every 12 hours as needed and use the flutter valve as much as you can    NP rec 07/04/18  Mucinex twice a day with full glass of water Use flutter valve 3-4 times a day Albuterol nebulizer every 4-6 hours as needed for shortness of breath or wheezing Continue Trelegy 1 puff daily  CXR and labs today Please qualify for POC      07/11/2018  f/u ov/Staci Carver re:   COPD gold II/ maint trelegy -  Qualified for amb 02 and chronically on 2lpm hs but has not received portable system yet Chief Complaint  Patient presents with  . Follow-up    Breathing is some better since the  last visit. He  is using his albuterol inhaler and neb both several times per day.   Dyspnea:   able to do [employer] shopping easier than before, thinks trelegy is why even though not using 02 yet as rec  Cough: minimal mucoid in am's/ using flutter Sleeping: flat bed, on side/ 2 pillows  SABA use: as above 02: hs 2lpm, does not have portable system    No obvious day to day or daytime variability or assoc excess/ purulent sputum or mucus plugs or hemoptysis or cp or chest tightness, subjective wheeze or overt sinus or hb symptoms.   Sleeping as above  without nocturnal  or early am exacerbation  of respiratory  c/o's or need for noct saba. Also denies any obvious fluctuation of symptoms with weather or environmental changes or other aggravating or alleviating factors except as outlined above   No unusual exposure hx or h/o childhood pna/ asthma or knowledge of premature birth.  Current Allergies, Complete Past Medical History, Past Surgical History, Family History, and Social History were reviewed in [employer] record.  ROS  The following are not active complaints unless bolded Hoarseness, sore throat, dysphagia, dental problems, itching, sneezing,  nasal congestion or discharge of excess mucus or purulent secretions, ear ache,   fever, chills, sweats, unintended wt loss or wt gain, classically pleuritic or exertional cp,  orthopnea pnd or arm/hand swelling  or leg swelling, presyncope, palpitations, abdominal pain, anorexia, nausea, vomiting, diarrhea  or change in bowel habits or change in bladder habits, change in stools or change in urine, dysuria, hematuria,  rash, arthralgias, visual complaints, headache, numbness, weakness or ataxia or problems with walking or coordination,  change in mood or  memory.        Current Meds  Medication Sig  . albuterol (PROAIR HFA) 108 (90 BASE) MCG/ACT inhaler Inhale 1 puff into the lungs every 4 (four) hours as needed for wheezing or shortness of  breath.  Marland Kitchen albuterol (PROVENTIL) (2.5 MG/3ML) 0.083% nebulizer solution INHALE CONTENTS OF 1 VIAL IN NEBULIZER EVERY 6 HOURS AS NEEDED FOR WHEEZING OR SHORTNESS OF BREATH.  . clopidogrel (PLAVIX) 75 MG tablet Take 1 tablet (75 mg total) by mouth daily.  Marland Kitchen diltiazem (CARDIZEM) 30 MG tablet Take 1 tablet (30 mg total) by mouth 2 (two) times daily.  . fentaNYL (DURAGESIC - DOSED MCG/HR) 100 MCG/HR Apply one patch to skin every 3 days if tolerated  . fluticasone (FLONASE) 50 MCG/ACT nasal spray Place 2 sprays into both nostrils daily.  . Fluticasone-Umeclidin-Vilant (TRELEGY ELLIPTA) 100-62.5-25 MCG/INH AEPB Inhale 1 puff into the lungs daily.  Marland Kitchen gabapentin (NEURONTIN) 300 MG capsule Take 1 capsule by mouth 3 (three) times daily.  . metFORMIN (GLUCOPHAGE) 500 MG tablet Take 500 mg by mouth 2 (two) times daily with a meal.  . nitroGLYCERIN (NITROSTAT) 0.4 MG SL tablet DISSOLVE ONE TABLET UNDER TONGUE EVERY 5 MINUTES UP TO 3 DOSES AS NEEDED FOR CHEST PAIN  . oxyCODONE-acetaminophen (PERCOCET) 10-325 MG tablet Limit one tablet by mouth per day or 2-4 times per day for breakthrough pain while wearing fentanyl patch if tolerated  . OXYGEN 2 lpm with sleep  . pantoprazole (PROTONIX) 40 MG tablet Take 1 tablet (40 mg total) by mouth 2 (two) times daily.  . potassium chloride SA (K-DUR,KLOR-CON) 20 MEQ tablet Take 20 mEq by mouth daily.  . pravastatin (PRAVACHOL) 40 MG tablet Take 1 tablet by mouth daily.  Marland Kitchen Respiratory Therapy Supplies (FLUTTER) DEVI Use as  directed  . traZODone (DESYREL) 100 MG tablet TAKE ONE TABLET BY MOUTH AT BEDTIME AS NEEDED FOR SLEEP (Patient taking differently: TAKE ONE TABLET BY MOUTH AT BEDTIME FOR SLEEP)  . TRELEGY ELLIPTA 100-62.5-25 MCG/INH AEPB INHALE ONE PUFF INTO THE LUNGS DAILY.                       Objective:   Physical Exam   elderly wm/ somwhat rattly cough   07/11/2018       205 06/10/2018     214  05/13/2018     211 10/30/2016         205   09/11/16 210  lb 3.2 oz (95.3 kg)  07/31/16 206 lb 9.6 oz (93.7 kg)  03/19/16 215 lb (97.5 kg)       Vital signs reviewed - Note on arrival 02 sats  92% on RA           HEENT: Edentulous/ Modified Mallampati Score =3  O/w nl    oropharynx. Nl external ear canals without cough reflex -  Mild bilateral non-specific turbinate edema     NECK :  without JVD/Nodes/TM/ nl carotid upstrokes bilaterally   LUNGS: no acc muscle use,  Mild  barrel  contour chest wall with bilateral  Distant bs s audible wheeze and  without cough on insp or exp maneuver and mid/mod  Hyperresonant  to  percussion bilaterally     CV:  RRR  no s3 or murmur or increase in P2, and no edema   ABD:  Quite obese but soft and nontender with pos late insp Hoover's  in the supine position. No bruits or organomegaly appreciated, bowel sounds nl  MS:   Nl gait/  ext warm without deformities, calf tenderness, cyanosis or clubbing No obvious joint restrictions   SKIN: warm and dry without lesions    NEURO:  alert, approp, nl sensorium with  no motor or cerebellar deficits apparent.          I personally reviewed images and agree with radiology impression as follows:  CXR:  07/04/2018 COPD.  Bibasilar scarring.  No active disease.          Assessment & Plan:

## 2018-07-12 ENCOUNTER — Encounter: Payer: Self-pay | Admitting: Internal Medicine

## 2018-07-12 NOTE — Assessment & Plan Note (Signed)
Quit smoking 1992  - MMRC 2 at first pulmonary ov 01/24/15  - 01/24/2015  Walked RA x 3 laps @ 185 ft each stopped due to  End of study, slow pace, no sob or desat   - 01/24/2015 p extensive coaching HFA effectiveness =   75% > try symbicort 160 2bid > pt d/c'd on his own  - PFT's  02/07/2015  FEV1 1.44 (52 % ) ratio 52  p no % improvement from saba with DLCO  58 % corrects to 85 % for alv volume   - 09/11/2016  Try BREO - PFT's  10/30/2016  FEV1 1.63 (61 % ) ratio 56  p no % improvement from saba p duob around 4 h prior to study with DLCO  70/69 % corrects to 90 % for alv volume    - 10/30/2016 trial of Trelogy as using way too much saba at baseline > still overusing saba as of 05/13/2018  - 05/13/2018 added flutter  06/10/2018  Alpha one AT screening MM/ level 148     - 07/11/2018  After extensive coaching inhaler device,  effectiveness = 90% with elipta, 75% with hfa       Group D in terms of symptom/risk and laba/lama/ICS  therefore appropriate rx at this point.

## 2018-07-12 NOTE — Assessment & Plan Note (Addendum)
ono RA ordered  01/29/15  < 89% @ 31 m 36sec > rec 2lpm at hs  Added prn 02 with activity 07/31/16 with goal of keeping sats > 90% ONO 05/21/18 desat RA x 86 min > rec 05/30/18:  resume 2lpm - 06/10/2018   Walked RA x one lap = 210 ft - stopped due to  Backpain, sob s desat slow to avg pace  07/04/2018-   . Amb O2 sat 87% RA, needs 3-4L pulsed to keep O2 >>90%  Will check to see why portable 02 not being provided > does not need to be POC but note by the new guidelines in stable copd if it does not improve his ex tolerance/ mobility/ activity level he may not really need amb 02.    F/u will be q 3 m   I had an extended discussion with the patient reviewing all relevant studies completed to date and  lasting 15 to 20 minutes of a 25 minute visit    See device teaching which extended face to face time for this visit.  Each maintenance medication was reviewed in detail including emphasizing most importantly the difference between maintenance and prns and under what circumstances the prns are to be triggered using an action plan format that is not reflected in the computer generated alphabetically organized AVS which I have not found useful in most complex patients, especially with respiratory illnesses  Please see AVS for specific instructions unique to this visit that I personally wrote and verbalized to the the pt in detail and then reviewed with pt  by my nurse highlighting any  changes in therapy recommended at today's visit to their plan of care.

## 2018-09-27 ENCOUNTER — Other Ambulatory Visit: Payer: Self-pay | Admitting: Internal Medicine

## 2018-10-10 ENCOUNTER — Ambulatory Visit: Payer: Self-pay | Admitting: Internal Medicine

## 2018-11-09 ENCOUNTER — Other Ambulatory Visit: Payer: Self-pay

## 2018-11-09 ENCOUNTER — Ambulatory Visit (INDEPENDENT_AMBULATORY_CARE_PROVIDER_SITE_OTHER): Payer: Medicare Other | Admitting: Internal Medicine

## 2018-11-09 ENCOUNTER — Encounter: Payer: Self-pay | Admitting: Internal Medicine

## 2018-11-09 VITALS — BP 132/64 | HR 85 | Temp 97.8°F | Ht 67.0 in | Wt 213.0 lb

## 2018-11-09 DIAGNOSIS — J449 Chronic obstructive pulmonary disease, unspecified: Secondary | ICD-10-CM | POA: Diagnosis not present

## 2018-11-09 DIAGNOSIS — J9611 Chronic respiratory failure with hypoxia: Secondary | ICD-10-CM | POA: Diagnosis not present

## 2018-11-09 NOTE — Progress Notes (Addendum)
Subjective:   Patient ID: Herbert May, male    DOB: 05-Sep-1940,   MRN: [id number]    Brief patient profile:  49 yowm quit smoking in 1992 with breathing problems then that worsened over the years esp since 2011 lived in [place] and moved to [place] in  2013 on 02 since early 2016 at hs only with GOLD II criteria 11/09/16    History of Present Illness  01/24/2015 1st [facility] office visit/ Yocelyn Brocious  On flovent/ spiriva  Chief Complaint  Patient presents with  . Pulmonary Consult    Self referral. Pt states has had SOB "all of my life"- worse x 3 months. He states he is SOB all of the time, with or without exertion. He also c/o cough- prod with yellow sputum.    gradually worse doe x 4-5 years but can still do [employer] slowly but c/o sense of sob at rest assoc with congested cough and worse symptoms walking in hot humid conditions not much better with saba but doesn't really understand how to use them  rec Ok to use the combination of albuterol/ipatriprium up to 4 x daily  Only use your albuterol as a rescue medication pantoprazole 40  Mg Take 30- 60 min before your first and last meals of the day  GERD diet   07/31/16 NP  Pt presents for an acute office visit. Complains of persistent cough, congestion , dyspnea and wheezing . Worse for last 3-4 weeks .  Recently admitted 2 weeks ago at  [facility] for COPD flare , tx w/ abx and steroids . No discharge summary at todays visit. Request sent to hospital . Since got out of hospital he is not feeling much better. Still has cough/congestion and wheezing . Seen by PCP yesterday and started Doxycycline . Coughing up thick green mucus.  No chest pain or hemoptysis. Appetitie is down. No n/v/d.  Last seen in pulmonary clinic in 2016.  Previously on Symbicort and Spiriva in past but insurance did not cover .  On Duoneb Four times a day  . Insurance covers this well.  rec Finish Doxycycline .  Mucinex DM Twice daily  As needed   Cough/congestion  Continue on Duoneb Four times a day  .  Begin Oxygen 2l/m with activity  Order to DME for portable concentrator.  Begin BREO 1 puff daily .     10/30/2016  f/u ov/Dorathy Stallone re:  GOLD II copd / breo maint / overuse of saba noted / 02 2lpm  hs and prn with ambulation  Chief Complaint  Patient presents with  . Follow-up    PFT's done today. Breathing is unchanged. He is using proair 3-4 x per day and albuterol neb 4 x daily.   doe = MMRC2 usually s 43 rec Plan A = Automatic = try off the BREO  And replace it with Trelogy one click each am - take 2 long drags and hold 5 seconds each  Plan B = Backup Only use your albuterol (Proair)  as a rescue medication  Work on inhaler technique:    Plan C = Crisis - only use your albuterol/ipatropium nebulizer if you first try Plan B    ER in [place]  And Nov 2019 for copd / fast heart rate > last in  ER 05/04/18 rec pred x 5 day    05/13/2018 acute extended ov/Caress Reffitt re: re-establish for COPD GOLD II / sob/cough some better p last ER trip / now maint on Trelegy  Chief Complaint  Patient presents with  . Acute Visit    Pt c/o increased SOB and cough for the past few months. His cough is prod with green sputum.  He is SOB with minimal exertion and also when he lies down. He is using his proair and albuterol neb each 4 x daily on average.   doe =  MMRC3 = can't walk 100 yards even at a slow pace at a flat grade s stopping due to sob, still walks to mb most days stops 4-5 x on way back  Cough  First thing am / green and thick x up to 15 min to clear it  Sleep R side down 2 pillows/ bed flat  SABA: way over using at baseline  rec Plan A = Automatic =  Continue Trelogy one click each am - take 2 long drags and hold 5 seconds each  Plan B = Backup Only use your albuterol (Proair)   Work on inhaler technique  Plan C = Crisis - only use your albuterol nebulizer if you first try Plan B and it fails to help > ok to use the nebulizer up to  every 4 hours but if start needing it regularly call for immediate appointment Protonix (pantoprazole ) should  Be Take 30- 60 min before your first and last meals of the day  GERD diet   Augmentin 875 mg take one pill twice daily  X 10 days - take at breakfast and supper with large glass of water.  It would help reduce the usual side effects (diarrhea and yeast infections) if you ate cultured yogurt at lunch.  For cough > mucinex or mucinex dm up to 1200 mg every 12 hours as needed and use the flutter valve as much as you can  Depomedrol 120 mg IM today     04/30/18  ONO on RA was pos for desat (done by [facility] 05/21/18) Per MW- start 2lpm o2 with sleep   06/10/2018  f/u ov/Diamonds Lippard re: copd GOLD II/ cb features/ poor insight into how/ when to use hfa vs neb - did not bring neb med but did bring all others and miant on  trelegy  Chief Complaint  Patient presents with  . Follow-up    Breathing has improved some. He is using his albuterol inhaler and neb both 2 x daily.   Dyspnea:  MMRC 3  Cough: variable denies worse in am Sleeping: fine on side on 2lpm/ did not use bed blocks as rec  SABA use: as above, still misunderstanding how to use hfa vs neb but  02: 2lpm hs only  rec  Protonix is supposed to be Take 30- 60 min before your first and last meals of the day  For cough > mucinex or mucinex dm up to 1200 mg every 12 hours as needed and use the flutter valve as much as you can    NP rec 07/04/18  Mucinex twice a day with full glass of water Use flutter valve 3-4 times a day Albuterol nebulizer every 4-6 hours as needed for shortness of breath or wheezing Continue Trelegy 1 puff daily  CXR and labs today Please qualify for POC      07/11/2018  f/u ov/Malyah Ohlrich re:   COPD gold II/ maint trelegy -  Qualified for amb 02 and chronically on 2lpm hs but has not received portable system yet Chief Complaint  Patient presents with  . Follow-up    Breathing is some better since the  last visit. He  is using his albuterol inhaler and neb both several times per day.   Dyspnea:   able to do [employer] shopping easier than before, thinks trelegy is why even though not using 02 yet as rec  Cough: minimal mucoid in am's/ using flutter Sleeping: flat bed, on side/ 2 pillows  SABA use: as above 02: hs 2lpm, does not have portable system  rec We will see if we can troubleshoot why you don't have a portable system but only needs to be used with walking outside of house  No change in medications    11/09/2018  f/u ov/Madalina Rosman re:  Copd II/ maint trelegy/ 02 2lpm only Chief Complaint  Patient presents with  . Follow-up    Breathing is about the same. He is using his proair and neb with albuterol both 2-3 x per day.  Dyspnea:  Gets to shop at [place]/ gets dropped off at door / not using 02 - slow walk to mb and back hilly Cough: no Sleeping: on side / bed is flat , 2 pillows  SABA use: up to twice daily when "overdoes it"  02: 2lpm at hs /not using during the day    No obvious day to day or daytime variability or assoc excess/ purulent sputum or mucus plugs or hemoptysis or cp or chest tightness, subjective wheeze or overt sinus or hb symptoms.   Sleeping  without nocturnal  or early am exacerbation  of respiratory  c/o's or need for noct saba. Also denies any obvious fluctuation of symptoms with weather or environmental changes or other aggravating or alleviating factors except as outlined above   No unusual exposure hx or h/o childhood pna/ asthma or knowledge of premature birth.  Current Allergies, Complete Past Medical History, Past Surgical History, Family History, and Social History were reviewed in [employer] record.  ROS  The following are not active complaints unless bolded Hoarseness, sore throat, dysphagia, dental problems, itching, sneezing,  nasal congestion or discharge of excess mucus or purulent secretions, ear ache,   fever, chills, sweats, unintended wt loss  or wt gain, classically pleuritic or exertional cp,  orthopnea pnd or arm/hand swelling  or leg swelling, presyncope, palpitations, abdominal pain, anorexia, nausea, vomiting, diarrhea  or change in bowel habits or change in bladder habits, change in stools or change in urine, dysuria, hematuria,  rash, arthralgias, visual complaints, headache, numbness, weakness or ataxia or problems with walking or coordination,  change in mood or  memory.        Current Meds  Medication Sig  . albuterol (PROAIR HFA) 108 (90 BASE) MCG/ACT inhaler Inhale 1 puff into the lungs every 4 (four) hours as needed for wheezing or shortness of breath.  Marland Kitchen albuterol (PROVENTIL) (2.5 MG/3ML) 0.083% nebulizer solution INHALE CONTENTS OF 1 VIAL IN NEBULIZER EVERY 6 HOURS AS NEEDED FOR WHEEZING OR SHORTNESS OF BREATH.  . clopidogrel (PLAVIX) 75 MG tablet Take 1 tablet (75 mg total) by mouth daily.  Marland Kitchen diltiazem (CARDIZEM) 30 MG tablet Take 1 tablet (30 mg total) by mouth 2 (two) times daily.  . fentaNYL (DURAGESIC - DOSED MCG/HR) 100 MCG/HR Apply one patch to skin every 3 days if tolerated  . fluticasone (FLONASE) 50 MCG/ACT nasal spray Place 2 sprays into both nostrils daily.  Marland Kitchen gabapentin (NEURONTIN) 300 MG capsule Take 1 capsule by mouth 3 (three) times daily.  . metFORMIN (GLUCOPHAGE) 500 MG tablet Take 500 mg by mouth 2 (two) times daily  with a meal.  . nitroGLYCERIN (NITROSTAT) 0.4 MG SL tablet DISSOLVE ONE TABLET UNDER TONGUE EVERY 5 MINUTES UP TO 3 DOSES AS NEEDED FOR CHEST PAIN  . oxyCODONE-acetaminophen (PERCOCET) 10-325 MG tablet Limit one tablet by mouth per day or 2-4 times per day for breakthrough pain while wearing fentanyl patch if tolerated  . OXYGEN 2 lpm with sleep  . pantoprazole (PROTONIX) 40 MG tablet Take 1 tablet (40 mg total) by mouth 2 (two) times daily.  . potassium chloride SA (K-DUR,KLOR-CON) 20 MEQ tablet Take 20 mEq by mouth daily.  . pravastatin (PRAVACHOL) 40 MG tablet Take 1 tablet by mouth  daily.  Marland Kitchen Respiratory Therapy Supplies (FLUTTER) DEVI Use as directed  . traZODone (DESYREL) 100 MG tablet TAKE ONE TABLET BY MOUTH AT BEDTIME AS NEEDED FOR SLEEP (Patient taking differently: TAKE ONE TABLET BY MOUTH AT BEDTIME FOR SLEEP)  . TRELEGY ELLIPTA 100-62.5-25 MCG/INH AEPB INHALE ONE PUFF INTO THE LUNGS DAILY.                         Objective:   Physical Exam  Elderly wm / minimal rattling   11/09/2018       213  07/11/2018       205 06/10/2018     214  05/13/2018     211 10/30/2016         205   09/11/16 210 lb 3.2 oz (95.3 kg)  07/31/16 206 lb 9.6 oz (93.7 kg)  03/19/16 215 lb (97.5 kg)        Vital signs reviewed - Note on arrival 02 sats  95% on RA      Reports: edentulous HEENT:  Mask in place due to covid 19 restrictrictions  NECK :  without JVD/Nodes/TM/ nl carotid upstrokes bilaterally   LUNGS: no acc muscle use,  Mod barrel  contour chest wall with bilateral  Distant bs s audible wheeze and  without cough on insp or exp maneuver and mod  Hyperresonant  to  percussion bilaterally     CV:  RRR  no s3 or murmur or increase in P2, and no edema   ABD:  soft and nontender with pos mid insp Hoover's  in the supine position. No bruits or organomegaly appreciated, bowel sounds nl  MS:   Nl gait/  ext warm without deformities, calf tenderness, cyanosis or clubbing No obvious joint restrictions   SKIN: warm and dry without lesions    NEURO:  alert, approp, nl sensorium with  no motor or cerebellar deficits apparent.                          Assessment & Plan:

## 2018-11-09 NOTE — Patient Instructions (Signed)
No change in recommendations   Please schedule a follow up visit in 6  months but call sooner if needed    

## 2018-11-13 ENCOUNTER — Encounter: Payer: Self-pay | Admitting: Internal Medicine

## 2018-11-13 NOTE — Assessment & Plan Note (Addendum)
ono RA ordered  01/29/15  < 89% @ 26 m 36sec > rec 2lpm at hs  Added prn 02 with activity 07/31/16 with goal of keeping sats > 90% ONO 05/21/18 desat RA x 86 min > rec 05/30/18:  resume 2lpm - 06/10/2018   Walked RA x one lap = 210 ft - stopped due to  Backpain, sob s desat slow to avg pace  07/04/2018-   Amb O2 sat 87% RA, needs 3-4L pulsed to keep O2 >>90% - 11/09/2018   Walked RA x one lap =  approx 250 ft - slow pace/ stopped due to  Sob with sats fine and really not sob so much as back/ leg pains  Probably would desaturate at higher workloads, advised to monitor with goal of > 90% with activity but for now no 02 needed with ambulation   I had an extended discussion with the patient   reviewing all relevant studies completed to date and  lasting 15 to 20 minutes of a 25 minute visit  which included directly observing ambulatory 02 saturation study documented in a/p section of  today's  office note.  See device teaching which also extended face to face time for this visit   Each maintenance medication was reviewed in detail including most importantly the difference between maintenance and prns and under what circumstances the prns are to be triggered using an action plan format that is not reflected in the computer generated alphabetically organized AVS.     Please see AVS for specific instructions unique to this visit that I personally wrote and verbalized to the the pt in detail and then reviewed with pt  by my nurse highlighting any changes in therapy recommended at today's visit .

## 2018-11-13 NOTE — Assessment & Plan Note (Signed)
Quit smoking 1992  - MMRC 2 at first pulmonary ov 01/24/15  - 01/24/2015  Walked RA x 3 laps @ 185 ft each stopped due to  End of study, slow pace, no sob or desat   - 01/24/2015 p extensive coaching HFA effectiveness =   75% > try symbicort 160 2bid > pt d/c'd on his own  - PFT's  02/07/2015  FEV1 1.44 (52 % ) ratio 52  p no % improvement from saba with DLCO  58 % corrects to 85 % for alv volume   - 09/11/2016  Try BREO - PFT's  10/30/2016  FEV1 1.63 (61 % ) ratio 56  p no % improvement from saba p duob around 4 h prior to study with DLCO  70/69 % corrects to 90 % for alv volume    - 10/30/2016 trial of Trelogy as using way too much saba at baseline > still overusing saba as of 05/13/2018  - 05/13/2018 added flutter  06/10/2018  Alpha one AT screening MM/ level 148  06/10/2018  After extensive coaching inhaler device,  effectiveness =  90% elipta 07/04/2018-    Amb O2 sat 87% RA, needs 3-4L pulsed to keep O2 >>90% - 07/11/2018  After extensive coaching inhaler device,  effectiveness = 90% with elipta  - 11/09/2018  After extensive coaching inhaler device,  effectiveness =    90%     Group D in terms of symptom/risk and laba/lama/ICS  therefore appropriate rx at this point >>>  Continue trelegy ? approp use of saba   I spent extra time with pt today reviewing appropriate use of albuterol for prn use on exertion with the following points: 1) saba is for relief of sob that does not improve by walking a slower pace or resting but rather if the pt does not improve after trying this first. 2) If the pt is convinced, as many are, that saba helps recover from activity faster then it's easy to tell if this is the case by re-challenging : ie stop, take the inhaler, then p 5 minutes try the exact same activity (intensity of workload) that just caused the symptoms and see if they are substantially diminished or not after saba 3) if there is an activity that reproducibly causes the symptoms, try the saba 15 min before  the activity on alternate days   If in fact the saba really does help, then fine to continue to use it prn but advised may need to look closer at the maintenance regimen being used to achieve better control of airways disease with exertion.

## 2018-12-12 ENCOUNTER — Ambulatory Visit: Payer: Self-pay | Admitting: Internal Medicine

## 2019-01-02 ENCOUNTER — Other Ambulatory Visit: Payer: Self-pay | Admitting: Internal Medicine

## 2019-01-02 ENCOUNTER — Ambulatory Visit: Payer: Self-pay | Admitting: Internal Medicine

## 2019-02-20 ENCOUNTER — Other Ambulatory Visit: Payer: Self-pay | Admitting: Adult Health

## 2019-02-20 ENCOUNTER — Telehealth: Payer: Self-pay | Admitting: Cardiovascular Disease

## 2019-02-20 NOTE — Telephone Encounter (Signed)
Virtual Visit Pre-Appointment Phone Call  "(Name), I am calling you today to discuss your upcoming appointment. We are currently trying to limit exposure to the virus that causes COVID-19 by seeing patients at home rather than in the office."  1. "What is the BEST phone number to call the day of the visit?" - include this in appointment notes  2. Do you have or have access to (through a family member/friend) a smartphone with video capability that we can use for your visit?" a. If yes - list this number in appt notes as cell (if different from BEST phone #) and list the appointment type as a VIDEO visit in appointment notes b. If no - list the appointment type as a PHONE visit in appointment notes  3. Confirm consent - "In the setting of the current Covid19 crisis, you are scheduled for a (phone or video) visit with your provider on (date) at (time).  Just as we do with many in-office visits, in order for you to participate in this visit, we must obtain consent.  If you'd like, I can send this to your mychart (if signed up) or email for you to review.  Otherwise, I can obtain your verbal consent now.  All virtual visits are billed to your insurance company just like a normal visit would be.  By agreeing to a virtual visit, we'd like you to understand that the technology does not allow for your provider to perform an examination, and thus may limit your provider's ability to fully assess your condition. If your provider identifies any concerns that need to be evaluated in person, we will make arrangements to do so.  Finally, though the technology is pretty good, we cannot assure that it will always work on either your or our end, and in the setting of a video visit, we may have to convert it to a phone-only visit.  In either situation, we cannot ensure that we have a secure connection.  Are you willing to proceed?" STAFF: Did the patient verbally acknowledge consent to telehealth visit? Document  YES/NO here: yes  4. Advise patient to be prepared - "Two hours prior to your appointment, go ahead and check your blood pressure, pulse, oxygen saturation, and your weight (if you have the equipment to check those) and write them all down. When your visit starts, your provider will ask you for this information. If you have an Apple Watch or Kardia device, please plan to have heart rate information ready on the day of your appointment. Please have a pen and paper handy nearby the day of the visit as well."  5. Give patient instructions for MyChart download to smartphone OR [email or web address] as below if video visit (depending on what platform provider is using)  6. Inform patient they will receive a phone call 15 minutes prior to their appointment time (may be from unknown caller ID) so they should be prepared to answer    TELEPHONE CALL NOTE  Herbert May has been deemed a candidate for a follow-up tele-health visit to limit community exposure during the Covid-19 pandemic. I spoke with the patient via phone to ensure availability of phone/video source, confirm preferred email & phone number, and discuss instructions and expectations.  I reminded Herbert May to be prepared with any vital sign and/or heart rhythm information that could potentially be obtained via home monitoring, at the time of his visit. I reminded Herbert May to expect a phone call prior to his visit.  Herbert May 02/20/2019 1:45 PM   INSTRUCTIONS FOR DOWNLOADING THE MYCHART APP TO SMARTPHONE  - The patient must first make sure to have activated MyChart and know their login information - If Apple, go to [employer] and type in MyChart in the search bar and download the app. If Android, ask patient to go to [employer] and type in [place] in the search bar and download the app. The app is free but as with any other app downloads, their phone may require them to verify saved payment information or  Apple/Android password.  - The patient will need to then log into the app with their MyChart username and password, and select [facility] as their healthcare provider to link the account. When it is time for your visit, go to the MyChart app, find appointments, and click Begin Video Visit. Be sure to Select Allow for your device to access the Microphone and Camera for your visit. You will then be connected, and your provider will be with you shortly.  **If they have any issues connecting, or need assistance please contact MyChart service desk [phone])**  **If using a computer, in order to ensure the best quality for their visit they will need to use either of the following Internet Browsers: [employer], or Google Chrome**  IF USING DOXIMITY or DOXY.ME - The patient will receive a link just prior to their visit by text.     FULL LENGTH CONSENT FOR TELE-HEALTH VISIT   I hereby voluntarily request, consent and authorize [facility] and its employed or contracted physicians, physician assistants, nurse practitioners or other licensed health care professionals (the Practitioner), to provide me with telemedicine health care services (the Services") as deemed necessary by the treating Practitioner. I acknowledge and consent to receive the Services by the Practitioner via telemedicine. I understand that the telemedicine visit will involve communicating with the Practitioner through live audiovisual communication technology and the disclosure of certain medical information by electronic transmission. I acknowledge that I have been given the opportunity to request an in-person assessment or other available alternative prior to the telemedicine visit and am voluntarily participating in the telemedicine visit.  I understand that I have the right to withhold or withdraw my consent to the use of telemedicine in the course of my care at any time, without affecting my right to future care  or treatment, and that the Practitioner or I may terminate the telemedicine visit at any time. I understand that I have the right to inspect all information obtained and/or recorded in the course of the telemedicine visit and may receive copies of available information for a reasonable fee.  I understand that some of the potential risks of receiving the Services via telemedicine include:   Delay or interruption in medical evaluation due to technological equipment failure or disruption;  Information transmitted may not be sufficient (e.g. poor resolution of images) to allow for appropriate medical decision making by the Practitioner; and/or   In rare instances, security protocols could fail, causing a breach of personal health information.  Furthermore, I acknowledge that it is my responsibility to provide information about my medical history, conditions and care that is complete and accurate to the best of my ability. I acknowledge that Practitioner's advice, recommendations, and/or decision may be based on factors not within their control, such as incomplete or inaccurate data provided by me or distortions of diagnostic images or specimens that may result from electronic transmissions. I understand that the  practice of medicine is not an Chief Strategy Officer and that Practitioner makes no warranties or guarantees regarding treatment outcomes. I acknowledge that I will receive a copy of this consent concurrently upon execution via email to the email address I last provided but may also request a printed copy by calling the office of [facility].    I understand that my insurance will be billed for this visit.   I have read or had this consent read to me.  I understand the contents of this consent, which adequately explains the benefits and risks of the Services being provided via telemedicine.   I have been provided ample opportunity to ask questions regarding this consent and the Services and have had  my questions answered to my satisfaction.  I give my informed consent for the services to be provided through the use of telemedicine in my medical care  By participating in this telemedicine visit I agree to the above.

## 2019-02-23 ENCOUNTER — Ambulatory Visit: Payer: Medicare Other | Admitting: Cardiovascular Disease

## 2019-03-02 ENCOUNTER — Other Ambulatory Visit: Payer: Self-pay | Admitting: Cardiovascular Disease

## 2019-04-12 ENCOUNTER — Encounter: Payer: Self-pay | Admitting: *Deleted

## 2019-04-12 ENCOUNTER — Encounter: Payer: Self-pay | Admitting: Cardiovascular Disease

## 2019-04-12 ENCOUNTER — Telehealth (INDEPENDENT_AMBULATORY_CARE_PROVIDER_SITE_OTHER): Payer: Medicare Other | Admitting: Cardiovascular Disease

## 2019-04-12 VITALS — Ht 67.0 in | Wt 209.0 lb

## 2019-04-12 DIAGNOSIS — I25118 Atherosclerotic heart disease of native coronary artery with other forms of angina pectoris: Secondary | ICD-10-CM

## 2019-04-12 DIAGNOSIS — E785 Hyperlipidemia, unspecified: Secondary | ICD-10-CM

## 2019-04-12 DIAGNOSIS — I358 Other nonrheumatic aortic valve disorders: Secondary | ICD-10-CM

## 2019-04-12 DIAGNOSIS — I10 Essential (primary) hypertension: Secondary | ICD-10-CM

## 2019-04-12 DIAGNOSIS — Z955 Presence of coronary angioplasty implant and graft: Secondary | ICD-10-CM

## 2019-04-12 NOTE — Patient Instructions (Addendum)
Your physician wants you to follow-up in: 1 YEAR WITH DR KONESWARAN You will receive a reminder letter in the mail two months in advance. If you don't receive a letter, please call our office to schedule the follow-up appointment.  Your physician recommends that you continue on your current medications as directed. Please refer to the Current Medication list given to you today.  Thank you for choosing [facility]!!    

## 2019-04-12 NOTE — Progress Notes (Signed)
Virtual Visit via Telephone Note   This visit type was conducted due to national recommendations for restrictions regarding the COVID-19 Pandemic (e.g. social distancing) in an effort to limit this patient's exposure and mitigate transmission in our community.  Due to his co-morbid illnesses, this patient is at least at moderate risk for complications without adequate follow up.  This format is felt to be most appropriate for this patient at this time.  The patient did not have access to video technology/had technical difficulties with video requiring transitioning to audio format only (telephone).  All issues noted in this document were discussed and addressed.  No physical exam could be performed with this format.  Please refer to the patient's chart for his  consent to telehealth for [facility].   Date:  04/12/2019   ID:  Herbert May, DOB Jul 21, 1940, MRN [id number]  Patient Location: Home Provider Location: Office  PCP:  Rosita Fire, MD  Cardiologist:  Kate Sable, MD  Electrophysiologist:  None   Evaluation Performed:  Follow-Up Visit  Chief Complaint:  CAD  History of Present Illness:    Herbert May is a 78 y.o. male with coronary artery disease.  The patient denies any symptoms of chest pain, palpitations, shortness of breath, lightheadedness, dizziness, leg swelling, orthopnea, PND, and syncope.  The patient does not have symptoms concerning for COVID-19 infection (fever, chills, cough, or new shortness of breath).    Past Medical History:  Diagnosis Date  . Allergy   . Arthritis   . Asthma   . CAD (coronary artery disease) of artery bypass graft 12/27/2012  . Colostomy status ([place])   . COPD (chronic obstructive pulmonary disease) ([place])   . Diabetes mellitus without complication ([place])   . Emphysema of lung ([place])   . GERD (gastroesophageal reflux disease)   . Heart attack ([place])   . Hyperlipidemia   . Hypertension   . Oxygen deficiency    Past  Surgical History:  Procedure Laterality Date  . BACK SURGERY    . cardiac stents    . COLOSTOMY    . COLOSTOMY CLOSURE    . HERNIA REPAIR    . KNEE SURGERY    . NOSE SURGERY       Current Meds  Medication Sig  . albuterol (PROAIR HFA) 108 (90 BASE) MCG/ACT inhaler Inhale 1 puff into the lungs every 4 (four) hours as needed for wheezing or shortness of breath.  Marland Kitchen albuterol (PROVENTIL) (2.5 MG/3ML) 0.083% nebulizer solution INHALE CONTENTS OF 1 VIAL IN NEBULIZER EVERY 6 HOURS AS NEEDED FOR WHEEZING OR SHORTNESS OF BREATH.  . clopidogrel (PLAVIX) 75 MG tablet Take 1 tablet (75 mg total) by mouth daily.  Marland Kitchen diltiazem (CARDIZEM) 30 MG tablet Take 1 tablet (30 mg total) by mouth 2 (two) times daily.  . fentaNYL (DURAGESIC - DOSED MCG/HR) 100 MCG/HR Apply one patch to skin every 3 days if tolerated  . fluticasone (FLONASE) 50 MCG/ACT nasal spray Place 2 sprays into both nostrils daily.  Marland Kitchen gabapentin (NEURONTIN) 300 MG capsule Take 1 capsule by mouth 3 (three) times daily.  . metFORMIN (GLUCOPHAGE) 500 MG tablet Take 500 mg by mouth 2 (two) times daily with a meal.  . nitroGLYCERIN (NITROSTAT) 0.4 MG SL tablet DISSOLVE ONE TABLET UNDER TONGUE EVERY 5 MINUTES UP TO 3 DOSES AS NEEDED FOR CHEST PAIN  . oxyCODONE-acetaminophen (PERCOCET) 10-325 MG tablet Limit one tablet by mouth per day or 2-4 times per day for breakthrough pain while wearing fentanyl patch  if tolerated  . OXYGEN 2 lpm with sleep  . pantoprazole (PROTONIX) 40 MG tablet Take 1 tablet (40 mg total) by mouth 2 (two) times daily.  . potassium chloride SA (K-DUR,KLOR-CON) 20 MEQ tablet Take 20 mEq by mouth daily.  . pravastatin (PRAVACHOL) 40 MG tablet Take 1 tablet by mouth daily.  Marland Kitchen Respiratory Therapy Supplies (FLUTTER) DEVI Use as directed  . traZODone (DESYREL) 100 MG tablet TAKE ONE TABLET BY MOUTH AT BEDTIME AS NEEDED FOR SLEEP (Patient taking differently: TAKE ONE TABLET BY MOUTH AT BEDTIME FOR SLEEP)  . TRELEGY ELLIPTA  100-62.5-25 MCG/INH AEPB INHALE ONE PUFF INTO THE LUNGS DAILY.     Allergies:   Aspirin, Fish allergy, Latex, Onion, Other, Levaquin [levofloxacin in d5w], and Neosporin [neomycin-bacitracin zn-polymyx]   Social History   Tobacco Use  . Smoking status: Former Smoker    Packs/day: 1.00    Years: 37.00    Pack years: 37.00    Types: Cigarettes    Start date: 12/30/1953    Quit date: 12/28/1990    Years since quitting: 28.3  . Smokeless tobacco: Former Systems developer    Quit date: 06/30/1983  Substance Use Topics  . Alcohol use: Yes    Alcohol/week: 0.0 standard drinks    Comment: occasional  . Drug use: No     Family Hx: The patient's family history includes COPD in his mother; Cancer in his mother, sister, and sister; Diabetes in his mother.  ROS:   Please see the history of present illness.     All other systems reviewed and are negative.   Prior CV studies:   The following studies were reviewed today:  Nuclear stress test 09/09/15 demonstrated a non-reversible defect consistent with artifact. There was no reported ischemia. LVEF 62%. However, it was deemed an intermediate risk study.  Echocardiogram 09/04/15 demonstrated normal left ventricular systolic function, EF 0000000, indeterminate diastolic function and aortic valve sclerosis without stenosis.  Labs/Other Tests and Data Reviewed:    EKG:  No ECG reviewed.  Recent Labs: 05/13/2018: TSH 3.05 07/04/2018: BUN 16; Creatinine, Ser 1.22; Hemoglobin 15.2; Platelets 382.0; Potassium 4.9; Pro B Natriuretic peptide (BNP) 15.0; Sodium 132   Recent Lipid Panel Lab Results  Component Value Date/Time   CHOL 160 12/12/2012 11:13 AM   TRIG 169 (H) 12/12/2012 11:13 AM   HDL 50 12/12/2012 11:13 AM   LDLCALC 76 12/12/2012 11:13 AM    Wt Readings from Last 3 Encounters:  04/12/19 209 lb (94.8 kg)  11/09/18 213 lb (96.6 kg)  07/11/18 205 lb (93 kg)     Objective:    Vital Signs:  Ht 5\' 7"  (1.702 m)   Wt 209 lb (94.8 kg)   BMI 32.73  kg/m    VITAL SIGNS:  reviewed  ASSESSMENT & PLAN:    1. 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 His most recent cardiac catheterization from 02/16/2007 showed wide patency of the mid-LAD stent and at that time, he underwent successful stenting of the proximal LAD. There was insignificant disease in the LCx and RCA. He is allergic to ASA and reportedly intolerant to beta blockers.  I will continue Plavix 75 mg daily. Continue pravastatin. Stress test without ischemia 09/09/15.  2. Aortic sclerosis: Echo reviewed above. Sclerosis w/o stenosis.  3. Essential HTN: No changes.  4. Dyslipidemia: Continue pravastatin.  I will obtain a copy of lipids from PCP.   COVID-19 Education: The signs and symptoms of COVID-19 were discussed with the patient and  how to seek care for testing (follow up with PCP or arrange E-visit).  The importance of social distancing was discussed today.  Time:   Today, I have spent 5 minutes with the patient with telehealth technology discussing the above problems.     Medication Adjustments/Labs and Tests Ordered: Current medicines are reviewed at length with the patient today.  Concerns regarding medicines are outlined above.   Tests Ordered: No orders of the defined types were placed in this encounter.   Medication Changes: No orders of the defined types were placed in this encounter.   Follow Up:  Either In Person or Virtual Visit in 1 year(s)  Signed, Kate Sable, MD  04/12/2019 10:27 AM    [place]

## 2019-04-17 ENCOUNTER — Telehealth: Payer: Self-pay | Admitting: Internal Medicine

## 2019-04-17 NOTE — Telephone Encounter (Signed)
Left message for either patient or his wife to call back.  

## 2019-04-19 NOTE — Telephone Encounter (Signed)
LMTCB x2 for pt. Due to the pt's symptoms it would probably be best if he saw his PCP.

## 2019-04-20 NOTE — Telephone Encounter (Signed)
Called and spoke to patient.  Patient stated that he was hospitalized at [facility] on 04/17/2019 and is positive for COVID. Patient reported that he is feeling better and is following quarantine. Nothing further  needed from [place].   Routing to Dr. Melvyn Novas as Juluis Rainier.

## 2019-04-27 ENCOUNTER — Telehealth: Payer: Self-pay | Admitting: Internal Medicine

## 2019-04-27 NOTE — Telephone Encounter (Signed)
Received call from pt and pt's wife, Joycelyn Schmid. Pt was audibly dyspneic, could only speak 2-3 words before taking breaths. Pt states the SOB and weakness has been going on for 5 days. Pt denied CP/tightness, hemoptysis, cough, swelling, f/c/s. Pt was at 88% on 2lpm (baseline liter flow). Advised them to increase to 3lpm and for pt to rest. Erik Obey to call 911 or go to nearest ED for eval.   Will forward to MW as Juluis Rainier.

## 2019-05-08 ENCOUNTER — Ambulatory Visit: Payer: Medicare Other | Admitting: Internal Medicine

## 2019-05-12 ENCOUNTER — Ambulatory Visit: Payer: Medicare Other | Admitting: Internal Medicine

## 2019-05-15 ENCOUNTER — Ambulatory Visit: Payer: Medicare Other | Admitting: Internal Medicine

## 2019-05-22 ENCOUNTER — Ambulatory Visit (INDEPENDENT_AMBULATORY_CARE_PROVIDER_SITE_OTHER): Payer: Medicare Other | Admitting: Internal Medicine

## 2019-05-22 ENCOUNTER — Encounter: Payer: Self-pay | Admitting: Internal Medicine

## 2019-05-22 DIAGNOSIS — J449 Chronic obstructive pulmonary disease, unspecified: Secondary | ICD-10-CM | POA: Diagnosis not present

## 2019-05-22 DIAGNOSIS — J9611 Chronic respiratory failure with hypoxia: Secondary | ICD-10-CM

## 2019-05-22 NOTE — Assessment & Plan Note (Signed)
Quit smoking 1992  - MMRC 2 at first pulmonary ov 01/24/15  - 01/24/2015  Walked RA x 3 laps @ 185 ft each stopped due to  End of study, slow pace, no sob or desat   - 01/24/2015 p extensive coaching HFA effectiveness =   75% > try symbicort 160 2bid > pt d/c'd on his own  - PFT's  02/07/2015  FEV1 1.44 (52 % ) ratio 52  p no % improvement from saba with DLCO  58 % corrects to 85 % for alv volume   - 09/11/2016  Try BREO - PFT's  10/30/2016  FEV1 1.63 (61 % ) ratio 56  p no % improvement from saba p duob around 4 h prior to study with DLCO  70/69 % corrects to 90 % for alv volume    - 10/30/2016 trial of Trelogy as using way too much saba at baseline > still overusing saba as of 05/13/2018  - 05/13/2018 added flutter  06/10/2018  Alpha one AT screening MM/ level 148  07/04/2018-    Amb O2 sat 87% RA, needs 3-4L pulsed to keep O2 >>90% - 11/09/2018  After extensive coaching inhaler device,  effectiveness =    90%  elipta   Group D in terms of symptom/risk and laba/lama/ICS  therefore appropriate rx at this point >>>  Trelegy, needs to reduce dep on saba/sama if possible and really doesn't need sama at all as competing for the same M3 receptor as LAMA in trelegy.  Advised: Try albuterol 15 min before an activity that you know would make you short of breath and see if it makes any difference and if makes none then don't take it after activity unless you can't catch your breath.  >>> f/u in 2 weeks with all meds in hand using a trust but verify approach to confirm accurate Medication  Reconciliation The principal here is that until we are certain that the  patients are doing what we've asked, it makes no sense to ask them to do more.

## 2019-05-22 NOTE — Patient Instructions (Addendum)
Goal is to keep your 02 level about 90% on whatever amount of oxygen is needed   If you can't  catch your breath at rest, then use the nebulizer up to every 4 hours  As needed    Please schedule a follow up office visit in 2 weeks, sooner if needed  with all medications /inhalers/ solutions in hand so we can verify exactly what you are taking. This includes all medications from all doctors and over the counters - need record at discharge from [facility]

## 2019-05-22 NOTE — Progress Notes (Signed)
Subjective:   Patient ID: Herbert May, male    DOB: [birth year],   MRN: [id number]    Brief patient profile:  60 yowm MM/ quit smoking in 1992 with breathing problems then that worsened over the years esp since 2011 lived in [place] and moved to [place] in  2013 on 02 since early 2016 at hs only with GOLD II criteria 11/09/16    History of Present Illness  01/24/2015 1st [facility] office visit/ Herbert May  On flovent/ spiriva  Chief Complaint  Patient presents with  . Pulmonary Consult    Self referral. Pt states has had SOB "all of my life"- worse x 3 months. He states he is SOB all of the time, with or without exertion. He also c/o cough- prod with yellow sputum.    gradually worse doe x 4-5 years but can still do [employer] slowly but c/o sense of sob at rest assoc with congested cough and worse symptoms walking in hot humid conditions not much better with saba but doesn't really understand how to use them  rec Ok to use the combination of albuterol/ipatriprium up to 4 x daily  Only use your albuterol as a rescue medication pantoprazole 40  Mg Take 30- 60 min before your first and last meals of the day  GERD diet   07/31/16 NP  Pt presents for an acute office visit. Complains of persistent cough, congestion , dyspnea and wheezing . Worse for last 3-4 weeks .  Recently admitted 2 weeks ago at  [facility] for COPD flare , tx w/ abx and steroids . No discharge summary at todays visit. Request sent to hospital . Since got out of hospital he is not feeling much better. Still has cough/congestion and wheezing . Seen by PCP yesterday and started Doxycycline . Coughing up thick green mucus.  No chest pain or hemoptysis. Appetitie is down. No n/v/d.  Last seen in pulmonary clinic in 2016.  Previously on Symbicort and Spiriva in past but insurance did not cover .  On Duoneb Four times a day  . Insurance covers this well.  rec Finish Doxycycline .  Mucinex DM Twice daily  As needed   Cough/congestion  Continue on Duoneb Four times a day  .  Begin Oxygen 2l/m with activity  Order to DME for portable concentrator.  Begin BREO 1 puff daily .     10/30/2016  f/u ov/Herbert May re:  GOLD II copd / breo maint / overuse of saba noted / 02 2lpm  hs and prn with ambulation  Chief Complaint  Patient presents with  . Follow-up    PFT's done today. Breathing is unchanged. He is using proair 3-4 x per day and albuterol neb 4 x daily.   doe = MMRC2 usually s 54 rec Plan A = Automatic = try off the BREO  And replace it with Trelogy one click each am - take 2 long drags and hold 5 seconds each  Plan B = Backup Only use your albuterol (Proair)  as a rescue medication  Work on inhaler technique:    Plan C = Crisis - only use your albuterol/ipatropium nebulizer if you first try Plan B    ER in [place]  And Nov 2019 for copd / fast heart rate > last in  ER 05/04/18 rec pred x 5 day    05/13/2018 acute extended ov/Herbert May re: re-establish for COPD GOLD II / sob/cough some better p last ER trip / now maint on  Trelegy  Chief Complaint  Patient presents with  . Acute Visit    Pt c/o increased SOB and cough for the past few months. His cough is prod with green sputum.  He is SOB with minimal exertion and also when he lies down. He is using his proair and albuterol neb each 4 x daily on average.   doe =  MMRC3 = can't walk 100 yards even at a slow pace at a flat grade s stopping due to sob, still walks to mb most days stops 4-5 x on way back  Cough  First thing am / green and thick x up to 15 min to clear it  Sleep R side down 2 pillows/ bed flat  SABA: way over using at baseline  rec Plan A = Automatic =  Continue Trelogy one click each am - take 2 long drags and hold 5 seconds each  Plan B = Backup Only use your albuterol (Proair)   Work on inhaler technique  Plan C = Crisis - only use your albuterol nebulizer if you first try Plan B and it fails to help > ok to use the nebulizer up to  every 4 hours but if start needing it regularly call for immediate appointment Protonix (pantoprazole ) should  Be Take 30- 60 min before your first and last meals of the day  GERD diet   Augmentin 875 mg take one pill twice daily  X 10 days - take at breakfast and supper with large glass of water.  It would help reduce the usual side effects (diarrhea and yeast infections) if you ate cultured yogurt at lunch.  For cough > mucinex or mucinex dm up to 1200 mg every 12 hours as needed and use the flutter valve as much as you can  Depomedrol 120 mg IM today     04/30/18  ONO on RA was pos for desat (done by [facility] 05/21/18) Per MW- start 2lpm o2 with sleep   06/10/2018  f/u ov/Herbert May re: copd GOLD II/ cb features/ poor insight into how/ when to use hfa vs neb - did not bring neb med but did bring all others and miant on  trelegy  Chief Complaint  Patient presents with  . Follow-up    Breathing has improved some. He is using his albuterol inhaler and neb both 2 x daily.   Dyspnea:  MMRC 3  Cough: variable denies worse in am Sleeping: fine on side on 2lpm/ did not use bed blocks as rec  SABA use: as above, still misunderstanding how to use hfa vs neb but  02: 2lpm hs only  rec  Protonix is supposed to be Take 30- 60 min before your first and last meals of the day  For cough > mucinex or mucinex dm up to 1200 mg every 12 hours as needed and use the flutter valve as much as you can    NP rec 07/04/18  Mucinex twice a day with full glass of water Use flutter valve 3-4 times a day Albuterol nebulizer every 4-6 hours as needed for shortness of breath or wheezing Continue Trelegy 1 puff daily  CXR and labs today Please qualify for POC      07/11/2018  f/u ov/Herbert May re:   COPD gold II/ maint trelegy -  Qualified for amb 02 and chronically on 2lpm hs but has not received portable system yet Chief Complaint  Patient presents with  . Follow-up    Breathing is some better  since the last visit. He  is using his albuterol inhaler and neb both several times per day.   Dyspnea:   able to do [employer] shopping easier than before, thinks trelegy is why even though not using 02 yet as rec  Cough: minimal mucoid in am's/ using flutter Sleeping: flat bed, on side/ 2 pillows  SABA use: as above 02: hs 2lpm, does not have portable system  rec We will see if we can troubleshoot why you don't have a portable system but only needs to be used with walking outside of house  No change in medications    11/09/2018  f/u ov/Shaquill Iseman re:  Copd II/ maint trelegy/ 02 2lpm only hs  Chief Complaint  Patient presents with  . Follow-up    Breathing is about the same. He is using his proair and neb with albuterol both 2-3 x per day.  Dyspnea:  Gets to shop at [employer] gets dropped off at door / not using 02 - slow walk to mb and back hilly Cough: no Sleeping: on side / bed is flat , 2 pillows  SABA use: up to twice daily when "overdoes it"  02: 2lpm at hs /not using during the day  rec No change rx   Virtual Visit via Telephone Note 05/22/2019   I connected with Herbert May on 05/22/19 at  2:15 PM EST by telephone and verified that I am speaking with the correct person using two identifiers.   I discussed the limitations, risks, security and privacy concerns of performing an evaluation and management service by telephone and the availability of in person appointments. I also discussed with the patient that there may be a patient responsible charge related to this service. The patient expressed understanding and agreed to proceed.   History of Present Illness: Back to baseline s/p admit at [facility] p covid 19 d/ c  "a week or two back"  Dyspnea:  Around the house / slow pace Cough: minimal mucoid  Sleeping: well lying flat in bed 2 pillows  SABA use: nebulizer '= three times a day duoneb 02:  2lpm 24/7 does not titrate   No obvious day to day or daytime variability or assoc excess/ purulent  sputum or mucus plugs or hemoptysis or cp or chest tightness, subjective wheeze or overt sinus or hb symptoms.    Also denies any obvious fluctuation of symptoms with weather or environmental changes or other aggravating or alleviating factors except as outlined above.   Meds reviewed/ med reconciliation completed        Observations/Objective: Talking in phrases rather than full sentences    Assessment and Plan: See problem list for active a/p's   Follow Up Instructions: See avs for instructions unique to this ov which includes revised/ updated med list     I discussed the assessment and treatment plan with the patient. The patient was provided an opportunity to ask questions and all were answered. The patient agreed with the plan and demonstrated an understanding of the instructions.   The patient was advised to call back or seek an in-person evaluation if the symptoms worsen or if the condition fails to improve as anticipated.  I provided 25 minutes of non-face-to-face time during this encounter.   Christinia Gully, MD

## 2019-05-22 NOTE — Assessment & Plan Note (Signed)
ono RA ordered  01/29/15  < 89% @ 82 m 36sec > rec 2lpm at hs  Added prn 02 with activity 07/31/16 with goal of keeping sats > 90% ONO 05/21/18 desat RA x 86 min > rec 05/30/18:  resume 2lpm - 06/10/2018   Walked RA x one lap = 210 ft - stopped due to  Backpain, sob s desat slow to avg pace  07/04/2018-   Amb O2 sat 87% RA, needs 3-4L pulsed to keep O2 >>90% - 11/09/2018   Walked RA x one lap =  approx 250 ft - slow pace/ stopped due to  Sob with sats fine and really not sob so much as back/ leg pains - 05/22/2019 notified on 2lpm 24/ 7 p ? covid 19 pna [facility] > records requested   Advised: Make sure you check your oxygen saturations at highest level of activity to be sure it stays over 90% and adjust upward to maintain this level if needed but remember to turn it back to previous settings when you stop (to conserve your supply).    Each maintenance medication was reviewed in detail including most importantly the difference between maintenance and as needed and under what circumstances the prns are to be used.  Please see AVS for specific  Instructions which are unique to this visit and I personally typed out  which were reviewed in detail over the phone with the patient and a copy provided via mail

## 2019-06-05 ENCOUNTER — Encounter: Payer: Self-pay | Admitting: Internal Medicine

## 2019-06-05 ENCOUNTER — Other Ambulatory Visit: Payer: Self-pay

## 2019-06-05 ENCOUNTER — Ambulatory Visit (INDEPENDENT_AMBULATORY_CARE_PROVIDER_SITE_OTHER): Payer: Medicare Other | Admitting: Internal Medicine

## 2019-06-05 DIAGNOSIS — J449 Chronic obstructive pulmonary disease, unspecified: Secondary | ICD-10-CM

## 2019-06-05 DIAGNOSIS — J9611 Chronic respiratory failure with hypoxia: Secondary | ICD-10-CM | POA: Diagnosis not present

## 2019-06-05 NOTE — Patient Instructions (Addendum)
Plan A = Automatic = Always=    Trelegy once a daily   Plan B = Backup (to supplement plan A, not to replace it) Only use your albuterol inhaler as a rescue medication to be used if you can't catch your breath by resting or doing a relaxed purse lip breathing pattern.  - The less you use it, the better it will work when you need it. - Ok to use the inhaler up to 2 puffs  every 4 hours if you must but call for appointment if use goes up over your usual need - Don't leave home without it !!  (think of it like the spare tire for your car)   Plan C = Crisis (instead of Plan B but only if Plan B stops working) - only use your albuterol nebulizer if you first try Plan B and it fails to help > ok to use the nebulizer up to every 4 hours but if start needing it regularly call for immediate appointment   Please schedule a follow up visit in  6  months but call sooner if needed

## 2019-06-05 NOTE — Progress Notes (Signed)
Subjective:   Patient ID: Herbert May, male    DOB: 17-Apr-1941,   MRN: [id number]    Brief patient profile:  21 yowm MM/quit smoking in 1992 with breathing problems then that worsened over the years esp since 2011 lived in [place] and moved to [place] in  2013 on 02 since early 2016 at hs only with GOLD II criteria 11/09/16    History of Present Illness  01/24/2015 1st [facility] office visit/ Herbert May  On flovent/ spiriva  Chief Complaint  Patient presents with  . Pulmonary Consult    Self referral. Pt states has had SOB "all of my life"- worse x 3 months. He states he is SOB all of the time, with or without exertion. He also c/o cough- prod with yellow sputum.    gradually worse doe x 4-5 years but can still do [employer] slowly but c/o sense of sob at rest assoc with congested cough and worse symptoms walking in hot humid conditions not much better with saba but doesn't really understand how to use them  rec Ok to use the combination of albuterol/ipatriprium up to 4 x daily  Only use your albuterol as a rescue medication pantoprazole 40  Mg Take 30- 60 min before your first and last meals of the day  GERD diet   07/31/16 NP  Pt presents for an acute office visit. Complains of persistent cough, congestion , dyspnea and wheezing . Worse for last 3-4 weeks .  Recently admitted 2 weeks ago at  [facility] for COPD flare , tx w/ abx and steroids . No discharge summary at todays visit. Request sent to hospital . Since got out of hospital he is not feeling much better. Still has cough/congestion and wheezing . Seen by PCP yesterday and started Doxycycline . Coughing up thick green mucus.  No chest pain or hemoptysis. Appetitie is down. No n/v/d.  Last seen in pulmonary clinic in 2016.  Previously on Symbicort and Spiriva in past but insurance did not cover .  On Duoneb Four times a day  . Insurance covers this well.  rec Finish Doxycycline .  Mucinex DM Twice daily  As needed   Cough/congestion  Continue on Duoneb Four times a day  .  Begin Oxygen 2l/m with activity  Order to DME for portable concentrator.  Begin BREO 1 puff daily .     10/30/2016  f/u ov/Herbert May re:  GOLD II copd / breo maint / overuse of saba noted / 02 2lpm  hs and prn with ambulation  Chief Complaint  Patient presents with  . Follow-up    PFT's done today. Breathing is unchanged. He is using proair 3-4 x per day and albuterol neb 4 x daily.   doe = MMRC2 usually s 1 rec Plan A = Automatic = try off the BREO  And replace it with Trelogy one click each am - take 2 long drags and hold 5 seconds each  Plan B = Backup Only use your albuterol (Proair)  as a rescue medication  Work on inhaler technique:    Plan C = Crisis - only use your albuterol/ipatropium nebulizer if you first try Plan B    ER in [place]  And Nov 2019 for copd / fast heart rate > last in  ER 05/04/18 rec pred x 5 day    05/13/2018 acute extended ov/Herbert May re: re-establish for COPD GOLD II / sob/cough some better p last ER trip / now maint on Trelegy  Chief Complaint  Patient presents with  . Acute Visit    Pt c/o increased SOB and cough for the past few months. His cough is prod with green sputum.  He is SOB with minimal exertion and also when he lies down. He is using his proair and albuterol neb each 4 x daily on average.   doe =  MMRC3 = can't walk 100 yards even at a slow pace at a flat grade s stopping due to sob, still walks to mb most days stops 4-5 x on way back  Cough  First thing am / green and thick x up to 15 min to clear it  Sleep R side down 2 pillows/ bed flat  SABA: way over using at baseline  rec Plan A = Automatic =  Continue Trelogy one click each am - take 2 long drags and hold 5 seconds each  Plan B = Backup Only use your albuterol (Proair)   Work on inhaler technique  Plan C = Crisis - only use your albuterol nebulizer if you first try Plan B and it fails to help > ok to use the nebulizer up to  every 4 hours but if start needing it regularly call for immediate appointment Protonix (pantoprazole ) should  Be Take 30- 60 min before your first and last meals of the day  GERD diet   Augmentin 875 mg take one pill twice daily  X 10 days - take at breakfast and supper with large glass of water.  It would help reduce the usual side effects (diarrhea and yeast infections) if you ate cultured yogurt at lunch.  For cough > mucinex or mucinex dm up to 1200 mg every 12 hours as needed and use the flutter valve as much as you can  Depomedrol 120 mg IM today     04/30/18  ONO on RA was pos for desat (done by [facility] 05/21/18) Per MW- start 2lpm o2 with sleep   06/10/2018  f/u ov/Herbert May re: copd GOLD II/ cb features/ poor insight into how/ when to use hfa vs neb - did not bring neb med but did bring all others and miant on  trelegy  Chief Complaint  Patient presents with  . Follow-up    Breathing has improved some. He is using his albuterol inhaler and neb both 2 x daily.   Dyspnea:  MMRC 3  Cough: variable denies worse in am Sleeping: fine on side on 2lpm/ did not use bed blocks as rec  SABA use: as above, still misunderstanding how to use hfa vs neb but  02: 2lpm hs only  rec  Protonix is supposed to be Take 30- 60 min before your first and last meals of the day  For cough > mucinex or mucinex dm up to 1200 mg every 12 hours as needed and use the flutter valve as much as you can    NP rec 07/04/18  Mucinex twice a day with full glass of water Use flutter valve 3-4 times a day Albuterol nebulizer every 4-6 hours as needed for shortness of breath or wheezing Continue Trelegy 1 puff daily  CXR and labs today Please qualify for POC      07/11/2018  f/u ov/Herbert May re:   COPD gold II/ maint trelegy -  Qualified for amb 02 and chronically on 2lpm hs but has not received portable system yet Chief Complaint  Patient presents with  . Follow-up    Breathing is some better since the  last visit. He  is using his albuterol inhaler and neb both several times per day.   Dyspnea:   able to do [employer] shopping easier than before, thinks trelegy is why even though not using 02 yet as rec  Cough: minimal mucoid in am's/ using flutter Sleeping: flat bed, on side/ 2 pillows  SABA use: as above 02: hs 2lpm, does not have portable system  rec We will see if we can troubleshoot why you don't have a portable system but only needs to be used with walking outside of house  No change in medications    11/09/2018  f/u ov/Williom Cedar re:  Copd II/ maint trelegy/ 02 2lpm only Chief Complaint  Patient presents with  . Follow-up    Breathing is about the same. He is using his proair and neb with albuterol both 2-3 x per day.  Dyspnea:  Gets to shop at [place]/ gets dropped off at door / not using 02 - slow walk to mb and back hilly Cough: no Sleeping: on side / bed is flat , 2 pillows  SABA use: up to twice daily when "overdoes it"  02: 2lpm at hs /not using during the day  rec Titrate sats    06/05/2019  f/u ov/Seraj Dunnam re:  Copd II / maint on trelegy  Chief Complaint  Patient presents with  . Follow-up    pt states he is stable, denies any increased sob, CP, cough, mucus production.    Dyspnea:  Shopping at [employer] at baseline  Cough: no  Sleeping: on back 2 pillows or couch  SABA use: still using lots of saba/ poor insight how/ when to use  02: 4lpm hs,  None at rest,  4lpm when walking    No obvious day to day or daytime variability or assoc excess/ purulent sputum or mucus plugs or hemoptysis or cp or chest tightness, subjective wheeze or overt sinus or hb symptoms.   Sleeping as above  without nocturnal  or early am exacerbation  of respiratory  c/o's or need for noct saba. Also denies any obvious fluctuation of symptoms with weather or environmental changes or other aggravating or alleviating factors except as outlined above   No unusual exposure hx or h/o childhood pna/ asthma or knowledge of  premature birth.  Current Allergies, Complete Past Medical History, Past Surgical History, Family History, and Social History were reviewed in [employer] record.  ROS  The following are not active complaints unless bolded Hoarseness, sore throat, dysphagia, dental problems, itching, sneezing,  nasal congestion or discharge of excess mucus or purulent secretions, ear ache,   fever, chills, sweats, unintended wt loss or wt gain, classically pleuritic or exertional cp,  orthopnea pnd or arm/hand swelling  or leg swelling, presyncope, palpitations, abdominal pain, anorexia, nausea, vomiting, diarrhea  or change in bowel habits or change in bladder habits, change in stools or change in urine, dysuria, hematuria,  rash, arthralgias, visual complaints, headache, numbness, weakness or ataxia or problems with walking or coordination,  change in mood or  memory.        Current Meds  Medication Sig  . albuterol (PROAIR HFA) 108 (90 BASE) MCG/ACT inhaler Inhale 1 puff into the lungs every 4 (four) hours as needed for wheezing or shortness of breath.  Marland Kitchen albuterol (PROVENTIL) (2.5 MG/3ML) 0.083% nebulizer solution INHALE CONTENTS OF 1 VIAL IN NEBULIZER EVERY 6 HOURS AS NEEDED FOR WHEEZING OR SHORTNESS OF BREATH.  . clopidogrel (PLAVIX) 75 MG  tablet Take 1 tablet (75 mg total) by mouth daily.  Marland Kitchen diltiazem (CARDIZEM) 30 MG tablet Take 1 tablet (30 mg total) by mouth 2 (two) times daily.  . fentaNYL (DURAGESIC - DOSED MCG/HR) 100 MCG/HR Apply one patch to skin every 3 days if tolerated  . fluticasone (FLONASE) 50 MCG/ACT nasal spray Place 2 sprays into both nostrils daily.  Marland Kitchen gabapentin (NEURONTIN) 300 MG capsule Take 1 capsule by mouth 3 (three) times daily.  Marland Kitchen linagliptin (TRADJENTA) 5 MG TABS tablet Take 5 mg by mouth daily.  . metFORMIN (GLUCOPHAGE) 500 MG tablet Take 500 mg by mouth 2 (two) times daily with a meal.  . nitroGLYCERIN (NITROSTAT) 0.4 MG SL tablet DISSOLVE ONE TABLET UNDER  TONGUE EVERY 5 MINUTES UP TO 3 DOSES AS NEEDED FOR CHEST PAIN  . oxyCODONE-acetaminophen (PERCOCET) 10-325 MG tablet Limit one tablet by mouth per day or 2-4 times per day for breakthrough pain while wearing fentanyl patch if tolerated  . OXYGEN 2 lpm with sleep  . pantoprazole (PROTONIX) 40 MG tablet Take 1 tablet (40 mg total) by mouth 2 (two) times daily.  . potassium chloride SA (K-DUR,KLOR-CON) 20 MEQ tablet Take 20 mEq by mouth daily.  . pravastatin (PRAVACHOL) 40 MG tablet Take 1 tablet by mouth daily.  Marland Kitchen Respiratory Therapy Supplies (FLUTTER) DEVI Use as directed  . tamsulosin (FLOMAX) 0.4 MG CAPS capsule Take 0.4 mg by mouth daily.  . traZODone (DESYREL) 100 MG tablet TAKE ONE TABLET BY MOUTH AT BEDTIME AS NEEDED FOR SLEEP (Patient taking differently: TAKE ONE TABLET BY MOUTH AT BEDTIME FOR SLEEP)  . TRELEGY ELLIPTA 100-62.5-25 MCG/INH AEPB INHALE ONE PUFF INTO THE LUNGS DAILY.  . trospium (SANCTURA) 20 MG tablet Take 20 mg by mouth 2 (two) times daily.                 Objective:   Physical Exam  amb wm min rattling   06/05/2019      198  11/09/2018       213  07/11/2018       205 06/10/2018     214  05/13/2018     211 10/30/2016         205   09/11/16 210 lb 3.2 oz (95.3 kg)  07/31/16 206 lb 9.6 oz (93.7 kg)  03/19/16 215 lb (97.5 kg)    Vital signs reviewed - Note on arrival 02 sats  95% on 4lpm   - after sitting on RA = 89-91%   HEENT : pt wearing mask not removed for exam due to covid -19 concerns.    NECK :  without JVD/Nodes/TM/ nl carotid upstrokes bilaterally   LUNGS: no acc muscle use,  Mod barrel  contour chest wall with bilateral  Distant bs s audible wheeze and  without cough on insp or exp maneuvers and mod  Hyperresonant  to  percussion bilaterally     CV:  RRR  no s3 or murmur or increase in P2, and trace pitting edema both L >R   ABD:  Obese/soft and nontender with pos mid insp Hoover's  in the supine position. No bruits or organomegaly appreciated,  bowel sounds nl  MS:     ext warm without deformities, calf tenderness, cyanosis or clubbing No obvious joint restrictions   SKIN: warm and dry without lesions    NEURO:  alert, approp, nl sensorium with  no motor or cerebellar deficits apparent.  Assessment & Plan:

## 2019-06-06 ENCOUNTER — Encounter: Payer: Self-pay | Admitting: Internal Medicine

## 2019-06-06 ENCOUNTER — Telehealth: Payer: Self-pay | Admitting: Internal Medicine

## 2019-06-06 NOTE — Telephone Encounter (Signed)
Spoke with pt's wife, aware of recs.  Nothing further needed at this time- will close encounter.   

## 2019-06-06 NOTE — Telephone Encounter (Signed)
Should be ok to just adjust the flow to keep sats in low 90s but the continuous flow will give a more accurate delivery than pulsed so whenever there's an issue should first try adjusting it with continuous  - if can't get sats above 90% on continous flow  (if this happens) that's when need to go to ER

## 2019-06-06 NOTE — Telephone Encounter (Signed)
Spoke with pt's wife Joycelyn Schmid, states that pt's O2 levels are reading in the low 80's on 4lpm pulse dose.  Joycelyn Schmid states that pt's hands are warm, pt is shaky and sob, and is anxious from low O2 readings.  I advised wife to increase O2 to 5lpm and see if his O2 levels increase.  This brought O2 levels up to 92%.   Pt denies chest pain, cough, mucus production, sinus congestion, chills, fever.  Wife states that pt woke up at 10:00 and was like this.   Pt was seen yesterday by MW, wife does not want to take pt to ED or UC.    MW please advise on recs.  Thanks.

## 2019-06-06 NOTE — Assessment & Plan Note (Signed)
Quit smoking 1992  - MMRC 2 at first pulmonary ov 01/24/15  - 01/24/2015  Walked RA x 3 laps @ 185 ft each stopped due to  End of study, slow pace, no sob or desat   - 01/24/2015 p extensive coaching HFA effectiveness =   75% > try symbicort 160 2bid > pt d/c'd on his own  - PFT's  02/07/2015  FEV1 1.44 (52 % ) ratio 52  p no % improvement from saba with DLCO  58 % corrects to 85 % for alv volume   - 09/11/2016  Try BREO - PFT's  10/30/2016  FEV1 1.63 (61 % ) ratio 56  p no % improvement from saba p duob around 4 h prior to study with DLCO  70/69 % corrects to 90 % for alv volume    - 10/30/2016 trial of Trelogy as using way too much saba at baseline > still overusing saba as of 05/13/2018  - 05/13/2018 added flutter  06/10/2018  Alpha one AT screening MM/ level 148  07/04/2018-    Amb O2 sat 87% RA, needs 3-4L pulsed to keep O2 >>90% - 11/09/2018  After extensive coaching inhaler device,  effectiveness =    90%  elipta   Group D in terms of symptom/risk and laba/lama/ICS  therefore appropriate rx at this point >>>  Continue trelegy  I spent extra time with pt today reviewing appropriate use of albuterol for prn use on exertion with the following points: 1) saba is for relief of sob that does not improve by walking a slower pace or resting but rather if the pt does not improve after trying this first. 2) If the pt is convinced, as many are, that saba helps recover from activity faster then it's easy to tell if this is the case by re-challenging : ie stop, take the inhaler, then p 5 minutes try the exact same activity (intensity of workload) that just caused the symptoms and see if they are substantially diminished or not after saba 3) if there is an activity that reproducibly causes the symptoms, try the saba 15 min before the activity on alternate days   If in fact the saba really does help, then fine to continue to use it prn but advised may need to look closer at the maintenance regimen being used to  achieve better control of airways disease with exertion.

## 2019-06-06 NOTE — Assessment & Plan Note (Addendum)
ono RA ordered  01/29/15  < 89% @ 65 m 36sec > rec 2lpm at hs  Added prn 02 with activity 07/31/16 with goal of keeping sats > 90% ONO 05/21/18 desat RA x 86 min > rec 05/30/18:  resume 2lpm - 06/10/2018   Walked RA x one lap = 210 ft - stopped due to  Backpain, sob s desat slow to avg pace  07/04/2018-   Amb O2 sat 87% RA, needs 3-4L pulsed to keep O2 >>90% - 11/09/2018   Walked RA x one lap =  approx 250 ft - slow pace/ stopped due to  Sob with sats fine and really not sob so much as back/ leg pains - 05/22/2019 notified on 2lpm 24/ 7 p ? covid 19 pna [facility] > records requested  Have not received records from [facility] but pt using 02 approp and appears to be benefiting  Advised goal is to keep sats > 90% and not run out of portable while away from home so adjust down/off at rest as we demonstrated today is safe   >>> f/u in 6 m, call sooner if needed

## 2019-06-14 ENCOUNTER — Telehealth: Payer: Self-pay | Admitting: Internal Medicine

## 2019-06-14 NOTE — Telephone Encounter (Signed)
Dr. Primus Bravo calling back - he can be reached at [phone] -pr

## 2019-06-14 NOTE — Telephone Encounter (Signed)
lmtcb for Damita Dunnings at Dr. Ethel Rana office

## 2019-06-14 NOTE — Telephone Encounter (Signed)
Spoke with Damita Dunnings at Dr. Ethel Rana office. He stated that the letter he received on 06/05/19 was not detailed enough. They need to know when the patient was diagnosed with COVID and if the patient has recovered from [place]. Offered to send the office notes from last visit but he stated that he needs a letter.   Called patient to see if he is familiar with Dr. Primus Bravo. Patient stated that Dr. Primus Bravo is his pain management doctor and he is ok with [place] releasing his information to them.   Dr. Melvyn Novas, please advise if you are ok with writing a letter on his condition based on his last office visit. Thanks!

## 2019-06-15 NOTE — Telephone Encounter (Signed)
Letter typed and faxed back to Dr. Ethel Rana office as requested. Called and LM for [facility]'s medical records department to fax [place] DC summary- since pt had his hospitalization follow up with our office this should be able to be sent without a ROI being signed by the patient.  Nothing further needed at this time- will close encounter.

## 2019-06-15 NOTE — Telephone Encounter (Signed)
I have not received any records from [facility] where he was admitted in November 2020 with dx of covid 19 pneumonia.  However, he has had no new symptoms since then to suggest active covid and is over 21 days out by now so that should not be a concern with seeing him - of course should continue universal masking /hand washing precautions.   Ok to write above letter - also request d/c summary from [facility].

## 2019-06-16 ENCOUNTER — Telehealth: Payer: Self-pay | Admitting: Internal Medicine

## 2019-06-16 NOTE — Telephone Encounter (Signed)
Left message for patient to call back  

## 2019-06-19 NOTE — Telephone Encounter (Signed)
ATC pt, no answer. Left message for pt to call back.  

## 2019-06-20 NOTE — Telephone Encounter (Signed)
Attempted to call pt but unable to reach.left message for pt to return call. Due to multiple attempts trying to reach pt and unable to do so, per triage protocol encounter will be closed.

## 2019-07-11 ENCOUNTER — Telehealth: Payer: Self-pay | Admitting: Internal Medicine

## 2019-07-11 ENCOUNTER — Encounter: Payer: Self-pay | Admitting: *Deleted

## 2019-07-11 NOTE — Telephone Encounter (Signed)
Dr Primus Bravo of calling wantng to speak to Dr Melvyn Novas nurse Damita Dunnings he can be reached @ [phone] ask for him, pt trying to get valium for pain he's trying to figure out if Dr Melvyn Novas has prescribed this for him, please advise.Herbert May

## 2019-07-11 NOTE — Telephone Encounter (Signed)
Letter done and faxed  I called and left a detailed msg for the pt to be made aware

## 2019-07-11 NOTE — Telephone Encounter (Signed)
Fine to write letter:  Based on his lung disease, there is no contraindication to treating his pain to moderately controlled levels. However, attempts to completely eliminate the pain are not reasonable goals given the risk of hypercarbia unless or until we shift to a palliative mode of care/ NCB status.

## 2019-07-11 NOTE — Telephone Encounter (Signed)
Spoke with the pt  He states that he wants [place] to fax a letter to his pain management provider, Dr Primus Bravo, letting him know that his breathing is doing okay He states that they had cut him back on his pain pills bc they thought it was affecting his breathing but it's not   He wants to be able to get more pain medication  Please advise thanks

## 2019-10-12 ENCOUNTER — Other Ambulatory Visit: Payer: Self-pay | Admitting: Adult Health

## 2019-12-13 ENCOUNTER — Ambulatory Visit (INDEPENDENT_AMBULATORY_CARE_PROVIDER_SITE_OTHER): Payer: Medicare Other | Admitting: Internal Medicine

## 2019-12-13 ENCOUNTER — Other Ambulatory Visit: Payer: Self-pay

## 2019-12-13 ENCOUNTER — Encounter: Payer: Self-pay | Admitting: Internal Medicine

## 2019-12-13 ENCOUNTER — Telehealth: Payer: Self-pay | Admitting: Internal Medicine

## 2019-12-13 DIAGNOSIS — J9611 Chronic respiratory failure with hypoxia: Secondary | ICD-10-CM

## 2019-12-13 DIAGNOSIS — J449 Chronic obstructive pulmonary disease, unspecified: Secondary | ICD-10-CM | POA: Diagnosis not present

## 2019-12-13 MED ORDER — TRELEGY ELLIPTA 100-62.5-25 MCG/INH IN AEPB: INHALATION_SPRAY | RESPIRATORY_TRACT | 11 refills | Status: DC

## 2019-12-13 MED ORDER — TRELEGY ELLIPTA 100-62.5-25 MCG/INH IN AEPB: 1.0000 | INHALATION_SPRAY | Freq: Every day | RESPIRATORY_TRACT | 0 refills | Status: DC

## 2019-12-13 NOTE — Assessment & Plan Note (Signed)
Body mass index is 31.78 kg/m.  -  trending up again  Lab Results  Component Value Date   TSH 3.05 05/13/2018     Contributing to gerd risk/ doe/reviewed the need and the process to achieve and maintain neg calorie balance > defer f/u primary care including intermittently monitoring thyroid status           Each maintenance medication was reviewed in detail including emphasizing most importantly the difference between maintenance and prns and under what circumstances the prns are to be triggered using an action plan format where appropriate.  Total time for H and P, chart review, counseling, teaching device using teach back method and generating customized AVS unique to this pre op consult  office visit / charting = 40 min

## 2019-12-13 NOTE — Assessment & Plan Note (Signed)
Quit smoking 1992  - MMRC 2 at first pulmonary ov 01/24/15  - 01/24/2015  Walked RA x 3 laps @ 185 ft each stopped due to  End of study, slow pace, no sob or desat   - 01/24/2015 p extensive coaching HFA effectiveness =   75% > try symbicort 160 2bid > pt d/c'd on his own  - PFT's  02/07/2015  FEV1 1.44 (52 % ) ratio 52  p no % improvement from saba with DLCO  58 % corrects to 85 % for alv volume   - 09/11/2016  Try BREO - PFT's  10/30/2016  FEV1 1.63 (61 % ) ratio 56  p no % improvement from saba p duob around 4 h prior to study with DLCO  70/69 % corrects to 90 % for alv volume    - 10/30/2016 trial of Trelogy as using way too much saba at baseline > still overusing saba as of 05/13/2018  - 05/13/2018 added flutter  06/10/2018  Alpha one AT screening MM/ level 148  07/04/2018-    Amb O2 sat 87% RA, needs 3-4L pulsed to keep O2 >>90% -  12/13/2019  After extensive coaching inhaler device,  effectiveness =    90% with elipta    Group D in terms of symptom/risk and laba/lama/ICS  therefore appropriate rx at this point >>>  Continue trelegy and use saba prn  I spent extra time with pt today reviewing appropriate use of albuterol for prn use on exertion with the following points: 1) saba is for relief of sob that does not improve by walking a slower pace or resting but rather if the pt does not improve after trying this first. 2) If the pt is convinced, as many are, that saba helps recover from activity faster then it's easy to tell if this is the case by re-challenging : ie stop, take the inhaler, then p 5 minutes try the exact same activity (intensity of workload) that just caused the symptoms and see if they are substantially diminished or not after saba 3) if there is an activity that reproducibly causes the symptoms, try the saba 15 min before the activity on alternate days   If in fact the saba really does help, then fine to continue to use it prn but advised may need to look closer at the maintenance  regimen being used to achieve better control of airways disease with exertion.

## 2019-12-13 NOTE — Telephone Encounter (Signed)
No problem, I got the info I wanted from [facility]

## 2019-12-13 NOTE — Assessment & Plan Note (Addendum)
ono RA ordered  01/29/15  < 89% @ 82 m 36sec > rec 2lpm at hs  Added prn 02 with activity 07/31/16 with goal of keeping sats > 90% ONO 05/21/18 desat RA x 86 min > rec 05/30/18:  resume 2lpm - 06/10/2018   Walked RA x one lap = 210 ft - stopped due to  Backpain, sob s desat slow to avg pace  07/04/2018-   Amb O2 sat 87% RA, needs 3-4L pulsed to keep O2 >>90% - 11/09/2018   Walked RA x one lap =  approx 250 ft - slow pace/ stopped due to  Sob with sats fine and really not sob so much as back/ leg pains - 05/22/2019 notified on 2lpm 24/ 7 p ? covid 19 pna [facility]  - as of 12/13/2019 just using 2lpm hs   Well compensated on just 2lpm though advised : Make sure you check your oxygen saturations at highest level of activity to be sure it stays over 90% and call is losing ground  The greatest concern I have for surgery is that he'll need bipap bridge post op due to obesity not copd flare/ advised  Discussed in detail all the  indications, usual  risks and alternatives  relative to the benefits with patient who agrees to proceed with plan as outlined though I am skeptical he get's the full picture of the challenge ahead as cannot even recall the surgeon's name who is offering to try to repair the hernia.

## 2019-12-13 NOTE — Telephone Encounter (Signed)
Spoke with patient. He stated that he was calling back to give Dr. Melvyn Novas the number and name of person doing the surgery. He stated the office number is [phone] and to ask for [place].   I called the number but [facility] wasn't available.   Will route to MW so he is aware.

## 2019-12-13 NOTE — Patient Instructions (Addendum)
Plan A = Automatic = Always=   Trelegy 488 one click each am especially for the week before surgery    Plan B = Backup (to supplement plan A, not to replace it) Only use your albuterol inhaler as a rescue medication to be used if you can't catch your breath by resting or doing a relaxed purse lip breathing pattern.  - The less you use it, the better it will work when you need it. - Ok to use the inhaler up to 2 puffs  every 4 hours if you must but call for appointment if use goes up over your usual need - Don't leave home without it !!  (think of it like the spare tire for your car)   Plan C = Crisis (instead of Plan B but only if Plan B stops working) - only use your albuterol nebulizer if you first try Plan B and it fails to help > ok to use the nebulizer up to every 4 hours but if start needing it regularly call for immediate appointment     Call us with the name of your surgeon = Dr Tally Due   Please schedule a follow up visit in 6 months but call sooner if needed

## 2019-12-13 NOTE — Progress Notes (Signed)
Subjective:   Patient ID: Herbert May, male    DOB: [birth year],   MRN: [id number]    Brief patient profile:  59 yowm MM/quit smoking in 1992 with breathing problems then that worsened over the years esp since 2011 lived in [place] and moved to [place] in  2013 on 02 since early 2016 at hs only with GOLD II criteria 11/09/16    History of Present Illness  01/24/2015 1st [facility] office visit/ Benino Korinek  On flovent/ spiriva  Chief Complaint  Patient presents with  . Pulmonary Consult    Self referral. Pt states has had SOB "all of my life"- worse x 3 months. He states he is SOB all of the time, with or without exertion. He also c/o cough- prod with yellow sputum.    gradually worse doe x 4-5 years but can still do [employer] slowly but c/o sense of sob at rest assoc with congested cough and worse symptoms walking in hot humid conditions not much better with saba but doesn't really understand how to use them  rec Ok to use the combination of albuterol/ipatriprium up to 4 x daily  Only use your albuterol as a rescue medication pantoprazole 40  Mg Take 30- 60 min before your first and last meals of the day  GERD diet   07/31/16 NP  Pt presents for an acute office visit. Complains of persistent cough, congestion , dyspnea and wheezing . Worse for last 3-4 weeks .  Recently admitted 2 weeks ago at  [facility] for COPD flare , tx w/ abx and steroids . No discharge summary at todays visit. Request sent to hospital . Since got out of hospital he is not feeling much better. Still has cough/congestion and wheezing . Seen by PCP yesterday and started Doxycycline . Coughing up thick green mucus.  No chest pain or hemoptysis. Appetitie is down. No n/v/d.  Last seen in pulmonary clinic in 2016.  Previously on Symbicort and Spiriva in past but insurance did not cover .  On Duoneb Four times a day  . Insurance covers this well.  rec Finish Doxycycline .  Mucinex DM Twice daily  As needed   Cough/congestion  Continue on Duoneb Four times a day  .  Begin Oxygen 2l/m with activity  Order to DME for portable concentrator.  Begin BREO 1 puff daily .     10/30/2016  f/u ov/Dodger Sinning re:  GOLD II copd / breo maint / overuse of saba noted / 02 2lpm  hs and prn with ambulation  Chief Complaint  Patient presents with  . Follow-up    PFT's done today. Breathing is unchanged. He is using proair 3-4 x per day and albuterol neb 4 x daily.   doe = MMRC2 usually s 22 rec Plan A = Automatic = try off the BREO  And replace it with Trelogy one click each am - take 2 long drags and hold 5 seconds each  Plan B = Backup Only use your albuterol (Proair)  as a rescue medication  Work on inhaler technique:    Plan C = Crisis - only use your albuterol/ipatropium nebulizer if you first try Plan B    ER in [place]  And Nov 2019 for copd / fast heart rate > last in  ER 05/04/18 rec pred x 5 day    05/13/2018 acute extended ov/Kemani Demarais re: re-establish for COPD GOLD II / sob/cough some better p last ER trip / now maint on Trelegy  Chief Complaint  Patient presents with  . Acute Visit    Pt c/o increased SOB and cough for the past few months. His cough is prod with green sputum.  He is SOB with minimal exertion and also when he lies down. He is using his proair and albuterol neb each 4 x daily on average.   doe =  MMRC3 = can't walk 100 yards even at a slow pace at a flat grade s stopping due to sob, still walks to mb most days stops 4-5 x on way back  Cough  First thing am / green and thick x up to 15 min to clear it  Sleep R side down 2 pillows/ bed flat  SABA: way over using at baseline  rec Plan A = Automatic =  Continue Trelogy one click each am - take 2 long drags and hold 5 seconds each  Plan B = Backup Only use your albuterol (Proair)   Work on inhaler technique  Plan C = Crisis - only use your albuterol nebulizer if you first try Plan B and it fails to help > ok to use the nebulizer up to  every 4 hours but if start needing it regularly call for immediate appointment Protonix (pantoprazole ) should  Be Take 30- 60 min before your first and last meals of the day  GERD diet   Augmentin 875 mg take one pill twice daily  X 10 days - take at breakfast and supper with large glass of water.  It would help reduce the usual side effects (diarrhea and yeast infections) if you ate cultured yogurt at lunch.  For cough > mucinex or mucinex dm up to 1200 mg every 12 hours as needed and use the flutter valve as much as you can  Depomedrol 120 mg IM today     04/30/18  ONO on RA was pos for desat (done by [facility] 05/21/18) Per MW- start 2lpm o2 with sleep   06/10/2018  f/u ov/Merdith Adan re: copd GOLD II/ cb features/ poor insight into how/ when to use hfa vs neb - did not bring neb med but did bring all others and miant on  trelegy  Chief Complaint  Patient presents with  . Follow-up    Breathing has improved some. He is using his albuterol inhaler and neb both 2 x daily.   Dyspnea:  MMRC 3  Cough: variable denies worse in am Sleeping: fine on side on 2lpm/ did not use bed blocks as rec  SABA use: as above, still misunderstanding how to use hfa vs neb but  02: 2lpm hs only  rec  Protonix is supposed to be Take 30- 60 min before your first and last meals of the day  For cough > mucinex or mucinex dm up to 1200 mg every 12 hours as needed and use the flutter valve as much as you can    NP rec 07/04/18  Mucinex twice a day with full glass of water Use flutter valve 3-4 times a day Albuterol nebulizer every 4-6 hours as needed for shortness of breath or wheezing Continue Trelegy 1 puff daily  CXR and labs today Please qualify for POC      07/11/2018  f/u ov/Reisa Coppola re:   COPD gold II/ maint trelegy -  Qualified for amb 02 and chronically on 2lpm hs but has not received portable system yet Chief Complaint  Patient presents with  . Follow-up    Breathing is some better since the  last visit. He  is using his albuterol inhaler and neb both several times per day.   Dyspnea:   able to do [employer] shopping easier than before, thinks trelegy is why even though not using 02 yet as rec  Cough: minimal mucoid in am's/ using flutter Sleeping: flat bed, on side/ 2 pillows  SABA use: as above 02: hs 2lpm, does not have portable system  rec We will see if we can troubleshoot why you don't have a portable system but only needs to be used with walking outside of house  No change in medications    11/09/2018  f/u ov/Jyrah Blye re:  Copd II/ maint trelegy/ 02 2lpm only Chief Complaint  Patient presents with  . Follow-up    Breathing is about the same. He is using his proair and neb with albuterol both 2-3 x per day.  Dyspnea:  Gets to shop at [place]/ gets dropped off at door / not using 02 - slow walk to mb and back hilly Cough: no Sleeping: on side / bed is flat , 2 pillows  SABA use: up to twice daily when "overdoes it"  02: 2lpm at hs /not using during the day  rec Titrate sats    06/05/2019  f/u ov/Kamaree Wheatley re:  Copd II / maint on trelegy  Chief Complaint  Patient presents with  . Follow-up    pt states he is stable, denies any increased sob, CP, cough, mucus production.    Dyspnea:  Shopping at [employer] at baseline  Cough: no  Sleeping: on back 2 pillows or couch  SABA use: still using lots of saba/ poor insight how/ when to use  02: 4lpm hs,  None at rest,  4lpm when walking  rec Plan A = Automatic = Always=    Trelegy once a daily  Plan B = Backup (to supplement plan A, not to replace it) Only use your albuterol inhaler as a rescue medication   Plan C = Crisis (instead of Plan B but only if Plan B stops working) - only use your albuterol nebulizer if you first try Plan B and it fails to help > ok to use the nebulizer up to every 4 hours but if start needing it regularly call for immediate appointment      12/13/2019  preop consult /Renny Remer re: GOLD  II / maint on trelegy /  Lots of saba  and confused with maint vs prns For hernia surgery in [place] by Tally Due but could not recall his or his pcp's name  Chief Complaint  Patient presents with  . Follow-up    Breathing is overall doing well. He needs pulmonary clearance for hernia surgery. He is using his albuterol inhaler and neb both about 2 x per day.   Dyspnea:  MMRC2 = can't walk a nl pace on a flat grade s sob but does fine slow and flat shopping at [employer] or [employer] with cane  Cough: not much lately Sleeping: flat bed, 2 pillows / no nocturnal resp cc's SABA use: denies pre challenging / using a lot of saba more so since stopped trelegy when "ran out'  02: just 2lpm hs only    No obvious day to day or daytime variability or assoc excess/ purulent sputum or mucus plugs or hemoptysis or cp or chest tightness, subjective wheeze or overt sinus or hb symptoms.   Sleeping as above without nocturnal  or early am exacerbation  of respiratory  c/o's or  need for noct saba. Also denies any obvious fluctuation of symptoms with weather or environmental changes or other aggravating or alleviating factors except as outlined above   No unusual exposure hx or h/o childhood pna/ asthma or knowledge of premature birth.  Current Allergies, Complete Past Medical History, Past Surgical History, Family History, and Social History were reviewed in [employer] record.  ROS  The following are not active complaints unless bolded Hoarseness, sore throat, dysphagia, dental problems, itching, sneezing,  nasal congestion or discharge of excess mucus or purulent secretions, ear ache,   fever, chills, sweats, unintended wt loss or wt gain, classically pleuritic or exertional cp,  orthopnea pnd or arm/hand swelling  or leg swelling, presyncope, palpitations, abdominal pain, anorexia, nausea, vomiting, diarrhea  or change in bowel habits or change in bladder habits, change in stools or change in urine, dysuria, hematuria,   rash, arthralgias, visual complaints, headache, numbness, weakness or ataxia or problems with walking or coordination,  change in mood or  memory.        Current Meds  Medication Sig  . albuterol (PROAIR HFA) 108 (90 BASE) MCG/ACT inhaler Inhale 1 puff into the lungs every 4 (four) hours as needed for wheezing or shortness of breath.  Marland Kitchen albuterol (PROVENTIL) (2.5 MG/3ML) 0.083% nebulizer solution INHALE CONTENTS OF 1 VIAL IN NEBULIZER EVERY 6 HOURS AS NEEDED FOR WHEEZING OR SHORTNESS OF BREATH.  . clopidogrel (PLAVIX) 75 MG tablet Take 1 tablet (75 mg total) by mouth daily.  Marland Kitchen diltiazem (CARDIZEM) 30 MG tablet Take 1 tablet (30 mg total) by mouth 2 (two) times daily.  . fentaNYL (DURAGESIC - DOSED MCG/HR) 100 MCG/HR Apply one patch to skin every 3 days if tolerated  . fluticasone (FLONASE) 50 MCG/ACT nasal spray Place 2 sprays into both nostrils daily.  . furosemide (LASIX) 20 MG tablet Take 20 mg by mouth daily.  Marland Kitchen gabapentin (NEURONTIN) 300 MG capsule Take 1 capsule by mouth 3 (three) times daily.  Marland Kitchen ipratropium (ATROVENT) 0.03 % nasal spray Place 2 sprays into both nostrils every 12 (twelve) hours.  Marland Kitchen linagliptin (TRADJENTA) 5 MG TABS tablet Take 5 mg by mouth daily.  . metFORMIN (GLUCOPHAGE) 500 MG tablet Take 500 mg by mouth 2 (two) times daily with a meal.  . nitroGLYCERIN (NITROSTAT) 0.4 MG SL tablet DISSOLVE ONE TABLET UNDER TONGUE EVERY 5 MINUTES UP TO 3 DOSES AS NEEDED FOR CHEST PAIN  . oxyCODONE-acetaminophen (PERCOCET) 10-325 MG tablet Limit one tablet by mouth per day or 2-4 times per day for breakthrough pain while wearing fentanyl patch if tolerated  . OXYGEN 2 lpm with sleep  . pantoprazole (PROTONIX) 40 MG tablet Take 1 tablet (40 mg total) by mouth 2 (two) times daily.  . potassium chloride SA (K-DUR,KLOR-CON) 20 MEQ tablet Take 20 mEq by mouth daily.  . pravastatin (PRAVACHOL) 40 MG tablet Take 1 tablet by mouth daily.  Marland Kitchen Respiratory Therapy Supplies (FLUTTER) DEVI Use as  directed  . tamsulosin (FLOMAX) 0.4 MG CAPS capsule Take 0.4 mg by mouth daily.  . traZODone (DESYREL) 100 MG tablet TAKE ONE TABLET BY MOUTH AT BEDTIME AS NEEDED FOR SLEEP (Patient taking differently: TAKE ONE TABLET BY MOUTH AT BEDTIME FOR SLEEP)  . TRELEGY ELLIPTA 100-62.5-25 MCG/INH AEPB INHALE ONE PUFF INTO THE LUNGS DAILY.  . trospium (SANCTURA) 20 MG tablet Take 20 mg by mouth 2 (two) times daily.  Objective:   Physical Exam   obese hoarse chronically ill wm nad   12/13/2019      209 06/05/2019      198  11/09/2018       213  07/11/2018       205 06/10/2018     214  05/13/2018     211 10/30/2016         205   09/11/16 210 lb 3.2 oz (95.3 kg)  07/31/16 206 lb 9.6 oz (93.7 kg)  03/19/16 215 lb (97.5 kg)      Vital signs reviewed  12/13/2019  - Note at rest 02 sats  92% on RA       HEENT : pt wearing mask not removed for exam due to covid -19 concerns.    NECK :  without JVD/Nodes/TM/ nl carotid upstrokes bilaterally   LUNGS: no acc muscle use,  Mod barrel  contour chest wall with bilateral  Distant bs s audible wheeze and  without cough on insp or exp maneuvers and mod  Hyperresonant  to  percussion bilaterally     CV:  RRR  no s3 or murmur or increase in P2, and no edema   ABD:  Obese with large ventral hernia/ poor excursion  with pos mid insp Hoover's  in the supine position. No bruits or organomegaly appreciated, bowel sounds nl  MS:     ext warm without deformities, calf tenderness, cyanosis or clubbing No obvious joint restrictions   SKIN: warm and dry without lesions    NEURO:  alert, approp, nl sensorium with  no motor or cerebellar deficits apparent.             Assessment & Plan:

## 2020-07-01 ENCOUNTER — Telehealth: Payer: Self-pay | Admitting: Internal Medicine

## 2020-07-01 MED ORDER — PREDNISONE 10 MG PO TABS: ORAL_TABLET | ORAL | 0 refills | Status: DC

## 2020-07-01 NOTE — Telephone Encounter (Signed)
That's fine, ov with all meds in hand  In meantime if breathing not responding to albuterol > Prednisone 10 mg take  4 each am x 2 days,   2 each am x 2 days,  1 each am x 2 days and stop

## 2020-07-01 NOTE — Telephone Encounter (Signed)
Spoke with the pt's spouse and notified of response per Dr Sherene Sires  She states albuterol is not helping at all and needs pred  Rx was sent to pharm  Appt scheduled for 07/05/20 at 9:45 am

## 2020-07-01 NOTE — Telephone Encounter (Signed)
Spoke with the pt's spouse  She states pt has been having increased SOB since woke up 06/25/20- went to ED [facility] but was not admitted  She states he is still taking the Doxy that they prescribed him, but they did not give him any steroids  She states he is not coughing or wheezing and denies fever, aches  He has had 2 covid vaccines but no booster  Spouse states he was taken off of trelegy x 2 months ago due to his cardiac problems and he has maintenance inhaler so is using albuterol inhaler and neb 2 x per day on average  Spouse is asking if pt can come in 07/05/20 and see Dr Sherene Sires  There are only blocked spots available  Please advise thanks!    Last ov AVS:  Plan A = Automatic = Always=   Trelegy 100 one click each am especially for the week before surgery      Plan B = Backup (to supplement plan A, not to replace it) Only use your albuterol inhaler as a rescue medication to be used if you can't catch your breath by resting or doing a relaxed purse lip breathing pattern.  - The less you use it, the better it will work when you need it. - Ok to use the inhaler up to 2 puffs  every 4 hours if you must but call for appointment if use goes up over your usual need - Don't leave home without it !!  (think of it like the spare tire for your car)    Plan C = Crisis (instead of Plan B but only if Plan B stops working) - only use your albuterol nebulizer if you first try Plan B and it fails to help > ok to use the nebulizer up to every 4 hours but if start needing it regularly call for immediate appointment       Call us with the name of your surgeon = Dr Jaynie Bream     Please schedule a follow up visit in 6 months but call sooner if needed

## 2020-07-05 ENCOUNTER — Ambulatory Visit: Payer: Medicare Other | Admitting: Internal Medicine

## 2020-07-05 ENCOUNTER — Encounter: Payer: Self-pay | Admitting: Pulmonary Disease

## 2020-07-05 ENCOUNTER — Other Ambulatory Visit: Payer: Self-pay

## 2020-07-05 ENCOUNTER — Ambulatory Visit (INDEPENDENT_AMBULATORY_CARE_PROVIDER_SITE_OTHER): Payer: Medicare Other | Admitting: Pulmonary Disease

## 2020-07-05 VITALS — BP 132/74 | HR 127 | Temp 99.4°F | Ht 67.0 in | Wt 200.2 lb

## 2020-07-05 DIAGNOSIS — J9611 Chronic respiratory failure with hypoxia: Secondary | ICD-10-CM

## 2020-07-05 DIAGNOSIS — J449 Chronic obstructive pulmonary disease, unspecified: Secondary | ICD-10-CM | POA: Diagnosis not present

## 2020-07-05 MED ORDER — BREZTRI AEROSPHERE 160-9-4.8 MCG/ACT IN AERO: 2.0000 | INHALATION_SPRAY | Freq: Two times a day (BID) | RESPIRATORY_TRACT | 0 refills | Status: DC

## 2020-07-05 NOTE — Progress Notes (Deleted)
Subjective:   Patient ID: Herbert May, male    DOB: 14-Aug-1940,   MRN: [id number]    Brief patient profile:  81 yowm MM/quit smoking in 1992 with breathing problems then that worsened over the years esp since 2011 lived in [place] and moved to [place] in  2013 on 02 since early 2016 at hs only with GOLD II criteria 11/09/16    History of Present Illness  01/24/2015 1st [name] office visit/ Trelyn Vanderlinde  On flovent/ spiriva  Chief Complaint  Patient presents with  . Pulmonary Consult    Self referral. Pt states has had SOB "all of my life"- worse x 3 months. He states he is SOB all of the time, with or without exertion. He also c/o cough- prod with yellow sputum.    gradually worse doe x 4-5 years but can still do [employer] slowly but c/o sense of sob at rest assoc with congested cough and worse symptoms walking in hot humid conditions not much better with saba but doesn't really understand how to use them  rec Ok to use the combination of albuterol/ipatriprium up to 4 x daily  Only use your albuterol as a rescue medication pantoprazole 40  Mg Take 30- 60 min before your first and last meals of the day  GERD diet   07/31/16 NP  Pt presents for an acute office visit. Complains of persistent cough, congestion , dyspnea and wheezing . Worse for last 3-4 weeks .  Recently admitted 2 weeks ago at  [facility] for COPD flare , tx w/ abx and steroids . No discharge summary at todays visit. Request sent to hospital . Since got out of hospital he is not feeling much better. Still has cough/congestion and wheezing . Seen by PCP yesterday and started Doxycycline . Coughing up thick green mucus.  No chest pain or hemoptysis. Appetitie is down. No n/v/d.  Last seen in pulmonary clinic in 2016.  Previously on Symbicort and Spiriva in past but insurance did not cover .  On Duoneb Four times a day  . Insurance covers this well.  rec Finish Doxycycline .  Mucinex DM Twice daily  As needed   Cough/congestion  Continue on Duoneb Four times a day  .  Begin Oxygen 2l/m with activity  Order to DME for portable concentrator.  Begin BREO 1 puff daily .     10/30/2016  f/u ov/[name] re:  GOLD II copd / breo maint / overuse of saba noted / 02 2lpm  hs and prn with ambulation  Chief Complaint  Patient presents with  . Follow-up    PFT's done today. Breathing is unchanged. He is using proair 3-4 x per day and albuterol neb 4 x daily.   doe = MMRC2 usually s 70 rec Plan A = Automatic = try off the BREO  And replace it with Trelogy one click each am - take 2 long drags and hold 5 seconds each  Plan B = Backup Only use your albuterol (Proair)  as a rescue medication  Work on inhaler technique:    Plan C = Crisis - only use your albuterol/ipatropium nebulizer if you first try Plan B    ER in [place]  And Nov 2019 for copd / fast heart rate > last in  ER 05/04/18 rec pred x 5 day    05/13/2018 acute extended ov/[name] re: re-establish for COPD GOLD II / sob/cough some better p last ER trip / now maint on Trelegy  Chief Complaint  Patient presents with  . Acute Visit    Pt c/o increased SOB and cough for the past few months. His cough is prod with green sputum.  He is SOB with minimal exertion and also when he lies down. He is using his proair and albuterol neb each 4 x daily on average.   doe =  MMRC3 = can't walk 100 yards even at a slow pace at a flat grade s stopping due to sob, still walks to mb most days stops 4-5 x on way back  Cough  First thing am / green and thick x up to 15 min to clear it  Sleep R side down 2 pillows/ bed flat  SABA: way over using at baseline  rec Plan A = Automatic =  Continue Trelogy one click each am - take 2 long drags and hold 5 seconds each  Plan B = Backup Only use your albuterol (Proair)   Work on inhaler technique  Plan C = Crisis - only use your albuterol nebulizer if you first try Plan B and it fails to help > ok to use the nebulizer up to  every 4 hours but if start needing it regularly call for immediate appointment Protonix (pantoprazole ) should  Be Take 30- 60 min before your first and last meals of the day  GERD diet   Augmentin 875 mg take one pill twice daily  X 10 days - take at breakfast and supper with large glass of water.  It would help reduce the usual side effects (diarrhea and yeast infections) if you ate cultured yogurt at lunch.  For cough > mucinex or mucinex dm up to 1200 mg every 12 hours as needed and use the flutter valve as much as you can  Depomedrol 120 mg IM today     04/30/18  ONO on RA was pos for desat (done by [facility] 05/21/18) Per MW- start 2lpm o2 with sleep   06/10/2018  f/u ov/[name] re: copd GOLD II/ cb features/ poor insight into how/ when to use hfa vs neb - did not bring neb med but did bring all others and miant on  trelegy  Chief Complaint  Patient presents with  . Follow-up    Breathing has improved some. He is using his albuterol inhaler and neb both 2 x daily.   Dyspnea:  MMRC 3  Cough: variable denies worse in am Sleeping: fine on side on 2lpm/ did not use bed blocks as rec  SABA use: as above, still misunderstanding how to use hfa vs neb but  02: 2lpm hs only  rec  Protonix is supposed to be Take 30- 60 min before your first and last meals of the day  For cough > mucinex or mucinex dm up to 1200 mg every 12 hours as needed and use the flutter valve as much as you can    NP rec 07/04/18  Mucinex twice a day with full glass of water Use flutter valve 3-4 times a day Albuterol nebulizer every 4-6 hours as needed for shortness of breath or wheezing Continue Trelegy 1 puff daily  CXR and labs today Please qualify for POC   06/05/2019  f/u ov/[name] re:  Copd II / maint on trelegy  Chief Complaint  Patient presents with  . Follow-up    pt states he is stable, denies any increased sob, CP, cough, mucus production.    Dyspnea:  Shopping at [employer] at baseline  Cough:  no   Sleeping: on back 2 pillows or couch  SABA use: still using lots of saba/ poor insight how/ when to use  02: 4lpm hs,  None at rest,  4lpm when walking  rec Plan A = Automatic = Always=    Trelegy once a daily  Plan B = Backup (to supplement plan A, not to replace it) Only use your albuterol inhaler as a rescue medication   Plan C = Crisis (instead of Plan B but only if Plan B stops working) - only use your albuterol nebulizer if you first try Plan B and it fails to help > ok to use the nebulizer up to every 4 hours but if start needing it regularly call for immediate appointment      12/13/2019  preop consult /Datra Clary re: GOLD  II / maint on trelegy /  Lots of saba and confused with maint vs prns For hernia surgery in [place] by Tally Due but could not recall his or his pcp's name  Chief Complaint  Patient presents with  . Follow-up    Breathing is overall doing well. He needs pulmonary clearance for hernia surgery. He is using his albuterol inhaler and neb both about 2 x per day.   Dyspnea:  MMRC2 = can't walk a nl pace on a flat grade s sob but does fine slow and flat shopping at [employer] or [employer] with cane  Cough: not much lately Sleeping: flat bed, 2 pillows / no nocturnal resp cc's SABA use: denies pre challenging / using a lot of saba more so since stopped trelegy when "ran out'  02: just 2lpm hs only  rec Plan A = Automatic = Always=   Trelegy 063 one click each am especially for the week before surgery  Plan B = Backup (to supplement plan A, not to replace it) Only use your albuterol inhaler as a rescue medication Plan C = Crisis (instead of Plan B but only if Plan B stops working) - only use your albuterol nebulizer if you first try Plan B and it fails to help > ok to use the nebulizer up to every 4 hours but if start needing it regularly call for immediate appointment    07/05/2020 acute ov/Decklyn Hyder re:  No chief complaint on file.    Dyspnea:  *** Cough:  *** Sleeping: *** SABA use: *** 02: ***   No obvious day to day or daytime variability or assoc excess/ purulent sputum or mucus plugs or hemoptysis or cp or chest tightness, subjective wheeze or overt sinus or hb symptoms.   *** without nocturnal  or early am exacerbation  of respiratory  c/o's or need for noct saba. Also denies any obvious fluctuation of symptoms with weather or environmental changes or other aggravating or alleviating factors except as outlined above   No unusual exposure hx or h/o childhood pna/ asthma or knowledge of premature birth.  Current Allergies, Complete Past Medical History, Past Surgical History, Family History, and Social History were reviewed in [employer] record.  ROS  The following are not active complaints unless bolded Hoarseness, sore throat, dysphagia, dental problems, itching, sneezing,  nasal congestion or discharge of excess mucus or purulent secretions, ear ache,   fever, chills, sweats, unintended wt loss or wt gain, classically pleuritic or exertional cp,  orthopnea pnd or arm/hand swelling  or leg swelling, presyncope, palpitations, abdominal pain, anorexia, nausea, vomiting, diarrhea  or change in bowel habits or change in bladder  habits, change in stools or change in urine, dysuria, hematuria,  rash, arthralgias, visual complaints, headache, numbness, weakness or ataxia or problems with walking or coordination,  change in mood or  memory.        No outpatient medications have been marked as taking for the 07/05/20 encounter (Appointment) with Tanda Rockers, MD.                       Objective:   Physical Exam    07/05/2020        *** 12/13/2019      209 06/05/2019      198  11/09/2018       213  07/11/2018       205 06/10/2018     214  05/13/2018     211 10/30/2016         205   09/11/16 210 lb 3.2 oz (95.3 kg)  07/31/16 206 lb 9.6 oz (93.7 kg)  03/19/16 215 lb (97.5 kg)     Vital signs reviewed  07/05/2020  -  Note at rest 02 sats  ***% on ***   General appearance:    ***     LUNGS: no acc muscle use,  Mod barrel*** large ventral hernia/ poor excursion***    I personally reviewed   radiology impression as follows:  CXR:   06/25/20 PA and lateral Improved but persistent left basilar opacity from exam 2 months ago,  may represent persistent or recurrent pneumonia. Aspiration is  considered. Previous right basilar opacities have improved.        Assessment & Plan:

## 2020-07-05 NOTE — Patient Instructions (Signed)
Can try Breztri two puffs in the morning and two puffs in the evening; will give you 2 samples to try  Will have [facility] check on your home oxygen set up  Follow up in 4 weeks with Dr. Melvyn Novas

## 2020-07-05 NOTE — Progress Notes (Signed)
Chief Complaint: Chief Complaint  Patient presents with  . Acute Visit    Shortness of breath with activity started last week, prednisone started last week and has one more for tomorrow left    Description:  80 yo male former smoker with COPD and chronic hypoxic respiratory failure.  History: He was seen in the ER on 06/25/20.  He was started on doxycycline and decadron for COPD exacerbation.  He was having cough, chest congestion, and more shortness of breath.  Feels some improvement.  Somebody told him at some point to stop using trelegy because he has heart disease.  He isn't sure who this was.  He isn't sure that trelegy helped much.    He uses 3 to 5 liters oxygen.  His oxygen concentrator at home isn't working properly.  It cuts off when he turns it up to 5 liters.  He has been running it at 4.5 liters.  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  Past surgical history: He  has a past surgical history that includes Colostomy; Nose surgery; Back surgery; Hernia repair; Knee surgery; cardiac stents; and Colostomy closure.  Social history: His  reports that he quit smoking about 29 years ago. His smoking use included cigarettes. He started smoking about 66 years ago. He has a 37.00 pack-year smoking history. He quit smokeless tobacco use about 37 years ago. He reports current alcohol use. He reports that he does not use drugs.   Allergies as of 07/05/2020      Reactions   Aspirin Hives, Itching   Fish Allergy Nausea And Vomiting   Latex Other (See Comments)   Tears Skin    Onion Nausea And Vomiting   Other Other (See  Comments)   Just doesn't like [place]    Levaquin [levofloxacin In D5w] Rash   Neosporin [neomycin-bacitracin Zn-polymyx] Rash      Medication List       Accurate as of July 05, 2020 10:52 AM. If you have any questions, ask your nurse or doctor.        STOP taking these medications   Trelegy Ellipta 100-62.5-25 MCG/INH Aepb Generic drug: Fluticasone-Umeclidin-Vilant Stopped by: Chesley Mires, MD     TAKE these medications   albuterol 108 (90 Base) MCG/ACT inhaler Commonly known as: ProAir HFA Inhale 1 puff into the lungs every 4 (four) hours as needed for wheezing or shortness of breath.   albuterol (2.5 MG/3ML) 0.083% nebulizer solution Commonly known as: PROVENTIL INHALE CONTENTS OF 1 VIAL IN NEBULIZER EVERY 6 HOURS AS NEEDED FOR WHEEZING OR SHORTNESS OF BREATH.   Breztri Aerosphere 160-9-4.8 MCG/ACT Aero Generic drug: Budeson-Glycopyrrol-Formoterol Inhale 2 puffs into the lungs in the morning and at bedtime. Started by: Chesley Mires, MD   clopidogrel 75 MG tablet Commonly known as: PLAVIX Take 1 tablet (75 mg total) by mouth daily.   diltiazem 30 MG tablet Commonly known as: CARDIZEM Take 1 tablet (30 mg total) by mouth 2 (two) times daily.   fentaNYL 100 MCG/HR Commonly known as: DURAGESIC Apply one patch to skin every 3 days if tolerated   fluticasone 50 MCG/ACT nasal spray Commonly known as: FLONASE Place 2 sprays into both nostrils daily.   Flutter Devi Use as directed  furosemide 20 MG tablet Commonly known as: LASIX Take 20 mg by mouth daily.   gabapentin 300 MG capsule Commonly known as: NEURONTIN Take 1 capsule by mouth 3 (three) times daily.   ipratropium 0.03 % nasal spray Commonly known as: ATROVENT Place 2 sprays into both nostrils every 12 (twelve) hours.   linagliptin 5 MG Tabs tablet Commonly known as: TRADJENTA Take 5 mg by mouth daily.   metFORMIN 500 MG tablet Commonly known as: GLUCOPHAGE Take 500 mg by mouth 2 (two) times  daily with a meal.   nitroGLYCERIN 0.4 MG SL tablet Commonly known as: NITROSTAT DISSOLVE ONE TABLET UNDER TONGUE EVERY 5 MINUTES UP TO 3 DOSES AS NEEDED FOR CHEST PAIN   oxyCODONE-acetaminophen 10-325 MG tablet Commonly known as: PERCOCET Limit one tablet by mouth per day or 2-4 times per day for breakthrough pain while wearing fentanyl patch if tolerated   OXYGEN 2 lpm with sleep   pantoprazole 40 MG tablet Commonly known as: PROTONIX Take 1 tablet (40 mg total) by mouth 2 (two) times daily.   potassium chloride SA 20 MEQ tablet Commonly known as: KLOR-CON Take 20 mEq by mouth daily.   pravastatin 40 MG tablet Commonly known as: PRAVACHOL Take 1 tablet by mouth daily.   predniSONE 10 MG tablet Commonly known as: DELTASONE 4 x 2 days, 2 x 2 days, 1 x 2 days, then stop   tamsulosin 0.4 MG Caps capsule Commonly known as: FLOMAX Take 0.4 mg by mouth daily.   traZODone 100 MG tablet Commonly known as: DESYREL TAKE ONE TABLET BY MOUTH AT BEDTIME AS NEEDED FOR SLEEP   trospium 20 MG tablet Commonly known as: SANCTURA Take 20 mg by mouth 2 (two) times daily.       Vital signs: BP 132/74 (BP Location: Left Arm, Cuff Size: Normal)   Pulse (!) 127   Temp 99.4 F (37.4 C) (Temporal)   Ht 5\' 7"  (1.702 m)   Wt 200 lb 3.2 oz (90.8 kg)   SpO2 95% Comment: 5L O2  BMI 31.36 kg/m   Physical exam:  Appearance - wearing oxygen  ENMT - no sinus tenderness, no oral exudate, no LAN, Mallampati 3 airway, no stridor  Respiratory - decreased breath sounds bilaterally, no wheezing or rales  CV - s1s2 regular rate and rhythm, no murmurs  Ext - no clubbing, no edema  Skin - no rashes  Psych - normal mood and affect  Assessment/plan:  COPD mixed type. - slowly improving from recent exacerbation - finish steroids from ER - uncertain who told him to stop trelegy and why he was told this; he is reluctant to resume - will have him try breztri; samples given  Chronic  respiratory failure with hypoxia. - goal SpO2 > 90% - will have [facility] check his home oxygen set up   Patient Instructions  Can try Breztri two puffs in the morning and two puffs in the evening; will give you 2 samples to try  Will have [facility] check on your home oxygen set up  Follow up in 4 weeks with Dr. Melvyn Novas  Signature: Chesley Mires, MD [place] Pager - [phone] 07/05/2020, 10:52 AM

## 2020-07-25 ENCOUNTER — Encounter: Payer: Self-pay | Admitting: Internal Medicine

## 2020-07-25 ENCOUNTER — Other Ambulatory Visit: Payer: Self-pay

## 2020-07-25 ENCOUNTER — Ambulatory Visit (INDEPENDENT_AMBULATORY_CARE_PROVIDER_SITE_OTHER): Payer: Medicare Other | Admitting: Internal Medicine

## 2020-07-25 DIAGNOSIS — J449 Chronic obstructive pulmonary disease, unspecified: Secondary | ICD-10-CM

## 2020-07-25 DIAGNOSIS — J9611 Chronic respiratory failure with hypoxia: Secondary | ICD-10-CM

## 2020-07-25 NOTE — Patient Instructions (Addendum)
No change in medications - there are no restrictions for pain medications prescibed by your pain doctor  needed based on your breathing which is doing better  Make sure you check your oxygen saturation  at your highest level of activity  to be sure it stays over 90% and adjust  02 flow upward to maintain this level if needed but remember to turn it back to previous settings when you stop (to conserve your supply).   Please schedule a follow up visit in 6  months but call sooner if needed

## 2020-07-25 NOTE — Progress Notes (Signed)
Subjective:   Patient ID: Herbert May, male    DOB: [birth year],   MRN: [id number]    Brief patient profile:  43 yowm MM/quit smoking in 1992 with breathing problems then that worsened over the years esp since 2011 lived in [place] and moved to [place] in  2013 on 02 since early 2016 at hs only with GOLD II criteria 11/09/16    History of Present Illness  01/24/2015 1st [facility] office visit/ Herbert May  On flovent/ spiriva  Chief Complaint  Patient presents with   Pulmonary Consult    Self referral. Pt states has had SOB "all of my life"- worse x 3 months. He states he is SOB all of the time, with or without exertion. He also c/o cough- prod with yellow sputum.    gradually worse doe x 4-5 years but can still do [employer] slowly but c/o sense of sob at rest assoc with congested cough and worse symptoms walking in hot humid conditions not much better with saba but doesn't really understand how to use them  rec Ok to use the combination of albuterol/ipatriprium up to 4 x daily  Only use your albuterol as a rescue medication pantoprazole 40  Mg Take 30- 60 min before your first and last meals of the day  GERD diet   07/31/16 NP  Pt presents for an acute office visit. Complains of persistent cough, congestion , dyspnea and wheezing . Worse for last 3-4 weeks .  Recently admitted 2 weeks ago at  [facility] for COPD flare , tx w/ abx and steroids . No discharge summary at todays visit. Request sent to hospital . Since got out of hospital he is not feeling much better. Still has cough/congestion and wheezing . Seen by PCP yesterday and started Doxycycline . Coughing up thick green mucus.  No chest pain or hemoptysis. Appetitie is down. No n/v/d.  Last seen in pulmonary clinic in 2016.  Previously on Symbicort and Spiriva in past but insurance did not cover .  On Duoneb Four times a day  . Insurance covers this well.  rec Finish Doxycycline .  Mucinex DM Twice daily  As needed   Cough/congestion  Continue on Duoneb Four times a day  .  Begin Oxygen 2l/m with activity  Order to DME for portable concentrator.  Begin BREO 1 puff daily .     10/30/2016  f/u ov/Herbert May re:  GOLD II copd / breo maint / overuse of saba noted / 02 2lpm  hs and prn with ambulation  Chief Complaint  Patient presents with   Follow-up    PFT's done today. Breathing is unchanged. He is using proair 3-4 x per day and albuterol neb 4 x daily.   doe = MMRC2 usually s 55 rec Plan A = Automatic = try off the BREO  And replace it with Trelogy one click each am - take 2 long drags and hold 5 seconds each  Plan B = Backup Only use your albuterol (Proair)  as a rescue medication  Work on inhaler technique:    Plan C = Crisis - only use your albuterol/ipatropium nebulizer if you first try Plan B    ER in [place]  And Nov 2019 for copd / fast heart rate > last in  ER 05/04/18 rec pred x 5 day    05/13/2018 acute extended ov/Herbert May re: re-establish for COPD GOLD II / sob/cough some better p last ER trip / now maint on Trelegy  Chief Complaint  Patient presents with   Acute Visit    Pt c/o increased SOB and cough for the past few months. His cough is prod with green sputum.  He is SOB with minimal exertion and also when he lies down. He is using his proair and albuterol neb each 4 x daily on average.   doe =  MMRC3 = can't walk 100 yards even at a slow pace at a flat grade s stopping due to sob, still walks to mb most days stops 4-5 x on way back  Cough  First thing am / green and thick x up to 15 min to clear it  Sleep R side down 2 pillows/ bed flat  SABA: way over using at baseline  rec Plan A = Automatic =  Continue Trelogy one click each am - take 2 long drags and hold 5 seconds each  Plan B = Backup Only use your albuterol (Proair)   Work on inhaler technique  Plan C = Crisis - only use your albuterol nebulizer if you first try Plan B and it fails to help > ok to use the nebulizer up to  every 4 hours but if start needing it regularly call for immediate appointment Protonix (pantoprazole ) should  Be Take 30- 60 min before your first and last meals of the day  GERD diet   Augmentin 875 mg take one pill twice daily  X 10 days - take at breakfast and supper with large glass of water.  It would help reduce the usual side effects (diarrhea and yeast infections) if you ate cultured yogurt at lunch.  For cough > mucinex or mucinex dm up to 1200 mg every 12 hours as needed and use the flutter valve as much as you can  Depomedrol 120 mg IM today     04/30/18  ONO on RA was pos for desat (done by [facility] 05/21/18) Per MW- start 2lpm o2 with sleep   06/10/2018  f/u ov/Herbert May re: copd GOLD II/ cb features/ poor insight into how/ when to use hfa vs neb - did not bring neb med but did bring all others and miant on  trelegy  Chief Complaint  Patient presents with   Follow-up    Breathing has improved some. He is using his albuterol inhaler and neb both 2 x daily.   Dyspnea:  MMRC 3  Cough: variable denies worse in am Sleeping: fine on side on 2lpm/ did not use bed blocks as rec  SABA use: as above, still misunderstanding how to use hfa vs neb but  02: 2lpm hs only  rec  Protonix is supposed to be Take 30- 60 min before your first and last meals of the day  For cough > mucinex or mucinex dm up to 1200 mg every 12 hours as needed and use the flutter valve as much as you can    NP rec 07/04/18  Mucinex twice a day with full glass of water Use flutter valve 3-4 times a day Albuterol nebulizer every 4-6 hours as needed for shortness of breath or wheezing Continue Trelegy 1 puff daily  CXR and labs today Please qualify for POC      07/11/2018  f/u ov/Herbert May re:   COPD gold II/ maint trelegy -  Qualified for amb 02 and chronically on 2lpm hs but has not received portable system yet Chief Complaint  Patient presents with   Follow-up    Breathing is some better since the  last visit. He  is using his albuterol inhaler and neb both several times per day.   Dyspnea:   able to do [employer] shopping easier than before, thinks trelegy is why even though not using 02 yet as rec  Cough: minimal mucoid in am's/ using flutter Sleeping: flat bed, on side/ 2 pillows  SABA use: as above 02: hs 2lpm, does not have portable system  rec We will see if we can troubleshoot why you don't have a portable system but only needs to be used with walking outside of house  No change in medications    11/09/2018  f/u ov/Herbert May re:  Copd II/ maint trelegy/ 02 2lpm only Chief Complaint  Patient presents with   Follow-up    Breathing is about the same. He is using his proair and neb with albuterol both 2-3 x per day.  Dyspnea:  G[place] to shop at [place]/ gets dropped off at door / not using 02 - slow walk to mb and back hilly Cough: no Sleeping: on side / bed is flat , 2 pillows  SABA use: up to twice daily when "overdoes it"  02: 2lpm at hs /not using during the day  rec Titrate sats    06/05/2019  f/u ov/Herbert May re:  Copd II / maint on trelegy  Chief Complaint  Patient presents with   Follow-up    pt states he is stable, denies any increased sob, CP, cough, mucus production.    Dyspnea:  Shopping at [employer] at baseline  Cough: no  Sleeping: on back 2 pillows or couch  SABA use: still using lots of saba/ poor insight how/ when to use  02: 4lpm hs,  None at rest,  4lpm when walking  rec Plan A = Automatic = Always=    Trelegy once a daily  Plan B = Backup (to supplement plan A, not to replace it) Only use your albuterol inhaler as a rescue medication   Plan C = Crisis (instead of Plan B but only if Plan B stops working) - only use your albuterol nebulizer if you first try Plan B and it fails to help > ok to use the nebulizer up to every 4 hours but if start needing it regularly call for immediate appointment      12/13/2019  preop consult /Herbert May re: GOLD  II / maint on trelegy /  Lots of saba  and confused with maint vs prns For hernia surgery in [place] by Tally Due but could not recall his or his pcp's name  Chief Complaint  Patient presents with   Follow-up    Breathing is overall doing well. He needs pulmonary clearance for hernia surgery. He is using his albuterol inhaler and neb both about 2 x per day.   Dyspnea:  MMRC2 = can't walk a nl pace on a flat grade s sob but does fine slow and flat shopping at [employer] or [employer] with cane  Cough: not much lately Sleeping: flat bed, 2 pillows / no nocturnal resp cc's SABA use: denies pre challenging / using a lot of saba more so since stopped trelegy when "ran out'  02: just 2lpm hs only rec Can try Breztri two puffs in the morning and two puffs in the evening; will give you 2 samples to try Will have [facility] check on your home oxygen set up Follow up in 4 weeks with Dr. Melvyn Novas    07/25/2020  f/u ov/Herbert May re: re GOLD II/ breztri  better than prior rx  Chief Complaint  Patient presents with   Followup     Breathing has improved back to baseline since last visit. He has occ cough with clear sputum. He is using the albuterol inhaler 3-4 x per wk and neb with albuterol 2 x per day.   Dyspnea:  Can do [employer] or [employer]/ walks across parking lot not HC parking  Cough: min mucoid  Sleeping: recliner 45 degrees, wife smokes in bedroom so he stays out  SABA use: as above  02: 5lpm 24/7    No obvious day to day or daytime variability or assoc excess/ purulent sputum or mucus plugs or hemoptysis or cp or chest tightness, subjective wheeze or overt sinus or hb symptoms.   Sleeping as above without nocturnal  or early am exacerbation  of respiratory  c/o's or need for noct saba. Also denies any obvious fluctuation of symptoms with weather or environmental changes or other aggravating or alleviating factors except as outlined above   No unusual exposure hx or h/o childhood pna/ asthma or knowledge of premature  birth.  Current Allergies, Complete Past Medical History, Past Surgical History, Family History, and Social History were reviewed in [employer] record.  ROS  The following are not active complaints unless bolded Hoarseness, sore throat, dysphagia, dental problems, itching, sneezing,  nasal congestion or discharge of excess mucus or purulent secretions, ear ache,   fever, chills, sweats, unintended wt loss or wt gain, classically pleuritic or exertional cp,  orthopnea pnd or arm/hand swelling  or leg swelling, presyncope, palpitations, abdominal pain, anorexia, nausea, vomiting, diarrhea  or change in bowel habits or change in bladder habits, change in stools or change in urine, dysuria, hematuria,  rash, arthralgias, visual complaints, headache, numbness, weakness or ataxia or problems with walking or coordination,  change in mood or  memory.        Current Meds  Medication Sig   albuterol (PROAIR HFA) 108 (90 BASE) MCG/ACT inhaler Inhale 1 puff into the lungs every 4 (four) hours as needed for wheezing or shortness of breath.   albuterol (PROVENTIL) (2.5 MG/3ML) 0.083% nebulizer solution INHALE CONTENTS OF 1 VIAL IN NEBULIZER EVERY 6 HOURS AS NEEDED FOR WHEEZING OR SHORTNESS OF BREATH.   Budeson-Glycopyrrol-Formoterol (BREZTRI AEROSPHERE) 160-9-4.8 MCG/ACT AERO Inhale 2 puffs into the lungs in the morning and at bedtime.   clopidogrel (PLAVIX) 75 MG tablet Take 1 tablet (75 mg total) by mouth daily.   diltiazem (CARDIZEM) 30 MG tablet Take 1 tablet (30 mg total) by mouth 2 (two) times daily.   fentaNYL (DURAGESIC - DOSED MCG/HR) 100 MCG/HR Apply one patch to skin every 3 days if tolerated   fluticasone (FLONASE) 50 MCG/ACT nasal spray Place 2 sprays into both nostrils daily.   furosemide (LASIX) 20 MG tablet Take 20 mg by mouth daily.   gabapentin (NEURONTIN) 300 MG capsule Take 1 capsule by mouth 3 (three) times daily.   ipratropium (ATROVENT) 0.03 % nasal spray  Place 2 sprays into both nostrils every 12 (twelve) hours.   linagliptin (TRADJENTA) 5 MG TABS tablet Take 5 mg by mouth daily.   metFORMIN (GLUCOPHAGE) 500 MG tablet Take 500 mg by mouth 2 (two) times daily with a meal.   nitroGLYCERIN (NITROSTAT) 0.4 MG SL tablet DISSOLVE ONE TABLET UNDER TONGUE EVERY 5 MINUTES UP TO 3 DOSES AS NEEDED FOR CHEST PAIN   oxyCODONE-acetaminophen (PERCOCET) 10-325 MG tablet Limit one tablet by mouth per day or 2-4  times per day for breakthrough pain while wearing fentanyl patch if tolerated   OXYGEN 2 lpm with sleep   pantoprazole (PROTONIX) 40 MG tablet Take 1 tablet (40 mg total) by mouth 2 (two) times daily.   potassium chloride SA (K-DUR,KLOR-CON) 20 MEQ tablet Take 20 mEq by mouth daily.   pravastatin (PRAVACHOL) 40 MG tablet Take 1 tablet by mouth daily.   Respiratory Therapy Supplies (FLUTTER) DEVI Use as directed   tamsulosin (FLOMAX) 0.4 MG CAPS capsule Take 0.4 mg by mouth daily.   traZODone (DESYREL) 100 MG tablet TAKE ONE TABLET BY MOUTH AT BEDTIME AS NEEDED FOR SLEEP   trospium (SANCTURA) 20 MG tablet Take 20 mg by mouth 2 (two) times daily.                        Objective:   Physical Exam    07/25/2020      208 12/13/2019      209 06/05/2019      198  11/09/2018       213  07/11/2018       205 06/10/2018     214  05/13/2018     211 10/30/2016         205   09/11/16 210 lb 3.2 oz (95.3 kg)  07/31/16 206 lb 9.6 oz (93.7 kg)  03/19/16 215 lb (97.5 kg)      Vital signs reviewed  07/25/2020  - Note at rest 02 sats  97% on RA! (confirmed)    General appearance:    amb elderly obese wm some rattling    HEENT : pt wearing mask not removed for exam due to covid -19 concerns.    NECK :  without JVD/Nodes/TM/ nl carotid upstrokes bilaterally   LUNGS: no acc muscle use,  Mod barrel  contour chest wall with bilateral  Distant bs s audible wheeze and  without cough on insp or exp maneuvers and mod  Hyperresonant  to  percussion  bilaterally     CV:  RRR  no s3 or murmur or increase in P2, and 1+ pitting both LE's despite elastic hose   ABD:  soft and nontender with pos mid insp Hoover's  in the supine position. No bruits or organomegaly appreciated, bowel sounds nl  MS:     ext warm without deformities, calf tenderness, cyanosis or clubbing No obvious joint restrictions   SKIN: warm and dry without lesions    NEURO:  alert, approp, nl sensorium with  no motor or cerebellar deficits apparent.                 Assessment & Plan:

## 2020-07-26 ENCOUNTER — Encounter: Payer: Self-pay | Admitting: Internal Medicine

## 2020-07-26 NOTE — Assessment & Plan Note (Signed)
ono RA ordered  01/29/15  < 89% @ 47 m 36sec > rec 2lpm at hs  Added prn 02 with activity 07/31/16 with goal of keeping sats > 90% ONO 05/21/18 desat RA x 86 min > rec 05/30/18:  resume 2lpm - 06/10/2018   Walked RA x one lap = 210 ft - stopped due to  Backpain, sob s desat slow to avg pace  07/04/2018-   Amb O2 sat 87% RA, needs 3-4L pulsed to keep O2 >>90% - 11/09/2018   Walked RA x one lap =  approx 250 ft - slow pace/ stopped due to  Sob with sats fine and really not sob so much as back/ leg pains - 05/22/2019 notified on 2lpm 24/ 7 p ? covid 19 pna [facility]  - as of 12/13/2019 just using 2lpm hs   As of 07/25/2020 sats ok RA so just needs 2lpm hs and titrate daytime to keep > 90%   No evidence of hypercabia/ C02 narcosis additive to pain meds so ok to use per pain clinic s specific restrictions (titrate to effect as long as AMS not an issue from my perspective)           Each maintenance medication was reviewed in detail including emphasizing most importantly the difference between maintenance and prns and under what circumstances the prns are to be triggered using an action plan format where appropriate.  Total time for H and P, chart review, counseling, reviewing hfa/02 device(s) and generating customized AVS unique to this office visit / same day charting > 30 min

## 2020-07-26 NOTE — Assessment & Plan Note (Signed)
Quit smoking 1992  - MMRC 2 at first pulmonary ov 01/24/15  - 01/24/2015  Walked RA x 3 laps @ 185 ft each stopped due to  End of study, slow pace, no sob or desat   - 01/24/2015 p extensive coaching HFA effectiveness =   75% > try symbicort 160 2bid > pt d/c'd on his own  - PFT's  02/07/2015  FEV1 1.44 (52 % ) ratio 52  p no % improvement from saba with DLCO  58 % corrects to 85 % for alv volume   - 09/11/2016  Try BREO - PFT's  10/30/2016  FEV1 1.63 (61 % ) ratio 56  p no % improvement from saba p duob around 4 h prior to study with DLCO  70/69 % corrects to 90 % for alv volume    - 10/30/2016 trial of Trelogy as using way too much saba at baseline > still overusing saba as of 05/13/2018  - 05/13/2018 added flutter  06/10/2018  Alpha one AT screening MM/ level 148  07/04/2018-    Amb O2 sat 87% RA, needs 3-4L pulsed to keep O2 >>90% - 07/25/2020  After extensive coaching inhaler device,  effectiveness = 75% but better on breztri and sats ok at rest RA    Group D in terms of symptom/risk and laba/lama/ICS  therefore appropriate rx at this point >>>  Ok to continue to use breztri 2bid and approp saba  Re saba: I spent extra time with pt today reviewing appropriate use of albuterol for prn use on exertion with the following points: 1) saba is for relief of sob that does not improve by walking a slower pace or resting but rather if the pt does not improve after trying this first. 2) If the pt is convinced, as many are, that saba helps recover from activity faster then it's easy to tell if this is the case by re-challenging : ie stop, take the inhaler, then p 5 minutes try the exact same activity (intensity of workload) that just caused the symptoms and see if they are substantially diminished or not after saba 3) if there is an activity that reproducibly causes the symptoms, try the saba 15 min before the activity on alternate days   If in fact the saba really does help, then fine to continue to use it prn  but advised may need to look closer at the maintenance regimen being used to achieve better control of airways disease with exertion.

## 2020-07-29 ENCOUNTER — Telehealth: Payer: Self-pay | Admitting: Internal Medicine

## 2020-07-29 NOTE — Telephone Encounter (Signed)
Called and spoke with Patient.  Patient stated letter needed to be faxed to Dr Peggye Form, about drug contraindications.  Letter printed and faxed to Dr Peggye Form, [phone], confirmation received. Nothing further at this time.

## 2020-07-31 ENCOUNTER — Telehealth: Payer: Self-pay | Admitting: Internal Medicine

## 2020-07-31 MED ORDER — BREZTRI AEROSPHERE 160-9-4.8 MCG/ACT IN AERO: 2.0000 | INHALATION_SPRAY | Freq: Two times a day (BID) | RESPIRATORY_TRACT | 11 refills | Status: DC

## 2020-07-31 NOTE — Telephone Encounter (Signed)
Called and spoke with patient to confirm which medication he wanted sent in and which pharmacy. He confirmed both and stated that he is doing good with the [facility]. RX has been sent. Nothing further needed at this time.

## 2020-09-02 ENCOUNTER — Telehealth: Payer: Self-pay | Admitting: Internal Medicine

## 2020-09-02 NOTE — Telephone Encounter (Signed)
Called and spoke with pt letting him know the info stated by MW and he verbalized understanding. Pt stated he would have to talk with his wife about getting appt scheduled and he said he would call back once he spoke with her to get appt scheduled.

## 2020-09-02 NOTE — Telephone Encounter (Signed)
Called and spoke with pt who states he has been having increased SOB as well as having the shakes which he thinks is coming from the [place] inhaler he is on.  Pt states the symptoms began about 3-4 days ago.  Due to these symptoms, pt is wanting to know if there is anything else that he might be able to be put on in place of the [place]. Dr. Melvyn Novas, please advise.

## 2020-09-02 NOTE — Telephone Encounter (Signed)
He has nebulized albuterol he can use up to every 4 hours if doing worse and then come to office next available slot to see me or another provider with all meds in hand to regroup  - nothing else to offer over the phone, to er if get worse on neb awaiting ov

## 2020-09-05 ENCOUNTER — Ambulatory Visit (INDEPENDENT_AMBULATORY_CARE_PROVIDER_SITE_OTHER): Payer: Medicare Other | Admitting: Internal Medicine

## 2020-09-05 ENCOUNTER — Encounter: Payer: Self-pay | Admitting: Internal Medicine

## 2020-09-05 ENCOUNTER — Other Ambulatory Visit: Payer: Self-pay

## 2020-09-05 DIAGNOSIS — J9611 Chronic respiratory failure with hypoxia: Secondary | ICD-10-CM | POA: Diagnosis not present

## 2020-09-05 DIAGNOSIS — J449 Chronic obstructive pulmonary disease, unspecified: Secondary | ICD-10-CM | POA: Diagnosis not present

## 2020-09-05 MED ORDER — IPRATROPIUM BROMIDE 0.02 % IN SOLN: 0.5000 mg | Freq: Four times a day (QID) | RESPIRATORY_TRACT | Status: DC

## 2020-09-05 MED ORDER — PREDNISONE 10 MG PO TABS: ORAL_TABLET | ORAL | 11 refills | Status: DC

## 2020-09-05 NOTE — Assessment & Plan Note (Signed)
ono RA ordered  01/29/15  < 89% @ 62 m 36sec > rec 2lpm at hs  Added prn 02 with activity 07/31/16 with goal of keeping sats > 90% ONO 05/21/18 desat RA x 86 min > rec 05/30/18:  resume 2lpm - 06/10/2018   Walked RA x one lap = 210 ft - stopped due to  Backpain, sob s desat slow to avg pace  07/04/2018-   Amb O2 sat 87% RA, needs 3-4L pulsed to keep O2 >>90% - 11/09/2018   Walked RA x one lap =  approx 250 ft - slow pace/ stopped due to  Sob with sats fine and really not sob so much as back/ leg pains - 05/22/2019 notified on 2lpm 24/ 7 p ? covid 19 pna [facility]  - as of 12/13/2019 just using 2lpm hs  - 09/05/2020 sats 96% at rest > rec 4lpm hs and titrate to keep > 90% walking   Have asked him to seek another pulmonary provider as consistently inconsistent following instructions while arguing that he is.          Each maintenance medication was reviewed in detail including emphasizing most importantly the difference between maintenance and prns and under what circumstances the prns are to be triggered using an action plan format where appropriate.  Total time for H and P, chart review, counseling, reviewing hfa/neb device(s) and generating customized AVS unique to this office visit / same day charting  = 45 min

## 2020-09-05 NOTE — Assessment & Plan Note (Addendum)
Quit smoking 1992  - MMRC 2 at first pulmonary ov 01/24/15  - 01/24/2015  Walked RA x 3 laps @ 185 ft each stopped due to  End of study, slow pace, no sob or desat   - 01/24/2015 p extensive coaching HFA effectiveness =   75% > try symbicort 160 2bid > pt d/c'd on his own  - PFT's  02/07/2015  FEV1 1.44 (52 % ) ratio 52  p no % improvement from saba with DLCO  58 % corrects to 85 % for alv volume   - 09/11/2016  Try BREO - PFT's  10/30/2016  FEV1 1.63 (61 % ) ratio 56  p no % improvement from saba p duob around 4 h prior to study with DLCO  70/69 % corrects to 90 % for alv volume    - 10/30/2016 trial of Trelogy as using way too much saba at baseline > still overusing saba as of 05/13/2018  - 05/13/2018 added flutter  06/10/2018  Alpha one AT screening MM/ level 148  07/04/2018-    Amb O2 sat 87% RA, needs 3-4L pulsed to keep O2 >>90% - 07/25/2020  After extensive coaching inhaler device,  effectiveness = 75% but better on breztri and sats ok at rest RA  - 09/02/20 d/c'd breztri "no good and made me shake" but never used saba at approp levels even when reported responding to breztri so rec duoneb qid and saba prn   DDX of  difficult airways management almost all start with A and  include Adherence, Ace Inhibitors, Acid Reflux, Active Sinus Disease, Alpha 1 Antitripsin deficiency, Anxiety masquerading as Airways dz,  ABPA,  Allergy(esp in young), Aspiration (esp in elderly), Adverse effects of meds,  Active smoking or vaping, A bunch of PE's (a small clot burden can't cause this syndrome unless there is already severe underlying pulm or vascular dz with poor reserve) plus two Bs  = Bronchiectasis and Beta blocker use..and one C= CHF  Adherence is always the initial "prime suspect" and is a multilayered concern that requires a "trust but verify" approach in every patient - starting with knowing how to use medications, especially inhalers, correctly, keeping up with refills and understanding the fundamental  difference between maintenance and prns vs those medications only taken for a very short course and then stopped and not refilled.   He simply never got the message on difference between maint and prns and overused saba at baseline. Would be a good candidate for neb lama/ics/laba but insurance only covers 80% of cost so rec  Change to duoneb qid, saba prn and if not well controlled > cycle prn (as "plan D" )   Prednisone 10 mg take  4 each am x 2 days,   2 each am x 2 days,  1 each am x 2 days and stop

## 2020-09-05 NOTE — Progress Notes (Signed)
Subjective:   Patient ID: Herbert May, male    DOB: 14-Aug-1940,   MRN: [id number]    Brief patient profile:  81 yowm MM/quit smoking in 1992 with breathing problems then that worsened over the years esp since 2011 lived in [place] and moved to [place] in  2013 on 02 since early 2016 at hs only with GOLD II criteria 11/09/16    History of Present Illness  01/24/2015 [facility] office visit/ Herbert May  On flovent/ spiriva  Chief Complaint  Patient presents with  . Pulmonary Consult    Self referral. Pt states has had SOB "all of my life"- worse x 3 months. He states he is SOB all of the time, with or without exertion. He also c/o cough- prod with yellow sputum.    gradually worse doe x 4-5 years but can still do [employer] slowly but c/o sense of sob at rest assoc with congested cough and worse symptoms walking in hot humid conditions not much better with saba but doesn't really understand how to use them  rec Ok to use the combination of albuterol/ipatriprium up to 4 x daily  Only use your albuterol as a rescue medication pantoprazole 40  Mg Take 30- 60 min before your first and last meals of the day  GERD diet   07/31/16 NP  Pt presents for an acute office visit. Complains of persistent cough, congestion , dyspnea and wheezing . Worse for last 3-4 weeks .  Recently admitted 2 weeks ago at  [facility] for COPD flare , tx w/ abx and steroids . No discharge summary at todays visit. Request sent to hospital . Since got out of hospital he is not feeling much better. Still has cough/congestion and wheezing . Seen by PCP yesterday and started Doxycycline . Coughing up thick green mucus.  No chest pain or hemoptysis. Appetitie is down. No n/v/d.  Last seen in pulmonary clinic in 2016.  Previously on Symbicort and Spiriva in past but insurance did not cover .  On Duoneb Four times a day  . Insurance covers this well.  rec Finish Doxycycline .  Mucinex DM Twice daily  As needed   Cough/congestion  Continue on Duoneb Four times a day  .  Begin Oxygen 2l/m with activity  Order to DME for portable concentrator.  Begin BREO 1 puff daily .     10/30/2016  f/u ov/Herbert May re:  GOLD II copd / breo maint / overuse of saba noted / 02 2lpm  hs and prn with ambulation  Chief Complaint  Patient presents with  . Follow-up    PFT's done today. Breathing is unchanged. He is using proair 3-4 x per day and albuterol neb 4 x daily.   doe = MMRC2 usually s 70 rec Plan A = Automatic = try off the BREO  And replace it with Trelogy one click each am - take 2 long drags and hold 5 seconds each  Plan B = Backup Only use your albuterol (Proair)  as a rescue medication  Work on inhaler technique:    Plan C = Crisis - only use your albuterol/ipatropium nebulizer if you first try Plan B    ER in [place]  And Nov 2019 for copd / fast heart rate > last in  ER 05/04/18 rec pred x 5 day    05/13/2018 acute extended ov/Herbert May re: re-establish for COPD GOLD II / sob/cough some better p last ER trip / now maint on Trelegy  Chief Complaint  Patient presents with  . Acute Visit    Pt c/o increased SOB and cough for the past few months. His cough is prod with green sputum.  He is SOB with minimal exertion and also when he lies down. He is using his proair and albuterol neb each 4 x daily on average.   doe =  MMRC3 = can't walk 100 yards even at a slow pace at a flat grade s stopping due to sob, still walks to mb most days stops 4-5 x on way back  Cough  First thing am / green and thick x up to 15 min to clear it  Sleep R side down 2 pillows/ bed flat  SABA: way over using at baseline  rec Plan A = Automatic =  Continue Trelogy one click each am - take 2 long drags and hold 5 seconds each  Plan B = Backup Only use your albuterol (Proair)   Work on inhaler technique  Plan C = Crisis - only use your albuterol nebulizer if you first try Plan B and it fails to help > ok to use the nebulizer up to  every 4 hours but if start needing it regularly call for immediate appointment Protonix (pantoprazole ) should  Be Take 30- 60 min before your first and last meals of the day  GERD diet   Augmentin 875 mg take one pill twice daily  X 10 days - take at breakfast and supper with large glass of water.  It would help reduce the usual side effects (diarrhea and yeast infections) if you ate cultured yogurt at lunch.  For cough > mucinex or mucinex dm up to 1200 mg every 12 hours as needed and use the flutter valve as much as you can  Depomedrol 120 mg IM today     04/30/18  ONO on RA was pos for desat (done by [facility] 05/21/18) Per MW- start 2lpm o2 with sleep   06/10/2018  f/u ov/Herbert May re: copd GOLD II/ cb features/ poor insight into how/ when to use hfa vs neb - did not bring neb med but did bring all others and miant on  trelegy  Chief Complaint  Patient presents with  . Follow-up    Breathing has improved some. He is using his albuterol inhaler and neb both 2 x daily.   Dyspnea:  MMRC 3  Cough: variable denies worse in am Sleeping: fine on side on 2lpm/ did not use bed blocks as rec  SABA use: as above, still misunderstanding how to use hfa vs neb but  02: 2lpm hs only  rec  Protonix is supposed to be Take 30- 60 min before your first and last meals of the day  For cough > mucinex or mucinex dm up to 1200 mg every 12 hours as needed and use the flutter valve as much as you can    NP rec 07/04/18  Mucinex twice a day with full glass of water Use flutter valve 3-4 times a day Albuterol nebulizer every 4-6 hours as needed for shortness of breath or wheezing Continue Trelegy 1 puff daily  CXR and labs today Please qualify for POC      07/11/2018  f/u ov/Herbert May re:   COPD gold II/ maint trelegy -  Qualified for amb 02 and chronically on 2lpm hs but has not received portable system yet Chief Complaint  Patient presents with  . Follow-up    Breathing is some better since the  last visit. He  is using his albuterol inhaler and neb both several times per day.   Dyspnea:   able to do [employer] shopping easier than before, thinks trelegy is why even though not using 02 yet as rec  Cough: minimal mucoid in am's/ using flutter Sleeping: flat bed, on side/ 2 pillows  SABA use: as above 02: hs 2lpm, does not have portable system  rec We will see if we can troubleshoot why you don't have a portable system but only needs to be used with walking outside of house  No change in medications    11/09/2018  f/u ov/Clyde Upshaw re:  Copd II/ maint trelegy/ 02 2lpm only Chief Complaint  Patient presents with  . Follow-up    Breathing is about the same. He is using his proair and neb with albuterol both 2-3 x per day.  Dyspnea:  G[place] to shop at [place]/ gets dropped off at door / not using 02 - slow walk to mb and back hilly Cough: no Sleeping: on side / bed is flat , 2 pillows  SABA use: up to twice daily when "overdoes it"  02: 2lpm at hs /not using during the day  rec Titrate sats    06/05/2019  f/u ov/Wess Baney re:  Copd II / maint on trelegy  Chief Complaint  Patient presents with  . Follow-up    pt states he is stable, denies any increased sob, CP, cough, mucus production.    Dyspnea:  Shopping at [employer] at baseline  Cough: no  Sleeping: on back 2 pillows or couch  SABA use: still using lots of saba/ poor insight how/ when to use  02: 4lpm hs,  None at rest,  4lpm when walking  rec Plan A = Automatic = Always=    Trelegy once a daily  Plan B = Backup (to supplement plan A, not to replace it) Only use your albuterol inhaler as a rescue medication   Plan C = Crisis (instead of Plan B but only if Plan B stops working) - only use your albuterol nebulizer if you first try Plan B and it fails to help > ok to use the nebulizer up to every 4 hours but if start needing it regularly call for immediate appointment      12/13/2019  preop consult /Maxamillian Tienda re: GOLD  II / maint on trelegy /  Lots of saba  and confused with maint vs prns For hernia surgery in [place] by Tally Due but could not recall his or his pcp's name  Chief Complaint  Patient presents with  . Follow-up    Breathing is overall doing well. He needs pulmonary clearance for hernia surgery. He is using his albuterol inhaler and neb both about 2 x per day.   Dyspnea:  MMRC2 = can't walk a nl pace on a flat grade s sob but does fine slow and flat shopping at [employer] or [employer] with cane  Cough: not much lately Sleeping: flat bed, 2 pillows / no nocturnal resp cc's SABA use: denies pre challenging / using a lot of saba more so since stopped trelegy when "ran out'  02: just 2lpm hs only rec Can try Breztri two puffs in the morning and two puffs in the evening; will give you 2 samples to try Will have [facility] check on your home oxygen set up Follow up in 4 weeks with Dr. Melvyn Novas    07/25/2020  f/u ov/Dae Highley re: re GOLD II/ breztri  better than prior rx  Chief Complaint  Patient presents with  . Followup     Breathing has improved back to baseline since last visit. He has occ cough with clear sputum. He is using the albuterol inhaler 3-4 x per wk and neb with albuterol 2 x per day.   Dyspnea:  Can do [employer] or [employer]/ walks across parking lot not HC parking  Cough: min mucoid  Sleeping: recliner 45 degrees, wife smokes in bedroom so he stays out  SABA use: as above  02: 5lpm 24/7 rec No change in medications - there are no restrictions for pain medications prescibed by your pain doctor  needed based on your breathing which is doing better Make sure you check your oxygen saturation  at your highest level of activity  to be sure it stays over 90% and adjust  02 flow upward to maintain this level if needed but remember to turn it back to previous settings when you stop (to conserve your supply).   PC 07/31/20  He confirmed both and stated that he is doing good with the [facility]. RX has been sent  [facility] 09/02/20 cc Breztri not  woring  did not follow recs for rescue rx advised if not convinced breztri helping  He has nebulized albuterol he can use up to every 4 hours if doing worse and then come to office next available slot to see me or another provider with all meds in hand to regroup  - nothing else to offer over the phone, to er if get worse on neb awaiting ov  09/05/2020  f/u ov/[place] office/Trusten Hume re:  Aecopd/ did not bring meds Chief Complaint  Patient presents with  . Follow-up    Pt states breathing was worse while on breztri so he stopped and is using his albuterol inhaler and neb both about every 6 hours. Breathing is overall doing well today.   Dyspnea:  Walks aisles at [employer] when doing better sometimes s desats on RA  Cough: congested / white mucus esp in am/ has flutter valve ? Using  Sleeping: recliner at 45 degrees  SABA use: way too much at baseline/thoroughly confused again  02: 4lpm hs and prn daytime but not understanding basic concepts that the more he walks the  More he should use his 02  Covid status: vax x 3      No obvious day to day or daytime variability or assoc  purulent sputum or mucus plugs or hemoptysis or cp or chest tightness, subjective wheeze or overt sinus or hb symptoms.   Sleeping as above without nocturnal  or early am exacerbation  of respiratory  c/o's or need for noct saba. Also denies any obvious fluctuation of symptoms with weather or environmental changes or other aggravating or alleviating factors except as outlined above   No unusual exposure hx or h/o childhood pna/ asthma or knowledge of premature birth.  Current Allergies, Complete Past Medical History, Past Surgical History, Family History, and Social History were reviewed in [employer] record.  ROS  The following are not active complaints unless bolded Hoarseness, sore throat, dysphagia, dental problems, itching, sneezing,  nasal congestion or discharge of excess mucus or purulent  secretions, ear ache,   fever, chills, sweats, unintended wt loss or wt gain, classically pleuritic or exertional cp,  orthopnea pnd or arm/hand swelling  or leg swelling, presyncope, palpitations, abdominal pain, anorexia, nausea, vomiting, diarrhea  or change in bowel habits or change in  bladder habits, change in stools or change in urine, dysuria, hematuria,  rash, arthralgias, visual complaints, headache, numbness, weakness or ataxia or problems with walking or coordination,  change in mood or  memory.        Current Meds  Medication Sig  . albuterol (PROAIR HFA) 108 (90 BASE) MCG/ACT inhaler Inhale 1 puff into the lungs every 4 (four) hours as needed for wheezing or shortness of breath.  Marland Kitchen albuterol (PROVENTIL) (2.5 MG/3ML) 0.083% nebulizer solution INHALE CONTENTS OF 1 VIAL IN NEBULIZER EVERY 6 HOURS AS NEEDED FOR WHEEZING OR SHORTNESS OF BREATH.  . clopidogrel (PLAVIX) 75 MG tablet Take 1 tablet (75 mg total) by mouth daily.  Marland Kitchen diltiazem (CARDIZEM) 30 MG tablet Take 1 tablet (30 mg total) by mouth 2 (two) times daily.  . fentaNYL (DURAGESIC - DOSED MCG/HR) 100 MCG/HR Apply one patch to skin every 3 days if tolerated  . fluticasone (FLONASE) 50 MCG/ACT nasal spray Place 2 sprays into both nostrils daily.  . furosemide (LASIX) 20 MG tablet Take 20 mg by mouth daily.  Marland Kitchen gabapentin (NEURONTIN) 300 MG capsule Take 1 capsule by mouth 3 (three) times daily.  Marland Kitchen ipratropium (ATROVENT) 0.03 % nasal spray Place 2 sprays into both nostrils every 12 (twelve) hours.  Marland Kitchen linagliptin (TRADJENTA) 5 MG TABS tablet Take 5 mg by mouth daily.  . metFORMIN (GLUCOPHAGE) 500 MG tablet Take 500 mg by mouth 2 (two) times daily with a meal.  . nitroGLYCERIN (NITROSTAT) 0.4 MG SL tablet DISSOLVE ONE TABLET UNDER TONGUE EVERY 5 MINUTES UP TO 3 DOSES AS NEEDED FOR CHEST PAIN  . oxyCODONE-acetaminophen (PERCOCET) 10-325 MG tablet Limit one tablet by mouth per day or 2-4 times per day for breakthrough pain while wearing  fentanyl patch if tolerated  . OXYGEN 2 lpm with sleep  . pantoprazole (PROTONIX) 40 MG tablet Take 1 tablet (40 mg total) by mouth 2 (two) times daily.  . potassium chloride SA (K-DUR,KLOR-CON) 20 MEQ tablet Take 20 mEq by mouth daily.  . pravastatin (PRAVACHOL) 40 MG tablet Take 1 tablet by mouth daily.  Marland Kitchen Respiratory Therapy Supplies (FLUTTER) DEVI Use as directed  . tamsulosin (FLOMAX) 0.4 MG CAPS capsule Take 0.4 mg by mouth daily.  . traZODone (DESYREL) 100 MG tablet TAKE ONE TABLET BY MOUTH AT BEDTIME AS NEEDED FOR SLEEP  . trospium (SANCTURA) 20 MG tablet Take 20 mg by mouth 2 (two) times daily.                               Objective:   Physical Exam   09/05/2020     211 07/25/2020      208 12/13/2019      209 06/05/2019      198  11/09/2018       213  07/11/2018       205 06/10/2018     214  05/13/2018     211 10/30/2016         205   09/11/16 210 lb 3.2 oz (95.3 kg)  07/31/16 206 lb 9.6 oz (93.7 kg)  03/19/16 215 lb (97.5 kg)     Vital signs reviewed  09/05/2020  - Note at rest 02 sats  96% on RA (02 tank was turned off on arrival   General appearance:    Chronically ill hoarse wm mild increased wob at rest      HEENT : pt wearing mask not removed for  exam due to covid -19 concerns.    NECK :  without JVD/Nodes/TM/ nl carotid upstrokes bilaterally   LUNGS: no acc muscle use,  Mod barrel  contour chest wall with bilateral  Distant bs s audible wheeze and  without cough on insp or exp maneuvers and mod  Hyperresonant  to  percussion bilaterally     CV:  RRR  no s3 or murmur or increase in P2, and trace edema L >R pitting / elastic hose in place  ABD:  Obese soft and nontender with pos mid insp Hoover's  in the supine position. No bruits or organomegaly appreciated, bowel sounds nl  MS:     ext warm without deformities, calf tenderness, cyanosis or clubbing No obvious joint restrictions   SKIN: warm and dry without lesions    NEURO:  alert, approp, nl  sensorium with  no motor or cerebellar deficits apparent.                     Assessment & Plan:

## 2020-09-05 NOTE — Patient Instructions (Addendum)
Ok to leave off breztri since you don't think it helped  Make sure you check your oxygen saturation  at your highest level of activity  to be sure it stays over 90% and adjust  02 flow upward to maintain this level if needed but remember to turn it back to previous settings when you stop (to conserve your supply).   Plan A = Automatic = Always=    Albuterol / ipatropium 4 x daily   Plan B = Backup (to supplement plan A, not to replace it) Only use your albuterol inhaler as a rescue medication to be used if you can't catch your breath by resting or doing a relaxed purse lip breathing pattern.  - The less you use it, the better it will work when you need it. - Ok to use the inhaler up to 2 puffs  every 4 hours if you must but call for appointment if use goes up over your usual need - Don't leave home without it !!  (think of it like the spare tire for your car)   Plan C = Crisis (instead of Plan B but only if Plan B stops working) - only use your albuterol nebulizer if you first try Plan B and it fails to help > ok to use the nebulizer up to every 4 hours but if start needing it regularly call for immediate appointment   Plan D = Deltasone = prednisone  If ABC not working then add Prednisone 10 mg take  4 each am x 2 days,   2 each am x 2 days,  1 each am x 2 days and stop (it is refillable)  For cough > mucinex dm up to 1200 mg every 12 hours and use the flutter valve as much as possible    Return to see Elsworth Soho or Sood with all medications in hand to regroup re long term treatment options    Plan E = ER - go to ER or call 911 if all else fails

## 2020-09-10 ENCOUNTER — Other Ambulatory Visit: Payer: Self-pay | Admitting: Internal Medicine

## 2020-12-04 ENCOUNTER — Other Ambulatory Visit: Payer: Self-pay

## 2020-12-04 MED ORDER — ALBUTEROL SULFATE (2.5 MG/3ML) 0.083% IN NEBU: INHALATION_SOLUTION | RESPIRATORY_TRACT | 3 refills | Status: DC

## 2021-02-11 ENCOUNTER — Telehealth: Payer: Self-pay | Admitting: Pulmonary Disease

## 2021-02-11 DIAGNOSIS — J449 Chronic obstructive pulmonary disease, unspecified: Secondary | ICD-10-CM

## 2021-02-11 NOTE — Telephone Encounter (Signed)
Called and spoke with patient regarding his nebulizer machine. He states he has had the same machine for the past 30 years and it broke. States his PCP states that they were going to work on getting him another one but he has not heard anything back. Patient states he did reach back out to his PCP, still no answer. He would like to know if a request for a new machine can be sent in?  He also states that he wanted to see if he could get Ipratropium inhaler sent into the pharmacy until he gets a new machine. States takes his Albuterol/Ipratropium nebulizer solutions together.  Dr. Halford Chessman please advise  Thanks

## 2021-02-12 NOTE — Telephone Encounter (Signed)
Called and spoke with pt letting him know that Dr. Melvyn Novas said it was okay for [place] to send Rx for new nebulizer in and he verbalized understanding. DME order has been placed. Nothing further needed.

## 2021-02-12 NOTE — Telephone Encounter (Signed)
Ok for neb which they should be able to get him today.    No need for atrovent esp since it may affect his bladder he uses too much

## 2021-02-12 NOTE — Telephone Encounter (Signed)
This is patient of Dr. Melvyn Novas.  Will route message to Dr. Melvyn Novas.

## 2021-02-17 ENCOUNTER — Telehealth: Payer: Self-pay | Admitting: Internal Medicine

## 2021-02-17 NOTE — Telephone Encounter (Signed)
Called [facility] and spoke w/ Maudie Mercury. States they have to order and will call today. Nothing further needed.

## 2021-03-31 ENCOUNTER — Ambulatory Visit: Payer: Medicare Other | Admitting: Cardiology

## 2021-04-03 ENCOUNTER — Ambulatory Visit: Payer: Medicare Other | Admitting: Pulmonary Disease

## 2021-04-17 ENCOUNTER — Encounter: Payer: Self-pay | Admitting: Cardiology

## 2021-04-17 ENCOUNTER — Ambulatory Visit (INDEPENDENT_AMBULATORY_CARE_PROVIDER_SITE_OTHER): Payer: Medicare Other | Admitting: Cardiology

## 2021-04-17 ENCOUNTER — Other Ambulatory Visit: Payer: Self-pay

## 2021-04-17 ENCOUNTER — Other Ambulatory Visit (HOSPITAL_COMMUNITY)
Admission: RE | Admit: 2021-04-17 | Discharge: 2021-04-17 | Disposition: A | Discharge status: Home / Self Care | Payer: Medicare Other | Source: Ambulatory Visit | Attending: Cardiology | Admitting: Cardiology

## 2021-04-17 VITALS — BP 128/64 | HR 96 | Ht 66.0 in | Wt 210.2 lb

## 2021-04-17 DIAGNOSIS — I25700 Atherosclerosis of coronary artery bypass graft(s), unspecified, with unstable angina pectoris: Secondary | ICD-10-CM

## 2021-04-17 DIAGNOSIS — I10 Essential (primary) hypertension: Secondary | ICD-10-CM | POA: Diagnosis not present

## 2021-04-17 DIAGNOSIS — I358 Other nonrheumatic aortic valve disorders: Secondary | ICD-10-CM

## 2021-04-17 DIAGNOSIS — E785 Hyperlipidemia, unspecified: Secondary | ICD-10-CM

## 2021-04-17 LAB — LIPID PANEL
Cholesterol: 167 mg/dL (ref 0–200)
HDL: 53 mg/dL (ref >40)
LDL Cholesterol: 95 mg/dL (ref 0–99)
Total CHOL/HDL Ratio: 3.2 RATIO
Triglycerides: 95 mg/dL (ref <150)
VLDL: 19 mg/dL (ref 0–40)

## 2021-04-17 LAB — ALT: ALT: 15 U/L (ref 0–44)

## 2021-04-17 MED ORDER — DILTIAZEM HCL ER COATED BEADS 120 MG PO CP24: 120.0000 mg | ORAL_CAPSULE | Freq: Every day | ORAL | 3 refills | Status: DC

## 2021-04-17 NOTE — Patient Instructions (Signed)
Medication Instructions:  Your physician has recommended you make the following change in your medication:  1) START taking Cardizem CD 120 mg daily  2) STOP taking Cardizem short acting *If you need a refill on your cardiac medications before your next appointment, please call your pharmacy*   Lab Work: TODAY: FLP and ALT If you have labs (blood work) drawn today and your tests are completely normal, you will receive your results only by: [facility] (if you have MyChart) OR A paper copy in the mail If you have any lab test that is abnormal or we need to change your treatment, we will call you to review the results.  Follow-Up: At [facility], you and your health needs are our priority.  As part of our continuing mission to provide you with exceptional heart care, we have created designated Provider Care Teams.  These Care Teams include your primary Cardiologist (physician) and Advanced Practice Providers (APPs -  Physician Assistants and Nurse Practitioners) who all work together to provide you with the care you need, when you need it.   Your next appointment:   1 year(s)  The format for your next appointment:   In Person  Provider:   You may see Fransico Him, MD or one of the following Advanced Practice Providers on your designated Care Team:   [place], PA-C  Ermalinda Barrios, [place]

## 2021-04-17 NOTE — Addendum Note (Signed)
Addended by: Antonieta Iba on: 04/17/2021 01:38 PM   Modules accepted: Orders

## 2021-04-17 NOTE — Progress Notes (Signed)
Date:  04/17/2021   ID:  Herbert May, DOB Sep 14, 1940, MRN [id number] PCP:  Leonie Douglas, MD  Cardiologist:  Fransico Him, MD Electrophysiologist:  None   Evaluation Performed:  Follow-Up Visit  Chief Complaint:  CAD  History of Present Illness:   History of ASCVD status post stenting x3 and remote history of MI x2, type 2 diabetes mellitus, type of hypertension, dyslipidemia and history of tobacco use but quit years ago.  His last echo was in 2017 showing normal LV function with mild to moderate LVH with EF 55 to 60%, mild aortic valve sclerosis with no stenosis.  Nuclear stress test showed no ischemia and fixed inferior wall defect likely related to diaphragmatic attenuation given normal LV function on echo.  His most recent cardiac catheterization from 02/16/2007 showed wide patency of the mid-LAD stent and at that time, he underwent successful stenting of the proximal LAD. There was insignificant disease in the LCx and RCA.  He is here today for followup and is doing well.  He has chronic DOE related to COPD which is stable.  He denies any chest pain or pressure, PND, orthopnea, LE edema, dizziness, palpitations or syncope. He is compliant with his meds and is tolerating meds with no SE.     Past Medical History:  Diagnosis Date   Allergy    Arthritis    Asthma    CAD (coronary artery disease) of artery bypass graft 12/27/2012   Colostomy status (HCC)    COPD (chronic obstructive pulmonary disease) (HCC)    Diabetes mellitus without complication (HCC)    Emphysema of lung (HCC)    GERD (gastroesophageal reflux disease)    Heart attack ([place])    Hyperlipidemia    Hypertension    Oxygen deficiency    Past Surgical History:  Procedure Laterality Date   BACK SURGERY     cardiac stents     COLOSTOMY     COLOSTOMY CLOSURE     HERNIA REPAIR     KNEE SURGERY     NOSE SURGERY       Current Meds  Medication Sig   acyclovir (ZOVIRAX) 800 MG tablet TAKE ONE TABLET BY  MOUTH 5 TIMES DAILY FOR 7 DAYS FOR SHINGLES   albuterol (PROAIR HFA) 108 (90 BASE) MCG/ACT inhaler Inhale 1 puff into the lungs every 4 (four) hours as needed for wheezing or shortness of breath.   albuterol (PROVENTIL) (2.5 MG/3ML) 0.083% nebulizer solution INHALE CONTENTS OF 1 VIAL IN NEBULIZER EVERY 6 HOURS AS NEEDED FOR WHEEZING OR SHORTNESS OF BREATH.   clopidogrel (PLAVIX) 75 MG tablet Take 1 tablet (75 mg total) by mouth daily.   diltiazem (CARDIZEM) 30 MG tablet Take 1 tablet (30 mg total) by mouth 2 (two) times daily.   fentaNYL (DURAGESIC - DOSED MCG/HR) 100 MCG/HR Apply one patch to skin every 3 days if tolerated   fluticasone (FLONASE) 50 MCG/ACT nasal spray Place 2 sprays into both nostrils daily.   Fluticasone-Umeclidin-Vilant 100-62.5-25 MCG/ACT AEPB 1 puff   gabapentin (NEURONTIN) 300 MG capsule Take 1 capsule by mouth 3 (three) times daily.   ipratropium (ATROVENT) 0.02 % nebulizer solution Take 2.5 mLs (0.5 mg total) by nebulization 4 (four) times daily.   lidocaine (LIDODERM) 5 % 1 patch remove after 12 hours   metFORMIN (GLUCOPHAGE) 1000 MG tablet Take 1,000 mg by mouth 2 (two) times daily.   methocarbamol (ROBAXIN) 500 MG tablet Take 500 mg by mouth at bedtime.   nitroGLYCERIN (NITROSTAT) 0.4 MG  SL tablet DISSOLVE ONE TABLET UNDER TONGUE EVERY 5 MINUTES UP TO 3 DOSES AS NEEDED FOR CHEST PAIN   oxyCODONE-acetaminophen (PERCOCET) 10-325 MG tablet Limit one tablet by mouth per day or 2-4 times per day for breakthrough pain while wearing fentanyl patch if tolerated   pantoprazole (PROTONIX) 40 MG tablet Take 1 tablet (40 mg total) by mouth 2 (two) times daily.   torsemide (DEMADEX) 10 MG tablet Take 10 mg by mouth daily.   traZODone (DESYREL) 100 MG tablet TAKE ONE TABLET BY MOUTH AT BEDTIME AS NEEDED FOR SLEEP   vitamin B-12 (CYANOCOBALAMIN) 1000 MCG tablet Take 1,000 mcg by mouth daily.     Allergies:   Aspirin, Fish allergy, Latex, Onion, Other, Levaquin [levofloxacin in  d5w], and Neosporin [neomycin-bacitracin zn-polymyx]   Social History   Tobacco Use   Smoking status: Former    Packs/day: 1.00    Years: 37.00    Pack years: 37.00    Types: Cigarettes    Start date: 12/30/1953    Quit date: 12/28/1990    Years since quitting: 30.3   Smokeless tobacco: Former    Quit date: 06/30/1983  Vaping Use   Vaping Use: Never used  Substance Use Topics   Alcohol use: Yes    Alcohol/week: 0.0 standard drinks    Comment: occasional   Drug use: No     Family Hx: The patient's family history includes COPD in his mother; Cancer in his mother, sister, and sister; Diabetes in his mother.  ROS:   Please see the history of present illness.     All other systems reviewed and are negative.   Prior CV studies:   The following studies were reviewed today:  Nuclear stress test 09/09/15 demonstrated a non-reversible defect consistent with artifact. There was no reported ischemia. LVEF 62%. However, it was deemed an intermediate risk study.   Echocardiogram 09/04/15 demonstrated normal left ventricular systolic function, EF 23-76%, indeterminate diastolic function and aortic valve sclerosis without stenosis.  Labs/Other Tests and Data Reviewed:    EKG: EKG performed today and demonstrates NSR with LAFB, LVH by voltage and LAD   Recent Labs: No results found for requested labs within last 8760 hours.   Recent Lipid Panel Lab Results  Component Value Date/Time   CHOL 160 12/12/2012 11:13 AM   TRIG 169 (H) 12/12/2012 11:13 AM   HDL 50 12/12/2012 11:13 AM   LDLCALC 76 12/12/2012 11:13 AM    Wt Readings from Last 3 Encounters:  04/17/21 210 lb 3.2 oz (95.3 kg)  09/05/20 211 lb (95.7 kg)  07/25/20 208 lb (94.3 kg)     Objective:    Vital Signs:  BP 128/64 (BP Location: Left Arm, Patient Position: Sitting, Cuff Size: Large)   Pulse 96   Ht 5\' 6"  (1.676 m)   Wt 210 lb 3.2 oz (95.3 kg)   SpO2 98%   BMI 33.93 kg/m    GEN: Well nourished, well developed in  no acute distress HEENT: Normal NECK: No JVD; No carotid bruits LYMPHATICS: No lymphadenopathy CARDIAC:RRR, no murmurs, rubs, gallops RESPIRATORY:  Clear to auscultation without rales, wheezing or rhonchi  ABDOMEN: Soft, non-tender, non-distended MUSCULOSKELETAL:  trace BLE edema; No deformity  SKIN: Warm and dry NEUROLOGIC:  Alert and oriented x 3 PSYCHIATRIC:  Normal affect    ASSESSMENT & PLAN:    1. ASCAD  -s/p 3 stents and 2 MI's -His most recent cardiac catheterization from 02/16/2007 showed wide patency of the mid-LAD stent and at  that time, he underwent successful stenting of the proximal LAD. There was insignificant disease in the LCx and RCA.  -Nuclear stress test 2017 showed fixed inferior defect consistent with diaphragmatic attenuation and no ischemia  -He has had no anginal symptoms since being seen last  -He is allergic to aspirin and intolerant to beta-blockers  -Continue prescription drug management with Plavix 75 mg daily and statin > refilled   2. Aortic sclerosis:  -Noted on echo 2017   3. Essential HTN:  -BP is adequately controlled on exam today.   -change Cardizem short acting to Cardizem CD 120mg  daily for improved compliance   4. Dyslipidemia:  -LDL goal is less than 70  -Check FLP and ALT  -Continue prescription drug management with pravastatin 40 mg daily with as needed refills   Medication Adjustments/Labs and Tests Ordered: Current medicines are reviewed at length with the patient today.  Concerns regarding medicines are outlined above.   Tests Ordered: Orders Placed This Encounter  Procedures   EKG 12-Lead     Medication Changes: No orders of the defined types were placed in this encounter.   Follow Up:  Either In Person or Virtual Visit in 1 year(s)  Signed, Fransico Him, MD  04/17/2021 1:21 PM    [facility]

## 2021-05-01 ENCOUNTER — Other Ambulatory Visit: Payer: Self-pay | Admitting: Internal Medicine

## 2021-08-15 ENCOUNTER — Telehealth: Payer: Self-pay | Admitting: Pulmonary Disease

## 2021-08-15 NOTE — Telephone Encounter (Signed)
Encounter open in error so closing encounter. °

## 2021-10-30 ENCOUNTER — Telehealth: Payer: Self-pay | Admitting: Cardiology

## 2021-10-30 NOTE — Telephone Encounter (Signed)
Pt c/o medication issue: ? ?1. Name of Medication: diltiazem (CARDIZEM CD) 120 MG 24 hr capsule ? ?2. How are you currently taking this medication (dosage and times per day)? Patient taking 90 mg BID as directed by his PCP  and the 120 mg daily ordered by Dr. Radford Pax  ? ?3. Are you having a reaction (difficulty breathing--STAT)? no ? ?4. What is your medication issue? Pharmacy called to get clarification from Dr. Theodosia Blender Nurse regarding how the patient needs to be taking this medication.  ?

## 2021-10-30 NOTE — Telephone Encounter (Signed)
Spoke with Pharmacist, Juliann Pulse, who reports that she noticed that he patient has been getting two prescriptions of Cardizem. She states that Dr. Marlon Pel has been prescribing diltiazem 90 mg twice daily. This was first filled in January 2022 when diltiazem 30 mg was discontinued. When patient saw Dr. Radford Pax in October 2022 it was reported that the patient was still on 30 mg daily. Dr. Radford Pax stopped the diltizem 30 mg daily and prescribed Cardizem CD 120 mg daily. The patient has been getting both diltiazem 90 mg twice daily and diltiazem 120 mg daily filled. The pharmacist states that the 120 mg tablet is being filled as tiadylt so the patient probably does not realize that these are the same medications. The pharmacist states that she is going to reach out to Dr. Alben Deeds office as well.  ? ?I spoke with the patient and verified that he has been taking diltiazem 90 mg twice per day and tiadylt 120 mg once daily. Patient reports that he has been feeling good. At recent doctors visits his blood pressure has been good, 124/?. He is not sure what his heart rate has been running.  ?

## 2021-10-30 NOTE — Telephone Encounter (Signed)
Noted, await direction with dosing from Dr.Turner. ?

## 2021-10-31 NOTE — Telephone Encounter (Signed)
I spoke with patient. He went through his medications and found the Diltiazem 90 mg bottle. He agreed to stop taking and remove from his medications. He  also agreed to continue taking Tiazac 120 mg daily. ? ? ? ?I spoke with pharmacist, Catalina Antigua, at [facility]. I notified him of the medication changes and states he will FYI the other provider, Leonie Douglas, M.D. ? ? ? ?Matt also indicated that patient was calling them also and that he would reiterate what we had just discussed. ?

## 2021-10-31 NOTE — Telephone Encounter (Signed)
Patient sleeping, I will call back. ?

## 2021-11-28 ENCOUNTER — Telehealth: Payer: Self-pay

## 2021-11-28 NOTE — Telephone Encounter (Signed)
ATC patient to see if he wanted to reschedule appt on Monday 11/28/2021 with Dr. Melvyn Novas. Patient normally sees Dr. Melvyn Novas and in last avs from Dr. Halford Chessman visit he recommended patient f/u with Dr. Melvyn Novas.   No VM available

## 2021-11-28 NOTE — Telephone Encounter (Signed)
Edit to add:  From MW last ov note: Have asked him to seek another pulmonary provider as consistently inconsistent following instructions while arguing that he is.   Nothing further needed. Will get more information from patient if he calls back

## 2021-12-01 ENCOUNTER — Ambulatory Visit: Payer: Medicare Other | Admitting: Pulmonary Disease

## 2022-01-08 ENCOUNTER — Telehealth: Payer: Self-pay | Admitting: Cardiology

## 2022-01-08 MED ORDER — NITROGLYCERIN 0.4 MG SL SUBL: SUBLINGUAL_TABLET | SUBLINGUAL | 3 refills | Status: DC

## 2022-01-08 NOTE — Telephone Encounter (Signed)
*  STAT* If patient is at the pharmacy, call can be transferred to refill team.   1. Which medications need to be refilled? (please list name of each medication and dose if known) nitroGLYCERIN (NITROSTAT) 0.4 MG SL tablet  2. Which pharmacy/location (including street and city if local pharmacy) is medication to be sent to? [facility] - [place], [place] - [place]  3. Do they need a 30 day or 90 day supply? [place]

## 2022-01-08 NOTE — Telephone Encounter (Signed)
Refill complete 

## 2022-01-10 ENCOUNTER — Other Ambulatory Visit: Payer: Self-pay | Admitting: Cardiology

## 2022-03-02 ENCOUNTER — Encounter (HOSPITAL_COMMUNITY): Payer: Self-pay

## 2022-03-02 ENCOUNTER — Inpatient Hospital Stay (HOSPITAL_COMMUNITY): Payer: Medicare Other

## 2022-03-02 ENCOUNTER — Inpatient Hospital Stay (HOSPITAL_COMMUNITY)
Admit: 2022-03-02 | Discharge: 2022-03-07 | DRG: 872 | Disposition: A | Discharge status: Home / Self Care | Payer: Medicare Other | Source: Other Acute Inpatient Hospital | Attending: Internal Medicine | Admitting: Internal Medicine

## 2022-03-02 DIAGNOSIS — N200 Calculus of kidney: Secondary | ICD-10-CM | POA: Diagnosis present

## 2022-03-02 DIAGNOSIS — I110 Hypertensive heart disease with heart failure: Secondary | ICD-10-CM | POA: Diagnosis present

## 2022-03-02 DIAGNOSIS — Z91018 Allergy to other foods: Secondary | ICD-10-CM

## 2022-03-02 DIAGNOSIS — I252 Old myocardial infarction: Secondary | ICD-10-CM

## 2022-03-02 DIAGNOSIS — K219 Gastro-esophageal reflux disease without esophagitis: Secondary | ICD-10-CM | POA: Diagnosis present

## 2022-03-02 DIAGNOSIS — E785 Hyperlipidemia, unspecified: Secondary | ICD-10-CM | POA: Diagnosis present

## 2022-03-02 DIAGNOSIS — Z7984 Long term (current) use of oral hypoglycemic drugs: Secondary | ICD-10-CM | POA: Diagnosis not present

## 2022-03-02 DIAGNOSIS — K8050 Calculus of bile duct without cholangitis or cholecystitis without obstruction: Secondary | ICD-10-CM | POA: Diagnosis not present

## 2022-03-02 DIAGNOSIS — Z0189 Encounter for other specified special examinations: Secondary | ICD-10-CM

## 2022-03-02 DIAGNOSIS — J449 Chronic obstructive pulmonary disease, unspecified: Secondary | ICD-10-CM | POA: Diagnosis not present

## 2022-03-02 DIAGNOSIS — J9611 Chronic respiratory failure with hypoxia: Secondary | ICD-10-CM | POA: Diagnosis present

## 2022-03-02 DIAGNOSIS — K8071 Calculus of gallbladder and bile duct without cholecystitis with obstruction: Secondary | ICD-10-CM | POA: Diagnosis not present

## 2022-03-02 DIAGNOSIS — Z91040 Latex allergy status: Secondary | ICD-10-CM

## 2022-03-02 DIAGNOSIS — Z79891 Long term (current) use of opiate analgesic: Secondary | ICD-10-CM | POA: Diagnosis not present

## 2022-03-02 DIAGNOSIS — Z881 Allergy status to other antibiotic agents status: Secondary | ICD-10-CM

## 2022-03-02 DIAGNOSIS — K8032 Calculus of bile duct with acute cholangitis without obstruction: Secondary | ICD-10-CM | POA: Diagnosis present

## 2022-03-02 DIAGNOSIS — I350 Nonrheumatic aortic (valve) stenosis: Secondary | ICD-10-CM | POA: Diagnosis present

## 2022-03-02 DIAGNOSIS — J439 Emphysema, unspecified: Secondary | ICD-10-CM | POA: Diagnosis present

## 2022-03-02 DIAGNOSIS — K432 Incisional hernia without obstruction or gangrene: Secondary | ICD-10-CM | POA: Diagnosis present

## 2022-03-02 DIAGNOSIS — Z7902 Long term (current) use of antithrombotics/antiplatelets: Secondary | ICD-10-CM | POA: Diagnosis not present

## 2022-03-02 DIAGNOSIS — R579 Shock, unspecified: Secondary | ICD-10-CM | POA: Diagnosis not present

## 2022-03-02 DIAGNOSIS — Z886 Allergy status to analgesic agent status: Secondary | ICD-10-CM | POA: Diagnosis not present

## 2022-03-02 DIAGNOSIS — G8929 Other chronic pain: Secondary | ICD-10-CM | POA: Diagnosis present

## 2022-03-02 DIAGNOSIS — K8309 Other cholangitis: Secondary | ICD-10-CM

## 2022-03-02 DIAGNOSIS — E119 Type 2 diabetes mellitus without complications: Secondary | ICD-10-CM | POA: Diagnosis present

## 2022-03-02 DIAGNOSIS — R6521 Severe sepsis with septic shock: Secondary | ICD-10-CM | POA: Diagnosis present

## 2022-03-02 DIAGNOSIS — Z87891 Personal history of nicotine dependence: Secondary | ICD-10-CM

## 2022-03-02 DIAGNOSIS — Z6828 Body mass index (BMI) 28.0-28.9, adult: Secondary | ICD-10-CM

## 2022-03-02 DIAGNOSIS — A419 Sepsis, unspecified organism: Principal | ICD-10-CM | POA: Diagnosis present

## 2022-03-02 DIAGNOSIS — K8051 Calculus of bile duct without cholangitis or cholecystitis with obstruction: Secondary | ICD-10-CM | POA: Diagnosis not present

## 2022-03-02 DIAGNOSIS — Z9049 Acquired absence of other specified parts of digestive tract: Secondary | ICD-10-CM

## 2022-03-02 DIAGNOSIS — E669 Obesity, unspecified: Secondary | ICD-10-CM | POA: Diagnosis present

## 2022-03-02 DIAGNOSIS — R7401 Elevation of levels of liver transaminase levels: Secondary | ICD-10-CM | POA: Diagnosis present

## 2022-03-02 DIAGNOSIS — Z7951 Long term (current) use of inhaled steroids: Secondary | ICD-10-CM

## 2022-03-02 DIAGNOSIS — Z955 Presence of coronary angioplasty implant and graft: Secondary | ICD-10-CM

## 2022-03-02 DIAGNOSIS — R7989 Other specified abnormal findings of blood chemistry: Secondary | ICD-10-CM | POA: Diagnosis not present

## 2022-03-02 DIAGNOSIS — I509 Heart failure, unspecified: Secondary | ICD-10-CM | POA: Diagnosis present

## 2022-03-02 DIAGNOSIS — I2510 Atherosclerotic heart disease of native coronary artery without angina pectoris: Secondary | ICD-10-CM | POA: Diagnosis present

## 2022-03-02 DIAGNOSIS — Z825 Family history of asthma and other chronic lower respiratory diseases: Secondary | ICD-10-CM

## 2022-03-02 DIAGNOSIS — I25118 Atherosclerotic heart disease of native coronary artery with other forms of angina pectoris: Secondary | ICD-10-CM | POA: Diagnosis not present

## 2022-03-02 DIAGNOSIS — M1990 Unspecified osteoarthritis, unspecified site: Secondary | ICD-10-CM | POA: Diagnosis present

## 2022-03-02 DIAGNOSIS — Z01810 Encounter for preprocedural cardiovascular examination: Secondary | ICD-10-CM | POA: Diagnosis not present

## 2022-03-02 DIAGNOSIS — Z833 Family history of diabetes mellitus: Secondary | ICD-10-CM

## 2022-03-02 DIAGNOSIS — Z883 Allergy status to other anti-infective agents status: Secondary | ICD-10-CM

## 2022-03-02 DIAGNOSIS — Z9981 Dependence on supplemental oxygen: Secondary | ICD-10-CM

## 2022-03-02 DIAGNOSIS — Z91013 Allergy to seafood: Secondary | ICD-10-CM

## 2022-03-02 DIAGNOSIS — Z79899 Other long term (current) drug therapy: Secondary | ICD-10-CM

## 2022-03-02 LAB — COMPREHENSIVE METABOLIC PANEL
ALT: 329 U/L — ABNORMAL HIGH (ref 0–44)
AST: 291 U/L — ABNORMAL HIGH (ref 15–41)
Albumin: 2.8 g/dL — ABNORMAL LOW (ref 3.5–5.0)
Alkaline Phosphatase: 97 U/L (ref 38–126)
Anion gap: 9 (ref 5–15)
BUN: 12 mg/dL (ref 8–23)
CO2: 23 mmol/L (ref 22–32)
Calcium: 8.3 mg/dL — ABNORMAL LOW (ref 8.9–10.3)
Chloride: 109 mmol/L (ref 98–111)
Creatinine, Ser: 1.2 mg/dL (ref 0.61–1.24)
GFR, Estimated: >60 mL/min (ref >60)
Glucose, Bld: 137 mg/dL — ABNORMAL HIGH (ref 70–99)
Potassium: 5 mmol/L (ref 3.5–5.1)
Sodium: 141 mmol/L (ref 135–145)
Total Bilirubin: 4.1 mg/dL — ABNORMAL HIGH (ref 0.3–1.2)
Total Protein: 5.2 g/dL — ABNORMAL LOW (ref 6.5–8.1)

## 2022-03-02 LAB — ECHOCARDIOGRAM COMPLETE
AR max vel: 1.35 cm2
AV Area VTI: 1.47 cm2
AV Area mean vel: 1.44 cm2
AV Mean grad: 8 mmHg
AV Peak grad: 17 mmHg
Ao pk vel: 2.06 m/s
Area-P 1/2: 3.17 cm2
Height: 67 in
S' Lateral: 2.7 cm
Weight: 2860.69 oz

## 2022-03-02 LAB — LIPASE, BLOOD: Lipase: 18 U/L (ref 11–51)

## 2022-03-02 LAB — TYPE AND SCREEN
ABO/RH(D): A POS
Antibody Screen: NEGATIVE

## 2022-03-02 LAB — HEMOGLOBIN A1C
Hgb A1c MFr Bld: 6.1 % — ABNORMAL HIGH (ref 4.8–5.6)
Mean Plasma Glucose: 128.37 mg/dL

## 2022-03-02 LAB — GLUCOSE, CAPILLARY
Glucose-Capillary: 128 mg/dL — ABNORMAL HIGH (ref 70–99)
Glucose-Capillary: 104 mg/dL — ABNORMAL HIGH (ref 70–99)

## 2022-03-02 LAB — MRSA NEXT GEN BY PCR, NASAL: MRSA by PCR Next Gen: NOT DETECTED

## 2022-03-02 MED ORDER — PIPERACILLIN-TAZOBACTAM 3.375 G IVPB
3.3750 g | Freq: Three times a day (TID) | INTRAVENOUS | Status: DC
  Administered 2022-03-02 – 2022-03-07 (×15): 3.375 g via INTRAVENOUS
  Filled 2022-03-02 (×14): qty 50

## 2022-03-02 MED ORDER — PIPERACILLIN-TAZOBACTAM 3.375 G IVPB 30 MIN
3.3750 g | Freq: Once | INTRAVENOUS | Status: DC
  Filled 2022-03-02: qty 50

## 2022-03-02 MED ORDER — FLUTICASONE FUROATE-VILANTEROL 100-25 MCG/ACT IN AEPB
1.0000 | INHALATION_SPRAY | Freq: Every day | RESPIRATORY_TRACT | Status: DC
  Administered 2022-03-03 – 2022-03-07 (×5): 1 via RESPIRATORY_TRACT
  Filled 2022-03-02: qty 28

## 2022-03-02 MED ORDER — ENOXAPARIN SODIUM 30 MG/0.3ML IJ SOSY: 30.0000 mg | PREFILLED_SYRINGE | INTRAMUSCULAR | Status: DC

## 2022-03-02 MED ORDER — METHOCARBAMOL 500 MG PO TABS
500.0000 mg | ORAL_TABLET | Freq: Every day | ORAL | Status: DC
  Administered 2022-03-02 – 2022-03-06 (×5): 500 mg via ORAL
  Filled 2022-03-02 (×5): qty 1

## 2022-03-02 MED ORDER — PERFLUTREN LIPID MICROSPHERE
1.0000 mL | INTRAVENOUS | Status: AC | PRN
  Administered 2022-03-02: 2 mL via INTRAVENOUS

## 2022-03-02 MED ORDER — ENOXAPARIN SODIUM 40 MG/0.4ML IJ SOSY
40.0000 mg | PREFILLED_SYRINGE | INTRAMUSCULAR | Status: DC
  Administered 2022-03-04: 40 mg via SUBCUTANEOUS
  Filled 2022-03-02: qty 0.4

## 2022-03-02 MED ORDER — FLUTICASONE PROPIONATE 50 MCG/ACT NA SUSP
2.0000 | Freq: Every day | NASAL | Status: DC
Start: 2022-03-03 — End: 2022-03-07
  Administered 2022-03-03 – 2022-03-07 (×5): 2 via NASAL
  Filled 2022-03-02: qty 16

## 2022-03-02 MED ORDER — ORAL CARE MOUTH RINSE: 15.0000 mL | OROMUCOSAL | Status: DC | PRN

## 2022-03-02 MED ORDER — OXYCODONE HCL 5 MG PO TABS
10.0000 mg | ORAL_TABLET | Freq: Four times a day (QID) | ORAL | Status: DC | PRN
  Administered 2022-03-02 – 2022-03-04 (×4): 10 mg via ORAL
  Filled 2022-03-02 (×4): qty 2

## 2022-03-02 MED ORDER — GABAPENTIN 300 MG PO CAPS
300.0000 mg | ORAL_CAPSULE | Freq: Three times a day (TID) | ORAL | Status: DC
  Administered 2022-03-02 – 2022-03-07 (×14): 300 mg via ORAL
  Filled 2022-03-02 (×14): qty 1

## 2022-03-02 MED ORDER — IOHEXOL 300 MG/ML  SOLN
100.0000 mL | Freq: Once | INTRAMUSCULAR | Status: AC | PRN
Start: 2022-03-02 — End: 2022-03-02
  Administered 2022-03-02: 100 mL via INTRAVENOUS

## 2022-03-02 MED ORDER — INSULIN ASPART 100 UNIT/ML IJ SOLN
0.0000 [IU] | Freq: Three times a day (TID) | INTRAMUSCULAR | Status: DC
  Administered 2022-03-02: 2 [IU] via SUBCUTANEOUS
  Administered 2022-03-03: 3 [IU] via SUBCUTANEOUS
  Administered 2022-03-04: 5 [IU] via SUBCUTANEOUS
  Administered 2022-03-05: 8 [IU] via SUBCUTANEOUS
  Administered 2022-03-06: 5 [IU] via SUBCUTANEOUS
  Administered 2022-03-06 – 2022-03-07 (×2): 2 [IU] via SUBCUTANEOUS

## 2022-03-02 MED ORDER — IOHEXOL 9 MG/ML PO SOLN
500.0000 mL | ORAL | Status: AC
  Administered 2022-03-02 (×2): 500 mL via ORAL

## 2022-03-02 MED ORDER — POLYETHYLENE GLYCOL 3350 17 G PO PACK: 17.0000 g | PACK | Freq: Every day | ORAL | Status: DC | PRN

## 2022-03-02 MED ORDER — UMECLIDINIUM BROMIDE 62.5 MCG/ACT IN AEPB
1.0000 | INHALATION_SPRAY | Freq: Every day | RESPIRATORY_TRACT | Status: DC
  Administered 2022-03-03 – 2022-03-07 (×5): 1 via RESPIRATORY_TRACT
  Filled 2022-03-02: qty 7

## 2022-03-02 MED ORDER — ALBUTEROL SULFATE (2.5 MG/3ML) 0.083% IN NEBU
2.5000 mg | INHALATION_SOLUTION | Freq: Four times a day (QID) | RESPIRATORY_TRACT | Status: DC | PRN
Start: 2022-03-02 — End: 2022-03-07

## 2022-03-02 MED ORDER — FENTANYL 100 MCG/HR TD PT72: 1.0000 | MEDICATED_PATCH | TRANSDERMAL | Status: DC

## 2022-03-02 MED ORDER — PANTOPRAZOLE SODIUM 40 MG PO TBEC
40.0000 mg | DELAYED_RELEASE_TABLET | Freq: Two times a day (BID) | ORAL | Status: DC
  Administered 2022-03-02 – 2022-03-07 (×10): 40 mg via ORAL
  Filled 2022-03-02 (×10): qty 1

## 2022-03-02 MED ORDER — ONDANSETRON HCL 4 MG/2ML IJ SOLN
4.0000 mg | Freq: Four times a day (QID) | INTRAMUSCULAR | Status: DC | PRN
  Administered 2022-03-02 – 2022-03-05 (×2): 4 mg via INTRAVENOUS
  Filled 2022-03-02 (×3): qty 2

## 2022-03-02 MED ORDER — ENOXAPARIN SODIUM 30 MG/0.3ML IJ SOSY
30.0000 mg | PREFILLED_SYRINGE | INTRAMUSCULAR | Status: DC
  Administered 2022-03-02: 30 mg via SUBCUTANEOUS
  Filled 2022-03-02: qty 0.3

## 2022-03-02 MED ORDER — SODIUM CHLORIDE 0.9 % IV SOLN: INTRAVENOUS | Status: DC | PRN

## 2022-03-02 MED ORDER — VITAMIN B-12 1000 MCG PO TABS
1000.0000 ug | ORAL_TABLET | Freq: Every day | ORAL | Status: DC
Start: 2022-03-03 — End: 2022-03-07
  Administered 2022-03-03 – 2022-03-07 (×5): 1000 ug via ORAL
  Filled 2022-03-02 (×5): qty 1

## 2022-03-02 MED ORDER — CHLORHEXIDINE GLUCONATE CLOTH 2 % EX PADS
6.0000 | MEDICATED_PAD | Freq: Every day | CUTANEOUS | Status: DC
  Administered 2022-03-02 – 2022-03-07 (×5): 6 via TOPICAL

## 2022-03-02 MED ORDER — LORAZEPAM 2 MG/ML IJ SOLN
1.0000 mg | Freq: Once | INTRAMUSCULAR | Status: AC
  Administered 2022-03-02: 1 mg via INTRAVENOUS
  Filled 2022-03-02: qty 1

## 2022-03-02 MED ORDER — TRAZODONE HCL 50 MG PO TABS
100.0000 mg | ORAL_TABLET | Freq: Every evening | ORAL | Status: DC | PRN
  Administered 2022-03-04 – 2022-03-06 (×4): 100 mg via ORAL
  Filled 2022-03-02 (×4): qty 2

## 2022-03-02 MED ORDER — LIDOCAINE 5 % EX PTCH
1.0000 | MEDICATED_PATCH | CUTANEOUS | Status: DC
  Administered 2022-03-02 – 2022-03-06 (×5): 1 via TRANSDERMAL
  Filled 2022-03-02 (×5): qty 1

## 2022-03-02 MED ORDER — DOCUSATE SODIUM 100 MG PO CAPS: 100.0000 mg | ORAL_CAPSULE | Freq: Two times a day (BID) | ORAL | Status: DC | PRN

## 2022-03-02 NOTE — Progress Notes (Signed)
eLink Physician-Brief Progress Note Patient Name: Aram Domzalski DOB: Oct 09, 1940 MRN: [id number]   Date of Service  03/02/2022  HPI/Events of Note  Patient is due to go to CT in a few minutes now but very anxious and says he gets claustrophobic quickly and now severely anxious to go down to it. Already finished his oral contrast and got some Zofran for nausea but otherwise awake and alert, in no distress on room air and not on pressors  eICU Interventions  One time dose of IV ativan ordered to facilitate CT . D/w RN      Intervention Category Minor Interventions: Agitation / anxiety - evaluation and management  Margaretmary Lombard 03/02/2022, 8:15 PM

## 2022-03-02 NOTE — Progress Notes (Addendum)
Pt arrived to MICU at 1530 on Levophed at 5. Transferred by carelink. Levo titrated down and stopped by 1600. Dr. Tamala Julian aware.

## 2022-03-02 NOTE — Progress Notes (Signed)
Pharmacy Antibiotic Note  Herbert May is a 81 y.o. male admitted on 03/02/2022 with  intra-abdominal infection .  Pharmacy has been consulted for zosyn dosing.  Presenting with worsening back pain - OSH labs showing LA 2.9>2.3. Scr 1.15. AST/ALT 599/458, alk phos 142. [place] RUQ showing cholecystitis. Last dose of zosyn was on 9/4_0 .  Plan: Zosyn 3.375g IV q8h (4 hour infusion). Monitor cx results, clinical pic, and LOT  Height: 5' 7" (170.2 cm) Weight: 81.1 kg (178 lb 12.7 oz) IBW/kg (Calculated) : 66.1  No data recorded.  No results for input(s): "WBC", "CREATININE", "LATICACIDVEN", "VANCOTROUGH", "VANCOPEAK", "VANCORANDOM", "GENTTROUGH", "GENTPEAK", "GENTRANDOM", "TOBRATROUGH", "TOBRAPEAK", "TOBRARND", "AMIKACINPEAK", "AMIKACINTROU", "AMIKACIN" in the last 168 hours.  CrCl cannot be calculated (Patient's most recent lab result is older than the maximum 21 days allowed.).    Allergies  Allergen Reactions   Aspirin Hives and Itching   Fish Allergy Nausea And Vomiting   Latex Other (See Comments)    Tears Skin    Onion Nausea And Vomiting   Other Other (See Comments)    Just doesn't like [place]    Levaquin [Levofloxacin In D5w] Rash   Neosporin [Neomycin-Bacitracin Zn-Polymyx] Rash    Antimicrobials this admission: Zosyn 9/4 >>   Dose adjustments this admission: N/A  Microbiology results: 9/4 MRSA PCR: sent  Thank you for allowing pharmacy to be a part of this patient's care.  Antonietta Jewel, PharmD, [place] Clinical Pharmacist  Phone: [phone] 03/02/2022 4:05 PM  Please check AMION for all [place] phone numbers After 10:00 PM, call [place] [phone]

## 2022-03-02 NOTE — H&P (Signed)
NAME:  Herbert May, MRN:  [id number], DOB:  1940-07-12, LOS: 0 ADMISSION DATE:  (Not on file), CONSULTATION DATE:  03/02/2022 REFERRING MD:  Georgette Dover, CHIEF COMPLAINT:  septic shock   History of Present Illness:   81 year old male transferred from [facility] with septic shock presumed abdominal course.  Presented with worsening back pain yesterday that then became associated with abdominal pain.  His home fent patch + oxycodone did not relieve pain so came to ER.  Labs there show an acute liver injury with mildly cholestatic pattern.  [place] with GB thickening + stones but no Murphy sign and normal CBD.    On some levophed so sent to [facility] for CT scan ([facility] CT scanner down) and further workup.  At time of [facility] to [facility] he has been weaned off levophed.  He is asking for food.  His pain is getting better but still present.  Pertinent  Medical History  Former smoker, chronic hypoxic respiratory failure on 3-5L home O2 (at night), COPD (f/b MW in office), CAD w/ stents, prior MI, DMT2, HTN, HLD, GERD, chronic back pain  Significant Hospital Events: Including procedures, antibiotic start and stop dates in addition to other pertinent events   9/4 tx [facility]> [facility], septic shock  Interim History / Subjective:  Consulted.  Objective   BP 120/80  Examination: General: mildly jaundiced, no distress HENT: MMM, trachea midline Lungs: Clear, no wheezing, no accessory muscle use Cardiovascular: Irregular, in afib, ext warm Abdomen: soft, point tenderness around epigastrum Extremities: 1+ edema Neuro: moves all 4 ext Skin: chronic PVD changes  Workup OSH 9/4>  WBC 11, Hgb 14.2, plts 208, Na 138, K 4.1, bicarb 27, sCr 1.1, glucose 160, lactic 2.9> 2.3> 2.6  CXR > no acute cardiopulmonary abnormalities, chronic interstitial lung disease  RUQ [place] >   1. Abnormal appearance of the gallbladder with multiple stones, wall thickening and pericholecystic fluid. Findings support acute cholecystitis in the  proper clinical setting. 2. Small hyperechoic lesion in the right liver lobe. This is not fully characterized, but suspected to be a hemangioma.   KUB> probable right intrarenal stone  Resolved Hospital Problem list    Assessment & Plan:  Septic vs. Hypovolemic shock- intra-abdominal source, seems to have responded to crystalloid.  Now off pressors. Transaminitis- with GB thickening, elevated bili but no CBD dilation nor RUQ pain.  Location of pain and story is a little confusing, CT would be helpful especially given multiple prior abd surgeries.  Given rapid clinical improvement do not think needs OR urgently. - CT w/wo contrast - Zosyn - Trend LFTs - Okay for cardiac diet, NPO MN - Consider surgical consult in AM depending on CT findings  Hx CAD, stents in place- hold plavix as stents seem to be old.  Allergic to aspirin. 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  Basic Metabolic Panel: No results for input(s): "NA", "K", "CL", "CO2", "GLUCOSE", "BUN", "CREATININE", "CALCIUM", "MG", "PHOS"  in the last 168 hours. GFR: CrCl cannot be calculated (Patient's most recent lab result is older than the maximum 21 days allowed.). No results for input(s): "PROCALCITON", "WBC", "LATICACIDVEN" in the last 168 hours.  Liver Function Tests: No results for input(s): "AST", "ALT", "ALKPHOS", "BILITOT", "PROT", "ALBUMIN" in the last 168 hours. No results for  input(s): "LIPASE", "AMYLASE" in the last 168 hours. No results for input(s): "AMMONIA" in the last 168 hours.  ABG No results found for: "PHART", "PCO2ART", "PO2ART", "HCO3", "TCO2", "ACIDBASEDEF", "O2SAT"   Coagulation Profile: No results for input(s): "INR", "PROTIME" in the last 168 hours.  Cardiac Enzymes: No results for input(s): "CKTOTAL", "CKMB", "CKMBINDEX", "TROPONINI" in the last 168 hours.  HbA1C: Hemoglobin A1C  Date/Time Value Ref Range Status  03/15/2017 12:00 AM 7.2  Final  02/08/2014 12:51 PM 6.4  Final  12/12/2012 11:35 AM 6.1%  Final    Comment:    normal range 4.2-6.3%    CBG: No results for input(s): "GLUCAP" in the last 168 hours.  Review of Systems:    Positive Symptoms in bold:  Constitutional fevers, chills, weight loss, fatigue, anorexia, malaise  Eyes decreased vision, double vision, eye irritation  Ears, Nose, Mouth, Throat sore throat, trouble swallowing, sinus congestion  Cardiovascular chest pain, paroxysmal nocturnal dyspnea, lower ext edema, palpitations   Respiratory SOB, cough, DOE, hemoptysis, wheezing  Gastrointestinal nausea, vomiting, diarrhea  Genitourinary burning with urination, trouble urinating  Musculoskeletal joint aches, joint swelling, back pain  Integumentary  rashes, skin lesions  Neurological focal weakness, focal numbness, trouble speaking, headaches  Psychiatric depression, anxiety, confusion  Endocrine polyuria, polydipsia, cold intolerance, heat intolerance  Hematologic abnormal bruising, abnormal bleeding, unexplained nose bleeds  Allergic/Immunologic recurrent infections, hives, swollen lymph nodes     Past Medical History:  He,  has a past medical history of Allergy, Arthritis, Asthma, CAD (coronary artery disease) of artery bypass graft (12/27/2012), Colostomy status ([place]), COPD (chronic obstructive pulmonary disease) ([place]), Diabetes mellitus without complication ([place]), Emphysema of lung ([place]), GERD  (gastroesophageal reflux disease), Heart attack ([place]), Hyperlipidemia, Hypertension, and Oxygen deficiency.   Surgical History:   Past Surgical History:  Procedure Laterality Date   BACK SURGERY     cardiac stents     COLOSTOMY     COLOSTOMY CLOSURE     HERNIA REPAIR     KNEE SURGERY     NOSE SURGERY       Social History:   reports that he quit smoking about 31 years ago. His smoking use included cigarettes. He started smoking about 68 years ago. He has a 37.00 pack-year smoking history. He quit smokeless tobacco use about 38 years ago. He reports current alcohol use. He reports that he does not use drugs.   Family History:  His family history includes COPD in his mother; Cancer in his mother, sister, and sister; Diabetes in his mother.   Allergies Allergies  Allergen Reactions   Aspirin Hives and Itching   Fish Allergy Nausea And Vomiting   Latex Other (See Comments)    Tears Skin    Onion Nausea And Vomiting   Other Other (See Comments)    Just doesn't like [place]    Levaquin [Levofloxacin In D5w] Rash   Neosporin [Neomycin-Bacitracin Zn-Polymyx] Rash     Home Medications  Prior to Admission medications   Medication Sig Start Date End Date Taking? Authorizing Provider  acyclovir (ZOVIRAX) 800 MG tablet TAKE ONE TABLET BY MOUTH 5 TIMES DAILY FOR 7 DAYS FOR SHINGLES 12/02/20  [provider]  albuterol (PROAIR HFA) 108 (90 BASE) MCG/ACT inhaler Inhale 1 puff into the lungs every 4 (four) hours as needed for wheezing or shortness of breath. 10/31/13   Lysbeth Penner, FNP  albuterol (PROVENTIL) (2.5 MG/3ML) 0.083% nebulizer solution INHALE CONTENTS OF 1 VIAL IN NEBULIZER EVERY 6 HOURS AS NEEDED FOR WHEEZING OR SHORTNESS OF BREATH. 05/06/21   Tanda Rockers, MD  clopidogrel (PLAVIX) 75 MG tablet Take 1 tablet (75 mg total) by mouth daily. 02/21/18   Herminio Commons, MD  fentaNYL (DURAGESIC - DOSED MCG/HR) 100 MCG/HR Apply one patch to skin every 3 days if tolerated  02/26/16   Mohammed Kindle, MD  fluticasone [facility]) 50 MCG/ACT nasal spray Place 2 sprays into both nostrils daily.    [provider]  Fluticasone-Umeclidin-Vilant 100-62.5-25 MCG/ACT AEPB 1 puff    [provider]  gabapentin (NEURONTIN) 300 MG capsule Take 1 capsule by mouth 3 (three) times daily. 02/19/15   [provider]  ipratropium (ATROVENT) 0.02 % nebulizer solution Take 2.5 mLs (0.5 mg total) by nebulization 4 (four) times daily. 09/05/20   Tanda Rockers, MD  lidocaine (LIDODERM) 5 % 1 patch remove after 12 hours    [provider]  linagliptin (TRADJENTA) 5 MG TABS tablet Take 5 mg by mouth daily. Patient not taking: Reported on 04/17/2021    [provider]  metFORMIN (GLUCOPHAGE) 1000 MG tablet Take 1,000 mg by mouth 2 (two) times daily. 04/02/21   [provider]  methocarbamol (ROBAXIN) 500 MG tablet Take 500 mg by mouth at bedtime. 04/07/21   [provider]  nitroGLYCERIN (NITROSTAT) 0.4 MG SL tablet DISSOLVE ONE TABLET UNDER TONGUE EVERY 5 MINUTES UP TO 3 DOSES AS NEEDED FOR CHEST PAIN 01/08/22   Sueanne Margarita, MD  oxybutynin (DITROPAN-XL) 10 MG 24 hr tablet TAKE ONE TABLET BY MOUTH DAILY FOR BLADDER CONTROL Patient not taking: Reported on 04/17/2021 02/28/21   [provider]  oxyCODONE-acetaminophen (PERCOCET) 10-325 MG tablet Limit one tablet by mouth per day or 2-4 times per day for breakthrough pain while wearing fentanyl patch if tolerated 02/26/16   Mohammed Kindle, MD  pantoprazole (PROTONIX) 40 MG tablet Take 1 tablet (40 mg total) by mouth 2 (two) times daily. 10/31/13   Lysbeth Penner, FNP  TIADYLT ER 120 MG 24 hr capsule TAKE ONE CAPSULE BY MOUTH DAILY 01/12/22   Sueanne Margarita, MD  traZODone (DESYREL) 100 MG tablet TAKE ONE TABLET BY MOUTH AT BEDTIME AS NEEDED FOR SLEEP 03/15/14   Lysbeth Penner, FNP  vitamin B-12 (CYANOCOBALAMIN) 1000 MCG tablet Take 1,000 mcg by mouth daily. 03/17/21   [provider]     Critical care time: N/A

## 2022-03-03 DIAGNOSIS — K8309 Other cholangitis: Secondary | ICD-10-CM | POA: Diagnosis not present

## 2022-03-03 DIAGNOSIS — K8050 Calculus of bile duct without cholangitis or cholecystitis without obstruction: Secondary | ICD-10-CM | POA: Diagnosis not present

## 2022-03-03 DIAGNOSIS — R579 Shock, unspecified: Secondary | ICD-10-CM | POA: Diagnosis not present

## 2022-03-03 LAB — HEPATIC FUNCTION PANEL
ALT: 264 U/L — ABNORMAL HIGH (ref 0–44)
AST: 152 U/L — ABNORMAL HIGH (ref 15–41)
Albumin: 2.6 g/dL — ABNORMAL LOW (ref 3.5–5.0)
Alkaline Phosphatase: 88 U/L (ref 38–126)
Bilirubin, Direct: 3 mg/dL — ABNORMAL HIGH (ref 0.0–0.2)
Indirect Bilirubin: 1.6 mg/dL — ABNORMAL HIGH (ref 0.3–0.9)
Total Bilirubin: 4.6 mg/dL — ABNORMAL HIGH (ref 0.3–1.2)
Total Protein: 5.4 g/dL — ABNORMAL LOW (ref 6.5–8.1)

## 2022-03-03 LAB — BASIC METABOLIC PANEL
Anion gap: 8 (ref 5–15)
BUN: 12 mg/dL (ref 8–23)
CO2: 23 mmol/L (ref 22–32)
Calcium: 8.3 mg/dL — ABNORMAL LOW (ref 8.9–10.3)
Chloride: 107 mmol/L (ref 98–111)
Creatinine, Ser: 0.98 mg/dL (ref 0.61–1.24)
GFR, Estimated: >60 mL/min (ref >60)
Glucose, Bld: 99 mg/dL (ref 70–99)
Potassium: 4 mmol/L (ref 3.5–5.1)
Sodium: 138 mmol/L (ref 135–145)

## 2022-03-03 LAB — GLUCOSE, CAPILLARY
Glucose-Capillary: 87 mg/dL (ref 70–99)
Glucose-Capillary: 84 mg/dL (ref 70–99)
Glucose-Capillary: 172 mg/dL — ABNORMAL HIGH (ref 70–99)
Glucose-Capillary: 186 mg/dL — ABNORMAL HIGH (ref 70–99)

## 2022-03-03 LAB — CBC
HCT: 39.6 % (ref 39.0–52.0)
Hemoglobin: 13 g/dL (ref 13.0–17.0)
MCH: 30.6 pg (ref 26.0–34.0)
MCHC: 32.8 g/dL (ref 30.0–36.0)
MCV: 93.2 fL (ref 80.0–100.0)
Platelets: 156 10*3/uL (ref 150–400)
RBC: 4.25 MIL/uL (ref 4.22–5.81)
RDW: 13.9 % (ref 11.5–15.5)
WBC: 10.5 10*3/uL (ref 4.0–10.5)
nRBC: 0 % (ref 0.0–0.2)

## 2022-03-03 LAB — MAGNESIUM
Magnesium: 1.3 mg/dL — ABNORMAL LOW (ref 1.7–2.4)
Magnesium: 2.3 mg/dL (ref 1.7–2.4)

## 2022-03-03 LAB — PHOSPHORUS: Phosphorus: 3.5 mg/dL (ref 2.5–4.6)

## 2022-03-03 LAB — ABO/RH: ABO/RH(D): A POS

## 2022-03-03 MED ORDER — MAGNESIUM SULFATE 2 GM/50ML IV SOLN: 2.0000 g | Freq: Once | INTRAVENOUS | Status: DC

## 2022-03-03 MED ORDER — MAGNESIUM SULFATE 4 GM/100ML IV SOLN
4.0000 g | Freq: Once | INTRAVENOUS | Status: AC
  Administered 2022-03-03: 4 g via INTRAVENOUS
  Filled 2022-03-03: qty 100

## 2022-03-03 MED ORDER — MAGNESIUM SULFATE 4 GM/100ML IV SOLN: 4.0000 g | Freq: Once | INTRAVENOUS | Status: DC

## 2022-03-03 NOTE — Progress Notes (Signed)
NAME:  Herbert May, MRN:  [id number], DOB:  03-Jul-1940, LOS: 1 ADMISSION DATE:  03/02/2022, CONSULTATION DATE:  03/02/2022 REFERRING MD:  Georgette Dover, CHIEF COMPLAINT:  septic shock   History of Present Illness:   81 year old male transferred from [facility] with septic shock presumed abdominal course.  Presented with worsening back pain yesterday that then became associated with abdominal pain.  His home fent patch + oxycodone did not relieve pain so came to ER.  Labs there show an acute liver injury with mildly cholestatic pattern.  [place] with GB thickening + stones but no Murphy sign and normal CBD.    On some levophed so sent to [facility] for CT scan ([facility] CT scanner down) and further workup.  At time of arrival to [facility] he has been weaned off levophed.  He is asking for food.  His pain is getting better but still present.  Pertinent  Medical History  Former smoker, chronic hypoxic respiratory failure on 3-5L home O2 (at night), COPD (f/b MW in office), CAD w/ stents, prior MI, DMT2, HTN, HLD, GERD, chronic back pain  Significant Hospital Events: Including procedures, antibiotic start and stop dates in addition to other pertinent events   9/4 tx [facility]> [facility], septic shock 9/5 CT Abd-choledocolithiasis > GI and surgery consulted  Interim History / Subjective:   Off pressors, alert and responsive to questions. Still has some RUQ abdominal pain   Objective   Blood pressure 131/62, pulse (!) 118, temperature (!) 100.7 F (38.2 C), temperature source Oral, resp. rate (!) 30, height '5\' 7"'$  (1.702 m), weight 81.1 kg, SpO2 94 %.   Examination: General: NAD, alert and responsive to questions HENT: EOMI, MMM Lungs: No iWOB on 3 L O2, No w/r/c Cardiovascular: no m/r/g, well perfused Abdomen: soft, +Murphy's sign, BS normoactive Extremities: trace edema, warm Neuro: moves all ext  Mag 2.3 from 1.3 CBG 99-137 LFTs AST-152 from 291; ALT-264 from 329 Blood culture from [facility], NGTD per pharmacy team CXR >  no acute cardiopulmonary abnormalities, chronic interstitial lung disease RUQ [place] >   1. Abnormal appearance of the gallbladder with multiple stones, wall thickening and pericholecystic fluid. Findings support acute cholecystitis in the proper clinical setting. 2. Small hyperechoic lesion in the right liver lobe. This is not fully characterized, but suspected to be a hemangioma.  KUB> probable right intrarenal stone CT ABDOMEN PELVIS W WO CONTRAST  Result Date: 03/03/2022 CLINICAL DATA:  Acute nonlocalized abdominal pain. EXAM: CT ABDOMEN AND PELVIS WITHOUT AND WITH CONTRAST TECHNIQUE: Multidetector CT imaging of the abdomen and pelvis was performed following the standard protocol before and following the bolus administration of intravenous contrast. RADIATION DOSE REDUCTION: This exam was performed according to the departmental dose-optimization program which includes automated exposure control, adjustment of the mA and/or kV according to patient size and/or use of iterative reconstruction technique. CONTRAST:  19m OMNIPAQUE IOHEXOL 300 MG/ML  SOLN COMPARISON:  None Available. FINDINGS: Lower chest: Centrilobular emphsyema noted.Calcified and noncalcified micro nodularity noted right lower lobe with areas of subsegmental atelectasis at the right base. Hepatobiliary: Tiny hypodensities in the right liver are too small to characterize but likely benign. Gallstones measure up to 12 mm diameter. Despite the lack of biliary dilatation, an 8 mm stone is identified in the common bile duct (axial 24/6 and coronal 44/12). Pancreas: No focal mass lesion. No dilatation of the main duct. No intraparenchymal cyst. No peripancreatic edema. Spleen: Calcified granulomata. Adrenals/Urinary Tract: No adrenal nodule or mass. 3 nonobstructing stones are  seen in the right kidney measuring up to 10 mm in the lower pole region. Bilateral renal cysts evident. No evidence for hydroureter. The urinary bladder appears normal for  the degree of distention. Stomach/Bowel: Stomach is unremarkable. No gastric wall thickening. No evidence of outlet obstruction. Duodenum is normally positioned as is the ligament of Treitz. Duodenal diverticulum noted. No small bowel wall thickening. No small bowel dilatation. Status post right hemicolectomy. Diverticular changes are noted in the left colon without evidence of diverticulitis. Vascular/Lymphatic: There is moderate atherosclerotic calcification of the abdominal aorta without aneurysm. There is no gastrohepatic or hepatoduodenal ligament lymphadenopathy. No retroperitoneal or mesenteric lymphadenopathy. No pelvic sidewall lymphadenopathy. Reproductive: The prostate gland and seminal vesicles are unremarkable. Other: No intraperitoneal free fluid. 4.4 x 1.0 cm focus of abnormal soft tissue is identified in the midline anterior abdomen, involving the omentum/anterior peritoneum (31/3). Musculoskeletal: No worrisome lytic or sclerotic osseous abnormality. Lumbar fusion hardware evident. 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 Midline wide-mouth hernia contains small bowel without complicating features (93/8). IMPRESSION: 1. Cholelithiasis with choledocholithiasis. 8 mm stone in the common bile duct without biliary dilatation. 2. 4.4 x 1.0 cm focus of soft tissue density in the omentum/peritoneum of the anterior midline abdominal wall. This may be granulation tissue/scarring related to prior surgery, but follow-up recommended to ensure stability, especially if the patient has a cancer history. Consider follow-up CT in 3 months. Alternatively the patient has prior CT imaging of the abdomen, comparison to those older studies to assess chronicity would likely prove helpful. 3. Nonobstructing right nephrolithiasis. 4. Left colonic diverticulosis without diverticulitis. 5. Calcified and noncalcified micro nodularity right lower  lobe, likely sequelae of infection/inflammation or potential prior aspiration. 6. Aortic Atherosclerosis (ICD10-I70.0) and Emphysema (ICD10-J43.9). Electronically Signed   By: Misty Stanley M.D.   On: 03/03/2022 08:20   ECHOCARDIOGRAM COMPLETE  Result Date: 03/02/2022    ECHOCARDIOGRAM REPORT   Patient Name:   KENNTH VANBENSCHOTEN Date of Exam: 03/02/2022 Medical Rec #:  [id number]         Height:       67.0 in Accession #:    [id number]        Weight:       178.8 lb Date of Birth:  1941-03-12          BSA:          1.928 m Patient Age:    40 years          BP:           128/64 mmHg Patient Gender: M                 HR:           78 bpm. Exam Location:  Inpatient Procedure: 2D Echo, Cardiac Doppler, Color Doppler and Intracardiac            Opacification Agent Indications:    Pre-operative evaluation  History:        Patient has prior history of Echocardiogram examinations, most                 recent 09/04/2015. CAD, COPD, Aortic Valve Disease; Risk                 Factors:Hypertension, Dyslipidemia, Former Smoker and Diabetes.                 Shock.  Sonographer:    Clayton Lefort RDCS (  AE) Referring Phys: [id number] Candee Furbish  Sonographer Comments: Suboptimal parasternal window and Technically difficult study due to poor echo windows. Image acquisition challenging due to COPD. IMPRESSIONS  1. Left ventricular ejection fraction, by estimation, is 60 to 65%. The left ventricle has normal function. The left ventricle has no regional wall motion abnormalities. There is moderate left ventricular hypertrophy. Left ventricular diastolic parameters are consistent with Grade I diastolic dysfunction (impaired relaxation).  2. Right ventricular systolic function is normal. The right ventricular size is normal. Tricuspid regurgitation signal is inadequate for assessing PA pressure.  3. The mitral valve is abnormal. Trivial mitral valve regurgitation. Moderate mitral annular calcification.  4. The aortic valve is tricuspid. There is  mild calcification of the aortic valve. Aortic valve regurgitation is not visualized. Mild to moderate aortic valve stenosis. Aortic valve area, by VTI measures 1.47 cm. Aortic valve mean gradient measures 8.0  mmHg. Aortic valve Vmax measures 2.06 m/s.  5. The inferior vena cava is dilated in size with >50% respiratory variability, suggesting right atrial pressure of 8 mmHg. Comparison(s): Changes from prior study are noted. 09/04/2015: LVEF 55-60%, aortic sclerosis without stenosis. FINDINGS  Left Ventricle: Left ventricular ejection fraction, by estimation, is 60 to 65%. The left ventricle has normal function. The left ventricle has no regional wall motion abnormalities. Definity contrast agent was given IV to delineate the left ventricular  endocardial borders. The left ventricular internal cavity size was normal in size. There is moderate left ventricular hypertrophy. Left ventricular diastolic parameters are consistent with Grade I diastolic dysfunction (impaired relaxation). Indeterminate filling pressures. Right Ventricle: The right ventricular size is normal. No increase in right ventricular wall thickness. Right ventricular systolic function is normal. Tricuspid regurgitation signal is inadequate for assessing PA pressure. Left Atrium: Left atrial size was normal in size. Right Atrium: Right atrial size was normal in size. Pericardium: There is no evidence of pericardial effusion. Mitral Valve: The mitral valve is abnormal. Moderate mitral annular calcification. Trivial mitral valve regurgitation. Tricuspid Valve: The tricuspid valve is normal in structure. Tricuspid valve regurgitation is not demonstrated. Aortic Valve: The aortic valve is tricuspid. There is mild calcification of the aortic valve. Aortic valve regurgitation is not visualized. Mild to moderate aortic stenosis is present. Aortic valve mean gradient measures 8.0 mmHg. Aortic valve peak gradient measures 17.0 mmHg. Aortic valve area, by VTI  measures 1.47 cm. Pulmonic Valve: The pulmonic valve was not well visualized. Pulmonic valve regurgitation is not visualized. Aorta: The aortic root and ascending aorta are structurally normal, with no evidence of dilitation. Venous: The inferior vena cava is dilated in size with greater than 50% respiratory variability, suggesting right atrial pressure of 8 mmHg. IAS/Shunts: No atrial level shunt detected by color flow Doppler.  LEFT VENTRICLE PLAX 2D LVIDd:         3.80 cm   Diastology LVIDs:         2.70 cm   LV e' medial:    6.64 cm/s LV PW:         1.60 cm   LV E/e' medial:  13.0 LV IVS:        1.40 cm   LV e' lateral:   10.40 cm/s LVOT diam:     2.20 cm   LV E/e' lateral: 8.3 LV SV:         59 LV SV Index:   31 LVOT Area:     3.80 cm  RIGHT VENTRICLE  IVC RV Basal diam:  3.30 cm     IVC diam: 2.50 cm RV S prime:     13.10 cm/s TAPSE (M-mode): 1.5 cm LEFT ATRIUM             Index        RIGHT ATRIUM           Index LA diam:        3.20 cm 1.66 cm/m   RA Area:     20.20 cm LA Vol (A2C):   54.8 ml 28.42 ml/m  RA Volume:   64.40 ml  33.40 ml/m LA Vol (A4C):   37.9 ml 19.66 ml/m LA Biplane Vol: 49.2 ml 25.52 ml/m  AORTIC VALVE AV Area (Vmax):    1.35 cm AV Area (Vmean):   1.44 cm AV Area (VTI):     1.47 cm AV Vmax:           206.00 cm/s AV Vmean:          135.000 cm/s AV VTI:            0.402 m AV Peak Grad:      17.0 mmHg AV Mean Grad:      8.0 mmHg LVOT Vmax:         73.10 cm/s LVOT Vmean:        51.300 cm/s LVOT VTI:          0.155 m LVOT/AV VTI ratio: 0.39  AORTA Ao Root diam: 3.60 cm Ao Asc diam:  3.50 cm MITRAL VALVE MV Area (PHT): 3.17 cm    SHUNTS MV Decel Time: 239 msec    Systemic VTI:  0.16 m MV E velocity: 86.50 cm/s  Systemic Diam: 2.20 cm MV A velocity: 81.80 cm/s MV E/A ratio:  1.06 Lyman Bishop MD Electronically signed by Lyman Bishop MD Signature Date/Time: 03/02/2022/6:21:49 PM    Final       Resolved Hospital Problem list    Assessment & Plan:  Septic vs. Hypovolemic  shock,resolved- intra-abdominal source, seems to have responded to crystalloid.  Now off pressors.  Choledocholithiasis  Cholelithiasis  Elevated bilirubin - with GB thickening, elevated bili, 8 mm CBD stone and +murphy's sign on exam.   - GI consulted (discussed case with Azucena Freed) - General surgery consulted (discussed case with on call provider) - Zosyn - Blood cxs from [facility] (NGTD) - Trend LFTs - NPO until specialist eval  Hx CAD, stents in place- hold plavix as stents seem to be old.  Allergic to aspirin. Echo EF 60-65% G1DD.   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- continue PTA opiates, hold tylenol w/ acute liver injury  DM- SSI  Best Practice (right click and "Reselect all SmartList Selections" daily)   Diet/type: cardiac DVT prophylaxis: lovenox GI prophylaxis: PPI (PTA) Lines: N/A Foley:  N/A Code Status:  full code Last date of multidisciplinary goals of care discussion [N/A]  Labs   CBC: Recent Labs  Lab 03/03/22 0005  WBC 10.5  HGB 13.0  HCT 39.6  MCV 93.2  PLT 924    Basic Metabolic Panel: Recent Labs  Lab 03/02/22 1634 03/03/22 0005 03/03/22 0555  NA 141 138  --   K 5.0 4.0  --   CL 109 107  --   CO2 23 23  --   GLUCOSE 137* 99  --   BUN 12 12  --   CREATININE 1.20 0.98  --  CALCIUM 8.3* 8.3*  --   MG  --  1.3* 2.3  PHOS  --  3.5  --    GFR: Estimated Creatinine Clearance: 60.3 mL/min (by C-G formula based on SCr of 0.98 mg/dL). Recent Labs  Lab 03/03/22 0005  WBC 10.5    Liver Function Tests: Recent Labs  Lab 03/02/22 1634 03/03/22 0555  AST 291* 152*  ALT 329* 264*  ALKPHOS 97 88  BILITOT 4.1* 4.6*  PROT 5.2* 5.4*  ALBUMIN 2.8* 2.6*   Recent Labs  Lab 03/02/22 1634  LIPASE 18   No results for input(s): "AMMONIA" in the last 168 hours.  ABG No results found for: "PHART", "PCO2ART", "PO2ART", "HCO3", "TCO2", "ACIDBASEDEF", "O2SAT"   Coagulation  Profile: No results for input(s): "INR", "PROTIME" in the last 168 hours.  Cardiac Enzymes: No results for input(s): "CKTOTAL", "CKMB", "CKMBINDEX", "TROPONINI" in the last 168 hours.  HbA1C: Hemoglobin A1C  Date/Time Value Ref Range Status  03/15/2017 12:00 AM 7.2  Final  02/08/2014 12:51 PM 6.4  Final  12/12/2012 11:35 AM 6.1%  Final    Comment:    normal range 4.2-6.3%   Hgb A1c MFr Bld  Date/Time Value Ref Range Status  03/02/2022 04:34 PM 6.1 (H) 4.8 - 5.6 % Final    Comment:    (NOTE) Pre diabetes:          5.7%-6.4%  Diabetes:              >6.4%  Glycemic control for   <7.0% adults with diabetes     CBG: Recent Labs  Lab 03/02/22 1703 03/02/22 2103 03/03/22 0727  GLUCAP 128* 104* 87    Review of Systems:    Positive Symptoms in bold:  Constitutional fevers, chills, weight loss, fatigue, anorexia, malaise  Eyes decreased vision, double vision, eye irritation  Ears, Nose, Mouth, Throat sore throat, trouble swallowing, sinus congestion  Cardiovascular chest pain, paroxysmal nocturnal dyspnea, lower ext edema, palpitations   Respiratory SOB, cough, DOE, hemoptysis, wheezing  Gastrointestinal nausea, vomiting, diarrhea  Genitourinary burning with urination, trouble urinating  Musculoskeletal joint aches, joint swelling, back pain  Integumentary  rashes, skin lesions  Neurological focal weakness, focal numbness, trouble speaking, headaches  Psychiatric depression, anxiety, confusion  Endocrine polyuria, polydipsia, cold intolerance, heat intolerance  Hematologic abnormal bruising, abnormal bleeding, unexplained nose bleeds  Allergic/Immunologic recurrent infections, hives, swollen lymph nodes     Past Medical History:  He,  has a past medical history of Allergy, Arthritis, Asthma, CAD (coronary artery disease) of artery bypass graft (12/27/2012), Colostomy status ([place]), COPD (chronic obstructive pulmonary disease) ([place]), Diabetes mellitus without  complication ([place]), Emphysema of lung ([place]), GERD (gastroesophageal reflux disease), Heart attack ([place]), Hyperlipidemia, Hypertension, and Oxygen deficiency.   Surgical History:   Past Surgical History:  Procedure Laterality Date   BACK SURGERY     cardiac stents     COLOSTOMY     COLOSTOMY CLOSURE     HERNIA REPAIR     KNEE SURGERY     NOSE SURGERY       Social History:   reports that he quit smoking about 31 years ago. His smoking use included cigarettes. He started smoking about 68 years ago. He has a 37.00 pack-year smoking history. He quit smokeless tobacco use about 38 years ago. He reports current alcohol use. He reports that he does not use drugs.   Family History:  His family history includes COPD in his mother; Cancer in his mother, sister, and sister;  Diabetes in his mother.   Allergies Allergies  Allergen Reactions   Aspirin Hives and Itching   Fish Allergy Nausea And Vomiting   Onion Nausea And Vomiting   Other Other (See Comments)    Just doesn't like [place]    Tape Other (See Comments)    Skin very sensitive, easily tears   Levaquin [Levofloxacin In D5w] Rash   Neosporin [Neomycin-Bacitracin Zn-Polymyx] Rash     Home Medications  Prior to Admission medications   Medication Sig Start Date End Date Taking? Authorizing Provider  acyclovir (ZOVIRAX) 800 MG tablet TAKE ONE TABLET BY MOUTH 5 TIMES DAILY FOR 7 DAYS FOR SHINGLES 12/02/20   [provider]  albuterol (PROAIR HFA) 108 (90 BASE) MCG/ACT inhaler Inhale 1 puff into the lungs every 4 (four) hours as needed for wheezing or shortness of breath. 10/31/13   Lysbeth Penner, FNP  albuterol (PROVENTIL) (2.5 MG/3ML) 0.083% nebulizer solution INHALE CONTENTS OF 1 VIAL IN NEBULIZER EVERY 6 HOURS AS NEEDED FOR WHEEZING OR SHORTNESS OF BREATH. 05/06/21   Tanda Rockers, MD  clopidogrel (PLAVIX) 75 MG tablet Take 1 tablet (75 mg total) by mouth daily. 02/21/18   Herminio Commons, MD  fentaNYL (DURAGESIC -  DOSED MCG/HR) 100 MCG/HR Apply one patch to skin every 3 days if tolerated 02/26/16   Mohammed Kindle, MD  fluticasone [facility]) 50 MCG/ACT nasal spray Place 2 sprays into both nostrils daily.    [provider]  Fluticasone-Umeclidin-Vilant 100-62.5-25 MCG/ACT AEPB 1 puff    [provider]  gabapentin (NEURONTIN) 300 MG capsule Take 1 capsule by mouth 3 (three) times daily. 02/19/15   [provider]  ipratropium (ATROVENT) 0.02 % nebulizer solution Take 2.5 mLs (0.5 mg total) by nebulization 4 (four) times daily. 09/05/20   Tanda Rockers, MD  lidocaine (LIDODERM) 5 % 1 patch remove after 12 hours    [provider]  linagliptin (TRADJENTA) 5 MG TABS tablet Take 5 mg by mouth daily. Patient not taking: Reported on 04/17/2021    [provider]  metFORMIN (GLUCOPHAGE) 1000 MG tablet Take 1,000 mg by mouth 2 (two) times daily. 04/02/21   [provider]  methocarbamol (ROBAXIN) 500 MG tablet Take 500 mg by mouth at bedtime. 04/07/21   [provider]  nitroGLYCERIN (NITROSTAT) 0.4 MG SL tablet DISSOLVE ONE TABLET UNDER TONGUE EVERY 5 MINUTES UP TO 3 DOSES AS NEEDED FOR CHEST PAIN 01/08/22   Sueanne Margarita, MD  oxybutynin (DITROPAN-XL) 10 MG 24 hr tablet TAKE ONE TABLET BY MOUTH DAILY FOR BLADDER CONTROL Patient not taking: Reported on 04/17/2021 02/28/21   [provider]  oxyCODONE-acetaminophen (PERCOCET) 10-325 MG tablet Limit one tablet by mouth per day or 2-4 times per day for breakthrough pain while wearing fentanyl patch if tolerated 02/26/16   Mohammed Kindle, MD  pantoprazole (PROTONIX) 40 MG tablet Take 1 tablet (40 mg total) by mouth 2 (two) times daily. 10/31/13   Lysbeth Penner, FNP  TIADYLT ER 120 MG 24 hr capsule TAKE ONE CAPSULE BY MOUTH DAILY 01/12/22   Sueanne Margarita, MD  traZODone (DESYREL) 100 MG tablet TAKE ONE TABLET BY MOUTH AT BEDTIME AS NEEDED FOR SLEEP 03/15/14   Lysbeth Penner, FNP  vitamin B-12  (CYANOCOBALAMIN) 1000 MCG tablet Take 1,000 mcg by mouth daily. 03/17/21   [provider]     Critical care time:

## 2022-03-03 NOTE — Progress Notes (Addendum)
eLink Physician-Brief Progress Note Patient Name: Herbert May DOB: 11/24/1940 MRN: [id number]   Date of Service  03/03/2022  HPI/Events of Note  Mid night labs reviewed. Cr normal, Mag at 1.3 Did not see any Lactate done for sepsis, but now he is off of pressors. VS stable. CT abd/pelvis images reviewed, follow official report.   Discussed with RN. Of of pressors, urinating a lot.   eICU Interventions  - 4 gm IV Mag ordered stat, follow Mag level in AM.      Intervention Category Major Interventions: Sepsis - evaluation and management Intermediate Interventions: Electrolyte abnormality - evaluation and management  Elmer Sow 03/03/2022, 1:49 AM  6:40 AM CT scan , radiology official reporting still pending.

## 2022-03-03 NOTE — Progress Notes (Deleted)
Patient with no urine output. Bladder scan volume 503. Unable to pass foley. ELink notified. Patient has not voided.

## 2022-03-03 NOTE — TOC Initial Note (Signed)
Transition of Care [facility]) - Initial/Assessment Note    Patient Details  Name: Herbert May MRN: [id number] Date of Birth: 1940/09/28  Transition of Care [facility]) CM/SW Contact:    Tom-Johnson, Renea Ee, RN Phone Number: 03/03/2022, 3:42 PM  Clinical Narrative:                  CM spoke with patient at bedside about needs for post hospital transition. Admitted for Shock, requiring Levo. Currently off Levo. On 3L O2. Found to have Cholelithiasis with Choledocholithiasis, GI following. From home with wife, has one daughter. Independent with care.Uses [employer] and [employer] for transportation to and from appointments. Has a cane and grab bars at home. PCP is Leonie Douglas, MD and uses [place] [facility].  No TOC needs or recommendations noted at this time. CM will continue to follow as patient progresses with care.     Expected Discharge Plan: Home/Self Care Barriers to Discharge: Continued Medical Work up   Patient Goals and CMS Choice Patient states their goals for this hospitalization and ongoing recovery are:: To return home CMS Medicare.gov Compare Post Acute Care list provided to:: Patient    Expected Discharge Plan and Services Expected Discharge Plan: Home/Self Care       Living arrangements for the past 2 months: Single Family Home                 DME Arranged: N/A DME Agency: NA                  Prior Living Arrangements/Services Living arrangements for the past 2 months: Single Family Home Lives with:: Spouse Patient language and need for interpreter reviewed:: Yes Do you feel safe going back to the place where you live?: Yes      Need for Family Participation in Patient Care: Yes (Comment) Care giver support system in place?: Yes (comment) Current home services: DME (Cane, grab bars) Criminal Activity/Legal Involvement Pertinent to Current Situation/Hospitalization: No - Comment as needed  Activities of Daily Living      Permission  Sought/Granted Permission sought to share information with : Family Supports Permission granted to share information with : Yes, Verbal Permission Granted              Emotional Assessment Appearance:: Appears stated age Attitude/Demeanor/Rapport: Engaged, Gracious Affect (typically observed): Accepting, Appropriate, Calm, Hopeful, Pleasant Orientation: : Oriented to Self, Oriented to Place, Oriented to  Time, Oriented to Situation Alcohol / Substance Use: Not Applicable Psych Involvement: No (comment)  Admission diagnosis:  Shock [facility]) [R57.9] Patient Active Problem List   Diagnosis Date Noted   Shock ([place]) 03/02/2022   DOE (dyspnea on exertion) 05/13/2018   Hypogonadism, male 04/28/2017   Uncontrolled type 2 diabetes mellitus with hyperglycemia ([place]) 04/28/2017   Chronic respiratory failure ([place]) 07/31/2016   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   DDD (degenerative disc disease), lumbar 11/28/2015   Facet syndrome, lumbar 11/28/2015   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/28/2015   H/O cervical spine surgery 11/28/2015   History of lumbar surgery 11/28/2015   H/O knee surgery 11/28/2015   Morbid obesity due to excess calories/ complicated by hyperlipidemia/ hbp/dm 02/27/2015   COPD GOLD II  01/25/2015   Chronic respiratory failure with hypoxia ([place]) 01/25/2015   Dyspnea 01/24/2015   Anemia 01/09/2013   CAD (coronary artery disease) of artery bypass graft 12/27/2012   Aortic stenosis 12/27/2012   HTN (hypertension) 12/27/2012   Dyslipidemia 12/27/2012   PCP:  Leonie Douglas, MD Pharmacy:   [facility] [address], [place] [address] Phone: [phone] Fax: [phone]     Social Determinants of Health (SDOH) Interventions    Readmission Risk Interventions     No data to display

## 2022-03-03 NOTE — Consult Note (Addendum)
Consultation  Referring Provider:   [facility] Primary Care Physician:  Leonie Douglas, MD Primary Gastroenterologist:  Althia Forts   [facility] 2013)    Reason for Consultation:     Sepsis, choledocholithiasis   Impression    Septic shock versus hypovolemic shock likey intraabdominal source 03/03/2022 WBC 10.5  Blood pressure 131/62, pulse (!) 118, temperature (!) 100.7 F (38.2 C), temperature source Oral, resp. rate (!) 30, height '5\' 7"'$  (1.702 m), weight 81.1 kg, SpO2 94 %. Weaned off Levophed, responded well to fluids No elevated white blood cell count. Still mild temperature, tachycardic.  Choledocholithiasis/ acute cholangitis WBC 10.5 HGB 13.0 Platelets 156 AST 152 ALT 264  Alkphos 88 TBili 4.6 CT :with contrast showed cholelithiasis with choledocholithiasis 8 mm stone in common bile duct without biliary dilatation. [place]: showed gallbladder wall thickening with stones normal CBD. No elevated white blood cell count, LFTs downtrending, possibly responding to IV fluids and IV Zosyn.  Coronary artery disease Patient was on Plavix outpatient has allergy to aspirin, will to tolerate NSAIDs per patient..  Follows Dr. Radford Pax. Plavix on hold last dose 03/01/2022 in the AM  COPD with history of chronic respiratory failure on oxygen at night No flare currently.  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   Plan   - Daily CBC, CMET -Continue supportive care -Continue pain control. -Continue IV hydration, can do full liquids today, NPO 09/07 - continue zosyn -Plan for ERCP- patient is on plavix, last dose 09/03 will plan for ERCP 09/07, continue to hold plavix -Will need to hold VTE for at least 6 hours prior to procedure- currently on lovenox 40 mg daily, will need to not give 09/07 -I thoroughly  discussed procedure with the patient to include nature, alternatives, benefits, and risks (including but not limited to post ERCP pancreatitis, bleeding, infection, perforation, anesthesia/cardiac pulmonary complications). Discussed risk of pancreatitis associated with ERCP. Patient verbalized understanding and gave verbal consent to proceed with ERCP. Patient has ASA allergy with hives, can consider diclofenac instead to help prevent pancreatitis Likely benefit from surgery consult this admission  Thank you for your kind consultation, we will continue to follow.         HPI:   Herbert May is a 81 y.o. male with past medical history significant for chronic hypoxic respiratory failure on 3 to 5 L oxygen at home at night, COPD, coronary artery disease status post stents x3 follows with Dr. Radford Pax, type 2 diabetes, hypertension hyperlipidemia, GERD, possible Barrett's esophagus from previous GI note 2013 transferred from [facility] with septic shock.  Presented with worsening back pain and abdominal pain in the ER. Fentanyl patch and oxycodone not relieving pain. Was on Levophed has since been weaned off after being transferred here. Labs showed acute liver injury with mild cholestatic pattern, AST 291, ALT 329, total bilirubin 4.1 admission.   Most recent AST showed 152, ALT 264, total bilirubin 4.6. WBC 10.5, hemoglobin 13 Patient currently on Zosyn and Lovenox 40 mg every 24 hours.  Abdominal ultrasound showed gallbladder wall thickening with stones normal CBD. CT abdomen pelvis with contrast showed cholelithiasis with choledocholithiasis 8 mm stone in common bile duct without biliary dilatation.  4 4 x 1 cm focus soft tissue density in omentum anterior midline, possible scarring related to prior surgery  recommend repeat CT 3 months.  Nonobstructing right nephrolithiasis, diverticulosis without diverticulitis micronodularity right lower lobe  Patient states he has never had pain like  this prior.  Started with back pain and abdominal pain severe, denied any nausea, vomiting, fever or chills. Denies family history of gallbladder issues or disease. Aspirins listed on patient's allergy list, states he gets hives.  But does state he can take NSAIDs and has taken Aleve and ibuprofen without issues in the past. Patient states he continues to have abdominal pain but has improved.  He is requesting a coffee and food.  Denies nausea and vomiting. Very alert, knows what brought him here and where he is at.   Abnormal ED labs: Abnormal Labs Reviewed  COMPREHENSIVE METABOLIC PANEL - Abnormal; Notable for the following components:      Result Value   Glucose, Bld 137 (*)    Calcium 8.3 (*)    Total Protein 5.2 (*)    Albumin 2.8 (*)    AST 291 (*)    ALT 329 (*)    Total Bilirubin 4.1 (*)    All other components within normal limits  HEMOGLOBIN A1C - Abnormal; Notable for the following components:   Hgb A1c MFr Bld 6.1 (*)    All other components within normal limits  BASIC METABOLIC PANEL - Abnormal; Notable for the following components:   Calcium 8.3 (*)    All other components within normal limits  MAGNESIUM - Abnormal; Notable for the following components:   Magnesium 1.3 (*)    All other components within normal limits  GLUCOSE, CAPILLARY - Abnormal; Notable for the following components:   Glucose-Capillary 128 (*)    All other components within normal limits  GLUCOSE, CAPILLARY - Abnormal; Notable for the following components:   Glucose-Capillary 104 (*)    All other components within normal limits  HEPATIC FUNCTION PANEL - Abnormal; Notable for the following components:   Total Protein 5.4 (*)    Albumin 2.6 (*)    AST 152 (*)    ALT 264 (*)    Total Bilirubin 4.6 (*)    Bilirubin, Direct 3.0 (*)    Indirect Bilirubin 1.6 (*)    All other components within normal limits     Past Medical History:  Diagnosis Date   Allergy    Arthritis    Asthma    CAD  (coronary artery disease) of artery bypass graft 12/27/2012   Colostomy status (HCC)    COPD (chronic obstructive pulmonary disease) ([place])    Diabetes mellitus without complication (HCC)    Emphysema of lung (HCC)    GERD (gastroesophageal reflux disease)    Heart attack ([place])    Hyperlipidemia    Hypertension    Oxygen deficiency     Surgical History:  He  has a past surgical history that includes Colostomy; Nose surgery; Back surgery; Hernia repair; Knee surgery; cardiac stents; and Colostomy closure. Family History:  His family history includes COPD in his mother; Cancer in his mother, sister, and sister; Diabetes in his mother. Social History:   reports that he quit smoking about 31 years ago. His smoking use included cigarettes. He started smoking about 68 years ago. He has a 37.00 pack-year smoking history. He quit smokeless tobacco use about 38 years ago. He reports current alcohol use. He reports that he does not use drugs.  Prior to Admission medications   Medication Sig Start Date End Date Taking? Authorizing Provider  albuterol (PROVENTIL) (2.5 MG/3ML)  0.083% nebulizer solution INHALE CONTENTS OF 1 VIAL IN NEBULIZER EVERY 6 HOURS AS NEEDED FOR WHEEZING OR SHORTNESS OF BREATH. Patient taking differently: Take 2.5 mg by nebulization every 6 (six) hours as needed for shortness of breath or wheezing. 05/06/21  Yes Tanda Rockers, MD  albuterol (VENTOLIN HFA) 108 (90 Base) MCG/ACT inhaler Inhale 2 puffs into the lungs every 6 (six) hours as needed for wheezing or shortness of breath.   Yes [provider]  clopidogrel (PLAVIX) 75 MG tablet Take 1 tablet (75 mg total) by mouth daily. 02/21/18  Yes Herminio Commons, MD  Cyanocobalamin (VITAMIN B-12 PO) Take 1 tablet by mouth daily.   Yes [provider]  fentaNYL (DURAGESIC - DOSED MCG/HR) 100 MCG/HR Apply one patch to skin every 3 days if tolerated Patient taking differently: 1 patch every 3 (three) days. 02/26/16   Yes Mohammed Kindle, MD  fentaNYL Citrate (SUBLIMAZE IJ) Inject 12.5 mg as directed once.   Yes [provider]  fluticasone (FLONASE) 50 MCG/ACT nasal spray Place 2 sprays into both nostrils daily.   Yes [provider]  gabapentin (NEURONTIN) 100 MG capsule Take 200 mg by mouth 2 (two) times daily. 02/06/22  Yes [provider]  ibuprofen (ADVIL) 600 MG tablet Take 600 mg by mouth once.   Yes [provider]  ipratropium-albuterol (DUONEB) 0.5-2.5 (3) MG/3ML SOLN Take 3 mLs by nebulization once.   Yes [provider]  lidocaine (LIDODERM) 5 % Place 1 patch onto the skin daily as needed (pain).   Yes [provider]  metFORMIN (GLUCOPHAGE) 1000 MG tablet Take 1,000 mg by mouth at bedtime.   Yes [provider]  methocarbamol (ROBAXIN) 500 MG tablet Take 500 mg by mouth at bedtime as needed for muscle spasms. 04/07/21  Yes [provider]  metoprolol succinate (TOPROL-XL) 25 MG 24 hr tablet Take 25 mg by mouth at bedtime. 02/25/22  Yes [provider]  midodrine (PROAMATINE) 5 MG tablet Take 5 mg by mouth 2 (two) times daily.   Yes [provider]  nitroGLYCERIN (NITROSTAT) 0.4 MG SL tablet Place 0.4 mg under the tongue every 5 (five) minutes as needed for chest pain.   Yes [provider]  Norepinephrine-Dextrose 8-5 MG/250ML-% SOLN Inject 250 mLs into the vein continuous. Norepinephrine 8 mg in dextrose 5%, 250 ml infusion (32 mcg/ml) at 9.375 ml/hr (5 mcg/min)   Yes [provider]  oxybutynin (DITROPAN-XL) 10 MG 24 hr tablet Take 10 mg by mouth at bedtime.   Yes [provider]  oxyCODONE-acetaminophen (PERCOCET) 10-325 MG tablet Take 1 tablet by mouth every 6 (six) hours as needed for pain.   Yes [provider]  pantoprazole (PROTONIX) 40 MG tablet Take 1 tablet (40 mg total) by mouth 2 (two) times daily. Patient taking differently: Take 40 mg by mouth at bedtime. 10/31/13   Yes Lysbeth Penner, FNP  piperacillin-tazobactam (ZOSYN) 4-0.5 GM/100ML SOLN IVPB Inject 4.5 g into the vein See admin instructions. Zosyn 4.5 gram in 100 ml normal saline 0.9% at 200 ml/hr - ONCE   Yes [provider]  potassium chloride SA (KLOR-CON M) 20 MEQ tablet Take 20 mEq by mouth daily. 12/18/21  Yes [provider]  pravastatin (PRAVACHOL) 40 MG tablet Take 40 mg by mouth at bedtime. 02/26/22  Yes [provider]  sodium chloride 0.9 % Inject 2,436 mLs into the vein See admin instructions. Sodium Chloride 0.9% bolus, 2436 ml over 1 hour for  1 dose.   Yes [provider]  tamsulosin (FLOMAX) 0.4 MG CAPS capsule Take 0.4 mg by mouth at bedtime. 02/06/22  Yes [provider]  traZODone (DESYREL) 100 MG tablet TAKE ONE TABLET BY MOUTH AT BEDTIME AS NEEDED FOR SLEEP Patient taking differently: Take 100 mg by mouth at bedtime as needed for sleep. 03/15/14  Yes Lysbeth Penner, FNP  diltiazem (TIADYLT ER) 120 MG 24 hr capsule Take 120 mg by mouth daily. Patient not taking: Reported on 03/03/2022    [provider]    Current Facility-Administered Medications  Medication Dose Route Frequency Provider Last Rate Last Admin   0.9 %  sodium chloride infusion   Intravenous PRN Candee Furbish, MD 10 mL/hr at 03/03/22 0600 Infusion Verify at 03/03/22 0600   albuterol (PROVENTIL) (2.5 MG/3ML) 0.083% nebulizer solution 2.5 mg  2.5 mg Nebulization Q6H PRN Candee Furbish, MD       Chlorhexidine Gluconate Cloth 2 % PADS 6 each  6 each Topical Daily Candee Furbish, MD   6 each at 03/03/22 2355   cyanocobalamin (VITAMIN B12) tablet 1,000 mcg  1,000 mcg Oral Daily Candee Furbish, MD   1,000 mcg at 03/03/22 0915   docusate sodium (COLACE) capsule 100 mg  100 mg Oral BID PRN Candee Furbish, MD       enoxaparin (LOVENOX) injection 40 mg  40 mg Subcutaneous Q24H Candee Furbish, MD       fluticasone [facility]) 50 MCG/ACT nasal spray 2 spray  2 spray Each  Nare Daily Candee Furbish, MD   2 spray at 03/03/22 0915   fluticasone furoate-vilanterol (BREO ELLIPTA) 100-25 MCG/ACT 1 puff  1 puff Inhalation Daily Candee Furbish, MD   1 puff at 03/03/22 0755   And   umeclidinium bromide (INCRUSE ELLIPTA) 62.5 MCG/ACT 1 puff  1 puff Inhalation Daily Candee Furbish, MD   1 puff at 03/03/22 0755   gabapentin (NEURONTIN) capsule 300 mg  300 mg Oral TID Candee Furbish, MD   300 mg at 03/03/22 0915   insulin aspart (novoLOG) injection 0-15 Units  0-15 Units Subcutaneous TID WC Candee Furbish, MD   2 Units at 03/02/22 1741   lidocaine (LIDODERM) 5 % 1 patch  1 patch Transdermal Q24H Candee Furbish, MD   1 patch at 03/02/22 1735   methocarbamol (ROBAXIN) tablet 500 mg  500 mg Oral QHS Candee Furbish, MD   500 mg at 03/02/22 2209   ondansetron (ZOFRAN) injection 4 mg  4 mg Intravenous Q6H PRN Candee Furbish, MD   4 mg at 03/02/22 2000   Oral care mouth rinse  15 mL Mouth Rinse PRN Candee Furbish, MD       oxyCODONE (Oxy IR/ROXICODONE) immediate release tablet 10 mg  10 mg Oral Q6H PRN Candee Furbish, MD   10 mg at 03/03/22 0804   pantoprazole (PROTONIX) EC tablet 40 mg  40 mg Oral BID Candee Furbish, MD   40 mg at 03/03/22 0915   piperacillin-tazobactam (ZOSYN) IVPB 3.375 g  3.375 g Intravenous Q8H Candee Furbish, MD 12.5 mL/hr at 03/03/22 0914 3.375 g at 03/03/22 0914   polyethylene glycol (MIRALAX / GLYCOLAX) packet 17 g  17 g Oral Daily PRN Candee Furbish, MD       traZODone (DESYREL) tablet 100 mg  100 mg Oral QHS PRN Candee Furbish, MD        Allergies  as of 03/02/2022 - Review Complete 04/17/2021  Allergen Reaction Noted   Aspirin Hives and Itching 06/30/2012   Fish allergy Nausea And Vomiting 05/01/2012   Latex Other (See Comments) 05/01/2012   Onion Nausea And Vomiting 05/01/2012   Other Other (See Comments) 05/01/2012   Levaquin [levofloxacin in d5w] Rash 05/01/2012   Neosporin [neomycin-bacitracin zn-polymyx] Rash 05/01/2012    Review  of Systems:    Constitutional: No weight loss, fever, chills, weakness or fatigue HEENT: Eyes: No change in vision               Ears, Nose, Throat:  No change in hearing or congestion Skin: No rash or itching Cardiovascular: No chest pain, chest pressure or palpitations   Respiratory: No SOB or cough Gastrointestinal: See HPI and otherwise negative Genitourinary: No dysuria or change in urinary frequency Neurological: No headache, dizziness or syncope Musculoskeletal: No new muscle or joint pain Hematologic: No bleeding or bruising Psychiatric: No history of depression or anxiety     Physical Exam:  Vital signs in last 24 hours: Temp:  [97.9 F (36.6 C)-100.7 F (38.2 C)] 100.7 F (38.2 C) (09/05 0728) Pulse Rate:  [71-137] 118 (09/05 0805) Resp:  [18-43] 30 (09/05 0805) BP: (103-152)/(48-89) 131/62 (09/05 0805) SpO2:  [90 %-97 %] 94 % (09/05 0805) Weight:  [81.1 kg] 81.1 kg (09/04 1545) Last BM Date : 03/02/22 Last BM recorded by nurses in past 5 days Stool Type: Type 4 (Like a smooth, soft sausage or snake) (03/03/2022  5:11 AM)  General:   Pleasant, obese male in no acute distress Head:  Normocephalic and atraumatic. Eyes: scleral icterus,conjunctive icteric  Heart: Tachycardic, no murmurs rubs or gallops. Pulm: Patient with cough, diffuse decreased breath sounds but no wheezing or rhonchi.  Not on oxygen currently. Abdomen:  Soft, Obese AB, large healed ventral scar with ventral hernia, Sluggish bowel sounds. mild tenderness in the RUQ. Without guarding and Without rebound, No organomegaly appreciated. Extremities:  With edema. Msk:  Symmetrical without gross deformities. Peripheral pulses intact.  Neurologic:  Alert and  oriented x4;  No focal deficits.  Skin:   Dry and intact without significant lesions or rashes. Psychiatric:  Cooperative. Normal mood and affect.  LAB RESULTS: Recent Labs    03/03/22 0005  WBC 10.5  HGB 13.0  HCT 39.6  PLT 156   BMET Recent  Labs    03/02/22 1634 03/03/22 0005  NA 141 138  K 5.0 4.0  CL 109 107  CO2 23 23  GLUCOSE 137* 99  BUN 12 12  CREATININE 1.20 0.98  CALCIUM 8.3* 8.3*   LFT Recent Labs    03/03/22 0555  PROT 5.4*  ALBUMIN 2.6*  AST 152*  ALT 264*  ALKPHOS 88  BILITOT 4.6*  BILIDIR 3.0*  IBILI 1.6*   PT/INR No results for input(s): "LABPROT", "INR" in the last 72 hours.  STUDIES: CT ABDOMEN PELVIS W WO CONTRAST  Result Date: 03/03/2022 CLINICAL DATA:  Acute nonlocalized abdominal pain. EXAM: CT ABDOMEN AND PELVIS WITHOUT AND WITH CONTRAST TECHNIQUE: Multidetector CT imaging of the abdomen and pelvis was performed following the standard protocol before and following the bolus administration of intravenous contrast. RADIATION DOSE REDUCTION: This exam was performed according to the departmental dose-optimization program which includes automated exposure control, adjustment of the mA and/or kV according to patient size and/or use of iterative reconstruction technique. CONTRAST:  127m OMNIPAQUE IOHEXOL 300 MG/ML  SOLN COMPARISON:  None Available. FINDINGS: Lower chest: Centrilobular emphsyema noted.Calcified  and noncalcified micro nodularity noted right lower lobe with areas of subsegmental atelectasis at the right base. Hepatobiliary: Tiny hypodensities in the right liver are too small to characterize but likely benign. Gallstones measure up to 12 mm diameter. Despite the lack of biliary dilatation, an 8 mm stone is identified in the common bile duct (axial 24/6 and coronal 44/12). Pancreas: No focal mass lesion. No dilatation of the main duct. No intraparenchymal cyst. No peripancreatic edema. Spleen: Calcified granulomata. Adrenals/Urinary Tract: No adrenal nodule or mass. 3 nonobstructing stones are seen in the right kidney measuring up to 10 mm in the lower pole region. Bilateral renal cysts evident. No evidence for hydroureter. The urinary bladder appears normal for the degree of distention.  Stomach/Bowel: Stomach is unremarkable. No gastric wall thickening. No evidence of outlet obstruction. Duodenum is normally positioned as is the ligament of Treitz. Duodenal diverticulum noted. No small bowel wall thickening. No small bowel dilatation. Status post right hemicolectomy. Diverticular changes are noted in the left colon without evidence of diverticulitis. Vascular/Lymphatic: There is moderate atherosclerotic calcification of the abdominal aorta without aneurysm. There is no gastrohepatic or hepatoduodenal ligament lymphadenopathy. No retroperitoneal or mesenteric lymphadenopathy. No pelvic sidewall lymphadenopathy. Reproductive: The prostate gland and seminal vesicles are unremarkable. Other: No intraperitoneal free fluid. 4.4 x 1.0 cm focus of abnormal soft tissue is identified in the midline anterior abdomen, involving the omentum/anterior peritoneum (31/3). Musculoskeletal: No worrisome lytic or sclerotic osseous abnormality. Lumbar fusion hardware evident. 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 Midline wide-mouth hernia contains small bowel without complicating features (61/4). IMPRESSION: 1. Cholelithiasis with choledocholithiasis. 8 mm stone in the common bile duct without biliary dilatation. 2. 4.4 x 1.0 cm focus of soft tissue density in the omentum/peritoneum of the anterior midline abdominal wall. This may be granulation tissue/scarring related to prior surgery, but follow-up recommended to ensure stability, especially if the patient has a cancer history. Consider follow-up CT in 3 months. Alternatively the patient has prior CT imaging of the abdomen, comparison to those older studies to assess chronicity would likely prove helpful. 3. Nonobstructing right nephrolithiasis. 4. Left colonic diverticulosis without diverticulitis. 5. Calcified and noncalcified micro nodularity right lower lobe, likely sequelae of  infection/inflammation or potential prior aspiration. 6. Aortic Atherosclerosis (ICD10-I70.0) and Emphysema (ICD10-J43.9). Electronically Signed   By: Misty Stanley M.D.   On: 03/03/2022 08:20   ECHOCARDIOGRAM COMPLETE  Result Date: 03/02/2022    ECHOCARDIOGRAM REPORT   Patient Name:   Herbert May Date of Exam: 03/02/2022 Medical Rec #:  [id number]         Height:       67.0 in Accession #:    [id number]        Weight:       178.8 lb Date of Birth:  11-16-1940          BSA:          1.928 m Patient Age:    4 years          BP:           128/64 mmHg Patient Gender: M                 HR:           78 bpm. Exam Location:  Inpatient Procedure: 2D Echo, Cardiac Doppler, Color Doppler and Intracardiac            Opacification Agent Indications:  Pre-operative evaluation  History:        Patient has prior history of Echocardiogram examinations, most                 recent 09/04/2015. CAD, COPD, Aortic Valve Disease; Risk                 Factors:Hypertension, Dyslipidemia, Former Smoker and Diabetes.                 Shock.  Sonographer:    Clayton Lefort RDCS (AE) Referring Phys: [id number] Candee Furbish  Sonographer Comments: Suboptimal parasternal window and Technically difficult study due to poor echo windows. Image acquisition challenging due to COPD. IMPRESSIONS  1. Left ventricular ejection fraction, by estimation, is 60 to 65%. The left ventricle has normal function. The left ventricle has no regional wall motion abnormalities. There is moderate left ventricular hypertrophy. Left ventricular diastolic parameters are consistent with Grade I diastolic dysfunction (impaired relaxation).  2. Right ventricular systolic function is normal. The right ventricular size is normal. Tricuspid regurgitation signal is inadequate for assessing PA pressure.  3. The mitral valve is abnormal. Trivial mitral valve regurgitation. Moderate mitral annular calcification.  4. The aortic valve is tricuspid. There is mild calcification of the  aortic valve. Aortic valve regurgitation is not visualized. Mild to moderate aortic valve stenosis. Aortic valve area, by VTI measures 1.47 cm. Aortic valve mean gradient measures 8.0  mmHg. Aortic valve Vmax measures 2.06 m/s.  5. The inferior vena cava is dilated in size with >50% respiratory variability, suggesting right atrial pressure of 8 mmHg. Comparison(s): Changes from prior study are noted. 09/04/2015: LVEF 55-60%, aortic sclerosis without stenosis. FINDINGS  Left Ventricle: Left ventricular ejection fraction, by estimation, is 60 to 65%. The left ventricle has normal function. The left ventricle has no regional wall motion abnormalities. Definity contrast agent was given IV to delineate the left ventricular  endocardial borders. The left ventricular internal cavity size was normal in size. There is moderate left ventricular hypertrophy. Left ventricular diastolic parameters are consistent with Grade I diastolic dysfunction (impaired relaxation). Indeterminate filling pressures. Right Ventricle: The right ventricular size is normal. No increase in right ventricular wall thickness. Right ventricular systolic function is normal. Tricuspid regurgitation signal is inadequate for assessing PA pressure. Left Atrium: Left atrial size was normal in size. Right Atrium: Right atrial size was normal in size. Pericardium: There is no evidence of pericardial effusion. Mitral Valve: The mitral valve is abnormal. Moderate mitral annular calcification. Trivial mitral valve regurgitation. Tricuspid Valve: The tricuspid valve is normal in structure. Tricuspid valve regurgitation is not demonstrated. Aortic Valve: The aortic valve is tricuspid. There is mild calcification of the aortic valve. Aortic valve regurgitation is not visualized. Mild to moderate aortic stenosis is present. Aortic valve mean gradient measures 8.0 mmHg. Aortic valve peak gradient measures 17.0 mmHg. Aortic valve area, by VTI measures 1.47 cm. Pulmonic  Valve: The pulmonic valve was not well visualized. Pulmonic valve regurgitation is not visualized. Aorta: The aortic root and ascending aorta are structurally normal, with no evidence of dilitation. Venous: The inferior vena cava is dilated in size with greater than 50% respiratory variability, suggesting right atrial pressure of 8 mmHg. IAS/Shunts: No atrial level shunt detected by color flow Doppler.  LEFT VENTRICLE PLAX 2D LVIDd:         3.80 cm   Diastology LVIDs:         2.70 cm   LV e' medial:  6.64 cm/s LV PW:         1.60 cm   LV E/e' medial:  13.0 LV IVS:        1.40 cm   LV e' lateral:   10.40 cm/s LVOT diam:     2.20 cm   LV E/e' lateral: 8.3 LV SV:         59 LV SV Index:   31 LVOT Area:     3.80 cm  RIGHT VENTRICLE             IVC RV Basal diam:  3.30 cm     IVC diam: 2.50 cm RV S prime:     13.10 cm/s TAPSE (M-mode): 1.5 cm LEFT ATRIUM             Index        RIGHT ATRIUM           Index LA diam:        3.20 cm 1.66 cm/m   RA Area:     20.20 cm LA Vol (A2C):   54.8 ml 28.42 ml/m  RA Volume:   64.40 ml  33.40 ml/m LA Vol (A4C):   37.9 ml 19.66 ml/m LA Biplane Vol: 49.2 ml 25.52 ml/m  AORTIC VALVE AV Area (Vmax):    1.35 cm AV Area (Vmean):   1.44 cm AV Area (VTI):     1.47 cm AV Vmax:           206.00 cm/s AV Vmean:          135.000 cm/s AV VTI:            0.402 m AV Peak Grad:      17.0 mmHg AV Mean Grad:      8.0 mmHg LVOT Vmax:         73.10 cm/s LVOT Vmean:        51.300 cm/s LVOT VTI:          0.155 m LVOT/AV VTI ratio: 0.39  AORTA Ao Root diam: 3.60 cm Ao Asc diam:  3.50 cm MITRAL VALVE MV Area (PHT): 3.17 cm    SHUNTS MV Decel Time: 239 msec    Systemic VTI:  0.16 m MV E velocity: 86.50 cm/s  Systemic Diam: 2.20 cm MV A velocity: 81.80 cm/s MV E/A ratio:  1.06 Lyman Bishop MD Electronically signed by Lyman Bishop MD Signature Date/Time: 03/02/2022/6:21:49 PM    Final      Vladimir Crofts  03/03/2022, 10:09 AM   Attending physician's note  I have taken a history, reviewed the  chart and examined the patient. I performed a substantive portion of this encounter, including complete performance of at least one of the key components, in conjunction with the APP. I agree with the APP's note, impression and recommendations.    81 year old very pleasant gentleman admitted with septic shock, possible source ascending cholangitis.  He has a 8 mm stone lodged in the CBD.  LFT pattern consistent with biliary obstruction T. bili 4.6 and mild elevation in transaminases  He is currently hemodynamically stable on broad-spectrum antibiotics  He was on Plavix for CAD, last dose March 01, 2022 Dr. Fuller Plan is planning to proceed with ERCP tentatively on September 7 after Plavix washout for 4-5 days Advance diet as tolerated Continue antibiotics Surgery consult for possible cholecystectomy to prevent recurrent episode  GI will continue to follow along   The patient was provided an opportunity to ask questions and all were answered.  The patient agreed with the plan and demonstrated an understanding of the instructions.  Damaris Hippo , MD [phone]

## 2022-03-03 NOTE — Consult Note (Addendum)
Herbert May July 09, 1940  [id number].    Requesting MD: Dr. Marshell Garfinkel Chief Complaint/Reason for Consult: Choledocholithiasis/Acute cholangitis  HPI: Herbert May is a 81 y.o. male with a hx of CAD s/p stents x 3 on Plavix (last dose 03/01/22 per chart), COPD on 3-5L o2 at night, DM2, HTN, HLD and GERD who presented to [facility] with abdominal pain. I cannot see the providers notes in care everywhere for this encounter. History taken from chart review and patients account. Patient reports acute onset back pain radiating to his epigastrium on the night of presentation. No n/v. No hx of similar pain. Denies hx of problems with his GB in the past. US showed abnormal appearance of the gallbladder with multiple stones, wall thickening and pericholecystic fluid concerning for Acute Cholecystitis. Was found to be in septic shock requiring Levo so was transferred to [facility]. Scan here showed Cholelithiasis with choledocholithiasis. 8 mm stone in the common bile duct without biliary dilatation. Labs showed AST 291 > 152, ALT 329 > 152, total bilirubin 4.1 > 4.6. Alk phos and lipase has been wnl. WBC 10.5. He is currently on IV Zosyn. He has been weaned off Levo. GI planning ERCP. Reports hx of prior colectomy for perforated colon > 25 years ago in [place]. Sounds like he had an ostomy from this that was reversed. Later had a repair of hernia with mesh placement. 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 Tally Due at [facility].   ROS: ROS As above, see HPI  Family History  Problem Relation Age of Onset   Cancer Mother    Diabetes Mother    COPD Mother    Cancer Sister        brain   Cancer Sister        brain    Past Medical History:  Diagnosis Date   Allergy    Arthritis    Asthma    CAD (coronary artery disease) of artery bypass graft 12/27/2012   Colostomy status (HCC)    COPD (chronic obstructive pulmonary disease) ([place])     Diabetes mellitus without complication (HCC)    Emphysema of lung (HCC)    GERD (gastroesophageal reflux disease)    Heart attack ([place])    Hyperlipidemia    Hypertension    Oxygen deficiency     Past Surgical History:  Procedure Laterality Date   BACK SURGERY     cardiac stents     COLOSTOMY     COLOSTOMY CLOSURE     HERNIA REPAIR     KNEE SURGERY     NOSE SURGERY      Social History:  reports that he quit smoking about 31 years ago. His smoking use included cigarettes. He started smoking about 68 years ago. He has a 37.00 pack-year smoking history. He quit smokeless tobacco use about 38 years ago. He reports current alcohol use. He reports that he does not use drugs. Lives at home with his wife Quit smoking over 30 years ago Drinks occasional.   Allergies:  Allergies  Allergen Reactions   Aspirin Hives and Itching   Fish Allergy Nausea And Vomiting   Onion Nausea And Vomiting   Other Other (See Comments)    Just doesn't like [place]    Tape Other (See Comments)    Skin very sensitive, easily tears   Levaquin [Levofloxacin In D5w] Rash   Neosporin [Neomycin-Bacitracin Zn-Polymyx] Rash    Medications Prior  to Admission  Medication Sig Dispense Refill   albuterol (PROVENTIL) (2.5 MG/3ML) 0.083% nebulizer solution INHALE CONTENTS OF 1 VIAL IN NEBULIZER EVERY 6 HOURS AS NEEDED FOR WHEEZING OR SHORTNESS OF BREATH. (Patient taking differently: Take 2.5 mg by nebulization every 6 (six) hours as needed for shortness of breath or wheezing.) 360 mL 3   albuterol (VENTOLIN HFA) 108 (90 Base) MCG/ACT inhaler Inhale 2 puffs into the lungs every 6 (six) hours as needed for wheezing or shortness of breath.     clopidogrel (PLAVIX) 75 MG tablet Take 1 tablet (75 mg total) by mouth daily. 90 tablet 3   Cyanocobalamin (VITAMIN B-12 PO) Take 1 tablet by mouth daily.     fentaNYL (DURAGESIC - DOSED MCG/HR) 100 MCG/HR Apply one patch to skin every 3 days if tolerated (Patient taking  differently: 1 patch every 3 (three) days.) 10 patch 0   fentaNYL Citrate (SUBLIMAZE IJ) Inject 12.5 mg as directed once.     fluticasone (FLONASE) 50 MCG/ACT nasal spray Place 2 sprays into both nostrils daily.     gabapentin (NEURONTIN) 100 MG capsule Take 200 mg by mouth 2 (two) times daily.     ibuprofen (ADVIL) 600 MG tablet Take 600 mg by mouth once.     ipratropium-albuterol (DUONEB) 0.5-2.5 (3) MG/3ML SOLN Take 3 mLs by nebulization once.     lidocaine (LIDODERM) 5 % Place 1 patch onto the skin daily as needed (pain).     metFORMIN (GLUCOPHAGE) 1000 MG tablet Take 1,000 mg by mouth at bedtime.     methocarbamol (ROBAXIN) 500 MG tablet Take 500 mg by mouth at bedtime as needed for muscle spasms.     metoprolol succinate (TOPROL-XL) 25 MG 24 hr tablet Take 25 mg by mouth at bedtime.     midodrine (PROAMATINE) 5 MG tablet Take 5 mg by mouth 2 (two) times daily.     nitroGLYCERIN (NITROSTAT) 0.4 MG SL tablet Place 0.4 mg under the tongue every 5 (five) minutes as needed for chest pain.     Norepinephrine-Dextrose 8-5 MG/250ML-% SOLN Inject 250 mLs into the vein continuous. Norepinephrine 8 mg in dextrose 5%, 250 ml infusion (32 mcg/ml) at 9.375 ml/hr (5 mcg/min)     oxybutynin (DITROPAN-XL) 10 MG 24 hr tablet Take 10 mg by mouth at bedtime.     oxyCODONE-acetaminophen (PERCOCET) 10-325 MG tablet Take 1 tablet by mouth every 6 (six) hours as needed for pain.     pantoprazole (PROTONIX) 40 MG tablet Take 1 tablet (40 mg total) by mouth 2 (two) times daily. (Patient taking differently: Take 40 mg by mouth at bedtime.) 60 tablet 11   piperacillin-tazobactam (ZOSYN) 4-0.5 GM/100ML SOLN IVPB Inject 4.5 g into the vein See admin instructions. Zosyn 4.5 gram in 100 ml normal saline 0.9% at 200 ml/hr - ONCE     potassium chloride SA (KLOR-CON M) 20 MEQ tablet Take 20 mEq by mouth daily.     pravastatin (PRAVACHOL) 40 MG tablet Take 40 mg by mouth at bedtime.     sodium chloride 0.9 % Inject 2,436 mLs  into the vein See admin instructions. Sodium Chloride 0.9% bolus, 2436 ml over 1 hour for 1 dose.     tamsulosin (FLOMAX) 0.4 MG CAPS capsule Take 0.4 mg by mouth at bedtime.     traZODone (DESYREL) 100 MG tablet TAKE ONE TABLET BY MOUTH AT BEDTIME AS NEEDED FOR SLEEP (Patient taking differently: Take 100 mg by mouth at bedtime as needed for sleep.) 30 tablet  3   diltiazem (TIADYLT ER) 120 MG 24 hr capsule Take 120 mg by mouth daily. (Patient not taking: Reported on 03/03/2022)       Physical Exam: Blood pressure 131/62, pulse (!) 118, temperature (!) 100.7 F (38.2 C), temperature source Oral, resp. rate (!) 30, height _0  (1.702 m), weight 81.1 kg, SpO2 94 %. General: pleasant, WD/WN elderly white male who is laying in bed in NAD HEENT: head is normocephalic, atraumatic.  Sclera are noninjected.  PERRL.  Ears and nose without any masses or lesions.  Mouth is pink and moist. Dentition fair Heart: Tachycardic with regular rhythm.   Lungs: CTAB, no wheezes, rhonchi, or rales noted.  Respiratory effort nonlabored Abd:  Soft, mild distension, diffuse upper abdominal ttp greatest in the epigastrium and RUQ, +BS. Noted midline incisional hernia that feels wide mouth and appears at least partially if not fully reducible but recurs given size and patient sitting up in chair. R anterior abdominal wall hernia over old ostomy scan that is reducible but again appears to recur as patient is sitting up in chair. MS: no BUE/BLE edema, calves soft and nontender Skin: warm and dry with no masses, lesions, or rashes Psych: A&Ox4 with an appropriate affect Neuro: cranial nerves grossly intact, normal speech, thought process intact, moves all extremities, gait not assessed   Results for orders placed or performed during the hospital encounter of 03/02/22 (from the past 48 hour(s))  Comprehensive metabolic panel     Status: Abnormal   Collection Time: 03/02/22  4:34 PM  Result Value Ref Range   Sodium 141 135 -  145 mmol/L   Potassium 5.0 3.5 - 5.1 mmol/L   Chloride 109 98 - 111 mmol/L   CO2 23 22 - 32 mmol/L   Glucose, Bld 137 (H) 70 - 99 mg/dL    Comment: Glucose reference range applies only to samples taken after fasting for at least 8 hours.   BUN 12 8 - 23 mg/dL   Creatinine, Ser 1.20 0.61 - 1.24 mg/dL   Calcium 8.3 (L) 8.9 - 10.3 mg/dL   Total Protein 5.2 (L) 6.5 - 8.1 g/dL   Albumin 2.8 (L) 3.5 - 5.0 g/dL   AST 291 (H) 15 - 41 U/L   ALT 329 (H) 0 - 44 U/L   Alkaline Phosphatase 97 38 - 126 U/L   Total Bilirubin 4.1 (H) 0.3 - 1.2 mg/dL   GFR, Estimated >60 >60 mL/min    Comment: (NOTE) Calculated using the CKD-EPI Creatinine Equation (2021)    Anion gap 9 5 - 15    Comment: Performed at [facility] [address], [place], [place] [address]  Lipase, blood     Status: None   Collection Time: 03/02/22  4:34 PM  Result Value Ref Range   Lipase 18 11 - 51 U/L    Comment: Performed at [facility] [address] [place], [place] [address]  Hemoglobin A1c     Status: Abnormal   Collection Time: 03/02/22  4:34 PM  Result Value Ref Range   Hgb A1c MFr Bld 6.1 (H) 4.8 - 5.6 %    Comment: (NOTE) Pre diabetes:          5.7%-6.4%  Diabetes:              >6.4%  Glycemic control for   <7.0% adults with diabetes    Mean Plasma Glucose 128.37 mg/dL    Comment: Performed at [facility]  [facility], [address] [place], [place] [address]  Type and screen [place]     Status: None   Collection Time: 03/02/22  4:45 PM  Result Value Ref Range   ABO/RH(D) A POS    Antibody Screen NEG    Sample Expiration      03/05/2022,2359 Performed at [facility], [place] [address], [place], [place] [address]   Glucose, capillary     Status: Abnormal   Collection Time: 03/02/22  5:03 PM  Result Value Ref Range   Glucose-Capillary 128 (H) 70 - 99 mg/dL    Comment: Glucose reference range applies only to samples taken after fasting for at least 8 hours.  MRSA Next  Gen by PCR, Nasal     Status: None   Collection Time: 03/02/22  8:46 PM   Specimen: Nasal Mucosa; Nasal Swab  Result Value Ref Range   MRSA by PCR Next Gen NOT DETECTED NOT DETECTED    Comment: (NOTE) The GeneXpert MRSA Assay (FDA approved for NASAL specimens only), is one component of a comprehensive MRSA colonization surveillance program. It is not intended to diagnose MRSA infection nor to guide or monitor treatment for MRSA infections. Test performance is not FDA approved in patients less than 23 years old. Performed at [facility], [place] [address], [place], [place] [address]   Glucose, capillary     Status: Abnormal   Collection Time: 03/02/22  9:03 PM  Result Value Ref Range   Glucose-Capillary 104 (H) 70 - 99 mg/dL    Comment: Glucose reference range applies only to samples taken after fasting for at least 8 hours.  CBC     Status: None   Collection Time: 03/03/22 12:05 AM  Result Value Ref Range   WBC 10.5 4.0 - 10.5 K/uL   RBC 4.25 4.22 - 5.81 MIL/uL   Hemoglobin 13.0 13.0 - 17.0 g/dL   HCT 39.6 39.0 - 52.0 %   MCV 93.2 80.0 - 100.0 fL   MCH 30.6 26.0 - 34.0 pg   MCHC 32.8 30.0 - 36.0 g/dL   RDW 13.9 11.5 - 15.5 %   Platelets 156 150 - 400 K/uL   nRBC 0.0 0.0 - 0.2 %    Comment: Performed at [facility], [place] [address] [place], [place] [address]  Basic metabolic panel     Status: Abnormal   Collection Time: 03/03/22 12:05 AM  Result Value Ref Range   Sodium 138 135 - 145 mmol/L   Potassium 4.0 3.5 - 5.1 mmol/L   Chloride 107 98 - 111 mmol/L   CO2 23 22 - 32 mmol/L   Glucose, Bld 99 70 - 99 mg/dL    Comment: Glucose reference range applies only to samples taken after fasting for at least 8 hours.   BUN 12 8 - 23 mg/dL   Creatinine, Ser 0.98 0.61 - 1.24 mg/dL   Calcium 8.3 (L) 8.9 - 10.3 mg/dL   GFR, Estimated >60 >60 mL/min    Comment: (NOTE) Calculated using the CKD-EPI Creatinine Equation (2021)    Anion gap 8 5 - 15    Comment: Performed at  [facility] [address] [place], [place] [address]  Magnesium     Status: Abnormal   Collection Time: 03/03/22 12:05 AM  Result Value Ref Range   Magnesium 1.3 (L) 1.7 - 2.4 mg/dL    Comment: Performed at [facility] [place],  [place] [address]  Phosphorus     Status: None   Collection Time: 03/03/22 12:05 AM  Result Value Ref Range   Phosphorus 3.5 2.5 - 4.6 mg/dL    Comment: Performed at [facility], [place] [address] [place], [place] [address]  ABO/Rh     Status: None   Collection Time: 03/03/22 12:05 AM  Result Value Ref Range   ABO/RH(D)      A POS Performed at [facility] [address] [place], [place] [address]   Magnesium     Status: None   Collection Time: 03/03/22  5:55 AM  Result Value Ref Range   Magnesium 2.3 1.7 - 2.4 mg/dL    Comment: Performed at [facility] [address] [place], [place] [address]  Hepatic function panel     Status: Abnormal   Collection Time: 03/03/22  5:55 AM  Result Value Ref Range   Total Protein 5.4 (L) 6.5 - 8.1 g/dL   Albumin 2.6 (L) 3.5 - 5.0 g/dL   AST 152 (H) 15 - 41 U/L   ALT 264 (H) 0 - 44 U/L   Alkaline Phosphatase 88 38 - 126 U/L   Total Bilirubin 4.6 (H) 0.3 - 1.2 mg/dL   Bilirubin, Direct 3.0 (H) 0.0 - 0.2 mg/dL   Indirect Bilirubin 1.6 (H) 0.3 - 0.9 mg/dL    Comment: Performed at [facility] [address], [place], [place] [address]  Glucose, capillary     Status: None   Collection Time: 03/03/22  7:27 AM  Result Value Ref Range   Glucose-Capillary 87 70 - 99 mg/dL    Comment: Glucose reference range applies only to samples taken after fasting for at least 8 hours.   CT ABDOMEN PELVIS W WO CONTRAST  Result Date: 03/03/2022 CLINICAL DATA:  Acute nonlocalized abdominal pain. EXAM: CT ABDOMEN AND PELVIS WITHOUT AND WITH CONTRAST TECHNIQUE: Multidetector CT imaging of the abdomen and pelvis was performed following the standard protocol before and following the  bolus administration of intravenous contrast. RADIATION DOSE REDUCTION: This exam was performed according to the departmental dose-optimization program which includes automated exposure control, adjustment of the mA and/or kV according to patient size and/or use of iterative reconstruction technique. CONTRAST:  175m OMNIPAQUE IOHEXOL 300 MG/ML  SOLN COMPARISON:  None Available. FINDINGS: Lower chest: Centrilobular emphsyema noted.Calcified and noncalcified micro nodularity noted right lower lobe with areas of subsegmental atelectasis at the right base. Hepatobiliary: Tiny hypodensities in the right liver are too small to characterize but likely benign. Gallstones measure up to 12 mm diameter. Despite the lack of biliary dilatation, an 8 mm stone is identified in the common bile duct (axial 24/6 and coronal 44/12). Pancreas: No focal mass lesion. No dilatation of the main duct. No intraparenchymal cyst. No peripancreatic edema. Spleen: Calcified granulomata. Adrenals/Urinary Tract: No adrenal nodule or mass. 3 nonobstructing stones are seen in the right kidney measuring up to 10 mm in the lower pole region. Bilateral renal cysts evident. No evidence for hydroureter. The urinary bladder appears normal for the degree of distention. Stomach/Bowel: Stomach is unremarkable. No gastric wall thickening. No evidence of outlet obstruction. Duodenum is normally positioned as is the ligament of Treitz. Duodenal diverticulum noted. No small bowel wall thickening. No small bowel dilatation. Status post right hemicolectomy. Diverticular changes are noted in the left colon without evidence of diverticulitis. Vascular/Lymphatic: There is moderate atherosclerotic calcification of the abdominal aorta without aneurysm. There is no gastrohepatic  or hepatoduodenal ligament lymphadenopathy. No retroperitoneal or mesenteric lymphadenopathy. No pelvic sidewall lymphadenopathy. Reproductive: The prostate gland and seminal vesicles are  unremarkable. Other: No intraperitoneal free fluid. 4.4 x 1.0 cm focus of abnormal soft tissue is identified in the midline anterior abdomen, involving the omentum/anterior peritoneum (31/3). Musculoskeletal: No worrisome lytic or sclerotic osseous abnormality. Lumbar fusion hardware evident. 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 Midline wide-mouth hernia contains small bowel without complicating features (72/0). IMPRESSION: 1. Cholelithiasis with choledocholithiasis. 8 mm stone in the common bile duct without biliary dilatation. 2. 4.4 x 1.0 cm focus of soft tissue density in the omentum/peritoneum of the anterior midline abdominal wall. This may be granulation tissue/scarring related to prior surgery, but follow-up recommended to ensure stability, especially if the patient has a cancer history. Consider follow-up CT in 3 months. Alternatively the patient has prior CT imaging of the abdomen, comparison to those older studies to assess chronicity would likely prove helpful. 3. Nonobstructing right nephrolithiasis. 4. Left colonic diverticulosis without diverticulitis. 5. Calcified and noncalcified micro nodularity right lower lobe, likely sequelae of infection/inflammation or potential prior aspiration. 6. Aortic Atherosclerosis (ICD10-I70.0) and Emphysema (ICD10-J43.9). Electronically Signed   By: Misty Stanley M.D.   On: 03/03/2022 08:20   ECHOCARDIOGRAM COMPLETE  Result Date: 03/02/2022    ECHOCARDIOGRAM REPORT   Patient Name:   GERLAD PELZEL Date of Exam: 03/02/2022 Medical Rec #:  [id number]         Height:       67.0 in Accession #:    [id number]        Weight:       178.8 lb Date of Birth:  04-29-1941          BSA:          1.928 m Patient Age:    6 years          BP:           128/64 mmHg Patient Gender: M                 HR:           78 bpm. Exam Location:  Inpatient Procedure: 2D Echo, Cardiac Doppler, Color Doppler and  Intracardiac            Opacification Agent Indications:    Pre-operative evaluation  History:        Patient has prior history of Echocardiogram examinations, most                 recent 09/04/2015. CAD, COPD, Aortic Valve Disease; Risk                 Factors:Hypertension, Dyslipidemia, Former Smoker and Diabetes.                 Shock.  Sonographer:    Clayton Lefort RDCS (AE) Referring Phys: [id number] Candee Furbish  Sonographer Comments: Suboptimal parasternal window and Technically difficult study due to poor echo windows. Image acquisition challenging due to COPD. IMPRESSIONS  1. Left ventricular ejection fraction, by estimation, is 60 to 65%. The left ventricle has normal function. The left ventricle has no regional wall motion abnormalities. There is moderate left ventricular hypertrophy. Left ventricular diastolic parameters are consistent with Grade I diastolic dysfunction (impaired relaxation).  2. Right ventricular systolic function is normal. The right ventricular size is normal. Tricuspid regurgitation signal is inadequate for assessing PA pressure.  3.  The mitral valve is abnormal. Trivial mitral valve regurgitation. Moderate mitral annular calcification.  4. The aortic valve is tricuspid. There is mild calcification of the aortic valve. Aortic valve regurgitation is not visualized. Mild to moderate aortic valve stenosis. Aortic valve area, by VTI measures 1.47 cm. Aortic valve mean gradient measures 8.0  mmHg. Aortic valve Vmax measures 2.06 m/s.  5. The inferior vena cava is dilated in size with >50% respiratory variability, suggesting right atrial pressure of 8 mmHg. Comparison(s): Changes from prior study are noted. 09/04/2015: LVEF 55-60%, aortic sclerosis without stenosis. FINDINGS  Left Ventricle: Left ventricular ejection fraction, by estimation, is 60 to 65%. The left ventricle has normal function. The left ventricle has no regional wall motion abnormalities. Definity contrast agent was given IV to  delineate the left ventricular  endocardial borders. The left ventricular internal cavity size was normal in size. There is moderate left ventricular hypertrophy. Left ventricular diastolic parameters are consistent with Grade I diastolic dysfunction (impaired relaxation). Indeterminate filling pressures. Right Ventricle: The right ventricular size is normal. No increase in right ventricular wall thickness. Right ventricular systolic function is normal. Tricuspid regurgitation signal is inadequate for assessing PA pressure. Left Atrium: Left atrial size was normal in size. Right Atrium: Right atrial size was normal in size. Pericardium: There is no evidence of pericardial effusion. Mitral Valve: The mitral valve is abnormal. Moderate mitral annular calcification. Trivial mitral valve regurgitation. Tricuspid Valve: The tricuspid valve is normal in structure. Tricuspid valve regurgitation is not demonstrated. Aortic Valve: The aortic valve is tricuspid. There is mild calcification of the aortic valve. Aortic valve regurgitation is not visualized. Mild to moderate aortic stenosis is present. Aortic valve mean gradient measures 8.0 mmHg. Aortic valve peak gradient measures 17.0 mmHg. Aortic valve area, by VTI measures 1.47 cm. Pulmonic Valve: The pulmonic valve was not well visualized. Pulmonic valve regurgitation is not visualized. Aorta: The aortic root and ascending aorta are structurally normal, with no evidence of dilitation. Venous: The inferior vena cava is dilated in size with greater than 50% respiratory variability, suggesting right atrial pressure of 8 mmHg. IAS/Shunts: No atrial level shunt detected by color flow Doppler.  LEFT VENTRICLE PLAX 2D LVIDd:         3.80 cm   Diastology LVIDs:         2.70 cm   LV e' medial:    6.64 cm/s LV PW:         1.60 cm   LV E/e' medial:  13.0 LV IVS:        1.40 cm   LV e' lateral:   10.40 cm/s LVOT diam:     2.20 cm   LV E/e' lateral: 8.3 LV SV:         59 LV SV Index:    31 LVOT Area:     3.80 cm  RIGHT VENTRICLE             IVC RV Basal diam:  3.30 cm     IVC diam: 2.50 cm RV S prime:     13.10 cm/s TAPSE (M-mode): 1.5 cm LEFT ATRIUM             Index        RIGHT ATRIUM           Index LA diam:        3.20 cm 1.66 cm/m   RA Area:     20.20 cm LA Vol (A2C):   54.8 ml 28.42 ml/m  RA Volume:   64.40 ml  33.40 ml/m LA Vol (A4C):   37.9 ml 19.66 ml/m LA Biplane Vol: 49.2 ml 25.52 ml/m  AORTIC VALVE AV Area (Vmax):    1.35 cm AV Area (Vmean):   1.44 cm AV Area (VTI):     1.47 cm AV Vmax:           206.00 cm/s AV Vmean:          135.000 cm/s AV VTI:            0.402 m AV Peak Grad:      17.0 mmHg AV Mean Grad:      8.0 mmHg LVOT Vmax:         73.10 cm/s LVOT Vmean:        51.300 cm/s LVOT VTI:          0.155 m LVOT/AV VTI ratio: 0.39  AORTA Ao Root diam: 3.60 cm Ao Asc diam:  3.50 cm MITRAL VALVE MV Area (PHT): 3.17 cm    SHUNTS MV Decel Time: 239 msec    Systemic VTI:  0.16 m MV E velocity: 86.50 cm/s  Systemic Diam: 2.20 cm MV A velocity: 81.80 cm/s MV E/A ratio:  1.06 Lyman Bishop MD Electronically signed by Lyman Bishop MD Signature Date/Time: 03/02/2022/6:21:49 PM    Final     Anti-infectives (From admission, onward)    Start     Dose/Rate Route Frequency Ordered Stop   03/02/22 1745  piperacillin-tazobactam (ZOSYN) IVPB 3.375 g        3.375 g 12.5 mL/hr over 240 Minutes Intravenous Every 8 hours 03/02/22 1651     03/02/22 1700  piperacillin-tazobactam (ZOSYN) IVPB 3.375 g  Status:  Discontinued        3.375 g 100 mL/hr over 30 Minutes Intravenous  Once 03/02/22 1604 03/02/22 1651       Assessment/Plan Choledocholithiasis, possible Cholangitis Cholecystitis - Patient seen and examined. Labs, vitals, I/O, imaging and available notes reviewed. This is a 81 y.o. male who presented with abdominal pain and was found to have Acute Cholecystitis changes on [place] and Choledocholithiasis on CT w/ elevated LFT's on lab work. AT [facility] noted to be in septic shock  requiring Levo so was transferred to [facility]. He is now off Levo and wbc today is wnl at 10.5. GI is planning ERCP after Plavix has had time to wash out. Given his multiple medical problems would recommend Cards eval for cardiac clearance to determine if he would be candidate for Cholecystectomy (would like be open give prior surgeries) after ERCP. Would continue abx. If he was to acutely worsen, would recommend Perc Chole. We will follow with you.   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 Has seen Gen Surgery, Dr. Tally Due at [facility] for this. Denies obstructive symptoms. 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 Exam as noted above. Will monitor but suspect can f/u as outpatient for this  - Per CCM -  Hx of CAD s/p stents x 3 on Plavix (last dose 03/01/22 per chart) COPD on 3-5L o2 at night DM2 HTN HLD GERD Incidental finding - per Radiology there is a 4.4 x 1.0 cm focus of soft tissue density in the omentum/peritoneum of the anterior midline abdominal wall. They recommended follow-up CT  in 3 months.  Jillyn Ledger, [facility] 03/03/2022, 11:19 AM Please see Amion for pager number during day hours 7:00am-4:30pm

## 2022-03-04 DIAGNOSIS — I350 Nonrheumatic aortic (valve) stenosis: Secondary | ICD-10-CM | POA: Diagnosis not present

## 2022-03-04 DIAGNOSIS — I25118 Atherosclerotic heart disease of native coronary artery with other forms of angina pectoris: Secondary | ICD-10-CM | POA: Diagnosis not present

## 2022-03-04 DIAGNOSIS — K8050 Calculus of bile duct without cholangitis or cholecystitis without obstruction: Secondary | ICD-10-CM

## 2022-03-04 DIAGNOSIS — R579 Shock, unspecified: Secondary | ICD-10-CM | POA: Diagnosis not present

## 2022-03-04 DIAGNOSIS — K8309 Other cholangitis: Secondary | ICD-10-CM | POA: Diagnosis not present

## 2022-03-04 DIAGNOSIS — Z01810 Encounter for preprocedural cardiovascular examination: Secondary | ICD-10-CM

## 2022-03-04 LAB — CBC WITH DIFFERENTIAL/PLATELET
Abs Immature Granulocytes: 0.01 10*3/uL (ref 0.00–0.07)
Basophils Absolute: 0 10*3/uL (ref 0.0–0.1)
Basophils Relative: 0 %
Eosinophils Absolute: 0.1 10*3/uL (ref 0.0–0.5)
Eosinophils Relative: 1 %
HCT: 37.6 % — ABNORMAL LOW (ref 39.0–52.0)
Hemoglobin: 12.5 g/dL — ABNORMAL LOW (ref 13.0–17.0)
Immature Granulocytes: 0 %
Lymphocytes Relative: 12 %
Lymphs Abs: 0.7 10*3/uL (ref 0.7–4.0)
MCH: 30.6 pg (ref 26.0–34.0)
MCHC: 33.2 g/dL (ref 30.0–36.0)
MCV: 91.9 fL (ref 80.0–100.0)
Monocytes Absolute: 0.5 10*3/uL (ref 0.1–1.0)
Monocytes Relative: 8 %
Neutro Abs: 4.9 10*3/uL (ref 1.7–7.7)
Neutrophils Relative %: 79 %
Platelets: 154 10*3/uL (ref 150–400)
RBC: 4.09 MIL/uL — ABNORMAL LOW (ref 4.22–5.81)
RDW: 13.9 % (ref 11.5–15.5)
WBC: 6.3 10*3/uL (ref 4.0–10.5)
nRBC: 0 % (ref 0.0–0.2)

## 2022-03-04 LAB — COMPREHENSIVE METABOLIC PANEL
ALT: 184 U/L — ABNORMAL HIGH (ref 0–44)
AST: 72 U/L — ABNORMAL HIGH (ref 15–41)
Albumin: 2.4 g/dL — ABNORMAL LOW (ref 3.5–5.0)
Alkaline Phosphatase: 103 U/L (ref 38–126)
Anion gap: 6 (ref 5–15)
BUN: 9 mg/dL (ref 8–23)
CO2: 26 mmol/L (ref 22–32)
Calcium: 8.5 mg/dL — ABNORMAL LOW (ref 8.9–10.3)
Chloride: 106 mmol/L (ref 98–111)
Creatinine, Ser: 0.98 mg/dL (ref 0.61–1.24)
GFR, Estimated: >60 mL/min (ref >60)
Glucose, Bld: 111 mg/dL — ABNORMAL HIGH (ref 70–99)
Potassium: 4.2 mmol/L (ref 3.5–5.1)
Sodium: 138 mmol/L (ref 135–145)
Total Bilirubin: 2.9 mg/dL — ABNORMAL HIGH (ref 0.3–1.2)
Total Protein: 5 g/dL — ABNORMAL LOW (ref 6.5–8.1)

## 2022-03-04 LAB — GLUCOSE, CAPILLARY
Glucose-Capillary: 112 mg/dL — ABNORMAL HIGH (ref 70–99)
Glucose-Capillary: 117 mg/dL — ABNORMAL HIGH (ref 70–99)
Glucose-Capillary: 220 mg/dL — ABNORMAL HIGH (ref 70–99)
Glucose-Capillary: 84 mg/dL (ref 70–99)

## 2022-03-04 LAB — MAGNESIUM: Magnesium: 1.9 mg/dL (ref 1.7–2.4)

## 2022-03-04 MED ORDER — OXYCODONE HCL 5 MG PO TABS
10.0000 mg | ORAL_TABLET | ORAL | Status: DC | PRN
  Administered 2022-03-04 – 2022-03-07 (×6): 10 mg via ORAL
  Filled 2022-03-04 (×7): qty 2

## 2022-03-04 MED ORDER — TAMSULOSIN HCL 0.4 MG PO CAPS
0.4000 mg | ORAL_CAPSULE | Freq: Every day | ORAL | Status: DC
  Administered 2022-03-04 – 2022-03-06 (×3): 0.4 mg via ORAL
  Filled 2022-03-04 (×3): qty 1

## 2022-03-04 MED ORDER — FENTANYL 100 MCG/HR TD PT72
1.0000 | MEDICATED_PATCH | TRANSDERMAL | Status: DC
  Administered 2022-03-04: 1 via TRANSDERMAL
  Filled 2022-03-04: qty 1

## 2022-03-04 MED ORDER — MORPHINE SULFATE (PF) 2 MG/ML IV SOLN
2.0000 mg | INTRAVENOUS | Status: DC | PRN
  Administered 2022-03-05 – 2022-03-07 (×5): 2 mg via INTRAVENOUS
  Filled 2022-03-04 (×5): qty 1

## 2022-03-04 NOTE — Progress Notes (Signed)
Subjective: CC: Reports no abdominal pain at rest. Some RUQ pain with palpation. No n/v. Tolerated clears yesterday. Passing flatus. Notes very small bm yesterday.   Objective: Vital signs in last 24 hours: Temp:  [97.9 F (36.6 C)-98.7 F (37.1 C)] 98.3 F (36.8 C) (09/06 0729) Pulse Rate:  [68-119] 84 (09/06 0800) Resp:  [19-36] 29 (09/06 0800) BP: (97-148)/(48-89) 130/65 (09/06 0800) SpO2:  [89 %-95 %] 94 % (09/06 0800) Last BM Date : 03/02/22  Intake/Output from previous day: 09/05 0701 - 09/06 0700 In: 628.7 [P.O.:360; I.V.:118.9; IV Piggyback:149.8] Out: 2925 [Urine:2925] Intake/Output this shift: No intake/output data recorded.  PE: Gen:  Alert, NAD, pleasant Abd:  Soft, mild distension, diffuse upper abdominal ttp greatest in the epigastrium and RUQ, +BS. Noted midline incisional hernia that feels wide mouth and appears and is reducible but recurs given size. R anterior abdominal wall hernia over old ostomy scan that is reducible but appears to recur again after patient coughs.  Lab Results:  Recent Labs    03/03/22 0005 03/04/22 0527  WBC 10.5 6.3  HGB 13.0 12.5*  HCT 39.6 37.6*  PLT 156 154   BMET Recent Labs    03/03/22 0005 03/04/22 0527  NA 138 138  K 4.0 4.2  CL 107 106  CO2 23 26  GLUCOSE 99 111*  BUN 12 9  CREATININE 0.98 0.98  CALCIUM 8.3* 8.5*   PT/INR No results for input(s): "LABPROT", "INR" in the last 72 hours. CMP     Component Value Date/Time   NA 138 03/04/2022 0527   NA 138 02/08/2014 1236   K 4.2 03/04/2022 0527   CL 106 03/04/2022 0527   CO2 26 03/04/2022 0527   GLUCOSE 111 (H) 03/04/2022 0527   BUN 9 03/04/2022 0527   BUN 9 02/08/2014 1236   CREATININE 0.98 03/04/2022 0527   CREATININE 1.09 12/12/2012 1113   CALCIUM 8.5 (L) 03/04/2022 0527   PROT 5.0 (L) 03/04/2022 0527   PROT 6.5 02/08/2014 1236   ALBUMIN 2.4 (L) 03/04/2022 0527   ALBUMIN 4.5 02/08/2014 1236   AST 72 (H) 03/04/2022 0527   ALT 184 (H)  03/04/2022 0527   ALKPHOS 103 03/04/2022 0527   BILITOT 2.9 (H) 03/04/2022 0527   GFRNONAA >60 03/04/2022 0527   GFRNONAA 68 12/12/2012 1113   GFRAA >60 08/23/2015 1943   GFRAA 79 12/12/2012 1113   Lipase     Component Value Date/Time   LIPASE 18 03/02/2022 1634    Studies/Results: CT ABDOMEN PELVIS W WO CONTRAST  Result Date: 03/03/2022 CLINICAL DATA:  Acute nonlocalized abdominal pain. EXAM: CT ABDOMEN AND PELVIS WITHOUT AND WITH CONTRAST TECHNIQUE: Multidetector CT imaging of the abdomen and pelvis was performed following the standard protocol before and following the bolus administration of intravenous contrast. RADIATION DOSE REDUCTION: This exam was performed according to the departmental dose-optimization program which includes automated exposure control, adjustment of the mA and/or kV according to patient size and/or use of iterative reconstruction technique. CONTRAST:  162m OMNIPAQUE IOHEXOL 300 MG/ML  SOLN COMPARISON:  None Available. FINDINGS: Lower chest: Centrilobular emphsyema noted.Calcified and noncalcified micro nodularity noted right lower lobe with areas of subsegmental atelectasis at the right base. Hepatobiliary: Tiny hypodensities in the right liver are too small to characterize but likely benign. Gallstones measure up to 12 mm diameter. Despite the lack of biliary dilatation, an 8 mm stone is identified in the common bile duct (axial 24/6 and coronal 44/12). Pancreas: No focal  mass lesion. No dilatation of the main duct. No intraparenchymal cyst. No peripancreatic edema. Spleen: Calcified granulomata. Adrenals/Urinary Tract: No adrenal nodule or mass. 3 nonobstructing stones are seen in the right kidney measuring up to 10 mm in the lower pole region. Bilateral renal cysts evident. No evidence for hydroureter. The urinary bladder appears normal for the degree of distention. Stomach/Bowel: Stomach is unremarkable. No gastric wall thickening. No evidence of outlet obstruction.  Duodenum is normally positioned as is the ligament of Treitz. Duodenal diverticulum noted. No small bowel wall thickening. No small bowel dilatation. Status post right hemicolectomy. Diverticular changes are noted in the left colon without evidence of diverticulitis. Vascular/Lymphatic: There is moderate atherosclerotic calcification of the abdominal aorta without aneurysm. There is no gastrohepatic or hepatoduodenal ligament lymphadenopathy. No retroperitoneal or mesenteric lymphadenopathy. No pelvic sidewall lymphadenopathy. Reproductive: The prostate gland and seminal vesicles are unremarkable. Other: No intraperitoneal free fluid. 4.4 x 1.0 cm focus of abnormal soft tissue is identified in the midline anterior abdomen, involving the omentum/anterior peritoneum (31/3). Musculoskeletal: No worrisome lytic or sclerotic osseous abnormality. Lumbar fusion hardware evident. 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 Midline wide-mouth hernia contains small bowel without complicating features (52/7). IMPRESSION: 1. Cholelithiasis with choledocholithiasis. 8 mm stone in the common bile duct without biliary dilatation. 2. 4.4 x 1.0 cm focus of soft tissue density in the omentum/peritoneum of the anterior midline abdominal wall. This may be granulation tissue/scarring related to prior surgery, but follow-up recommended to ensure stability, especially if the patient has a cancer history. Consider follow-up CT in 3 months. Alternatively the patient has prior CT imaging of the abdomen, comparison to those older studies to assess chronicity would likely prove helpful. 3. Nonobstructing right nephrolithiasis. 4. Left colonic diverticulosis without diverticulitis. 5. Calcified and noncalcified micro nodularity right lower lobe, likely sequelae of infection/inflammation or potential prior aspiration. 6. Aortic Atherosclerosis (ICD10-I70.0) and Emphysema  (ICD10-J43.9). Electronically Signed   By: Misty Stanley M.D.   On: 03/03/2022 08:20   ECHOCARDIOGRAM COMPLETE  Result Date: 03/02/2022    ECHOCARDIOGRAM REPORT   Patient Name:   Herbert May Date of Exam: 03/02/2022 Medical Rec #:  [id number]         Height:       67.0 in Accession #:    [id number]        Weight:       178.8 lb Date of Birth:  03-Dec-1940          BSA:          1.928 m Patient Age:    81 years          BP:           128/64 mmHg Patient Gender: M                 HR:           78 bpm. Exam Location:  Inpatient Procedure: 2D Echo, Cardiac Doppler, Color Doppler and Intracardiac            Opacification Agent Indications:    Pre-operative evaluation  History:        Patient has prior history of Echocardiogram examinations, most                 recent 09/04/2015. CAD, COPD, Aortic Valve Disease; Risk                 Factors:Hypertension, Dyslipidemia, Former  Smoker and Diabetes.                 Shock.  Sonographer:    Clayton Lefort RDCS (AE) Referring Phys: [id number] Candee Furbish  Sonographer Comments: Suboptimal parasternal window and Technically difficult study due to poor echo windows. Image acquisition challenging due to COPD. IMPRESSIONS  1. Left ventricular ejection fraction, by estimation, is 60 to 65%. The left ventricle has normal function. The left ventricle has no regional wall motion abnormalities. There is moderate left ventricular hypertrophy. Left ventricular diastolic parameters are consistent with Grade I diastolic dysfunction (impaired relaxation).  2. Right ventricular systolic function is normal. The right ventricular size is normal. Tricuspid regurgitation signal is inadequate for assessing PA pressure.  3. The mitral valve is abnormal. Trivial mitral valve regurgitation. Moderate mitral annular calcification.  4. The aortic valve is tricuspid. There is mild calcification of the aortic valve. Aortic valve regurgitation is not visualized. Mild to moderate aortic valve stenosis. Aortic  valve area, by VTI measures 1.47 cm. Aortic valve mean gradient measures 8.0  mmHg. Aortic valve Vmax measures 2.06 m/s.  5. The inferior vena cava is dilated in size with >50% respiratory variability, suggesting right atrial pressure of 8 mmHg. Comparison(s): Changes from prior study are noted. 09/04/2015: LVEF 55-60%, aortic sclerosis without stenosis. FINDINGS  Left Ventricle: Left ventricular ejection fraction, by estimation, is 60 to 65%. The left ventricle has normal function. The left ventricle has no regional wall motion abnormalities. Definity contrast agent was given IV to delineate the left ventricular  endocardial borders. The left ventricular internal cavity size was normal in size. There is moderate left ventricular hypertrophy. Left ventricular diastolic parameters are consistent with Grade I diastolic dysfunction (impaired relaxation). Indeterminate filling pressures. Right Ventricle: The right ventricular size is normal. No increase in right ventricular wall thickness. Right ventricular systolic function is normal. Tricuspid regurgitation signal is inadequate for assessing PA pressure. Left Atrium: Left atrial size was normal in size. Right Atrium: Right atrial size was normal in size. Pericardium: There is no evidence of pericardial effusion. Mitral Valve: The mitral valve is abnormal. Moderate mitral annular calcification. Trivial mitral valve regurgitation. Tricuspid Valve: The tricuspid valve is normal in structure. Tricuspid valve regurgitation is not demonstrated. Aortic Valve: The aortic valve is tricuspid. There is mild calcification of the aortic valve. Aortic valve regurgitation is not visualized. Mild to moderate aortic stenosis is present. Aortic valve mean gradient measures 8.0 mmHg. Aortic valve peak gradient measures 17.0 mmHg. Aortic valve area, by VTI measures 1.47 cm. Pulmonic Valve: The pulmonic valve was not well visualized. Pulmonic valve regurgitation is not visualized. Aorta:  The aortic root and ascending aorta are structurally normal, with no evidence of dilitation. Venous: The inferior vena cava is dilated in size with greater than 50% respiratory variability, suggesting right atrial pressure of 8 mmHg. IAS/Shunts: No atrial level shunt detected by color flow Doppler.  LEFT VENTRICLE PLAX 2D LVIDd:         3.80 cm   Diastology LVIDs:         2.70 cm   LV e' medial:    6.64 cm/s LV PW:         1.60 cm   LV E/e' medial:  13.0 LV IVS:        1.40 cm   LV e' lateral:   10.40 cm/s LVOT diam:     2.20 cm   LV E/e' lateral: 8.3 LV SV:  52 LV SV Index:   31 LVOT Area:     3.80 cm  RIGHT VENTRICLE             IVC RV Basal diam:  3.30 cm     IVC diam: 2.50 cm RV S prime:     13.10 cm/s TAPSE (M-mode): 1.5 cm LEFT ATRIUM             Index        RIGHT ATRIUM           Index LA diam:        3.20 cm 1.66 cm/m   RA Area:     20.20 cm LA Vol (A2C):   54.8 ml 28.42 ml/m  RA Volume:   64.40 ml  33.40 ml/m LA Vol (A4C):   37.9 ml 19.66 ml/m LA Biplane Vol: 49.2 ml 25.52 ml/m  AORTIC VALVE AV Area (Vmax):    1.35 cm AV Area (Vmean):   1.44 cm AV Area (VTI):     1.47 cm AV Vmax:           206.00 cm/s AV Vmean:          135.000 cm/s AV VTI:            0.402 m AV Peak Grad:      17.0 mmHg AV Mean Grad:      8.0 mmHg LVOT Vmax:         73.10 cm/s LVOT Vmean:        51.300 cm/s LVOT VTI:          0.155 m LVOT/AV VTI ratio: 0.39  AORTA Ao Root diam: 3.60 cm Ao Asc diam:  3.50 cm MITRAL VALVE MV Area (PHT): 3.17 cm    SHUNTS MV Decel Time: 239 msec    Systemic VTI:  0.16 m MV E velocity: 86.50 cm/s  Systemic Diam: 2.20 cm MV A velocity: 81.80 cm/s MV E/A ratio:  1.06 Lyman Bishop MD Electronically signed by Lyman Bishop MD Signature Date/Time: 03/02/2022/6:21:49 PM    Final     Anti-infectives: Anti-infectives (From admission, onward)    Start     Dose/Rate Route Frequency Ordered Stop   03/02/22 1745  piperacillin-tazobactam (ZOSYN) IVPB 3.375 g        3.375 g 12.5 mL/hr over 240  Minutes Intravenous Every 8 hours 03/02/22 1651     03/02/22 1700  piperacillin-tazobactam (ZOSYN) IVPB 3.375 g  Status:  Discontinued        3.375 g 100 mL/hr over 30 Minutes Intravenous  Once 03/02/22 1604 03/02/22 1651        Assessment/Plan Choledocholithiasis, possible Cholangitis Cholecystitis - Workup revealing Acute Cholecystitis changes on [place] and Choledocholithiasis on CT w/ elevated LFT's on lab work. AT [facility] noted to be in septic shock requiring Levo so was transferred to [facility]. He is now off Levo, tachycardia resolved, no hypotension, and wbc wnl today. Cont abx - LFT's downtrending. GI is planning ERCP after Plavix has had time to wash out (9/7) - Given his multiple medical problems would recommend Cards eval for cardiac clearance to determine if he would be candidate for Cholecystectomy (this would likely be an open Cholecystectomy give prior surgeries) after ERCP. If he was to acutely worsen, would recommend Perc Chole. We will follow with you. We will follow with you.    FEN - On FLD. 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 Has seen Gen Surgery, Dr. Tally Due at [facility] for this. Denies obstructive symptoms. 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 Exam as noted above. Will monitor but suspect can f/u as outpatient for this   - Per CCM -  Hx of CAD s/p stents x 3 on Plavix (last dose 03/01/22 per chart) COPD on 3-5L o2 at night DM2 HTN HLD GERD Incidental finding - per Radiology there is a 4.4 x 1.0 cm focus of soft tissue density in the omentum/peritoneum of the anterior midline abdominal wall. They recommended follow-up CT in 3 months.   LOS: 2 days    Jillyn Ledger , [facility] 03/04/2022, 9:59 AM Please see Amion for pager number during day hours 7:00am-4:30pm

## 2022-03-04 NOTE — Consult Note (Addendum)
Cardiology Consultation   Patient ID: Herbert May MRN: [id number]; DOB: 08-Aug-1940  Admit date: 03/02/2022 Date of Consult: 03/04/2022  PCP:  Leonie Douglas, [place] [facility] Cardiologist:  Kate Sable, MD (Inactive)        Patient Profile:   Herbert May is a 81 y.o. male with a hx of CAD w/ stents x3 on Plavix, prior MI, COPD, chronic hypoxic respiratory failure on 3-5L O2 at night, T2DM, HTN, HLD, GERD, chronic back pain, former smoker who is being seen 03/04/2022 for the evaluation of pre-operative clearance at the request of Alferd Apa, PA-C.  History of Present Illness:   Herbert May is a 81 y.o. male that presented with worsening back pain and associated abdominal pain on 9/3. He tried lidocaine patches for his back with no relief which prompted him to be evaluated at [facility]. He was transferred here from [facility] with septic shock presumed abdominal course. CT showed cholecystitis and choledocholithiasis without biliary dilatation. GI is planning ERCP on 9/7 after holding Plavix (chart noted last dose was 9/3). Surgery is considering open cholecystectomy after ERCP. He is reporting feeling better with less back and abdominal pain. Tolerating po intake. Denies any chest pain, dyspnea, orthopnea, or peripheral edema.   Per chart review, last stent in 2008 of the proximal LAD with previous mid-LAD stent noted. Done in [place].  He is able to perform his ADLs and IADLs prior to his admission. He reports being active at home. He is able to climb stairs on his porch without chest pain or dyspnea. Ambulates with a cane.   Past Medical History:  Diagnosis Date   Allergy    Arthritis    Asthma    CAD (coronary artery disease) of artery bypass graft 12/27/2012   Colostomy status (HCC)    COPD (chronic obstructive pulmonary disease) (HCC)    Diabetes mellitus without complication (HCC)    Emphysema of lung (HCC)    GERD  (gastroesophageal reflux disease)    Heart attack ([place])    Hyperlipidemia    Hypertension    Oxygen deficiency     Past Surgical History:  Procedure Laterality Date   BACK SURGERY     cardiac stents     COLOSTOMY     COLOSTOMY CLOSURE     HERNIA REPAIR     KNEE SURGERY     NOSE SURGERY       Home Medications:  Prior to Admission medications   Medication Sig Start Date End Date Taking? Authorizing Provider  albuterol (PROVENTIL) (2.5 MG/3ML) 0.083% nebulizer solution INHALE CONTENTS OF 1 VIAL IN NEBULIZER EVERY 6 HOURS AS NEEDED FOR WHEEZING OR SHORTNESS OF BREATH. Patient taking differently: Take 2.5 mg by nebulization every 6 (six) hours as needed for shortness of breath or wheezing. 05/06/21  Yes Tanda Rockers, MD  albuterol (VENTOLIN HFA) 108 (90 Base) MCG/ACT inhaler Inhale 2 puffs into the lungs every 6 (six) hours as needed for wheezing or shortness of breath.   Yes [provider]  clopidogrel (PLAVIX) 75 MG tablet Take 1 tablet (75 mg total) by mouth daily. 02/21/18  Yes Herminio Commons, MD  Cyanocobalamin (VITAMIN B-12 PO) Take 1 tablet by mouth daily.   Yes [provider]  fentaNYL (DURAGESIC - DOSED MCG/HR) 100 MCG/HR Apply one patch to skin every 3 days if tolerated Patient taking differently: 1 patch every 3 (three) days. 02/26/16  Yes Mohammed Kindle, MD  fentaNYL Citrate (SUBLIMAZE  IJ) Inject 12.5 mg as directed once.   Yes [provider]  fluticasone (FLONASE) 50 MCG/ACT nasal spray Place 2 sprays into both nostrils daily.   Yes [provider]  gabapentin (NEURONTIN) 100 MG capsule Take 200 mg by mouth 2 (two) times daily. 02/06/22  Yes [provider]  ibuprofen (ADVIL) 600 MG tablet Take 600 mg by mouth once.   Yes [provider]  ipratropium-albuterol (DUONEB) 0.5-2.5 (3) MG/3ML SOLN Take 3 mLs by nebulization once.   Yes [provider]  lidocaine (LIDODERM) 5 % Place 1 patch onto the skin daily  as needed (pain).   Yes [provider]  metFORMIN (GLUCOPHAGE) 1000 MG tablet Take 1,000 mg by mouth at bedtime.   Yes [provider]  methocarbamol (ROBAXIN) 500 MG tablet Take 500 mg by mouth at bedtime as needed for muscle spasms. 04/07/21  Yes [provider]  metoprolol succinate (TOPROL-XL) 25 MG 24 hr tablet Take 25 mg by mouth at bedtime. 02/25/22  Yes [provider]  midodrine (PROAMATINE) 5 MG tablet Take 5 mg by mouth 2 (two) times daily.   Yes [provider]  nitroGLYCERIN (NITROSTAT) 0.4 MG SL tablet Place 0.4 mg under the tongue every 5 (five) minutes as needed for chest pain.   Yes [provider]  Norepinephrine-Dextrose 8-5 MG/250ML-% SOLN Inject 250 mLs into the vein continuous. Norepinephrine 8 mg in dextrose 5%, 250 ml infusion (32 mcg/ml) at 9.375 ml/hr (5 mcg/min)   Yes [provider]  oxybutynin (DITROPAN-XL) 10 MG 24 hr tablet Take 10 mg by mouth at bedtime.   Yes [provider]  oxyCODONE-acetaminophen (PERCOCET) 10-325 MG tablet Take 1 tablet by mouth every 6 (six) hours as needed for pain.   Yes [provider]  pantoprazole (PROTONIX) 40 MG tablet Take 1 tablet (40 mg total) by mouth 2 (two) times daily. Patient taking differently: Take 40 mg by mouth at bedtime. 10/31/13  Yes Lysbeth Penner, FNP  piperacillin-tazobactam (ZOSYN) 4-0.5 GM/100ML SOLN IVPB Inject 4.5 g into the vein See admin instructions. Zosyn 4.5 gram in 100 ml normal saline 0.9% at 200 ml/hr - ONCE   Yes [provider]  potassium chloride SA (KLOR-CON M) 20 MEQ tablet Take 20 mEq by mouth daily. 12/18/21  Yes [provider]  pravastatin (PRAVACHOL) 40 MG tablet Take 40 mg by mouth at bedtime. 02/26/22  Yes [provider]  sodium chloride 0.9 % Inject 2,436 mLs into the vein See admin instructions. Sodium Chloride 0.9% bolus, 2436 ml over 1 hour for 1 dose.   Yes [provider]   tamsulosin (FLOMAX) 0.4 MG CAPS capsule Take 0.4 mg by mouth at bedtime. 02/06/22  Yes [provider]  traZODone (DESYREL) 100 MG tablet TAKE ONE TABLET BY MOUTH AT BEDTIME AS NEEDED FOR SLEEP Patient taking differently: Take 100 mg by mouth at bedtime as needed for sleep. 03/15/14  Yes Lysbeth Penner, FNP  diltiazem (TIADYLT ER) 120 MG 24 hr capsule Take 120 mg by mouth daily. Patient not taking: Reported on 03/03/2022    [provider]    Inpatient Medications: Scheduled Meds:  Chlorhexidine Gluconate Cloth  6 each Topical Daily   cyanocobalamin  1,000 mcg Oral Daily   enoxaparin (LOVENOX) injection  40 mg Subcutaneous Q24H   fluticasone  2 spray Each Nare Daily   fluticasone furoate-vilanterol  1 puff Inhalation Daily   And   umeclidinium bromide  1 puff Inhalation Daily  gabapentin  300 mg Oral TID   insulin aspart  0-15 Units Subcutaneous TID WC   lidocaine  1 patch Transdermal Q24H   methocarbamol  500 mg Oral QHS   pantoprazole  40 mg Oral BID   Continuous Infusions:  sodium chloride 10 mL/hr at 03/04/22 0600   piperacillin-tazobactam (ZOSYN)  IV 3.375 g (03/04/22 1015)   PRN Meds: sodium chloride, albuterol, docusate sodium, morphine injection, ondansetron (ZOFRAN) IV, mouth rinse, oxyCODONE, polyethylene glycol, traZODone  Allergies:    Allergies  Allergen Reactions   Aspirin Hives and Itching   Fish Allergy Nausea And Vomiting   Onion Nausea And Vomiting   Other Other (See Comments)    Just doesn't like [place]    Tape Other (See Comments)    Skin very sensitive, easily tears   Levaquin [Levofloxacin In D5w] Rash   Neosporin [Neomycin-Bacitracin Zn-Polymyx] Rash    Social History:   Social History   Socioeconomic History   Marital status: Married    Spouse name: Not on file   Number of children: Not on file   Years of education: Not on file   Highest education level: Not on file  Occupational History   Not on file  Tobacco Use    Smoking status: Former    Packs/day: 1.00    Years: 37.00    Total pack years: 37.00    Types: Cigarettes    Start date: 12/30/1953    Quit date: 12/28/1990    Years since quitting: 31.2   Smokeless tobacco: Former    Quit date: 06/30/1983  Vaping Use   Vaping Use: Never used  Substance and Sexual Activity   Alcohol use: Yes    Alcohol/week: 0.0 standard drinks of alcohol    Comment: occasional   Drug use: No   Sexual activity: Not on file  Other Topics Concern   Not on file  Social History Narrative   Not on file   Social Determinants of Health   Financial Resource Strain: Not on file  Food Insecurity: Not on file  Transportation Needs: Not on file  Physical Activity: Not on file  Stress: Not on file  Social Connections: Not on file  Intimate Partner Violence: Not on file    Family History:   Family History  Problem Relation Age of Onset   Cancer Mother    Diabetes Mother    COPD Mother    Cancer Sister        brain   Cancer Sister        brain     ROS:  Please see the history of present illness.  All other ROS reviewed and negative.     Physical Exam/Data:   Vitals:   03/04/22 1108 03/04/22 1200 03/04/22 1300 03/04/22 1520  BP:  125/66    Pulse:  77 87   Resp:  (!) 27 (!) 26   Temp: 98.5 F (36.9 C)   97.9 F (36.6 C)  TempSrc: Oral   Axillary  SpO2:  92% 91%   Weight:      Height:        Intake/Output Summary (Last 24 hours) at 03/04/2022 1628 Last data filed at 03/04/2022 0622 Gross per 24 hour  Intake 205.83 ml  Output 2000 ml  Net -1794.17 ml      03/02/2022    3:45 PM 04/17/2021   12:58 PM 09/05/2020    9:49 AM  Last 3 Weights  Weight (lbs) 178 lb 12.7 oz 210 lb  3.2 oz 211 lb  Weight (kg) 81.1 kg 95.346 kg 95.709 kg     Body mass index is 28 kg/m.  General:  Well nourished, well developed, in no acute distress HEENT: normal Neck: no JVD Vascular: Distal pulses 2+ bilaterally Cardiac:  RRR, systolic murmur  Lungs:  clear to auscultation  bilaterally, no wheezing, rhonchi or rales  Abd: soft, mild tenderness near epigastrium, healed midline incisional wound  Ext: no edema Musculoskeletal:  No deformities Skin: warm and dry  Neuro:  alert and oriented  Psych:  Normal affect   EKG:  The EKG was personally reviewed and demonstrates: sinus rhythm, HR 78, left axis deviation, normal intervals Telemetry:  Telemetry was personally reviewed and demonstrates:  NSR, few PVCs noted  Relevant CV Studies: 9/4 Echo  1. Left ventricular ejection fraction, by estimation, is 60 to 65%. The  left ventricle has normal function. The left ventricle has no regional  wall motion abnormalities. There is moderate left ventricular hypertrophy.  Left ventricular diastolic  parameters are consistent with Grade I diastolic dysfunction (impaired  relaxation).   2. Right ventricular systolic function is normal. The right ventricular  size is normal. Tricuspid regurgitation signal is inadequate for assessing  PA pressure.   3. The mitral valve is abnormal. Trivial mitral valve regurgitation.  Moderate mitral annular calcification.   4. The aortic valve is tricuspid. There is mild calcification of the  aortic valve. Aortic valve regurgitation is not visualized. Mild to  moderate aortic valve stenosis. Aortic valve area, by VTI measures 1.47  cm. Aortic valve mean gradient measures 8.0   mmHg. Aortic valve Vmax measures 2.06 m/s.   5. The inferior vena cava is dilated in size with >50% respiratory  variability, suggesting right atrial pressure of 8 mmHg.   Laboratory Data:  High Sensitivity Troponin:  No results for input(s): "TROPONINIHS" in the last 720 hours.   Chemistry Recent Labs  Lab 03/02/22 1634 03/03/22 0005 03/03/22 0555 03/04/22 0527  NA 141 138  --  138  K 5.0 4.0  --  4.2  CL 109 107  --  106  CO2 23 23  --  26  GLUCOSE 137* 99  --  111*  BUN 12 12  --  9  CREATININE 1.20 0.98  --  0.98  CALCIUM 8.3* 8.3*  --  8.5*  MG   --  1.3* 2.3 1.9  GFRNONAA >60 >60  --  >60  ANIONGAP 9 8  --  6    Recent Labs  Lab 03/02/22 1634 03/03/22 0555 03/04/22 0527  PROT 5.2* 5.4* 5.0*  ALBUMIN 2.8* 2.6* 2.4*  AST 291* 152* 72*  ALT 329* 264* 184*  ALKPHOS 97 88 103  BILITOT 4.1* 4.6* 2.9*   Lipids No results for input(s): "CHOL", "TRIG", "HDL", "LABVLDL", "LDLCALC", "CHOLHDL" in the last 168 hours.  Hematology Recent Labs  Lab 03/03/22 0005 03/04/22 0527  WBC 10.5 6.3  RBC 4.25 4.09*  HGB 13.0 12.5*  HCT 39.6 37.6*  MCV 93.2 91.9  MCH 30.6 30.6  MCHC 32.8 33.2  RDW 13.9 13.9  PLT 156 154   Thyroid No results for input(s): "TSH", "FREET4" in the last 168 hours.  BNPNo results for input(s): "BNP", "PROBNP" in the last 168 hours.  DDimer No results for input(s): "DDIMER" in the last 168 hours.   Radiology/Studies:  CT ABDOMEN PELVIS W WO CONTRAST  Result Date: 03/03/2022 CLINICAL DATA:  Acute nonlocalized abdominal pain. EXAM: CT ABDOMEN AND PELVIS  WITHOUT AND WITH CONTRAST TECHNIQUE: Multidetector CT imaging of the abdomen and pelvis was performed following the standard protocol before and following the bolus administration of intravenous contrast. RADIATION DOSE REDUCTION: This exam was performed according to the departmental dose-optimization program which includes automated exposure control, adjustment of the mA and/or kV according to patient size and/or use of iterative reconstruction technique. CONTRAST:  170m OMNIPAQUE IOHEXOL 300 MG/ML  SOLN COMPARISON:  None Available. FINDINGS: Lower chest: Centrilobular emphsyema noted.Calcified and noncalcified micro nodularity noted right lower lobe with areas of subsegmental atelectasis at the right base. Hepatobiliary: Tiny hypodensities in the right liver are too small to characterize but likely benign. Gallstones measure up to 12 mm diameter. Despite the lack of biliary dilatation, an 8 mm stone is identified in the common bile duct (axial 24/6 and coronal 44/12).  Pancreas: No focal mass lesion. No dilatation of the main duct. No intraparenchymal cyst. No peripancreatic edema. Spleen: Calcified granulomata. Adrenals/Urinary Tract: No adrenal nodule or mass. 3 nonobstructing stones are seen in the right kidney measuring up to 10 mm in the lower pole region. Bilateral renal cysts evident. No evidence for hydroureter. The urinary bladder appears normal for the degree of distention. Stomach/Bowel: Stomach is unremarkable. No gastric wall thickening. No evidence of outlet obstruction. Duodenum is normally positioned as is the ligament of Treitz. Duodenal diverticulum noted. No small bowel wall thickening. No small bowel dilatation. Status post right hemicolectomy. Diverticular changes are noted in the left colon without evidence of diverticulitis. Vascular/Lymphatic: There is moderate atherosclerotic calcification of the abdominal aorta without aneurysm. There is no gastrohepatic or hepatoduodenal ligament lymphadenopathy. No retroperitoneal or mesenteric lymphadenopathy. No pelvic sidewall lymphadenopathy. Reproductive: The prostate gland and seminal vesicles are unremarkable. Other: No intraperitoneal free fluid. 4.4 x 1.0 cm focus of abnormal soft tissue is identified in the midline anterior abdomen, involving the omentum/anterior peritoneum (31/3). Musculoskeletal: No worrisome lytic or sclerotic osseous abnormality. Lumbar fusion hardware evident. 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 Midline wide-mouth hernia contains small bowel without complicating features (323/5. IMPRESSION: 1. Cholelithiasis with choledocholithiasis. 8 mm stone in the common bile duct without biliary dilatation. 2. 4.4 x 1.0 cm focus of soft tissue density in the omentum/peritoneum of the anterior midline abdominal wall. This may be granulation tissue/scarring related to prior surgery, but follow-up recommended to ensure  stability, especially if the patient has a cancer history. Consider follow-up CT in 3 months. Alternatively the patient has prior CT imaging of the abdomen, comparison to those older studies to assess chronicity would likely prove helpful. 3. Nonobstructing right nephrolithiasis. 4. Left colonic diverticulosis without diverticulitis. 5. Calcified and noncalcified micro nodularity right lower lobe, likely sequelae of infection/inflammation or potential prior aspiration. 6. Aortic Atherosclerosis (ICD10-I70.0) and Emphysema (ICD10-J43.9). Electronically Signed   By: EMisty StanleyM.D.   On: 03/03/2022 08:20   ECHOCARDIOGRAM COMPLETE  Result Date: 03/02/2022    ECHOCARDIOGRAM REPORT   Patient Name:   Herbert WERNETTEDate of Exam: 03/02/2022 Medical Rec #:  [id number]        Height:       67.0 in Accession #:    [id number]       Weight:       178.8 lb Date of Birth:  710-12-1940         BSA:          1.928 m Patient Age:    89years  BP:           128/64 mmHg Patient Gender: M                 HR:           78 bpm. Exam Location:  Inpatient Procedure: 2D Echo, Cardiac Doppler, Color Doppler and Intracardiac            Opacification Agent Indications:    Pre-operative evaluation  History:        Patient has prior history of Echocardiogram examinations, most                 recent 09/04/2015. CAD, COPD, Aortic Valve Disease; Risk                 Factors:Hypertension, Dyslipidemia, Former Smoker and Diabetes.                 Shock.  Sonographer:    Clayton Lefort RDCS (AE) Referring Phys: [id number] Candee Furbish  Sonographer Comments: Suboptimal parasternal window and Technically difficult study due to poor echo windows. Image acquisition challenging due to COPD. IMPRESSIONS  1. Left ventricular ejection fraction, by estimation, is 60 to 65%. The left ventricle has normal function. The left ventricle has no regional wall motion abnormalities. There is moderate left ventricular hypertrophy. Left ventricular diastolic  parameters are consistent with Grade I diastolic dysfunction (impaired relaxation).  2. Right ventricular systolic function is normal. The right ventricular size is normal. Tricuspid regurgitation signal is inadequate for assessing PA pressure.  3. The mitral valve is abnormal. Trivial mitral valve regurgitation. Moderate mitral annular calcification.  4. The aortic valve is tricuspid. There is mild calcification of the aortic valve. Aortic valve regurgitation is not visualized. Mild to moderate aortic valve stenosis. Aortic valve area, by VTI measures 1.47 cm. Aortic valve mean gradient measures 8.0  mmHg. Aortic valve Vmax measures 2.06 m/s.  5. The inferior vena cava is dilated in size with >50% respiratory variability, suggesting right atrial pressure of 8 mmHg. Comparison(s): Changes from prior study are noted. 09/04/2015: LVEF 55-60%, aortic sclerosis without stenosis. FINDINGS  Left Ventricle: Left ventricular ejection fraction, by estimation, is 60 to 65%. The left ventricle has normal function. The left ventricle has no regional wall motion abnormalities. Definity contrast agent was given IV to delineate the left ventricular  endocardial borders. The left ventricular internal cavity size was normal in size. There is moderate left ventricular hypertrophy. Left ventricular diastolic parameters are consistent with Grade I diastolic dysfunction (impaired relaxation). Indeterminate filling pressures. Right Ventricle: The right ventricular size is normal. No increase in right ventricular wall thickness. Right ventricular systolic function is normal. Tricuspid regurgitation signal is inadequate for assessing PA pressure. Left Atrium: Left atrial size was normal in size. Right Atrium: Right atrial size was normal in size. Pericardium: There is no evidence of pericardial effusion. Mitral Valve: The mitral valve is abnormal. Moderate mitral annular calcification. Trivial mitral valve regurgitation. Tricuspid Valve:  The tricuspid valve is normal in structure. Tricuspid valve regurgitation is not demonstrated. Aortic Valve: The aortic valve is tricuspid. There is mild calcification of the aortic valve. Aortic valve regurgitation is not visualized. Mild to moderate aortic stenosis is present. Aortic valve mean gradient measures 8.0 mmHg. Aortic valve peak gradient measures 17.0 mmHg. Aortic valve area, by VTI measures 1.47 cm. Pulmonic Valve: The pulmonic valve was not well visualized. Pulmonic valve regurgitation is not visualized. Aorta: The aortic root and ascending aorta are structurally normal,  with no evidence of dilitation. Venous: The inferior vena cava is dilated in size with greater than 50% respiratory variability, suggesting right atrial pressure of 8 mmHg. IAS/Shunts: No atrial level shunt detected by color flow Doppler.  LEFT VENTRICLE PLAX 2D LVIDd:         3.80 cm   Diastology LVIDs:         2.70 cm   LV e' medial:    6.64 cm/s LV PW:         1.60 cm   LV E/e' medial:  13.0 LV IVS:        1.40 cm   LV e' lateral:   10.40 cm/s LVOT diam:     2.20 cm   LV E/e' lateral: 8.3 LV SV:         59 LV SV Index:   31 LVOT Area:     3.80 cm  RIGHT VENTRICLE             IVC RV Basal diam:  3.30 cm     IVC diam: 2.50 cm RV S prime:     13.10 cm/s TAPSE (M-mode): 1.5 cm LEFT ATRIUM             Index        RIGHT ATRIUM           Index LA diam:        3.20 cm 1.66 cm/m   RA Area:     20.20 cm LA Vol (A2C):   54.8 ml 28.42 ml/m  RA Volume:   64.40 ml  33.40 ml/m LA Vol (A4C):   37.9 ml 19.66 ml/m LA Biplane Vol: 49.2 ml 25.52 ml/m  AORTIC VALVE AV Area (Vmax):    1.35 cm AV Area (Vmean):   1.44 cm AV Area (VTI):     1.47 cm AV Vmax:           206.00 cm/s AV Vmean:          135.000 cm/s AV VTI:            0.402 m AV Peak Grad:      17.0 mmHg AV Mean Grad:      8.0 mmHg LVOT Vmax:         73.10 cm/s LVOT Vmean:        51.300 cm/s LVOT VTI:          0.155 m LVOT/AV VTI ratio: 0.39  AORTA Ao Root diam: 3.60 cm Ao Asc diam:   3.50 cm MITRAL VALVE MV Area (PHT): 3.17 cm    SHUNTS MV Decel Time: 239 msec    Systemic VTI:  0.16 m MV E velocity: 86.50 cm/s  Systemic Diam: 2.20 cm MV A velocity: 81.80 cm/s MV E/A ratio:  1.06 Herbert Bishop MD Electronically signed by Herbert Bishop MD Signature Date/Time: 03/02/2022/6:21:49 PM    Final      Assessment and Plan:   CAD w/ 3 stents Upon chart review, last stent placed in 2008, proximal LAD and previous stent of mid-LAD noted. Last dose of Plavix on 9/3. He has adequate functional capacity prior to admission. Able to climb stairs and perform household chores without cardiac symptoms. Recent echo showed normal EF. EKG unremarkable. From cardiology standpoint, he is cleared for surgery.  - continue holding Plavix for ERCP and possible cholecystectomy    For questions or updates, please contact [place] Please consult [email or web address] for contact info under    Signed, Angelique Blonder,  DO  03/04/2022 4:28 PM   I have examined the patient and reviewed assessment and plan and discussed with patient.  Agree with above as stated.    Interesting history with this patient who has a diagnosis of coronary artery disease.  He was treated with PCI, a total of 3 stents, while living in [place] in 2008.  He told me that the physician who treated him lost his medical license due to placing inappropriate stents.  He cannot remember the name of the doctor but it may have been Dr. Fredda Hammed, as this was a well-known case in [place] around 2010.  The patient states he received $37,000 as a settlement.  He has not had any cardiac cath or stents since that time.  Prior to his gallbladder issues, he has been active around the house.  He mops.  He mows a large yard.  He walks up stairs without any chest discomfort or shortness of breath.  He does have a history of tobacco abuse.  He uses inhalers on occasion.  On exam, he has a soft systolic murmur.  Air movement in lungs seemed reduced.  Echocardiogram showed mild to moderate aortic stenosis.  Normal LVEF.  ECG does not show any acute ischemic changes.  From a cardiac standpoint, no further testing is needed before surgery.  He was able to complete 4 METS of activity without cardiac symptoms.  Based on exam, I do think he has some underlying pulmonary disease.  His pulmonary status may need to be optimized prior to surgery to lower his risk.    Larae Grooms

## 2022-03-04 NOTE — Progress Notes (Addendum)
Daily Rounding Note  03/04/2022, 3:23 PM  LOS: 2 days   SUBJECTIVE:   Chief complaint:   choledocholithiasis   All previous abd pain resolved.  Tolerating full liquids.  Miffed that he was made NPO from yesterday to mid day today.  Aware that ERCP is set for tmrw.    OBJECTIVE:         Vital signs in last 24 hours:    Temp:  [97.9 F (36.6 C)-98.7 F (37.1 C)] 97.9 F (36.6 C) (09/06 1520) Pulse Rate:  [68-94] 87 (09/06 1300) Resp:  [19-30] 26 (09/06 1300) BP: (97-148)/(48-78) 125/66 (09/06 1200) SpO2:  [89 %-95 %] 91 % (09/06 1300) Last BM Date : 03/02/22 Filed Weights   03/02/22 1545  Weight: 81.1 kg   General: elderly, frail but spunky and alert   Heart: RRR Chest: active cough.  No dyspnea Abdomen: soft, NT, ND.  BS active  Neuro/Psych:  oriented x 3.  Talkative, fluid speech.  No gross deficits.    Intake/Output from previous day: 09/05 0701 - 09/06 0700 In: 628.7 [P.O.:360; I.V.:118.9; IV Piggyback:149.8] Out: 2925 [Urine:2925]  Intake/Output this shift: No intake/output data recorded.  Lab Results: Recent Labs    03/03/22 0005 03/04/22 0527  WBC 10.5 6.3  HGB 13.0 12.5*  HCT 39.6 37.6*  PLT 156 154   BMET Recent Labs    03/02/22 1634 03/03/22 0005 03/04/22 0527  NA 141 138 138  K 5.0 4.0 4.2  CL 109 107 106  CO2 '23 23 26  '$ GLUCOSE 137* 99 111*  BUN '12 12 9  '$ CREATININE 1.20 0.98 0.98  CALCIUM 8.3* 8.3* 8.5*   LFT Recent Labs    03/02/22 1634 03/03/22 0555 03/04/22 0527  PROT 5.2* 5.4* 5.0*  ALBUMIN 2.8* 2.6* 2.4*  AST 291* 152* 72*  ALT 329* 264* 184*  ALKPHOS 97 88 103  BILITOT 4.1* 4.6* 2.9*  BILIDIR  --  3.0*  --   IBILI  --  1.6*  --    PT/INR No results for input(s): "LABPROT", "INR" in the last 72 hours. Hepatitis Panel No results for input(s): "HEPBSAG", "HCVAB", "HEPAIGM", "HEPBIGM" in the last 72 hours.  Studies/Results: CT ABDOMEN PELVIS W WO  CONTRAST  Result Date: 03/03/2022 CLINICAL DATA:  Acute nonlocalized abdominal pain. EXAM: CT ABDOMEN AND PELVIS WITHOUT AND WITH CONTRAST TECHNIQUE: Multidetector CT imaging of the abdomen and pelvis was performed following the standard protocol before and following the bolus administration of intravenous contrast. RADIATION DOSE REDUCTION: This exam was performed according to the departmental dose-optimization program which includes automated exposure control, adjustment of the mA and/or kV according to patient size and/or use of iterative reconstruction technique. CONTRAST:  123m OMNIPAQUE IOHEXOL 300 MG/ML  SOLN COMPARISON:  None Available. FINDINGS: Lower chest: Centrilobular emphsyema noted.Calcified and noncalcified micro nodularity noted right lower lobe with areas of subsegmental atelectasis at the right base. Hepatobiliary: Tiny hypodensities in the right liver are too small to characterize but likely benign. Gallstones measure up to 12 mm diameter. Despite the lack of biliary dilatation, an 8 mm stone is identified in the common bile duct (axial 24/6 and coronal 44/12). Pancreas: No focal mass lesion. No dilatation of the main duct. No intraparenchymal cyst. No peripancreatic edema. Spleen: Calcified granulomata. Adrenals/Urinary Tract: No adrenal nodule or mass. 3 nonobstructing stones are seen in the right kidney measuring up to 10 mm in the lower pole region. Bilateral renal cysts evident. No evidence  for hydroureter. The urinary bladder appears normal for the degree of distention. Stomach/Bowel: Stomach is unremarkable. No gastric wall thickening. No evidence of outlet obstruction. Duodenum is normally positioned as is the ligament of Treitz. Duodenal diverticulum noted. No small bowel wall thickening. No small bowel dilatation. Status post right hemicolectomy. Diverticular changes are noted in the left colon without evidence of diverticulitis. Vascular/Lymphatic: There is moderate atherosclerotic  calcification of the abdominal aorta without aneurysm. There is no gastrohepatic or hepatoduodenal ligament lymphadenopathy. No retroperitoneal or mesenteric lymphadenopathy. No pelvic sidewall lymphadenopathy. Reproductive: The prostate gland and seminal vesicles are unremarkable. Other: No intraperitoneal free fluid. 4.4 x 1.0 cm focus of abnormal soft tissue is identified in the midline anterior abdomen, involving the omentum/anterior peritoneum (31/3). Musculoskeletal: No worrisome lytic or sclerotic osseous abnormality. Lumbar fusion hardware evident. 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 Midline wide-mouth hernia contains small bowel without complicating features (20/9). IMPRESSION: 1. Cholelithiasis with choledocholithiasis. 8 mm stone in the common bile duct without biliary dilatation. 2. 4.4 x 1.0 cm focus of soft tissue density in the omentum/peritoneum of the anterior midline abdominal wall. This may be granulation tissue/scarring related to prior surgery, but follow-up recommended to ensure stability, especially if the patient has a cancer history. Consider follow-up CT in 3 months. Alternatively the patient has prior CT imaging of the abdomen, comparison to those older studies to assess chronicity would likely prove helpful. 3. Nonobstructing right nephrolithiasis. 4. Left colonic diverticulosis without diverticulitis. 5. Calcified and noncalcified micro nodularity right lower lobe, likely sequelae of infection/inflammation or potential prior aspiration. 6. Aortic Atherosclerosis (ICD10-I70.0) and Emphysema (ICD10-J43.9). Electronically Signed   By: Misty Stanley M.D.   On: 03/03/2022 08:20   ECHOCARDIOGRAM COMPLETE  Result Date: 03/02/2022    ECHOCARDIOGRAM REPORT   Patient Name:   Herbert May Date of Exam: 03/02/2022 Medical Rec #:  [id number]         Height:       67.0 in Accession #:    [id number]        Weight:        178.8 lb Date of Birth:  1941/05/10          BSA:          1.928 m Patient Age:    81 years          BP:           128/64 mmHg Patient Gender: M                 HR:           78 bpm. Exam Location:  Inpatient Procedure: 2D Echo, Cardiac Doppler, Color Doppler and Intracardiac            Opacification Agent Indications:    Pre-operative evaluation  History:        Patient has prior history of Echocardiogram examinations, most                 recent 09/04/2015. CAD, COPD, Aortic Valve Disease; Risk                 Factors:Hypertension, Dyslipidemia, Former Smoker and Diabetes.                 Shock.  Sonographer:    Clayton Lefort RDCS (AE) Referring Phys: [id number] Candee Furbish  Sonographer Comments: Suboptimal parasternal window and Technically difficult study due to poor echo  windows. Image acquisition challenging due to COPD. IMPRESSIONS  1. Left ventricular ejection fraction, by estimation, is 60 to 65%. The left ventricle has normal function. The left ventricle has no regional wall motion abnormalities. There is moderate left ventricular hypertrophy. Left ventricular diastolic parameters are consistent with Grade I diastolic dysfunction (impaired relaxation).  2. Right ventricular systolic function is normal. The right ventricular size is normal. Tricuspid regurgitation signal is inadequate for assessing PA pressure.  3. The mitral valve is abnormal. Trivial mitral valve regurgitation. Moderate mitral annular calcification.  4. The aortic valve is tricuspid. There is mild calcification of the aortic valve. Aortic valve regurgitation is not visualized. Mild to moderate aortic valve stenosis. Aortic valve area, by VTI measures 1.47 cm. Aortic valve mean gradient measures 8.0  mmHg. Aortic valve Vmax measures 2.06 m/s.  5. The inferior vena cava is dilated in size with >50% respiratory variability, suggesting right atrial pressure of 8 mmHg. Comparison(s): Changes from prior study are noted. 09/04/2015: LVEF 55-60%,  aortic sclerosis without stenosis. FINDINGS  Left Ventricle: Left ventricular ejection fraction, by estimation, is 60 to 65%. The left ventricle has normal function. The left ventricle has no regional wall motion abnormalities. Definity contrast agent was given IV to delineate the left ventricular  endocardial borders. The left ventricular internal cavity size was normal in size. There is moderate left ventricular hypertrophy. Left ventricular diastolic parameters are consistent with Grade I diastolic dysfunction (impaired relaxation). Indeterminate filling pressures. Right Ventricle: The right ventricular size is normal. No increase in right ventricular wall thickness. Right ventricular systolic function is normal. Tricuspid regurgitation signal is inadequate for assessing PA pressure. Left Atrium: Left atrial size was normal in size. Right Atrium: Right atrial size was normal in size. Pericardium: There is no evidence of pericardial effusion. Mitral Valve: The mitral valve is abnormal. Moderate mitral annular calcification. Trivial mitral valve regurgitation. Tricuspid Valve: The tricuspid valve is normal in structure. Tricuspid valve regurgitation is not demonstrated. Aortic Valve: The aortic valve is tricuspid. There is mild calcification of the aortic valve. Aortic valve regurgitation is not visualized. Mild to moderate aortic stenosis is present. Aortic valve mean gradient measures 8.0 mmHg. Aortic valve peak gradient measures 17.0 mmHg. Aortic valve area, by VTI measures 1.47 cm. Pulmonic Valve: The pulmonic valve was not well visualized. Pulmonic valve regurgitation is not visualized. Aorta: The aortic root and ascending aorta are structurally normal, with no evidence of dilitation. Venous: The inferior vena cava is dilated in size with greater than 50% respiratory variability, suggesting right atrial pressure of 8 mmHg. IAS/Shunts: No atrial level shunt detected by color flow Doppler.  LEFT VENTRICLE PLAX  2D LVIDd:         3.80 cm   Diastology LVIDs:         2.70 cm   LV e' medial:    6.64 cm/s LV PW:         1.60 cm   LV E/e' medial:  13.0 LV IVS:        1.40 cm   LV e' lateral:   10.40 cm/s LVOT diam:     2.20 cm   LV E/e' lateral: 8.3 LV SV:         59 LV SV Index:   31 LVOT Area:     3.80 cm  RIGHT VENTRICLE             IVC RV Basal diam:  3.30 cm     IVC diam: 2.50 cm RV  S prime:     13.10 cm/s TAPSE (M-mode): 1.5 cm LEFT ATRIUM             Index        RIGHT ATRIUM           Index LA diam:        3.20 cm 1.66 cm/m   RA Area:     20.20 cm LA Vol (A2C):   54.8 ml 28.42 ml/m  RA Volume:   64.40 ml  33.40 ml/m LA Vol (A4C):   37.9 ml 19.66 ml/m LA Biplane Vol: 49.2 ml 25.52 ml/m  AORTIC VALVE AV Area (Vmax):    1.35 cm AV Area (Vmean):   1.44 cm AV Area (VTI):     1.47 cm AV Vmax:           206.00 cm/s AV Vmean:          135.000 cm/s AV VTI:            0.402 m AV Peak Grad:      17.0 mmHg AV Mean Grad:      8.0 mmHg LVOT Vmax:         73.10 cm/s LVOT Vmean:        51.300 cm/s LVOT VTI:          0.155 m LVOT/AV VTI ratio: 0.39  AORTA Ao Root diam: 3.60 cm Ao Asc diam:  3.50 cm MITRAL VALVE MV Area (PHT): 3.17 cm    SHUNTS MV Decel Time: 239 msec    Systemic VTI:  0.16 m MV E velocity: 86.50 cm/s  Systemic Diam: 2.20 cm MV A velocity: 81.80 cm/s MV E/A ratio:  1.06 Lyman Bishop MD Electronically signed by Lyman Bishop MD Signature Date/Time: 03/02/2022/6:21:49 PM    Final     Scheduled Meds:  Chlorhexidine Gluconate Cloth  6 each Topical Daily   cyanocobalamin  1,000 mcg Oral Daily   enoxaparin (LOVENOX) injection  40 mg Subcutaneous Q24H   fluticasone  2 spray Each Nare Daily   fluticasone furoate-vilanterol  1 puff Inhalation Daily   And   umeclidinium bromide  1 puff Inhalation Daily   gabapentin  300 mg Oral TID   insulin aspart  0-15 Units Subcutaneous TID WC   lidocaine  1 patch Transdermal Q24H   methocarbamol  500 mg Oral QHS   pantoprazole  40 mg Oral BID   Continuous Infusions:   sodium chloride 10 mL/hr at 03/04/22 0600   piperacillin-tazobactam (ZOSYN)  IV 3.375 g (03/04/22 1015)   PRN Meds:.sodium chloride, albuterol, docusate sodium, morphine injection, ondansetron (ZOFRAN) IV, mouth rinse, oxyCODONE, polyethylene glycol, traZODone  ASSESMENT:   Choledocholithiasis.  Acute cholangitis.  Cholecystitis.  Ongoing Zosyn. LFTs all downtrending.  Leukocytosis resolved.  No fevers.  Chronic Plavix, on hold, last dose 9/3.  Indication CAD.   PLAN     ERCP set for 1PM tmrw.  Post ERCP NSAID PR not ordered as contraindication to ASA causing Hives, itching.       Azucena Freed  03/04/2022, 3:23 PM Phone [phone]    Attending physician's note   I have taken a history, reviewed the chart and examined the patient. I performed a substantive portion of this encounter, including complete performance of at least one of the key components, in conjunction with the APP. I agree with the APP's note, impression and recommendations.    Choledocholithiasis with acute cholangitis.  Hemodynamically stable On IV Zosyn Last dose of Plavix  on September 3 Plan for ERCP tomorrow by Dr. Fuller Plan   The patient was provided an opportunity to ask questions and all were answered. The patient agreed with the plan and demonstrated an understanding of the instructions.   Damaris Hippo , MD [phone]

## 2022-03-04 NOTE — Progress Notes (Signed)
New Admission Note:  Arrival Method: Wheelchair, transfer from 3 M Mental Orientation: Alert and oriented x 4 Telemetry: Box 11 NSR Assessment: Completed Skin: Warm and dry  IV: NSL x 2 Pain: 10/6 back  Tubes: Primofit Safety Measures: Safety Fall Prevention Plan initiated.  Admission: Completed 5 M  Orientation: Patient has been orientated to the room, unit and the staff. Welcome booklet given.  Family: N/A  Orders have been reviewed and implemented. Will continue to monitor the patient. Call light has been placed within reach and bed alarm has been activated.   Sima Matas BSN, RN  Phone Number: [phone]

## 2022-03-04 NOTE — Progress Notes (Signed)
Progress Note Patient: Herbert May ATF:[id number] DOB: 01/27/1941 DOA: 03/02/2022  DOS: the patient was seen and examined on 03/04/2022  Brief hospital course: 81 year old male with PMH of CAD SP PCI, type II DM, HTN, HLD, GERD, COPD, chronic hypoxic respiratory failure on 3 to 4 L of oxygen, former smoker.  Presented to the hospital with complaints of back pain progressively worsening to involve the front of the abdomen.  Found to have acute liver injury with ultrasound showing evidence of gallbladder thickening and stone and normal CBD.  Patient was in septic shock hock at [facility], was originally admitted at ICU at [facility].  GI cardiology and general surgery following.  PCCM signed off. Assessment and Plan: Septic shock present on admission.  Secondary to acute cholangitis and cholecystitis Presents at [facility] with back pain worsening abdominal pain.  Was hypotensive at arrival and was started on Levophed after hydration. Cause of septic shock is is a new cholangitis and cholecystitis. Started on IV Zosyn. We will continue the medicine for now. GI following. ERCP scheduled on 9/7.  N.p.o. after midnight. Currently no fever.  LFT trending down.  Choledocholithiasis Cholelithiasis CBD stone. Elevated LFT and bilirubin. General surgery following. CT scan shows 8 mm stone in the CBD. Scheduled for ERCP. Plavix on hold since September 3. General surgery recommends open cholecystectomy given his history of incisional hernia. For now continue with IV antibiotics. Avoid hepatotoxic medications.  CAD SP PCI Moderate aortic stenosis Diastolic dysfunction Cardiology consulted. Appreciate assistance.  Preoperatively no further work-up necessary. Plavix on hold since September 3.  COPD Does not appear to have any exacerbation right now. Continue inhalers.  Chronic pain on opioids. Patient is on fentanyl patch 100 mcg at home along with Percocet. We will continue the same  medications.  Type 2 diabetes mellitus, controlled without any long-term insulin use with HLD Continue sliding scale insulin. Reason hemoglobin A1c 6.1.  HLD. Holding statin right now.  GERD. Continue PPI. Dose increased to twice daily for now. Management per GI pending ERCP.  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 Management per surgery.  4.4 x 1 cm soft tissue density in omentum Repeat CT scan in 3 months.  Subjective: Abdominal pain stable.  No nausea no vomiting no fever no chills.  Passing gas.  No diarrhea.  No shortness of breath.  Physical Exam: Vitals:   03/04/22 1520 03/04/22 1600 03/04/22 1700 03/04/22 1800  BP:  132/72 94/80 105/85  Pulse:  (!) 104 95 (!) 101  Resp:  16 (!) 23 (!) 23  Temp: 97.9 F (36.6 C)     TempSrc: Axillary     SpO2:  93% 92% 94%  Weight:      Height:       General: Appear in mild distress; no visible Abnormal Neck Mass Or lumps, Conjunctiva normal Cardiovascular: S1 and S2 Present, aortic systolic Murmur, Respiratory: good respiratory effort, Bilateral Air entry present and CTA, no Crackles, no wheezes Abdomen: Bowel Sound present, mild diffuse tenderness Extremities: Trace pedal edema Neurology: alert and oriented to time, place, and person  Gait not checked due to patient safety concerns   Data Reviewed: I have Reviewed nursing notes, Vitals, and Lab results since pt's last encounter. Pertinent lab results CBC and CMP I have ordered test including CBC and CMP I have discussed pt's care plan and test results with general surgery.  Reviewed notes from cardiology and GI.  Family Communication: No one at  bedside  Disposition: Status is: Inpatient Remains inpatient appropriate because: Needing ERCP followed by open cholecystectomy   Author: Berle Mull, MD 03/04/2022 7:07 PM  Please look on [email or web address] to find out who is on call.

## 2022-03-04 NOTE — Hospital Course (Addendum)
81 year old male with PMH of CAD SP PCI, type II DM, HTN, HLD, GERD, COPD, chronic hypoxic respiratory failure on 3 to 4 L of oxygen, former smoker.  Presented to the hospital with complaints of back pain progressively worsening to involve the front of the abdomen.  Found to have acute liver injury with ultrasound showing evidence of gallbladder thickening and stone and normal CBD.  Patient was in septic shock hock at [facility], was originally admitted at ICU at [facility].  GI cardiology and general surgery following.  PCCM signed off. GI signed off. General surgery recommending conservative management with antibiotics only.

## 2022-03-04 NOTE — H&P (View-Only) (Signed)
Daily Rounding Note  03/04/2022, 3:23 PM  LOS: 2 days   SUBJECTIVE:   Chief complaint:   choledocholithiasis   All previous abd pain resolved.  Tolerating full liquids.  Miffed that he was made NPO from yesterday to mid day today.  Aware that ERCP is set for tmrw.    OBJECTIVE:         Vital signs in last 24 hours:    Temp:  [97.9 F (36.6 C)-98.7 F (37.1 C)] 97.9 F (36.6 C) (09/06 1520) Pulse Rate:  [68-94] 87 (09/06 1300) Resp:  [19-30] 26 (09/06 1300) BP: (97-148)/(48-78) 125/66 (09/06 1200) SpO2:  [89 %-95 %] 91 % (09/06 1300) Last BM Date : 03/02/22 Filed Weights   03/02/22 1545  Weight: 81.1 kg   General: elderly, frail but spunky and alert   Heart: RRR Chest: active cough.  No dyspnea Abdomen: soft, NT, ND.  BS active  Neuro/Psych:  oriented x 3.  Talkative, fluid speech.  No gross deficits.    Intake/Output from previous day: 09/05 0701 - 09/06 0700 In: 628.7 [P.O.:360; I.V.:118.9; IV Piggyback:149.8] Out: 2925 [Urine:2925]  Intake/Output this shift: No intake/output data recorded.  Lab Results: Recent Labs    03/03/22 0005 03/04/22 0527  WBC 10.5 6.3  HGB 13.0 12.5*  HCT 39.6 37.6*  PLT 156 154   BMET Recent Labs    03/02/22 1634 03/03/22 0005 03/04/22 0527  NA 141 138 138  K 5.0 4.0 4.2  CL 109 107 106  CO2 '23 23 26  '$ GLUCOSE 137* 99 111*  BUN '12 12 9  '$ CREATININE 1.20 0.98 0.98  CALCIUM 8.3* 8.3* 8.5*   LFT Recent Labs    03/02/22 1634 03/03/22 0555 03/04/22 0527  PROT 5.2* 5.4* 5.0*  ALBUMIN 2.8* 2.6* 2.4*  AST 291* 152* 72*  ALT 329* 264* 184*  ALKPHOS 97 88 103  BILITOT 4.1* 4.6* 2.9*  BILIDIR  --  3.0*  --   IBILI  --  1.6*  --    PT/INR No results for input(s): "LABPROT", "INR" in the last 72 hours. Hepatitis Panel No results for input(s): "HEPBSAG", "HCVAB", "HEPAIGM", "HEPBIGM" in the last 72 hours.  Studies/Results: CT ABDOMEN PELVIS W WO  CONTRAST  Result Date: 03/03/2022 CLINICAL DATA:  Acute nonlocalized abdominal pain. EXAM: CT ABDOMEN AND PELVIS WITHOUT AND WITH CONTRAST TECHNIQUE: Multidetector CT imaging of the abdomen and pelvis was performed following the standard protocol before and following the bolus administration of intravenous contrast. RADIATION DOSE REDUCTION: This exam was performed according to the departmental dose-optimization program which includes automated exposure control, adjustment of the mA and/or kV according to patient size and/or use of iterative reconstruction technique. CONTRAST:  143m OMNIPAQUE IOHEXOL 300 MG/ML  SOLN COMPARISON:  None Available. FINDINGS: Lower chest: Centrilobular emphsyema noted.Calcified and noncalcified micro nodularity noted right lower lobe with areas of subsegmental atelectasis at the right base. Hepatobiliary: Tiny hypodensities in the right liver are too small to characterize but likely benign. Gallstones measure up to 12 mm diameter. Despite the lack of biliary dilatation, an 8 mm stone is identified in the common bile duct (axial 24/6 and coronal 44/12). Pancreas: No focal mass lesion. No dilatation of the main duct. No intraparenchymal cyst. No peripancreatic edema. Spleen: Calcified granulomata. Adrenals/Urinary Tract: No adrenal nodule or mass. 3 nonobstructing stones are seen in the right kidney measuring up to 10 mm in the lower pole region. Bilateral renal cysts evident. No evidence  for hydroureter. The urinary bladder appears normal for the degree of distention. Stomach/Bowel: Stomach is unremarkable. No gastric wall thickening. No evidence of outlet obstruction. Duodenum is normally positioned as is the ligament of Treitz. Duodenal diverticulum noted. No small bowel wall thickening. No small bowel dilatation. Status post right hemicolectomy. Diverticular changes are noted in the left colon without evidence of diverticulitis. Vascular/Lymphatic: There is moderate atherosclerotic  calcification of the abdominal aorta without aneurysm. There is no gastrohepatic or hepatoduodenal ligament lymphadenopathy. No retroperitoneal or mesenteric lymphadenopathy. No pelvic sidewall lymphadenopathy. Reproductive: The prostate gland and seminal vesicles are unremarkable. Other: No intraperitoneal free fluid. 4.4 x 1.0 cm focus of abnormal soft tissue is identified in the midline anterior abdomen, involving the omentum/anterior peritoneum (31/3). Musculoskeletal: No worrisome lytic or sclerotic osseous abnormality. Lumbar fusion hardware evident. 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 Midline wide-mouth hernia contains small bowel without complicating features (65/7). IMPRESSION: 1. Cholelithiasis with choledocholithiasis. 8 mm stone in the common bile duct without biliary dilatation. 2. 4.4 x 1.0 cm focus of soft tissue density in the omentum/peritoneum of the anterior midline abdominal wall. This may be granulation tissue/scarring related to prior surgery, but follow-up recommended to ensure stability, especially if the patient has a cancer history. Consider follow-up CT in 3 months. Alternatively the patient has prior CT imaging of the abdomen, comparison to those older studies to assess chronicity would likely prove helpful. 3. Nonobstructing right nephrolithiasis. 4. Left colonic diverticulosis without diverticulitis. 5. Calcified and noncalcified micro nodularity right lower lobe, likely sequelae of infection/inflammation or potential prior aspiration. 6. Aortic Atherosclerosis (ICD10-I70.0) and Emphysema (ICD10-J43.9). Electronically Signed   By: Misty Stanley M.D.   On: 03/03/2022 08:20   ECHOCARDIOGRAM COMPLETE  Result Date: 03/02/2022    ECHOCARDIOGRAM REPORT   Patient Name:   Herbert May Date of Exam: 03/02/2022 Medical Rec #:  [id number]         Height:       67.0 in Accession #:    [id number]        Weight:        178.8 lb Date of Birth:  09-19-1940          BSA:          1.928 m Patient Age:    81 years          BP:           128/64 mmHg Patient Gender: M                 HR:           78 bpm. Exam Location:  Inpatient Procedure: 2D Echo, Cardiac Doppler, Color Doppler and Intracardiac            Opacification Agent Indications:    Pre-operative evaluation  History:        Patient has prior history of Echocardiogram examinations, most                 recent 09/04/2015. CAD, COPD, Aortic Valve Disease; Risk                 Factors:Hypertension, Dyslipidemia, Former Smoker and Diabetes.                 Shock.  Sonographer:    Clayton Lefort RDCS (AE) Referring Phys: [id number] Candee Furbish  Sonographer Comments: Suboptimal parasternal window and Technically difficult study due to poor echo  windows. Image acquisition challenging due to COPD. IMPRESSIONS  1. Left ventricular ejection fraction, by estimation, is 60 to 65%. The left ventricle has normal function. The left ventricle has no regional wall motion abnormalities. There is moderate left ventricular hypertrophy. Left ventricular diastolic parameters are consistent with Grade I diastolic dysfunction (impaired relaxation).  2. Right ventricular systolic function is normal. The right ventricular size is normal. Tricuspid regurgitation signal is inadequate for assessing PA pressure.  3. The mitral valve is abnormal. Trivial mitral valve regurgitation. Moderate mitral annular calcification.  4. The aortic valve is tricuspid. There is mild calcification of the aortic valve. Aortic valve regurgitation is not visualized. Mild to moderate aortic valve stenosis. Aortic valve area, by VTI measures 1.47 cm. Aortic valve mean gradient measures 8.0  mmHg. Aortic valve Vmax measures 2.06 m/s.  5. The inferior vena cava is dilated in size with >50% respiratory variability, suggesting right atrial pressure of 8 mmHg. Comparison(s): Changes from prior study are noted. 09/04/2015: LVEF 55-60%,  aortic sclerosis without stenosis. FINDINGS  Left Ventricle: Left ventricular ejection fraction, by estimation, is 60 to 65%. The left ventricle has normal function. The left ventricle has no regional wall motion abnormalities. Definity contrast agent was given IV to delineate the left ventricular  endocardial borders. The left ventricular internal cavity size was normal in size. There is moderate left ventricular hypertrophy. Left ventricular diastolic parameters are consistent with Grade I diastolic dysfunction (impaired relaxation). Indeterminate filling pressures. Right Ventricle: The right ventricular size is normal. No increase in right ventricular wall thickness. Right ventricular systolic function is normal. Tricuspid regurgitation signal is inadequate for assessing PA pressure. Left Atrium: Left atrial size was normal in size. Right Atrium: Right atrial size was normal in size. Pericardium: There is no evidence of pericardial effusion. Mitral Valve: The mitral valve is abnormal. Moderate mitral annular calcification. Trivial mitral valve regurgitation. Tricuspid Valve: The tricuspid valve is normal in structure. Tricuspid valve regurgitation is not demonstrated. Aortic Valve: The aortic valve is tricuspid. There is mild calcification of the aortic valve. Aortic valve regurgitation is not visualized. Mild to moderate aortic stenosis is present. Aortic valve mean gradient measures 8.0 mmHg. Aortic valve peak gradient measures 17.0 mmHg. Aortic valve area, by VTI measures 1.47 cm. Pulmonic Valve: The pulmonic valve was not well visualized. Pulmonic valve regurgitation is not visualized. Aorta: The aortic root and ascending aorta are structurally normal, with no evidence of dilitation. Venous: The inferior vena cava is dilated in size with greater than 50% respiratory variability, suggesting right atrial pressure of 8 mmHg. IAS/Shunts: No atrial level shunt detected by color flow Doppler.  LEFT VENTRICLE PLAX  2D LVIDd:         3.80 cm   Diastology LVIDs:         2.70 cm   LV e' medial:    6.64 cm/s LV PW:         1.60 cm   LV E/e' medial:  13.0 LV IVS:        1.40 cm   LV e' lateral:   10.40 cm/s LVOT diam:     2.20 cm   LV E/e' lateral: 8.3 LV SV:         59 LV SV Index:   31 LVOT Area:     3.80 cm  RIGHT VENTRICLE             IVC RV Basal diam:  3.30 cm     IVC diam: 2.50 cm RV  S prime:     13.10 cm/s TAPSE (M-mode): 1.5 cm LEFT ATRIUM             Index        RIGHT ATRIUM           Index LA diam:        3.20 cm 1.66 cm/m   RA Area:     20.20 cm LA Vol (A2C):   54.8 ml 28.42 ml/m  RA Volume:   64.40 ml  33.40 ml/m LA Vol (A4C):   37.9 ml 19.66 ml/m LA Biplane Vol: 49.2 ml 25.52 ml/m  AORTIC VALVE AV Area (Vmax):    1.35 cm AV Area (Vmean):   1.44 cm AV Area (VTI):     1.47 cm AV Vmax:           206.00 cm/s AV Vmean:          135.000 cm/s AV VTI:            0.402 m AV Peak Grad:      17.0 mmHg AV Mean Grad:      8.0 mmHg LVOT Vmax:         73.10 cm/s LVOT Vmean:        51.300 cm/s LVOT VTI:          0.155 m LVOT/AV VTI ratio: 0.39  AORTA Ao Root diam: 3.60 cm Ao Asc diam:  3.50 cm MITRAL VALVE MV Area (PHT): 3.17 cm    SHUNTS MV Decel Time: 239 msec    Systemic VTI:  0.16 m MV E velocity: 86.50 cm/s  Systemic Diam: 2.20 cm MV A velocity: 81.80 cm/s MV E/A ratio:  1.06 Lyman Bishop MD Electronically signed by Lyman Bishop MD Signature Date/Time: 03/02/2022/6:21:49 PM    Final     Scheduled Meds:  Chlorhexidine Gluconate Cloth  6 each Topical Daily   cyanocobalamin  1,000 mcg Oral Daily   enoxaparin (LOVENOX) injection  40 mg Subcutaneous Q24H   fluticasone  2 spray Each Nare Daily   fluticasone furoate-vilanterol  1 puff Inhalation Daily   And   umeclidinium bromide  1 puff Inhalation Daily   gabapentin  300 mg Oral TID   insulin aspart  0-15 Units Subcutaneous TID WC   lidocaine  1 patch Transdermal Q24H   methocarbamol  500 mg Oral QHS   pantoprazole  40 mg Oral BID   Continuous Infusions:   sodium chloride 10 mL/hr at 03/04/22 0600   piperacillin-tazobactam (ZOSYN)  IV 3.375 g (03/04/22 1015)   PRN Meds:.sodium chloride, albuterol, docusate sodium, morphine injection, ondansetron (ZOFRAN) IV, mouth rinse, oxyCODONE, polyethylene glycol, traZODone  ASSESMENT:   Choledocholithiasis.  Acute cholangitis.  Cholecystitis.  Ongoing Zosyn. LFTs all downtrending.  Leukocytosis resolved.  No fevers.  Chronic Plavix, on hold, last dose 9/3.  Indication CAD.   PLAN     ERCP set for 1PM tmrw.  Post ERCP NSAID PR not ordered as contraindication to ASA causing Hives, itching.       Azucena Freed  03/04/2022, 3:23 PM Phone [phone]    Attending physician's note   I have taken a history, reviewed the chart and examined the patient. I performed a substantive portion of this encounter, including complete performance of at least one of the key components, in conjunction with the APP. I agree with the APP's note, impression and recommendations.    Choledocholithiasis with acute cholangitis.  Hemodynamically stable On IV Zosyn Last dose of Plavix  on September 3 Plan for ERCP tomorrow by Dr. Fuller Plan   The patient was provided an opportunity to ask questions and all were answered. The patient agreed with the plan and demonstrated an understanding of the instructions.   Damaris Hippo , MD [phone]

## 2022-03-05 ENCOUNTER — Inpatient Hospital Stay (HOSPITAL_COMMUNITY): Payer: Medicare Other

## 2022-03-05 ENCOUNTER — Inpatient Hospital Stay (HOSPITAL_COMMUNITY): Payer: Medicare Other | Admitting: Certified Registered Nurse Anesthetist

## 2022-03-05 ENCOUNTER — Encounter (HOSPITAL_COMMUNITY)
Disposition: A | Discharge status: Home / Self Care | Payer: Self-pay | Source: Other Acute Inpatient Hospital | Attending: Internal Medicine

## 2022-03-05 ENCOUNTER — Encounter (HOSPITAL_COMMUNITY): Payer: Self-pay | Admitting: Internal Medicine

## 2022-03-05 DIAGNOSIS — E119 Type 2 diabetes mellitus without complications: Secondary | ICD-10-CM

## 2022-03-05 DIAGNOSIS — Z7984 Long term (current) use of oral hypoglycemic drugs: Secondary | ICD-10-CM | POA: Diagnosis not present

## 2022-03-05 DIAGNOSIS — J449 Chronic obstructive pulmonary disease, unspecified: Secondary | ICD-10-CM

## 2022-03-05 DIAGNOSIS — K8051 Calculus of bile duct without cholangitis or cholecystitis with obstruction: Secondary | ICD-10-CM

## 2022-03-05 DIAGNOSIS — Z87891 Personal history of nicotine dependence: Secondary | ICD-10-CM

## 2022-03-05 DIAGNOSIS — R579 Shock, unspecified: Secondary | ICD-10-CM | POA: Diagnosis not present

## 2022-03-05 DIAGNOSIS — K8071 Calculus of gallbladder and bile duct without cholecystitis with obstruction: Secondary | ICD-10-CM

## 2022-03-05 DIAGNOSIS — R7989 Other specified abnormal findings of blood chemistry: Secondary | ICD-10-CM

## 2022-03-05 HISTORY — PX: SPHINCTEROTOMY: SHX5544

## 2022-03-05 HISTORY — PX: REMOVAL OF STONES: SHX5545

## 2022-03-05 HISTORY — PX: ENDOSCOPIC RETROGRADE CHOLANGIOPANCREATOGRAPHY (ERCP) WITH PROPOFOL: SHX5810

## 2022-03-05 LAB — COMPREHENSIVE METABOLIC PANEL
ALT: 126 U/L — ABNORMAL HIGH (ref 0–44)
AST: 41 U/L (ref 15–41)
Albumin: 2.5 g/dL — ABNORMAL LOW (ref 3.5–5.0)
Alkaline Phosphatase: 127 U/L — ABNORMAL HIGH (ref 38–126)
Anion gap: 7 (ref 5–15)
BUN: 8 mg/dL (ref 8–23)
CO2: 25 mmol/L (ref 22–32)
Calcium: 8.3 mg/dL — ABNORMAL LOW (ref 8.9–10.3)
Chloride: 108 mmol/L (ref 98–111)
Creatinine, Ser: 1 mg/dL (ref 0.61–1.24)
GFR, Estimated: >60 mL/min (ref >60)
Glucose, Bld: 114 mg/dL — ABNORMAL HIGH (ref 70–99)
Potassium: 3.8 mmol/L (ref 3.5–5.1)
Sodium: 140 mmol/L (ref 135–145)
Total Bilirubin: 2.1 mg/dL — ABNORMAL HIGH (ref 0.3–1.2)
Total Protein: 5.5 g/dL — ABNORMAL LOW (ref 6.5–8.1)

## 2022-03-05 LAB — CBC WITH DIFFERENTIAL/PLATELET
Abs Immature Granulocytes: 0.02 10*3/uL (ref 0.00–0.07)
Basophils Absolute: 0 10*3/uL (ref 0.0–0.1)
Basophils Relative: 1 %
Eosinophils Absolute: 0.1 10*3/uL (ref 0.0–0.5)
Eosinophils Relative: 2 %
HCT: 37.9 % — ABNORMAL LOW (ref 39.0–52.0)
Hemoglobin: 12.9 g/dL — ABNORMAL LOW (ref 13.0–17.0)
Immature Granulocytes: 1 %
Lymphocytes Relative: 23 %
Lymphs Abs: 1 10*3/uL (ref 0.7–4.0)
MCH: 30.6 pg (ref 26.0–34.0)
MCHC: 34 g/dL (ref 30.0–36.0)
MCV: 89.8 fL (ref 80.0–100.0)
Monocytes Absolute: 0.5 10*3/uL (ref 0.1–1.0)
Monocytes Relative: 11 %
Neutro Abs: 2.8 10*3/uL (ref 1.7–7.7)
Neutrophils Relative %: 62 %
Platelets: 193 10*3/uL (ref 150–400)
RBC: 4.22 MIL/uL (ref 4.22–5.81)
RDW: 14 % (ref 11.5–15.5)
WBC: 4.4 10*3/uL (ref 4.0–10.5)
nRBC: 0 % (ref 0.0–0.2)

## 2022-03-05 LAB — GLUCOSE, CAPILLARY
Glucose-Capillary: 116 mg/dL — ABNORMAL HIGH (ref 70–99)
Glucose-Capillary: 109 mg/dL — ABNORMAL HIGH (ref 70–99)
Glucose-Capillary: 164 mg/dL — ABNORMAL HIGH (ref 70–99)
Glucose-Capillary: 262 mg/dL — ABNORMAL HIGH (ref 70–99)
Glucose-Capillary: 182 mg/dL — ABNORMAL HIGH (ref 70–99)

## 2022-03-05 LAB — MAGNESIUM: Magnesium: 1.6 mg/dL — ABNORMAL LOW (ref 1.7–2.4)

## 2022-03-05 SURGERY — ENDOSCOPIC RETROGRADE CHOLANGIOPANCREATOGRAPHY (ERCP) WITH PROPOFOL
Anesthesia: General

## 2022-03-05 MED ORDER — GLUCAGON HCL RDNA (DIAGNOSTIC) 1 MG IJ SOLR
INTRAMUSCULAR | Status: DC | PRN
  Administered 2022-03-05: .25 mg via INTRAVENOUS

## 2022-03-05 MED ORDER — LACTATED RINGERS IV SOLN: INTRAVENOUS | Status: DC | PRN

## 2022-03-05 MED ORDER — MAGNESIUM SULFATE 2 GM/50ML IV SOLN
2.0000 g | Freq: Once | INTRAVENOUS | Status: AC
  Administered 2022-03-05: 2 g via INTRAVENOUS
  Filled 2022-03-05: qty 50

## 2022-03-05 MED ORDER — ROCURONIUM BROMIDE 10 MG/ML (PF) SYRINGE
PREFILLED_SYRINGE | INTRAVENOUS | Status: DC | PRN
  Administered 2022-03-05: 70 mg via INTRAVENOUS

## 2022-03-05 MED ORDER — FENTANYL CITRATE (PF) 100 MCG/2ML IJ SOLN: 25.0000 ug | INTRAMUSCULAR | Status: DC | PRN

## 2022-03-05 MED ORDER — PHENYLEPHRINE 80 MCG/ML (10ML) SYRINGE FOR IV PUSH (FOR BLOOD PRESSURE SUPPORT)
PREFILLED_SYRINGE | INTRAVENOUS | Status: DC | PRN
  Administered 2022-03-05: 160 ug via INTRAVENOUS
  Administered 2022-03-05: 80 ug via INTRAVENOUS
  Administered 2022-03-05 (×2): 160 ug via INTRAVENOUS

## 2022-03-05 MED ORDER — GLUCAGON HCL RDNA (DIAGNOSTIC) 1 MG IJ SOLR
INTRAMUSCULAR | Status: AC
  Filled 2022-03-05: qty 1

## 2022-03-05 MED ORDER — PROPOFOL 10 MG/ML IV BOLUS
INTRAVENOUS | Status: DC | PRN
  Administered 2022-03-05: 130 mg via INTRAVENOUS

## 2022-03-05 MED ORDER — ONDANSETRON HCL 4 MG/2ML IJ SOLN: 4.0000 mg | Freq: Once | INTRAMUSCULAR | Status: DC | PRN

## 2022-03-05 MED ORDER — FENTANYL CITRATE (PF) 250 MCG/5ML IJ SOLN
INTRAMUSCULAR | Status: DC | PRN
  Administered 2022-03-05: 50 ug via INTRAVENOUS

## 2022-03-05 MED ORDER — LIDOCAINE 2% (20 MG/ML) 5 ML SYRINGE
INTRAMUSCULAR | Status: DC | PRN
  Administered 2022-03-05: 100 mg via INTRAVENOUS

## 2022-03-05 MED ORDER — SODIUM CHLORIDE 0.9 % IV SOLN
INTRAVENOUS | Status: DC | PRN
  Administered 2022-03-05: 25 mL

## 2022-03-05 MED ORDER — ONDANSETRON HCL 4 MG/2ML IJ SOLN
INTRAMUSCULAR | Status: DC | PRN
  Administered 2022-03-05: 4 mg via INTRAVENOUS

## 2022-03-05 MED ORDER — PHENYLEPHRINE HCL-NACL 20-0.9 MG/250ML-% IV SOLN
INTRAVENOUS | Status: DC | PRN
  Administered 2022-03-05: 40 ug/min via INTRAVENOUS

## 2022-03-05 MED ORDER — DICLOFENAC SUPPOSITORY 100 MG
RECTAL | Status: AC
  Filled 2022-03-05: qty 1

## 2022-03-05 MED ORDER — SUGAMMADEX SODIUM 200 MG/2ML IV SOLN
INTRAVENOUS | Status: DC | PRN
  Administered 2022-03-05 (×4): 100 mg via INTRAVENOUS

## 2022-03-05 MED ORDER — DEXAMETHASONE SODIUM PHOSPHATE 10 MG/ML IJ SOLN
INTRAMUSCULAR | Status: DC | PRN
  Administered 2022-03-05: 4 mg via INTRAVENOUS

## 2022-03-05 MED ORDER — FENTANYL CITRATE (PF) 100 MCG/2ML IJ SOLN
INTRAMUSCULAR | Status: AC
  Filled 2022-03-05: qty 2

## 2022-03-05 MED ORDER — DICLOFENAC SODIUM 1 % EX GEL
4.0000 g | Freq: Four times a day (QID) | CUTANEOUS | Status: DC
  Administered 2022-03-05 – 2022-03-07 (×5): 4 g via TOPICAL
  Filled 2022-03-05: qty 100

## 2022-03-05 MED ORDER — HYDROMORPHONE HCL 1 MG/ML IJ SOLN: 0.5000 mg | INTRAMUSCULAR | Status: DC | PRN

## 2022-03-05 NOTE — Care Management Important Message (Signed)
Important Message  Patient Details  Name: Herbert May MRN: [id number] Date of Birth: June 25, 1941   Medicare Important Message Given:  Yes     Herbert May 03/05/2022, 4:06 PM

## 2022-03-05 NOTE — Progress Notes (Addendum)
Progress Note Patient: Herbert May ZOX:[id number] DOB: Jun 24, 1941 DOA: 03/02/2022  DOS: the patient was seen and examined on 03/05/2022  Brief hospital course: 81 year old male with PMH of CAD SP PCI, type II DM, HTN, HLD, GERD, COPD, chronic hypoxic respiratory failure on 3 to 4 L of oxygen, former smoker.  Presented to the hospital with complaints of back pain progressively worsening to involve the front of the abdomen.  Found to have acute liver injury with ultrasound showing evidence of gallbladder thickening and stone and normal CBD.  Patient was in septic shock hock at [facility], was originally admitted at ICU at [facility].  GI cardiology and general surgery following.  PCCM signed off. Assessment and Plan: Septic shock present on admission.  Secondary to acute cholangitis and cholecystitis Presents at [facility] with back pain worsening abdominal pain.  Was hypotensive at arrival and was started on Levophed after hydration. Cause of septic shock is is a new cholangitis and cholecystitis. Started on IV Zosyn. We will continue the medicine for now. GI following. Underwent ERCP.  5 stones removed.  Advance the diet to full liquid diet..  Surgery will consider open cholecystectomy. Currently no fever.  LFT trending down.  Choledocholithiasis Cholelithiasis CBD stone. Elevated LFT and bilirubin. General surgery following. CT scan shows 8 mm stone in the CBD. Scheduled for ERCP. Plavix on hold since September 3. General surgery recommends open cholecystectomy given his history of incisional hernia. For now continue with IV antibiotics. Avoid hepatotoxic medications.  CAD SP PCI Moderate aortic stenosis Diastolic dysfunction Cardiology consulted. Appreciate assistance.  Preoperatively no further work-up necessary. Plavix on hold since September 3.  COPD Does not appear to have any exacerbation right now. Continue inhalers.  Chronic pain on opioids. Patient is on fentanyl  patch 100 mcg at home along with Percocet. We will continue the same medications.  Type 2 diabetes mellitus, controlled without any long-term insulin use with HLD Continue sliding scale insulin. Reason hemoglobin A1c 6.1.  HLD. Holding statin right now.  GERD. Continue PPI. Dose increased to twice daily for now. Management per GI pending ERCP.  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 Management per surgery.  4.4 x 1 cm soft tissue density in omentum Repeat CT scan in 3 months.  Hypomagnesemia. We will replace.   Subjective: Improving abdominal pain.  No fever no chills.  No diarrhea.  Physical Exam: Vitals:   03/05/22 1445 03/05/22 1455 03/05/22 1523 03/05/22 1742  BP: 136/65 134/65 (!) 144/69 135/62  Pulse: 72 81 80 79  Resp: 18 (!) '21 18 18  '$ Temp:   97.7 F (36.5 C) 98.2 F (36.8 C)  TempSrc:      SpO2: 96% 95% 91% 94%  Weight:      Height:       General: Appear in mild distress; no visible Abnormal Neck Mass Or lumps, Conjunctiva normal Cardiovascular: S1 and S2 Present, aortic systolic  Murmur, Respiratory: good respiratory effort, Bilateral Air entry present and CTA, no Crackles, no wheezes Abdomen: Bowel Sound present, epigastric tenderness present.  Unchanged from yesterday. Extremities: no Pedal edema Neurology: alert and oriented to Self, Place and time.   Data Reviewed: I have Reviewed nursing notes, Vitals, and Lab results since pt's last encounter. Pertinent lab results CBC and BMP I have ordered test including CBC and BMP    Family Communication: No one at bedside  Disposition: Status is: Inpatient Remains inpatient appropriate because: Needing open cholecystectomy.  Author: Berle Mull, MD 03/05/2022 7:10 PM  Please look on [email or web address] to find out who is on call.

## 2022-03-05 NOTE — Plan of Care (Signed)
  Problem: Education: Goal: Knowledge of General Education information will improve Description: Including pain rating scale, medication(s)/side effects and non-pharmacologic comfort measures Outcome: Completed/Met

## 2022-03-05 NOTE — Interval H&P Note (Signed)
History and Physical Interval Note:  03/05/2022 1:02 PM  Herbert May  has presented today for surgery, with the diagnosis of Choledocholithiasis.  The various methods of treatment have been discussed with the patient and family. After consideration of risks, benefits and other options for treatment, the patient has consented to  Procedure(s): ENDOSCOPIC RETROGRADE CHOLANGIOPANCREATOGRAPHY (ERCP) WITH PROPOFOL (N/A) as a surgical intervention.  The patient's history has been reviewed, patient examined, no change in status, stable for surgery.  I have reviewed the patient's chart and labs.  Questions were answered to the patient's satisfaction.     Pricilla Riffle. Fuller Plan

## 2022-03-05 NOTE — Anesthesia Procedure Notes (Signed)
Procedure Name: Intubation Date/Time: 03/05/2022 1:22 PM  Performed by: Janene Harvey, CRNAPre-anesthesia Checklist: Patient identified, Emergency Drugs available, Suction available and Patient being monitored Patient Re-evaluated:Patient Re-evaluated prior to induction Oxygen Delivery Method: Circle system utilized Preoxygenation: Pre-oxygenation with 100% oxygen Induction Type: IV induction Ventilation: Mask ventilation without difficulty and Oral airway inserted - appropriate to patient size Laryngoscope Size: Mac and 4 Grade View: Grade I Tube type: Oral Tube size: 7.5 mm Number of attempts: 1 Airway Equipment and Method: Stylet and Oral airway Placement Confirmation: ETT inserted through vocal cords under direct vision, positive ETCO2 and breath sounds checked- equal and bilateral Secured at: 22 cm Tube secured with: Tape Dental Injury: Teeth and Oropharynx as per pre-operative assessment

## 2022-03-05 NOTE — Progress Notes (Addendum)
Subjective: Hungry but overall in good spirits. No abdominal pain at rest and mild with palpation in RUQ. No nausea or emesis.  Objective: Vital signs in last 24 hours: Temp:  [97.9 F (36.6 C)-98.8 F (37.1 C)] 98.7 F (37.1 C) (09/07 1035) Pulse Rate:  [77-105] 90 (09/07 1035) Resp:  [16-30] 18 (09/07 1035) BP: (94-138)/(60-104) 138/79 (09/07 1035) SpO2:  [91 %-94 %] 93 % (09/07 1035) Weight:  [84.6 kg] 84.6 kg (09/06 2139) Last BM Date : 03/02/22  Intake/Output from previous day: 09/06 0701 - 09/07 0700 In: 626.7 [P.O.:390; I.V.:54.9; IV Piggyback:181.8] Out: 2700 [Urine:2700] Intake/Output this shift: Total I/O In: -  Out: 600 [Urine:600]  PE: Gen:  Alert, NAD, pleasant Abd:  Soft, mild distension, diffuse upper abdominal ttp greatest in the epigastrium, +BS. Noted midline incisional hernia that feels wide mouth and appears and is reducible but recurs given size. R anterior abdominal wall hernia over old ostomy site that is reducible but appears to recur again with increased intraabdominal pressure  Lab Results:  Recent Labs    03/03/22 0005 03/04/22 0527  WBC 10.5 6.3  HGB 13.0 12.5*  HCT 39.6 37.6*  PLT 156 154    BMET Recent Labs    03/03/22 0005 03/04/22 0527  NA 138 138  K 4.0 4.2  CL 107 106  CO2 23 26  GLUCOSE 99 111*  BUN 12 9  CREATININE 0.98 0.98  CALCIUM 8.3* 8.5*    PT/INR No results for input(s): "LABPROT", "INR" in the last 72 hours. CMP     Component Value Date/Time   NA 138 03/04/2022 0527   NA 138 02/08/2014 1236   K 4.2 03/04/2022 0527   CL 106 03/04/2022 0527   CO2 26 03/04/2022 0527   GLUCOSE 111 (H) 03/04/2022 0527   BUN 9 03/04/2022 0527   BUN 9 02/08/2014 1236   CREATININE 0.98 03/04/2022 0527   CREATININE 1.09 12/12/2012 1113   CALCIUM 8.5 (L) 03/04/2022 0527   PROT 5.0 (L) 03/04/2022 0527   PROT 6.5 02/08/2014 1236   ALBUMIN 2.4 (L) 03/04/2022 0527   ALBUMIN 4.5 02/08/2014 1236   AST 72 (H) 03/04/2022  0527   ALT 184 (H) 03/04/2022 0527   ALKPHOS 103 03/04/2022 0527   BILITOT 2.9 (H) 03/04/2022 0527   GFRNONAA >60 03/04/2022 0527   GFRNONAA 68 12/12/2012 1113   GFRAA >60 08/23/2015 1943   GFRAA 79 12/12/2012 1113   Lipase     Component Value Date/Time   LIPASE 18 03/02/2022 1634    Studies/Results: No results found.  Anti-infectives: Anti-infectives (From admission, onward)    Start     Dose/Rate Route Frequency Ordered Stop   03/02/22 1745  piperacillin-tazobactam (ZOSYN) IVPB 3.375 g        3.375 g 12.5 mL/hr over 240 Minutes Intravenous Every 8 hours 03/02/22 1651     03/02/22 1700  piperacillin-tazobactam (ZOSYN) IVPB 3.375 g  Status:  Discontinued        3.375 g 100 mL/hr over 30 Minutes Intravenous  Once 03/02/22 1604 03/02/22 1651        Assessment/Plan Choledocholithiasis, possible Cholangitis Cholecystitis - Workup revealing Acute Cholecystitis changes on [place] and Choledocholithiasis on CT w/ elevated LFT's on lab work. AT [facility] noted to be in septic shock requiring Levo so was transferred to [facility]. Now off Levo, tachycardia resolved - WBC had normalized. Labs pending this am - LFT's downtrending as of yesterday. GI is planning ERCP today -  Cardiology has evaluated and cleared for operative intervention from cardiac standpoint - they do note some concern for possible underlying pulmonary disease. He was followed by PCCM earlier in admission. He will need pulmonary status optimized prior to surgery. Cholecystectomy would likely be open given prior surgeries, to occur after ERCP. If he was to acutely worsen, would recommend Perc Chole.    FEN -  NPO for ERCP. 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 Has seen Gen Surgery, Dr. Tally Due at [facility] for this. Denies obstructive symptoms. The R abdominal wall  hernia appears to be Richter's type based on scan. Exam as noted above. Will monitor but suspect can f/u as outpatient for this   - Per CCM -  Hx of CAD s/p stents x 3 on Plavix (last dose 03/01/22 per chart) COPD on 3-5L o2 at night DM2 HTN HLD GERD Incidental finding - per Radiology there is a 4.4 x 1.0 cm focus of soft tissue density in the omentum/peritoneum of the anterior midline abdominal wall. They recommended follow-up CT in 3 months.   LOS: 3 days    [facility] 03/05/2022, 11:41 AM Please see Amion for pager number during day hours 7:00am-4:30pm

## 2022-03-05 NOTE — Plan of Care (Signed)
  Problem: Clinical Measurements: Goal: Respiratory complications will improve Outcome: Progressing Goal: Cardiovascular complication will be avoided Outcome: Progressing   Problem: Elimination: Goal: Will not experience complications related to bowel motility Outcome: Progressing Goal: Will not experience complications related to urinary retention Outcome: Progressing

## 2022-03-05 NOTE — Anesthesia Postprocedure Evaluation (Signed)
Anesthesia Post Note  Patient: Herbert May  Procedure(s) Performed: ENDOSCOPIC RETROGRADE CHOLANGIOPANCREATOGRAPHY (ERCP) WITH PROPOFOL SPHINCTEROTOMY REMOVAL OF STONES     Patient location during evaluation: PACU Anesthesia Type: General Level of consciousness: awake and alert, oriented and patient cooperative Pain management: pain level controlled Vital Signs Assessment: post-procedure vital signs reviewed and stable Respiratory status: spontaneous breathing, nonlabored ventilation and respiratory function stable Cardiovascular status: blood pressure returned to baseline and stable Postop Assessment: no apparent nausea or vomiting Anesthetic complications: no   No notable events documented.  Last Vitals:  Vitals:   03/05/22 1445 03/05/22 1455  BP: 136/65 134/65  Pulse: 72 81  Resp: 18 (!) 21  Temp:    SpO2: 96% 95%    Last Pain:  Vitals:   03/05/22 1455  TempSrc:   PainSc: 0-No pain                 Pervis Hocking

## 2022-03-05 NOTE — Transfer of Care (Signed)
Immediate Anesthesia Transfer of Care Note  Patient: Herbert May  Procedure(s) Performed: ENDOSCOPIC RETROGRADE CHOLANGIOPANCREATOGRAPHY (ERCP) WITH PROPOFOL SPHINCTEROTOMY REMOVAL OF STONES  Patient Location: Endoscopy Unit  Anesthesia Type:General  Level of Consciousness: drowsy and patient cooperative  Airway & Oxygen Therapy: Patient Spontanous Breathing and Patient connected to face mask oxygen  Post-op Assessment: Report given to RN and Post -op Vital signs reviewed and stable  Post vital signs: Reviewed and stable  Last Vitals:  Vitals Value Taken Time  BP 140/67 03/05/22 1425  Temp    Pulse 78 03/05/22 1428  Resp 22 03/05/22 1428  SpO2 97 % 03/05/22 1428  Vitals shown include unvalidated device data.  Last Pain:  Vitals:   03/05/22 1217  TempSrc: Oral  PainSc: 7       Patients Stated Pain Goal: 0 (00/45/99 7741)  Complications: No notable events documented.

## 2022-03-05 NOTE — Op Note (Signed)
[facility] Patient Name: Herbert May Procedure Date : 03/05/2022 MRN: [id number] Attending MD: Ladene Artist , MD Date of Birth: 03/11/1941 CSN: [id number] Age: 81 Admit Type: Outpatient Procedure:                ERCP Indications:              Bile duct stone(s), Suspected ascending                            cholangitis, Elevated liver enzymes Providers:                Pricilla Riffle. Fuller Plan, MD, Jeanella Cara, RN,                            Gloris Ham, Technician Referring MD:             CCM Medicines:                General Anesthesia Complications:            No immediate complications. Estimated Blood Loss:     Estimated blood loss: none. Procedure:                Pre-Anesthesia Assessment:                           - Prior to the procedure, a History and Physical                            was performed, and patient medications and                            allergies were reviewed. The patient's tolerance of                            previous anesthesia was also reviewed. The risks                            and benefits of the procedure and the sedation                            options and risks were discussed with the patient.                            All questions were answered, and informed consent                            was obtained. Prior Anticoagulants: The patient has                            taken Plavix (clopidogrel), last dose was 4 days                            prior to procedure. ASA Grade Assessment: III - A  patient with severe systemic disease. After                            reviewing the risks and benefits, the patient was                            deemed in satisfactory condition to undergo the                            procedure.                           After obtaining informed consent, the scope was                            passed under direct vision. Throughout the                             procedure, the patient's blood pressure, pulse, and                            oxygen saturations were monitored continuously. The                            [id number]-Q190V ([id number]) Olympus duodenoscope was                            introduced through the mouth, and used to inject                            contrast into and used to inject contrast into the                            bile duct. The ERCP was accomplished without                            difficulty. The patient tolerated the procedure                            well. Scope In: Scope Out: Findings:      The scout film was normal. The scope was advanced to a normal major       papilla in the descending duodenum. The was a moderate-sized       periampullary diverticulum immediately proximal to the major papilla.       Very limited examination of the pharynx, larynx and associated       structures, and upper GI tract was otherwise normal. The major papilla       was normal. After two brief wire cannulations of the PD with an 0.035       inch wire we changed to a Revolution sphinterotome and an 0.025 inch       straight wire was passed into the biliary tree. The short-nosed traction       sphincterotome was passed over the guidewire and the bile duct was then       deeply cannulated. Contrast  was injected. I personally interpreted the       bile duct images. There was appropriate flow of contrast through the       ducts. The common bile duct was diffusely dilated, with stones causing       an obstruction. The largest CBD diameter was 24m. The cystic duct and       gallbladder filled promptly. Cholelithiasis was noted. The intrahepatic       ducts appeared normal. The common bile duct contained five stones, the       largest of which was 8 mm in diameter. An 8 mm biliary sphincterotomy       was made with a traction (standard) sphincterotome using ERBE       electrocautery along the left side of the diverticulum.  Unable to safely       extend the sphincterotomy beyond 8 mm. There was no post-sphincterotomy       bleeding. The biliary tree was swept several times with a 9-12 mm       balloon starting at the bifurcation. All stones were removed. No stones       remained. The bile appeared normal. No pus was noted. The final       occlusion cholangigram was free of filling defects. Air refluxed into       the biliary tree. Excellent biliary drainage was noted. The PD was not       injected by intention. Impression:               - The major papilla appeared normal.                           - The common bile duct was dilated, with stones                            causing an obstruction.                           - Choledocholithiasis was found. Complete removal                            was accomplished by biliary sphincterotomy and                            balloon extraction.                           - Cholelithiasis.                           - Periampullary diverticulum. Recommendation:           - Return patient to hospital ward for ongoing care.                           - Observe patient's clinical course following                            today's ERCP with therapeutic intervention.                           -  Continue antibiotics.                           - Trend LFTs.                           - General surgery evaluating for possible                            cholecystectomy.                           - OK to resume Plavix (clopidogrel) at prior dose                            no sooner than 3 days. Refer to managing physician                            for further adjustment of therapy. Procedure Code(s):        --- Professional ---                           [phone], Endoscopic retrograde                            cholangiopancreatography (ERCP); with removal of                            calculi/debris from biliary/pancreatic duct(s)                           43262, Endoscopic  retrograde                            cholangiopancreatography (ERCP); with                            sphincterotomy/papillotomy Diagnosis Code(s):        --- Professional ---                           K80.51, Calculus of bile duct without cholangitis                            or cholecystitis with obstruction                           R74.8, Abnormal levels of other serum enzymes CPT copyright 2019 American Medical Association. All rights reserved. The codes documented in this report are preliminary and upon coder review may  be revised to meet current compliance requirements. Ladene Artist, MD 03/05/2022 2:28:05 PM This report has been signed electronically. Number of Addenda: 0

## 2022-03-05 NOTE — Anesthesia Preprocedure Evaluation (Signed)
Anesthesia Evaluation  Patient identified by MRN, date of birth, ID band Patient awake    Reviewed: Allergy & Precautions, NPO status , Patient's Chart, lab work & pertinent test results  History of Anesthesia Complications Negative for: history of anesthetic complications  Airway Mallampati: II  TM Distance: >3 FB Neck ROM: Full    Dental  (+) Upper Dentures, Dental Advisory Given   Pulmonary asthma , COPD,  oxygen dependent, former smoker,    Pulmonary exam normal        Cardiovascular hypertension, Pt. on medications and Pt. on home beta blockers + CAD and + Cardiac Stents  Normal cardiovascular exam+ Valvular Problems/Murmurs AS   Echo 03/02/22:  1. Left ventricular ejection fraction, by estimation, is 60 to 65%. The left ventricle has normal function. The left ventricle has no regional wall motion abnormalities. There is moderate left ventricular hypertrophy. Left ventricular diastolic  parameters are consistent with Grade I diastolic dysfunction (impaired relaxation). 2. Right ventricular systolic function is normal. The right ventricular size is normal. Tricuspid regurgitation signal is inadequate for assessing PA pressure. 3. The mitral valve is abnormal. Trivial mitral valve regurgitation. Moderate mitral annular calcification. 4. The aortic valve is tricuspid. There is mild calcification of the aortic valve. Aortic valve regurgitation is not visualized. Mild to moderate aortic valve stenosis. Aortic valve area, by VTI measures 1.47 cm. Aortic valve mean gradient measures 8.0 mmHg. Aortic valve Vmax measures 2.06 m/s. 5. The inferior vena cava is dilated in size with >50% respiratory variability, suggesting right atrial pressure of 8 mmHg.   Neuro/Psych negative neurological ROS     GI/Hepatic negative GI ROS, Neg liver ROS,   Endo/Other  diabetes, Oral Hypoglycemic Agents  Renal/GU negative Renal ROS  negative  genitourinary   Musculoskeletal negative musculoskeletal ROS (+)   Abdominal   Peds  Hematology negative hematology ROS (+)   Anesthesia Other Findings   Reproductive/Obstetrics                             Anesthesia Physical Anesthesia Plan  ASA: 3  Anesthesia Plan: General   Post-op Pain Management: Minimal or no pain anticipated   Induction: Intravenous  PONV Risk Score and Plan: 2 and Ondansetron, Dexamethasone, Treatment may vary due to age or medical condition and Midazolam  Airway Management Planned: Oral ETT  Additional Equipment: None  Intra-op Plan:   Post-operative Plan: Extubation in OR  Informed Consent: I have reviewed the patients History and Physical, chart, labs and discussed the procedure including the risks, benefits and alternatives for the proposed anesthesia with the patient or authorized representative who has indicated his/her understanding and acceptance.     Dental advisory given  Plan Discussed with:   Anesthesia Plan Comments:         Anesthesia Quick Evaluation

## 2022-03-06 DIAGNOSIS — R7989 Other specified abnormal findings of blood chemistry: Secondary | ICD-10-CM

## 2022-03-06 DIAGNOSIS — K8309 Other cholangitis: Secondary | ICD-10-CM | POA: Diagnosis not present

## 2022-03-06 DIAGNOSIS — K8050 Calculus of bile duct without cholangitis or cholecystitis without obstruction: Secondary | ICD-10-CM | POA: Diagnosis not present

## 2022-03-06 DIAGNOSIS — R579 Shock, unspecified: Secondary | ICD-10-CM | POA: Diagnosis not present

## 2022-03-06 LAB — GLUCOSE, CAPILLARY
Glucose-Capillary: 224 mg/dL — ABNORMAL HIGH (ref 70–99)
Glucose-Capillary: 122 mg/dL — ABNORMAL HIGH (ref 70–99)
Glucose-Capillary: 119 mg/dL — ABNORMAL HIGH (ref 70–99)
Glucose-Capillary: 158 mg/dL — ABNORMAL HIGH (ref 70–99)

## 2022-03-06 LAB — COMPREHENSIVE METABOLIC PANEL
ALT: 101 U/L — ABNORMAL HIGH (ref 0–44)
AST: 37 U/L (ref 15–41)
Albumin: 2.8 g/dL — ABNORMAL LOW (ref 3.5–5.0)
Alkaline Phosphatase: 138 U/L — ABNORMAL HIGH (ref 38–126)
Anion gap: 8 (ref 5–15)
BUN: 12 mg/dL (ref 8–23)
CO2: 27 mmol/L (ref 22–32)
Calcium: 8.6 mg/dL — ABNORMAL LOW (ref 8.9–10.3)
Chloride: 106 mmol/L (ref 98–111)
Creatinine, Ser: 1.09 mg/dL (ref 0.61–1.24)
GFR, Estimated: >60 mL/min (ref >60)
Glucose, Bld: 104 mg/dL — ABNORMAL HIGH (ref 70–99)
Potassium: 3.9 mmol/L (ref 3.5–5.1)
Sodium: 141 mmol/L (ref 135–145)
Total Bilirubin: 1.5 mg/dL — ABNORMAL HIGH (ref 0.3–1.2)
Total Protein: 6.1 g/dL — ABNORMAL LOW (ref 6.5–8.1)

## 2022-03-06 MED ORDER — ENOXAPARIN SODIUM 40 MG/0.4ML IJ SOSY
40.0000 mg | PREFILLED_SYRINGE | INTRAMUSCULAR | Status: DC
  Administered 2022-03-06 – 2022-03-07 (×2): 40 mg via SUBCUTANEOUS
  Filled 2022-03-06 (×2): qty 0.4

## 2022-03-06 NOTE — Progress Notes (Signed)
Progress Note Patient: Herbert May OFB:[id number] DOB: Feb 24, 1941 DOA: 03/02/2022  DOS: the patient was seen and examined on 03/06/2022  Brief hospital course: 81 year old male with PMH of CAD SP PCI, type II DM, HTN, HLD, GERD, COPD, chronic hypoxic respiratory failure on 3 to 4 L of oxygen, former smoker.  Presented to the hospital with complaints of back pain progressively worsening to involve the front of the abdomen.  Found to have acute liver injury with ultrasound showing evidence of gallbladder thickening and stone and normal CBD.  Patient was in septic shock hock at [facility], was originally admitted at ICU at [facility].  GI cardiology and general surgery following.  PCCM signed off. GI signed off. General surgery recommending conservative management with antibiotics only. Assessment and Plan: Septic shock present on admission.  Secondary to acute cholangitis and cholecystitis Presents at [facility] with back pain worsening abdominal pain.  Was hypotensive at arrival and was started on Levophed after hydration. Cause of septic shock is is a new cholangitis and cholecystitis. Started on IV Zosyn.  Continue for now. GI was consulted, Underwent ERCP on 9/7.  5 stones removed.  Currently no fever.  LFT trending down. Surgery was initially considering open cholecystectomy but due to risk and the patient's desire to go home as soon as possible currently managed treat with antibiotics only. On heart healthy diet.  Choledocholithiasis Cholelithiasis CBD stone. Elevated LFT and bilirubin. General surgery following. CT scan shows 8 mm stone in the CBD.  Removed with ERCP. Plavix on hold since September 3.  Initially general surgery was considering open cholecystectomy versus PERC drain. Currently recommending antibiotic only. Avoid hepatotoxic medications.  CAD SP PCI Moderate aortic stenosis Diastolic dysfunction Cardiology consulted. Appreciate assistance.  Preoperatively no  further work-up necessary. Plavix on hold since September 3.  Resume 3 days after ERCP.  COPD, chronic respiratory failure Does not appear to have any exacerbation right now. On chronic oxygen at nighttime. Continue inhalers.  Chronic pain on opioids. Patient is on fentanyl patch 100 mcg at home along with Percocet. We will continue the same medications.  Type 2 diabetes mellitus, controlled without any long-term insulin use with HLD Continue sliding scale insulin. Reason hemoglobin A1c 6.1.  HLD. Holding statin right now.  GERD. Continue PPI. Dose increased to twice daily for now. Management per GI pending ERCP.  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 Management per surgery.  4.4 x 1 cm soft tissue density in omentum Repeat CT scan in 3 months.  Hypomagnesemia. We will replace.  Subjective: No nausea no vomiting no fever no chills.  Abdominal pain.  No diarrhea.  Physical Exam: Vitals:   03/06/22 0430 03/06/22 0837 03/06/22 0929 03/06/22 1637  BP: 131/69  105/64 135/85  Pulse: 77  78 76  Resp: '16  17 18  '$ Temp: 98.4 F (36.9 C)  98.1 F (36.7 C) 97.9 F (36.6 C)  TempSrc: Oral  Oral Oral  SpO2: 96% 95% 93% 94%  Weight:      Height:      General: Appear in mild distress; no visible Abnormal Neck Mass Or lumps, Conjunctiva normal Cardiovascular: S1 and S2 Present, aortic systolic  Murmur, Respiratory: good respiratory effort, Bilateral Air entry present and faint basal Crackles, no wheezes Abdomen: Bowel Sound present, Non tender Extremities: no Pedal edema Neurology: alert and oriented to Self, Place and time.   Data Reviewed: I have Reviewed nursing notes, Vitals, and Lab results since  pt's last encounter. Pertinent lab results CBC and CMP I have ordered test including CBC and CMP I have reviewed the last note from GI,  I have discussed pt's care plan and test results with general surgery.     Family Communication: No one at bedside  Disposition: Status is: Inpatient Remains inpatient appropriate because: Monitor overnight for stability on regular diet.  Author: Berle Mull, MD 03/06/2022 6:42 PM  Please look on [email or web address] to find out who is on call.

## 2022-03-06 NOTE — TOC Progression Note (Signed)
Transition of Care [facility]) - Progression Note    Patient Details  Name: Herbert May MRN: [id number] Date of Birth: [birth year]  Transition of [facility] CM/SW Contact  Tom-Johnson, Renea Ee, RN Phone Number: 03/06/2022, 12:41 PM  Clinical Narrative:     Patient is S/P ERCP with Sphincterotomy on 03/05/22. Plan is for noon-operative management of possible Cholecystitis with oral abx.  No TOC needs noted at this time. CM will continue to follow as patient progresses with care.   Expected Discharge Plan: Home/Self Care Barriers to Discharge: Continued Medical Work up  Expected Discharge Plan and Services Expected Discharge Plan: Home/Self Care       Living arrangements for the past 2 months: Single Family Home                 DME Arranged: N/A DME Agency: NA                   Social Determinants of Health (SDOH) Interventions    Readmission Risk Interventions     No data to display

## 2022-03-06 NOTE — Progress Notes (Signed)
1 Day Post-Op  Subjective: CC: Reports no abdominal pain. Tolerated a big breakfast this AM without reported pain, nausea, or vomiting. Currently sittong on commode trying to have a BM. He states his priority is getting home to his wife who has a seizure disorder.  Objective: Vital signs in last 24 hours: Temp:  [97.7 F (36.5 C)-98.4 F (36.9 C)] 98.1 F (36.7 C) (09/08 0929) Pulse Rate:  [72-88] 78 (09/08 0929) Resp:  [16-22] 17 (09/08 0929) BP: (105-144)/(62-95) 105/64 (09/08 0929) SpO2:  [91 %-98 %] 93 % (09/08 0929) Last BM Date : 03/04/22  Intake/Output from previous day: 09/07 0701 - 09/08 0700 In: 546.4 [I.V.:500; IV Piggyback:46.4] Out: 1300 [Urine:1300] Intake/Output this shift: No intake/output data recorded.  PE: Gen:  Alert, NAD, pleasant Abd:  Soft, non tender, Noted midline incisional hernia that feels wide mouth and appears and is reducible but recurs given size. R anterior abdominal wall hernia over old ostomy scan that is reducible but appears to recur again after patient coughs.   Lab Results:  Recent Labs    03/04/22 0527 03/05/22 1123  WBC 6.3 4.4  HGB 12.5* 12.9*  HCT 37.6* 37.9*  PLT 154 193   BMET Recent Labs    03/04/22 0527 03/05/22 1123  NA 138 140  K 4.2 3.8  CL 106 108  CO2 26 25  GLUCOSE 111* 114*  BUN 9 8  CREATININE 0.98 1.00  CALCIUM 8.5* 8.3*   PT/INR No results for input(s): "LABPROT", "INR" in the last 72 hours. CMP     Component Value Date/Time   NA 140 03/05/2022 1123   NA 138 02/08/2014 1236   K 3.8 03/05/2022 1123   CL 108 03/05/2022 1123   CO2 25 03/05/2022 1123   GLUCOSE 114 (H) 03/05/2022 1123   BUN 8 03/05/2022 1123   BUN 9 02/08/2014 1236   CREATININE 1.00 03/05/2022 1123   CREATININE 1.09 12/12/2012 1113   CALCIUM 8.3 (L) 03/05/2022 1123   PROT 5.5 (L) 03/05/2022 1123   PROT 6.5 02/08/2014 1236   ALBUMIN 2.5 (L) 03/05/2022 1123   ALBUMIN 4.5 02/08/2014 1236   AST 41 03/05/2022 1123   ALT 126  (H) 03/05/2022 1123   ALKPHOS 127 (H) 03/05/2022 1123   BILITOT 2.1 (H) 03/05/2022 1123   GFRNONAA >60 03/05/2022 1123   GFRNONAA 68 12/12/2012 1113   GFRAA >60 08/23/2015 1943   GFRAA 79 12/12/2012 1113   Lipase     Component Value Date/Time   LIPASE 18 03/02/2022 1634    Studies/Results: DG ERCP  Result Date: 03/05/2022 CLINICAL DATA:  81 year old male with choledocholithiasis EXAM: ERCP TECHNIQUE: Multiple spot images obtained with the fluoroscopic device and submitted for interpretation post-procedure. FLUOROSCOPY: Radiation Exposure Index (as provided by the fluoroscopic device): 28.7 mGy Kerma COMPARISON:  CT 03/02/2022 FINDINGS: Limited intraoperative fluoroscopic spot images during ERCP. Initial image demonstrates the endoscope projecting over the upper abdomen. Subsequently there is cannulation of the ampulla with retrograde infusion of contrast partially opacifying the intrahepatic and extrahepatic biliary system. Partial opacification of the gallbladder demonstrates multiple geometric stones. Deployment of a retrieval balloon for treatment of choledocholithiasis. IMPRESSION: Limited images during ERCP demonstrates treatment of choledocholithiasis with deployment of a retrieval balloon. Please refer to the dictated operative report for full details of intraoperative findings and procedure. Electronically Signed   By: Corrie Mckusick D.O.   On: 03/05/2022 14:46    Anti-infectives: Anti-infectives (From admission, onward)    Start  Dose/Rate Route Frequency Ordered Stop   03/02/22 1745  piperacillin-tazobactam (ZOSYN) IVPB 3.375 g        3.375 g 12.5 mL/hr over 240 Minutes Intravenous Every 8 hours 03/02/22 1651     03/02/22 1700  piperacillin-tazobactam (ZOSYN) IVPB 3.375 g  Status:  Discontinued        3.375 g 100 mL/hr over 30 Minutes Intravenous  Once 03/02/22 1604 03/02/22 1651        Assessment/Plan Choledocholithiasis, possible Cholangitis Cholecystitis - Workup  revealing Acute Cholecystitis changes on [place] and Choledocholithiasis on CT w/ elevated LFT's on lab work. AT [facility] noted to be in septic shock requiring Levo so was transferred to [facility]. He is now off Levo, tachycardia resolved, no hypotension, and wbc wnl - S/P ERCP with sphincterotomy 9/7 Dr. Fuller Plan - LFTs pending today but have been down-trending.  - Given his surgical history he would likely need to be an open cholecystectomy. Cardiology has cleared the patient for surgery but do mention concern about his underlying pulmonary disease.   The patient is currently showing no signs of cholecystitis. Choledocholithiasis was cleared yesterday. His goal is to get home to his wife. Recommend attempted non-operative management of possible  cholecystitis with antibiotics alone and PO trial. If he has RUQ pain, worsening leukocytosis, or nausea/vomiting he may need per chole tube vs cholecystectomy. Perc chole tube may allow him to get home to his wife quicker but would only be a temporizing measure for his gallbladder disease. Cholecystectomy would increase his risk for pulmonary complications, going back to ICU, prolonged hospital stay, etc.    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 Has seen Gen Surgery, Dr. Tally Due at [facility] for this. Denies obstructive symptoms. 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 Exam as noted above. Will monitor but suspect can f/u as outpatient for this   - Per CCM -  Hx of CAD s/p stents x 3 on Plavix (last dose 03/01/22 per chart) COPD on 3-5L o2 at night DM2 HTN HLD GERD Incidental finding - per Radiology there is a 4.4 x 1.0 cm focus of soft tissue density in the omentum/peritoneum of the anterior midline abdominal wall. They recommended follow-up CT in 3 months.   LOS: 4 days    [facility] 03/06/2022, 11:03 AM Please see Amion for pager number during day hours 7:00am-4:30pm

## 2022-03-06 NOTE — Progress Notes (Signed)
Mobility Specialist Progress Note:   03/06/22 1100  Mobility  Activity Ambulated with assistance in hallway  Level of Assistance Contact guard assist, steadying assist  Assistive Device Front wheel walker  Distance Ambulated (ft) 400 ft  Activity Response Tolerated well  $Mobility charge 1 Mobility   Pt eager for mobility session. Required steadying assist throughout. Pt left in chair with all needs met.   Nelta Numbers Acute Rehab Secure Chat or Office Phone: [phone]

## 2022-03-06 NOTE — Progress Notes (Addendum)
Progress Note  Primary GI: Unassigned ( [place] 2013)   Subjective  Chief Complaint: Choledocholithiasis status post ERCP 09/07  Patient lying in bed, denies any abdominal pain, nausea vomiting. Patient states he had eggs, toast and bacon for breakfast and tolerated well. Patient has right wrist bandage, states he received an endoscopy unit. Patient is eager to go home.    Objective   Vital signs in last 24 hours: Temp:  [97.7 F (36.5 C)-98.4 F (36.9 C)] 98.1 F (36.7 C) (09/08 0929) Pulse Rate:  [77-88] 78 (09/08 0929) Resp:  [16-21] 17 (09/08 0929) BP: (105-144)/(62-95) 105/64 (09/08 0929) SpO2:  [91 %-96 %] 93 % (09/08 0929) Last BM Date : 03/04/22 Last BM recorded by nurses in past 5 days Stool Type: Type 4 (Like a smooth, soft sausage or snake) (03/04/2022  5:00 PM)  General:   Pleasant, obese male in no acute distress Heart: Tachycardic, no murmurs rubs or gallops. Pulm: Patient with cough, diffuse decreased breath sounds but no wheezing or rhonchi.  Not on oxygen currently. Abdomen:  Soft, Obese AB, large healed ventral scar with ventral hernia, Sluggish bowel sounds.  Nontender without guarding and Without rebound, No organomegaly appreciated. Extremities:  With edema.  Right wrist wrapped, unwrapped with patient had 4 cm skin abrasion, no warmth, discharge.  Applied sterile gel rewrapped and notified nurse. Neurologic:  Alert and  oriented x4;  No focal deficits.  Psychiatric:  Cooperative. Normal mood and affect.  Intake/Output from previous day: 09/07 0701 - 09/08 0700 In: 546.4 [I.V.:500; IV Piggyback:46.4] Out: 1300 [Urine:1300] Intake/Output this shift: Total I/O In: 480 [P.O.:480] Out: -   Studies/Results: DG ERCP  Result Date: 03/05/2022 CLINICAL DATA:  81 year old male with choledocholithiasis EXAM: ERCP TECHNIQUE: Multiple spot images obtained with the fluoroscopic device and submitted for interpretation post-procedure. FLUOROSCOPY: Radiation  Exposure Index (as provided by the fluoroscopic device): 28.7 mGy Kerma COMPARISON:  CT 03/02/2022 FINDINGS: Limited intraoperative fluoroscopic spot images during ERCP. Initial image demonstrates the endoscope projecting over the upper abdomen. Subsequently there is cannulation of the ampulla with retrograde infusion of contrast partially opacifying the intrahepatic and extrahepatic biliary system. Partial opacification of the gallbladder demonstrates multiple geometric stones. Deployment of a retrieval balloon for treatment of choledocholithiasis. IMPRESSION: Limited images during ERCP demonstrates treatment of choledocholithiasis with deployment of a retrieval balloon. Please refer to the dictated operative report for full details of intraoperative findings and procedure. Electronically Signed   By: Corrie Mckusick D.O.   On: 03/05/2022 14:46    Lab Results: Recent Labs    03/04/22 0527 03/05/22 1123  WBC 6.3 4.4  HGB 12.5* 12.9*  HCT 37.6* 37.9*  PLT 154 193   BMET Recent Labs    03/04/22 0527 03/05/22 1123 03/06/22 1146  NA 138 140 141  K 4.2 3.8 3.9  CL 106 108 106  CO2 '26 25 27  '$ GLUCOSE 111* 114* 104*  BUN '9 8 12  '$ CREATININE 0.98 1.00 1.09  CALCIUM 8.5* 8.3* 8.6*   LFT Recent Labs    03/06/22 1146  PROT 6.1*  ALBUMIN 2.8*  AST 37  ALT 101*  ALKPHOS 138*  BILITOT 1.5*   PT/INR No results for input(s): "LABPROT", "INR" in the last 72 hours.    Patient profile:   81 year old male with choledocholithiasis and acute cholangitis with septic shock, tachycardia, hypertension and white blood cell resolved  status post ERCP 09/07 with Dr. Fuller Plan after Plavix washout   Impression/Plan:     Choledocholithiasis/  acute cholangitis WBC 4.4 HGB 12.9 Platelets 193 AST 37 ALT 101  Alkphos 138 TBili 1.5 LFTs down trended, white blood cell count resolved CT :with contrast showed cholelithiasis with choledocholithiasis 8 mm stone in common bile duct without biliary  dilatation. [place]: showed gallbladder wall thickening with stones normal CBD. Status post ERCP 09/07 with sphincterectomy and 5 stone removal Patient's diet advanced Tolerating well Surgery has seen the patient, no signs of cholecystitis, patient's goal is to go home, will plan for outpatient antibiotics alone and p.o. trial.   Coronary artery disease Plavix on hold since the third, cardiology has seen the patient.   COPD with history of chronic respiratory failure on oxygen at night No flare currently.   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  [facility] will sign off.  Please contact us if we can be of any further assistance during this hospital stay.     LOS: 4 days   Vladimir Crofts  03/06/2022, 2:48 PM    Attending physician's note   I have taken a history, reviewed the chart and examined the patient. I performed a substantive portion of this encounter, including complete performance of at least one of the key components, in conjunction with the APP. I agree with the APP's note, impression and recommendations.    S/p ERCP yesterday. Leukocytosis has resolved Continue antibiotics to complete 7-day course  Follow-up with surgery for possible cholecystectomy to prevent recurrent episode  GI will sign off, available if have any questions The patient was provided an opportunity to ask questions and all were answered. The patient agreed with the plan and demonstrated an understanding of the instructions.   Damaris Hippo , MD [phone]

## 2022-03-07 ENCOUNTER — Encounter (HOSPITAL_COMMUNITY): Payer: Self-pay | Admitting: Gastroenterology

## 2022-03-07 DIAGNOSIS — R579 Shock, unspecified: Secondary | ICD-10-CM | POA: Diagnosis not present

## 2022-03-07 LAB — GLUCOSE, CAPILLARY
Glucose-Capillary: 116 mg/dL — ABNORMAL HIGH (ref 70–99)
Glucose-Capillary: 148 mg/dL — ABNORMAL HIGH (ref 70–99)

## 2022-03-07 MED ORDER — AMOXICILLIN-POT CLAVULANATE 875-125 MG PO TABS: 1.0000 | ORAL_TABLET | Freq: Two times a day (BID) | ORAL | 0 refills | Status: AC

## 2022-03-07 MED ORDER — SACCHAROMYCES BOULARDII 250 MG PO CAPS: 250.0000 mg | ORAL_CAPSULE | Freq: Two times a day (BID) | ORAL | 0 refills | Status: AC

## 2022-03-07 MED ORDER — DOCUSATE SODIUM 100 MG PO CAPS: 100.0000 mg | ORAL_CAPSULE | Freq: Two times a day (BID) | ORAL | 0 refills | Status: DC | PRN

## 2022-03-07 MED ORDER — FENTANYL 100 MCG/HR TD PT72
1.0000 | MEDICATED_PATCH | TRANSDERMAL | Status: DC
  Administered 2022-03-07: 1 via TRANSDERMAL
  Filled 2022-03-07 (×2): qty 1

## 2022-03-07 NOTE — TOC Transition Note (Signed)
Transition of Care [facility]) - CM/SW Discharge Note   Patient Details  Name: Herbert May MRN: [id number] Date of Birth: 1941/01/19  [facility] CM/SW Contact:  Bartholomew Crews, RN Phone Number: [phone] 03/07/2022, 2:39 PM   Clinical Narrative:     Spoke with patient at the bedside to confirm need for assistance with transportation. Patient lives in [place]. He does not have anyone who can pick him up. He cannot afford $110 cost for taxi voucher. Approval for taxi voucher provided by [facility] on-call leadership, Boris Lown. Taxi voucher provided to nursing to call when ready to assist patient toward exit. No further TOC needs identified at this time.   Final next level of care: Home/Self Care Barriers to Discharge: No Barriers Identified   Patient Goals and CMS Choice Patient states their goals for this hospitalization and ongoing recovery are:: home with wife CMS Medicare.gov Compare Post Acute Care list provided to:: Patient Choice offered to / list presented to : NA  Discharge Placement                       Discharge Plan and Services                DME Arranged: N/A DME Agency: NA       HH Arranged: NA HH Agency: NA        Social Determinants of Health (SDOH) Interventions     Readmission Risk Interventions     No data to display

## 2022-03-07 NOTE — Discharge Summary (Signed)
Physician Discharge Summary   Patient: Barnell Shieh MRN: [id number] DOB: 06/10/1941  Admit date:     03/02/2022  Discharge date: 03/07/2022  Discharge Physician: Berle Mull  PCP: Leonie Douglas, MD  Recommendations at discharge: Follow-up with PCP and general surgery as recommended. Needed repeat follow-up CT scan of abdomen in 3 months due to omental mass seen on CT scan in the hospital   Follow-up Information     Leonie Douglas, MD. Schedule an appointment as soon as possible for a visit in 1 week(s).   Specialties: Family Medicine, Sports Medicine Why: Need a follow-up CT abdomen/stomch in 3 months. Contact information: [address] [place] [address] [phone]         Reino Kent, MD. Schedule an appointment as soon as possible for a visit in 2 week(s).   Specialty: General Surgery Contact information: [address] [place] [address] [phone]         Surgery, [place]. Call.   Specialty: General Surgery Why: As needed Contact information: [place] [address] [phone]                Discharge Diagnoses: Principal Problem:   Shock [facility]) Active Problems:   Cholangitis   Choledocholithiasis   Elevated LFTs  Hospital Course: 81 year old male with PMH of CAD SP PCI, type II DM, HTN, HLD, GERD, COPD, chronic hypoxic respiratory failure on 3 to 4 L of oxygen, former smoker.  Presented to the hospital with complaints of back pain progressively worsening to involve the front of the abdomen.  Found to have acute liver injury with ultrasound showing evidence of gallbladder thickening and stone and normal CBD.  Patient was in septic shock hock at [facility], was originally admitted at ICU at [facility].  GI cardiology and general surgery following.  PCCM signed off. GI signed off. General surgery recommending conservative management with antibiotics only. Assessment and Plan  Septic  shock present on admission.  Secondary to acute cholangitis and cholecystitis Presents at [facility] with back pain worsening abdominal pain.  Was hypotensive at arrival and was started on Levophed after hydration. Cause of septic shock is is a new cholangitis and cholecystitis. Started on IV Zosyn.  Continue for now. GI was consulted, Underwent ERCP on 9/7.  5 stones removed.  Currently no fever.  LFT trending down. Surgery was initially considering open cholecystectomy but due to risk and the patient's desire to go home as soon as possible currently managed treat with antibiotics only. On heart healthy diet.  Tolerating very well.   Choledocholithiasis Cholelithiasis CBD stone. Elevated LFT and bilirubin. General surgery following. CT scan shows 8 mm stone in the CBD.  Removed with ERCP. Plavix on hold since September 3.  Initially general surgery was considering open cholecystectomy versus PERC drain. Currently recommending antibiotic only.  We will provide a 10-day treatment course. Avoid hepatotoxic medications.   CAD SP PCI Moderate aortic stenosis Diastolic dysfunction Cardiology consulted. Appreciate assistance.  Preoperatively no further work-up necessary. Plavix on hold since September 3.  Resume 3 days after ERCP which will be 9/10.   COPD, chronic respiratory failure Does not appear to have any exacerbation right now. On chronic oxygen at nighttime. Continue inhalers.   Chronic pain on opioids. Patient is on fentanyl patch 100 mcg at home along with Percocet. We will continue the same medications.   Type 2 diabetes mellitus, controlled without any long-term insulin use with HLD hemoglobin  A1c 6.1.  Resume home regimen   HLD. Resume statin.   GERD. Continue PPI.  Resume home regimen.   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 Management per surgery.   4.4 x 1 cm soft tissue density in  omentum Repeat CT scan in 3 months.   Hypomagnesemia. Replaced.  Consultants:  General surgery GI Cardiology  Procedures performed:  ERCP  DISCHARGE MEDICATION: Allergies as of 03/07/2022       Reactions   Aspirin Hives, Itching   Fish Allergy Nausea And Vomiting   Onion Nausea And Vomiting   Other Other (See Comments)   Just doesn't like [place]    Tape Other (See Comments)   Skin very sensitive, easily tears   Levaquin [levofloxacin In D5w] Rash   Neosporin [neomycin-bacitracin Zn-polymyx] Rash        Medication List     STOP taking these medications    ibuprofen 600 MG tablet Commonly known as: ADVIL   ipratropium-albuterol 0.5-2.5 (3) MG/3ML Soln Commonly known as: DUONEB   metoprolol succinate 25 MG 24 hr tablet Commonly known as: TOPROL-XL   midodrine 5 MG tablet Commonly known as: PROAMATINE   Norepinephrine-Dextrose 8-5 MG/250ML-% Soln   piperacillin-tazobactam 4-0.5 GM/100ML Soln IVPB Commonly known as: ZOSYN   SUBLIMAZE IJ   traZODone 100 MG tablet Commonly known as: DESYREL       TAKE these medications    albuterol 108 (90 Base) MCG/ACT inhaler Commonly known as: VENTOLIN HFA Inhale 2 puffs into the lungs every 6 (six) hours as needed for wheezing or shortness of breath. What changed: Another medication with the same name was changed. Make sure you understand how and when to take each.   albuterol (2.5 MG/3ML) 0.083% nebulizer solution Commonly known as: PROVENTIL INHALE CONTENTS OF 1 VIAL IN NEBULIZER EVERY 6 HOURS AS NEEDED FOR WHEEZING OR SHORTNESS OF BREATH. What changed: See the new instructions.   amoxicillin-clavulanate 875-125 MG tablet Commonly known as: AUGMENTIN Take 1 tablet by mouth 2 (two) times daily for 10 days.   clopidogrel 75 MG tablet Commonly known as: PLAVIX Take 1 tablet (75 mg total) by mouth daily.   docusate sodium 100 MG capsule Commonly known as: COLACE Take 1 capsule (100 mg total) by mouth 2 (two)  times daily as needed for mild constipation.   fentaNYL 100 MCG/HR Commonly known as: DURAGESIC Apply one patch to skin every 3 days if tolerated What changed:  how much to take when to take this additional instructions   fluticasone 50 MCG/ACT nasal spray Commonly known as: FLONASE Place 2 sprays into both nostrils daily.   gabapentin 100 MG capsule Commonly known as: NEURONTIN Take 200 mg by mouth 2 (two) times daily.   lidocaine 5 % Commonly known as: LIDODERM Place 1 patch onto the skin daily as needed (pain).   metFORMIN 1000 MG tablet Commonly known as: GLUCOPHAGE Take 1,000 mg by mouth at bedtime.   methocarbamol 500 MG tablet Commonly known as: ROBAXIN Take 500 mg by mouth at bedtime as needed for muscle spasms.   nitroGLYCERIN 0.4 MG SL tablet Commonly known as: NITROSTAT Place 0.4 mg under the tongue every 5 (five) minutes as needed for chest pain.   oxybutynin 10 MG 24 hr tablet Commonly known as: DITROPAN-XL Take 10 mg by mouth at bedtime.   oxyCODONE-acetaminophen 10-325 MG tablet Commonly known as: PERCOCET Take 1 tablet by mouth every 6 (six) hours as needed for pain.  pantoprazole 40 MG tablet Commonly known as: PROTONIX Take 1 tablet (40 mg total) by mouth 2 (two) times daily. What changed: when to take this   potassium chloride SA 20 MEQ tablet Commonly known as: KLOR-CON M Take 20 mEq by mouth daily.   pravastatin 40 MG tablet Commonly known as: PRAVACHOL Take 40 mg by mouth at bedtime.   saccharomyces boulardii 250 MG capsule Commonly known as: Florastor Take 1 capsule (250 mg total) by mouth 2 (two) times daily for 10 days.   sodium chloride 0.9 % Inject 2,436 mLs into the vein See admin instructions. Sodium Chloride 0.9% bolus, 2436 ml over 1 hour for 1 dose.   tamsulosin 0.4 MG Caps capsule Commonly known as: FLOMAX Take 0.4 mg by mouth at bedtime.   Tiadylt ER 120 MG 24 hr capsule Generic drug: diltiazem Take 120 mg by mouth  daily.   VITAMIN B-12 PO Take 1 tablet by mouth daily.       Disposition: Home Diet recommendation: Carb modified diet  Discharge Exam: Vitals:   03/06/22 2300 03/07/22 0500 03/07/22 0611 03/07/22 0842  BP:   (!) 125/58 128/62  Pulse:   (!) 52 75  Resp:   17 16  Temp:   (!) 97.5 F (36.4 C) 97.8 F (36.6 C)  TempSrc:   Oral Oral  SpO2:   94% 93%  Weight: 82.2 kg 82.4 kg    Height:       General: Appear in no distress; no visible Abnormal Neck Mass Or lumps, Conjunctiva normal Cardiovascular: S1 and S2 Present, aortic systolic  Murmur, Respiratory: good respiratory effort, Bilateral Air entry present and faint basal crackles, no wheezes Abdomen: Bowel Sound present, Non tender  Extremities: no Pedal edema Neurology: alert and oriented to time, place, and person  [facility] Weights   03/04/22 2139 03/06/22 2300 03/07/22 0500  Weight: 84.6 kg 82.2 kg 82.4 kg   Condition at discharge: stable  The results of significant diagnostics from this hospitalization (including imaging, microbiology, ancillary and laboratory) are listed below for reference.   Imaging Studies: DG ERCP  Result Date: 03/05/2022 CLINICAL DATA:  81 year old male with choledocholithiasis EXAM: ERCP TECHNIQUE: Multiple spot images obtained with the fluoroscopic device and submitted for interpretation post-procedure. FLUOROSCOPY: Radiation Exposure Index (as provided by the fluoroscopic device): 28.7 mGy Kerma COMPARISON:  CT 03/02/2022 FINDINGS: Limited intraoperative fluoroscopic spot images during ERCP. Initial image demonstrates the endoscope projecting over the upper abdomen. Subsequently there is cannulation of the ampulla with retrograde infusion of contrast partially opacifying the intrahepatic and extrahepatic biliary system. Partial opacification of the gallbladder demonstrates multiple geometric stones. Deployment of a retrieval balloon for treatment of choledocholithiasis. IMPRESSION: Limited images during  ERCP demonstrates treatment of choledocholithiasis with deployment of a retrieval balloon. Please refer to the dictated operative report for full details of intraoperative findings and procedure. Electronically Signed   By: Corrie Mckusick D.O.   On: 03/05/2022 14:46   CT ABDOMEN PELVIS W WO CONTRAST  Result Date: 03/03/2022 CLINICAL DATA:  Acute nonlocalized abdominal pain. EXAM: CT ABDOMEN AND PELVIS WITHOUT AND WITH CONTRAST TECHNIQUE: Multidetector CT imaging of the abdomen and pelvis was performed following the standard protocol before and following the bolus administration of intravenous contrast. RADIATION DOSE REDUCTION: This exam was performed according to the departmental dose-optimization program which includes automated exposure control, adjustment of the mA and/or kV according to patient size and/or use of iterative reconstruction technique. CONTRAST:  174m OMNIPAQUE IOHEXOL 300 MG/ML  SOLN COMPARISON:  None Available. FINDINGS: Lower chest: Centrilobular emphsyema noted.Calcified and noncalcified micro nodularity noted right lower lobe with areas of subsegmental atelectasis at the right base. Hepatobiliary: Tiny hypodensities in the right liver are too small to characterize but likely benign. Gallstones measure up to 12 mm diameter. Despite the lack of biliary dilatation, an 8 mm stone is identified in the common bile duct (axial 24/6 and coronal 44/12). Pancreas: No focal mass lesion. No dilatation of the main duct. No intraparenchymal cyst. No peripancreatic edema. Spleen: Calcified granulomata. Adrenals/Urinary Tract: No adrenal nodule or mass. 3 nonobstructing stones are seen in the right kidney measuring up to 10 mm in the lower pole region. Bilateral renal cysts evident. No evidence for hydroureter. The urinary bladder appears normal for the degree of distention. Stomach/Bowel: Stomach is unremarkable. No gastric wall thickening. No evidence of outlet obstruction. Duodenum is normally  positioned as is the ligament of Treitz. Duodenal diverticulum noted. No small bowel wall thickening. No small bowel dilatation. Status post right hemicolectomy. Diverticular changes are noted in the left colon without evidence of diverticulitis. Vascular/Lymphatic: There is moderate atherosclerotic calcification of the abdominal aorta without aneurysm. There is no gastrohepatic or hepatoduodenal ligament lymphadenopathy. No retroperitoneal or mesenteric lymphadenopathy. No pelvic sidewall lymphadenopathy. Reproductive: The prostate gland and seminal vesicles are unremarkable. Other: No intraperitoneal free fluid. 4.4 x 1.0 cm focus of abnormal soft tissue is identified in the midline anterior abdomen, involving the omentum/anterior peritoneum (31/3). Musculoskeletal: No worrisome lytic or sclerotic osseous abnormality. Lumbar fusion hardware evident. 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 Midline wide-mouth hernia contains small bowel without complicating features (49/6). IMPRESSION: 1. Cholelithiasis with choledocholithiasis. 8 mm stone in the common bile duct without biliary dilatation. 2. 4.4 x 1.0 cm focus of soft tissue density in the omentum/peritoneum of the anterior midline abdominal wall. This may be granulation tissue/scarring related to prior surgery, but follow-up recommended to ensure stability, especially if the patient has a cancer history. Consider follow-up CT in 3 months. Alternatively the patient has prior CT imaging of the abdomen, comparison to those older studies to assess chronicity would likely prove helpful. 3. Nonobstructing right nephrolithiasis. 4. Left colonic diverticulosis without diverticulitis. 5. Calcified and noncalcified micro nodularity right lower lobe, likely sequelae of infection/inflammation or potential prior aspiration. 6. Aortic Atherosclerosis (ICD10-I70.0) and Emphysema (ICD10-J43.9).  Electronically Signed   By: Misty Stanley M.D.   On: 03/03/2022 08:20   ECHOCARDIOGRAM COMPLETE  Result Date: 03/02/2022    ECHOCARDIOGRAM REPORT   Patient Name:   FINDLEY VI Date of Exam: 03/02/2022 Medical Rec #:  [id number]         Height:       67.0 in Accession #:    [id number]        Weight:       178.8 lb Date of Birth:  [birth year]          BSA:          1.928 m Patient Age:    81 years          BP:           128/64 mmHg Patient Gender: M                 HR:           78 bpm. Exam Location:  Inpatient Procedure: 2D Echo, Cardiac Doppler, Color Doppler and Intracardiac  Opacification Agent Indications:    Pre-operative evaluation  History:        Patient has prior history of Echocardiogram examinations, most                 recent 09/04/2015. CAD, COPD, Aortic Valve Disease; Risk                 Factors:Hypertension, Dyslipidemia, Former Smoker and Diabetes.                 Shock.  Sonographer:    Clayton Lefort RDCS (AE) Referring Phys: [id number] Candee Furbish  Sonographer Comments: Suboptimal parasternal window and Technically difficult study due to poor echo windows. Image acquisition challenging due to COPD. IMPRESSIONS  1. Left ventricular ejection fraction, by estimation, is 60 to 65%. The left ventricle has normal function. The left ventricle has no regional wall motion abnormalities. There is moderate left ventricular hypertrophy. Left ventricular diastolic parameters are consistent with Grade I diastolic dysfunction (impaired relaxation).  2. Right ventricular systolic function is normal. The right ventricular size is normal. Tricuspid regurgitation signal is inadequate for assessing PA pressure.  3. The mitral valve is abnormal. Trivial mitral valve regurgitation. Moderate mitral annular calcification.  4. The aortic valve is tricuspid. There is mild calcification of the aortic valve. Aortic valve regurgitation is not visualized. Mild to moderate aortic valve stenosis. Aortic valve area, by  VTI measures 1.47 cm. Aortic valve mean gradient measures 8.0  mmHg. Aortic valve Vmax measures 2.06 m/s.  5. The inferior vena cava is dilated in size with >50% respiratory variability, suggesting right atrial pressure of 8 mmHg. Comparison(s): Changes from prior study are noted. 09/04/2015: LVEF 55-60%, aortic sclerosis without stenosis. FINDINGS  Left Ventricle: Left ventricular ejection fraction, by estimation, is 60 to 65%. The left ventricle has normal function. The left ventricle has no regional wall motion abnormalities. Definity contrast agent was given IV to delineate the left ventricular  endocardial borders. The left ventricular internal cavity size was normal in size. There is moderate left ventricular hypertrophy. Left ventricular diastolic parameters are consistent with Grade I diastolic dysfunction (impaired relaxation). Indeterminate filling pressures. Right Ventricle: The right ventricular size is normal. No increase in right ventricular wall thickness. Right ventricular systolic function is normal. Tricuspid regurgitation signal is inadequate for assessing PA pressure. Left Atrium: Left atrial size was normal in size. Right Atrium: Right atrial size was normal in size. Pericardium: There is no evidence of pericardial effusion. Mitral Valve: The mitral valve is abnormal. Moderate mitral annular calcification. Trivial mitral valve regurgitation. Tricuspid Valve: The tricuspid valve is normal in structure. Tricuspid valve regurgitation is not demonstrated. Aortic Valve: The aortic valve is tricuspid. There is mild calcification of the aortic valve. Aortic valve regurgitation is not visualized. Mild to moderate aortic stenosis is present. Aortic valve mean gradient measures 8.0 mmHg. Aortic valve peak gradient measures 17.0 mmHg. Aortic valve area, by VTI measures 1.47 cm. Pulmonic Valve: The pulmonic valve was not well visualized. Pulmonic valve regurgitation is not visualized. Aorta: The aortic root  and ascending aorta are structurally normal, with no evidence of dilitation. Venous: The inferior vena cava is dilated in size with greater than 50% respiratory variability, suggesting right atrial pressure of 8 mmHg. IAS/Shunts: No atrial level shunt detected by color flow Doppler.  LEFT VENTRICLE PLAX 2D LVIDd:         3.80 cm   Diastology LVIDs:         2.70 cm  LV e' medial:    6.64 cm/s LV PW:         1.60 cm   LV E/e' medial:  13.0 LV IVS:        1.40 cm   LV e' lateral:   10.40 cm/s LVOT diam:     2.20 cm   LV E/e' lateral: 8.3 LV SV:         59 LV SV Index:   31 LVOT Area:     3.80 cm  RIGHT VENTRICLE             IVC RV Basal diam:  3.30 cm     IVC diam: 2.50 cm RV S prime:     13.10 cm/s TAPSE (M-mode): 1.5 cm LEFT ATRIUM             Index        RIGHT ATRIUM           Index LA diam:        3.20 cm 1.66 cm/m   RA Area:     20.20 cm LA Vol (A2C):   54.8 ml 28.42 ml/m  RA Volume:   64.40 ml  33.40 ml/m LA Vol (A4C):   37.9 ml 19.66 ml/m LA Biplane Vol: 49.2 ml 25.52 ml/m  AORTIC VALVE AV Area (Vmax):    1.35 cm AV Area (Vmean):   1.44 cm AV Area (VTI):     1.47 cm AV Vmax:           206.00 cm/s AV Vmean:          135.000 cm/s AV VTI:            0.402 m AV Peak Grad:      17.0 mmHg AV Mean Grad:      8.0 mmHg LVOT Vmax:         73.10 cm/s LVOT Vmean:        51.300 cm/s LVOT VTI:          0.155 m LVOT/AV VTI ratio: 0.39  AORTA Ao Root diam: 3.60 cm Ao Asc diam:  3.50 cm MITRAL VALVE MV Area (PHT): 3.17 cm    SHUNTS MV Decel Time: 239 msec    Systemic VTI:  0.16 m MV E velocity: 86.50 cm/s  Systemic Diam: 2.20 cm MV A velocity: 81.80 cm/s MV E/A ratio:  1.06 Lyman Bishop MD Electronically signed by Lyman Bishop MD Signature Date/Time: 03/02/2022/6:21:49 PM    Final     Microbiology: Results for orders placed or performed during the hospital encounter of 03/02/22  MRSA Next Gen by PCR, Nasal     Status: None   Collection Time: 03/02/22  8:46 PM   Specimen: Nasal Mucosa; Nasal Swab  Result Value  Ref Range Status   MRSA by PCR Next Gen NOT DETECTED NOT DETECTED Final    Comment: (NOTE) The GeneXpert MRSA Assay (FDA approved for NASAL specimens only), is one component of a comprehensive MRSA colonization surveillance program. It is not intended to diagnose MRSA infection nor to guide or monitor treatment for MRSA infections. Test performance is not FDA approved in patients less than 73 years old. Performed at [facility], [place] [address] [place], [place] [address]   Culture, blood (Routine X 2) w Reflex to ID Panel     Status: None (Preliminary result)   Collection Time: 03/04/22 10:22 AM   Specimen: BLOOD RIGHT FOREARM  Result Value Ref Range Status   Specimen Description  BLOOD RIGHT FOREARM  Final   Special Requests   Final    BOTTLES DRAWN AEROBIC AND ANAEROBIC Blood Culture adequate volume   Culture   Final    NO GROWTH 3 DAYS Performed at [facility], [address] [place], [place] [address]    Report Status PENDING  Incomplete  Culture, blood (Routine X 2) w Reflex to ID Panel     Status: None (Preliminary result)   Collection Time: 03/04/22 10:22 AM   Specimen: BLOOD RIGHT WRIST  Result Value Ref Range Status   Specimen Description BLOOD RIGHT WRIST  Final   Special Requests   Final    BOTTLES DRAWN AEROBIC ONLY Blood Culture results may not be optimal due to an inadequate volume of blood received in culture bottles   Culture   Final    NO GROWTH 3 DAYS Performed at [facility], [place] [address], [place],  [address]    Report Status PENDING  Incomplete   Labs: CBC: Recent Labs  Lab 03/03/22 0005 03/04/22 0527 03/05/22 1123  WBC 10.5 6.3 4.4  NEUTROABS  --  4.9 2.8  HGB 13.0 12.5* 12.9*  HCT 39.6 37.6* 37.9*  MCV 93.2 91.9 89.8  PLT 156 154 073   Basic Metabolic Panel: Recent Labs  Lab 03/02/22 1634 03/03/22 0005 03/03/22 0555 03/04/22 0527 03/05/22 1123 03/06/22 1146  NA 141 138  --  138 140 141  K 5.0 4.0  --  4.2  3.8 3.9  CL 109 107  --  106 108 106  CO2 23 23  --  '26 25 27  '$ GLUCOSE 137* 99  --  111* 114* 104*  BUN 12 12  --  '9 8 12  '$ CREATININE 1.20 0.98  --  0.98 1.00 1.09  CALCIUM 8.3* 8.3*  --  8.5* 8.3* 8.6*  MG  --  1.3* 2.3 1.9 1.6*  --   PHOS  --  3.5  --   --   --   --    Liver Function Tests: Recent Labs  Lab 03/02/22 1634 03/03/22 0555 03/04/22 0527 03/05/22 1123 03/06/22 1146  AST 291* 152* 72* 41 37  ALT 329* 264* 184* 126* 101*  ALKPHOS 97 88 103 127* 138*  BILITOT 4.1* 4.6* 2.9* 2.1* 1.5*  PROT 5.2* 5.4* 5.0* 5.5* 6.1*  ALBUMIN 2.8* 2.6* 2.4* 2.5* 2.8*   CBG: Recent Labs  Lab 03/06/22 1128 03/06/22 1636 03/06/22 2113 03/07/22 0725 03/07/22 1143  GLUCAP 122* 119* 158* 116* 148*    Discharge time spent: greater than 30 minutes.  Signed: Berle Mull, MD [facility] 03/07/2022

## 2022-03-07 NOTE — Progress Notes (Signed)
2 Days Post-Op  Subjective: Just finished breakfast without any nausea or abdominal pain. No abdominal pain at rest or with palpation. Has some baseline musculoskeletal pain this am - back and legs. Eager to go home  RN bedside  Objective: Vital signs in last 24 hours: Temp:  [97.5 F (36.4 C)-98.4 F (36.9 C)] 97.5 F (36.4 C) (09/09 0611) Pulse Rate:  [52-78] 52 (09/09 0611) Resp:  [16-18] 17 (09/09 0611) BP: (105-135)/(51-85) 125/58 (09/09 0611) SpO2:  [93 %-95 %] 94 % (09/09 0611) Weight:  [82.2 kg-82.4 kg] 82.4 kg (09/09 0500) Last BM Date : 03/04/22  Intake/Output from previous day: 09/08 0701 - 09/09 0700 In: 1436.7 [P.O.:1160; IV Piggyback:276.7] Out: 400 [Urine:400] Intake/Output this shift: No intake/output data recorded.  PE: Gen:  Alert, NAD, pleasant Abd:  Soft, non tender, Noted midline incisional hernia that feels wide mouth and appears and is reducible but recurs given size. R anterior abdominal wall hernia over old ostomy scan that is reducible but appears to recur again after patient coughs.   Lab Results:  Recent Labs    03/05/22 1123  WBC 4.4  HGB 12.9*  HCT 37.9*  PLT 193    BMET Recent Labs    03/05/22 1123 03/06/22 1146  NA 140 141  K 3.8 3.9  CL 108 106  CO2 25 27  GLUCOSE 114* 104*  BUN 8 12  CREATININE 1.00 1.09  CALCIUM 8.3* 8.6*    PT/INR No results for input(s): "LABPROT", "INR" in the last 72 hours. CMP     Component Value Date/Time   NA 141 03/06/2022 1146   NA 138 02/08/2014 1236   K 3.9 03/06/2022 1146   CL 106 03/06/2022 1146   CO2 27 03/06/2022 1146   GLUCOSE 104 (H) 03/06/2022 1146   BUN 12 03/06/2022 1146   BUN 9 02/08/2014 1236   CREATININE 1.09 03/06/2022 1146   CREATININE 1.09 12/12/2012 1113   CALCIUM 8.6 (L) 03/06/2022 1146   PROT 6.1 (L) 03/06/2022 1146   PROT 6.5 02/08/2014 1236   ALBUMIN 2.8 (L) 03/06/2022 1146   ALBUMIN 4.5 02/08/2014 1236   AST 37 03/06/2022 1146   ALT 101 (H) 03/06/2022  1146   ALKPHOS 138 (H) 03/06/2022 1146   BILITOT 1.5 (H) 03/06/2022 1146   GFRNONAA >60 03/06/2022 1146   GFRNONAA 68 12/12/2012 1113   GFRAA >60 08/23/2015 1943   GFRAA 79 12/12/2012 1113   Lipase     Component Value Date/Time   LIPASE 18 03/02/2022 1634    Studies/Results: DG ERCP  Result Date: 03/05/2022 CLINICAL DATA:  81 year old male with choledocholithiasis EXAM: ERCP TECHNIQUE: Multiple spot images obtained with the fluoroscopic device and submitted for interpretation post-procedure. FLUOROSCOPY: Radiation Exposure Index (as provided by the fluoroscopic device): 28.7 mGy Kerma COMPARISON:  CT 03/02/2022 FINDINGS: Limited intraoperative fluoroscopic spot images during ERCP. Initial image demonstrates the endoscope projecting over the upper abdomen. Subsequently there is cannulation of the ampulla with retrograde infusion of contrast partially opacifying the intrahepatic and extrahepatic biliary system. Partial opacification of the gallbladder demonstrates multiple geometric stones. Deployment of a retrieval balloon for treatment of choledocholithiasis. IMPRESSION: Limited images during ERCP demonstrates treatment of choledocholithiasis with deployment of a retrieval balloon. Please refer to the dictated operative report for full details of intraoperative findings and procedure. Electronically Signed   By: Corrie Mckusick D.O.   On: 03/05/2022 14:46    Anti-infectives: Anti-infectives (From admission, onward)    Start  Dose/Rate Route Frequency Ordered Stop   03/02/22 1745  piperacillin-tazobactam (ZOSYN) IVPB 3.375 g        3.375 g 12.5 mL/hr over 240 Minutes Intravenous Every 8 hours 03/02/22 1651     03/02/22 1700  piperacillin-tazobactam (ZOSYN) IVPB 3.375 g  Status:  Discontinued        3.375 g 100 mL/hr over 30 Minutes Intravenous  Once 03/02/22 1604 03/02/22 1651        Assessment/Plan Choledocholithiasis, possible Cholangitis Cholecystitis - Workup revealing Acute  Cholecystitis changes on [place] and Choledocholithiasis on CT w/ elevated LFT's on lab work. AT [facility] noted to be in septic shock requiring Levo so was transferred to [facility]. He is now off Levo, tachycardia resolved, no hypotension, and wbc wnl - S/P ERCP with sphincterotomy 9/7 Dr. Fuller Plan - LFTs down-trending as of yesterday - Given his surgical history he would likely need to be an open cholecystectomy. Cardiology has cleared the patient for surgery but do mention concern about his underlying pulmonary disease.   Still no signs of cholecystitis. Choledocholithiasis was cleared. His goal is to get home to his wife. Recommend attempted non-operative management of possible  cholecystitis with antibiotics alone and PO trial which he is doing well with. If he has RUQ pain, worsening leukocytosis, or nausea/vomiting he may need per chole tube vs cholecystectomy. Perc chole tube may allow him to get home to his wife quicker but would only be a temporizing measure for his gallbladder disease. Cholecystectomy would increase his risk for pulmonary complications, going back to ICU, prolonged hospital stay, etc.      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 Has seen Gen Surgery, Dr. Tally Due at [facility] for this. Denies obstructive symptoms. 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 Exam as noted above. Will monitor but suspect can f/u as outpatient for this   - Per CCM -  Hx of CAD s/p stents x 3 on Plavix (last dose 03/01/22 per chart) COPD on 3-5L o2 at night DM2 HTN HLD GERD Incidental finding - per Radiology there is a 4.4 x 1.0 cm focus of soft tissue density in the omentum/peritoneum of the anterior midline abdominal wall. They recommended follow-up CT in 3 months.   LOS: 5 days    [facility] 03/07/2022, 8:00  AM Please see Amion for pager number during day hours 7:00am-4:30pm

## 2022-03-07 NOTE — Progress Notes (Signed)
Mobility Specialist Progress Note:   03/07/22 1131  Mobility  Activity Ambulated with assistance in hallway  Level of Assistance Contact guard assist, steadying assist  Assistive Device Front wheel walker  Distance Ambulated (ft) 500 ft  Activity Response Tolerated well  $Mobility charge 1 Mobility   Pt received in chair and eager. No complaints. Pt left in chair with all needs met and call bell in reach.   Kaslyn [place] Mobility Specialist-Acute Rehab Secure Chat only

## 2022-03-07 NOTE — Progress Notes (Signed)
DISCHARGE NOTE HOME Herbert May to be discharged Home per MD order. Discussed prescriptions and follow up appointments with the patient. Prescriptions given to patient; medication list explained in detail. Patient verbalized understanding.  Skin clean, dry and intact without evidence of skin break down, no evidence of skin tears noted. IV catheter discontinued intact. Site without signs and symptoms of complications. Dressing and pressure applied. Pt denies pain at the site currently. No complaints noted.  Patient free of lines, drains, and wounds.   An After Visit Summary (AVS) was printed and given to the patient. Patient escorted via wheelchair, and discharged home via private auto.  Berneta Levins, RN

## 2022-03-09 LAB — CULTURE, BLOOD (ROUTINE X 2)
Culture: NO GROWTH
Culture: NO GROWTH
Special Requests: ADEQUATE

## 2022-03-31 ENCOUNTER — Telehealth: Payer: Self-pay

## 2022-03-31 NOTE — Telephone Encounter (Signed)
..     Pre-operative Risk Assessment    Patient Name: Herbert May  DOB: May 09, 1941 MRN: [id number]      Request for Surgical Clearance    Procedure:   LAPAROSCOPIC CHOLECYSTECTOMY  Date of Surgery:  Clearance TBD                                 Surgeon:  DR Michael Boston Surgeon's Group or Practice Name:  [facility] Phone number:  [phone] Fax number:  [phone]   Type of Clearance Requested:   - Medical  - Pharmacy:  Hold Clopidogrel (Plavix)     Type of Anesthesia:  General    Additional requests/questions:    Gwenlyn Found   03/31/2022, 12:49 PM

## 2022-03-31 NOTE — Telephone Encounter (Signed)
Primary Cardiologist:Suresh Bronson Ing, MD (Inactive)  Chart reviewed as part of pre-operative protocol coverage. Because of Herbert May's past medical history and time since last visit, he/she will require a follow-up visit in order to better assess preoperative cardiovascular risk.  Pre-op covering staff: - Please schedule appointment and call patient to inform them. - Please contact requesting surgeon's office via preferred method (i.e, phone, fax) to inform them of need for appointment prior to surgery.  History of remote stenting with most recent catheterization documented at 01/2007 which showed wide patency of the mid LAD stent and at that time he underwent successful stenting of the proximal LAD.  There was insignificant disease in the LCx and RCA. If no symptoms of ACS at time of office visit, per office protocol he may hold Plavix for 5 days prior to procedure and should resume as soon as hemodynamically stable post-op.  [name], NP-C  03/31/2022, 1:22 PM [address] Office [phone] Fax [phone]

## 2022-04-03 ENCOUNTER — Telehealth: Payer: Self-pay | Admitting: Pulmonary Disease

## 2022-04-03 NOTE — Telephone Encounter (Signed)
He needs ROV with me or an NP.

## 2022-04-03 NOTE — Telephone Encounter (Signed)
Fax received from Dr. Michael Boston with [facility] to perform a Laparoscopic Cholecystectomy on patient.  Patient needs surgery clearance. Surgery is PENDING. Patient was seen on 09/05/2021. Office protocol is a risk assessment can be sent to surgeon if patient has been seen in 60 days or less.   Sending to Dr Halford Chessman for risk assessment or recommendations if patient needs to be seen in office prior to surgical procedure.    Patient has office appointment with Dr Halford Chessman on 05/25/2022 for surgical clearance

## 2022-04-03 NOTE — Telephone Encounter (Signed)
I s/w the pt and he is agreeable to in office appt for pre op clearance. However, pt states he needs to give a 3 days notice for transportation. Pt then states though that his transportation only brings him to the appts and not back home, so he needs help getting back home after appt. I assured the pt that I will send a message to our transportation team to see if they can help arrange an transportation after appt. Pt said he will then schedule appt after he knows he will have transportation to get home  I will forward this note to Raquel Sarna to see if she may be able to help.

## 2022-04-06 ENCOUNTER — Telehealth: Payer: Self-pay | Admitting: Licensed Clinical Social Worker

## 2022-04-06 NOTE — Telephone Encounter (Signed)
CSW contacted the patient to follow up on transportation request. Patient states that his wife was admitted to the hospital and will hold off on rescheduling his appointment until he knows a plan for his wife. CSW provided information on transportation assistance and contact number when needed. Please feel to reach out to me when appointment scheduled for further assistance. Raquel Sarna, [place], [place]

## 2022-04-08 ENCOUNTER — Ambulatory Visit: Payer: Medicare Other | Admitting: Urology

## 2022-04-08 NOTE — Progress Notes (Deleted)
Assessment: 1. Urinary urgency      Plan: I personally reviewed the patient's records from Dr. Marlon Pel.  I also reviewed prior urology notes from Dr. Redmond Pulling.  I reviewed the CT results from 9/23 as well.  Chief Complaint: No chief complaint on file.   History of Present Illness:  Herbert May is a 81 y.o. male who is seen in consultation from Leonie Douglas, MD for evaluation of lower urinary tract symptoms.  He has a history of urinary urgency and urge incontinence. His symptoms have been present for several years.  He has previously seen Dr. Harrison Mons with [facility] urology in July 2020.  He previously tried oxybutynin without benefit.  He was given a trial of Vesicare 5 mg daily and restarted on tamsulosin at that time.  PVR = 103 mL.  PSA from 7/20: 1.2  CT abdomen and pelvis with and without contrast from 9/23 showed 3 nonobstructing stones in the right kidney measuring up to 10 mm in the lower pole, bilateral renal cysts, no evidence of obstruction, and normal appearing bladder.  Past Medical History:  Past Medical History:  Diagnosis Date   Allergy    Arthritis    Asthma    CAD (coronary artery disease) of artery bypass graft 12/27/2012   Colostomy status (HCC)    COPD (chronic obstructive pulmonary disease) (HCC)    Diabetes mellitus without complication (HCC)    Emphysema of lung (HCC)    GERD (gastroesophageal reflux disease)    Heart attack ([place])    Hyperlipidemia    Hypertension    Oxygen deficiency     Past Surgical History:  Past Surgical History:  Procedure Laterality Date   BACK SURGERY     cardiac stents     COLOSTOMY     COLOSTOMY CLOSURE     ENDOSCOPIC RETROGRADE CHOLANGIOPANCREATOGRAPHY (ERCP) WITH PROPOFOL N/A 03/05/2022   Procedure: ENDOSCOPIC RETROGRADE CHOLANGIOPANCREATOGRAPHY (ERCP) WITH PROPOFOL;  Surgeon: Ladene Artist, MD;  Location: [place];  Service: Gastroenterology;  Laterality: N/A;   HERNIA REPAIR     KNEE SURGERY      NOSE SURGERY     REMOVAL OF STONES  03/05/2022   Procedure: REMOVAL OF STONES;  Surgeon: Ladene Artist, MD;  Location: [place];  Service: Gastroenterology;;   Joan Mayans  03/05/2022   Procedure: SPHINCTEROTOMY;  Surgeon: Ladene Artist, MD;  Location: [facility] ENDOSCOPY;  Service: Gastroenterology;;    Allergies:  Allergies  Allergen Reactions   Aspirin Hives and Itching   Fish Allergy Nausea And Vomiting   Onion Nausea And Vomiting   Other Other (See Comments)    Just doesn't like [place]    Tape Other (See Comments)    Skin very sensitive, easily tears   Levaquin [Levofloxacin In D5w] Rash   Neosporin [Neomycin-Bacitracin Zn-Polymyx] Rash    Family History:  Family History  Problem Relation Age of Onset   Cancer Mother    Diabetes Mother    COPD Mother    Cancer Sister        brain   Cancer Sister        brain    Social History:  Social History   Tobacco Use   Smoking status: Former    Packs/day: 1.00    Years: 37.00    Total pack years: 37.00    Types: Cigarettes    Start date: 12/30/1953    Quit date: 12/28/1990    Years since quitting: 31.2   Smokeless tobacco: Former  Quit date: 06/30/1983  Vaping Use   Vaping Use: Never used  Substance Use Topics   Alcohol use: Yes    Alcohol/week: 0.0 standard drinks of alcohol    Comment: occasional   Drug use: No    Review of symptoms:  Constitutional:  Negative for unexplained weight loss, night sweats, fever, chills ENT:  Negative for nose bleeds, sinus pain, painful swallowing CV:  Negative for chest pain, shortness of breath, exercise intolerance, palpitations, loss of consciousness Resp:  Negative for cough, wheezing, shortness of breath GI:  Negative for nausea, vomiting, diarrhea, bloody stools GU:  Positives noted in HPI; otherwise negative for gross hematuria, dysuria, urinary incontinence Neuro:  Negative for seizures, poor balance, limb weakness, slurred speech Psych:  Negative for lack of energy,  depression, anxiety Endocrine:  Negative for polydipsia, polyuria, symptoms of hypoglycemia (dizziness, hunger, sweating) Hematologic:  Negative for anemia, purpura, petechia, prolonged or excessive bleeding, use of anticoagulants  Allergic:  Negative for difficulty breathing or choking as a result of exposure to anything; no shellfish allergy; no allergic response (rash/itch) to materials, foods  Physical exam: There were no vitals taken for this visit. GENERAL APPEARANCE:  Well appearing, well developed, well nourished, NAD HEENT: Atraumatic, Normocephalic, oropharynx clear. NECK: Supple without lymphadenopathy or thyromegaly. LUNGS: Clear to auscultation bilaterally. HEART: Regular Rate and Rhythm without murmurs, gallops, or rubs. ABDOMEN: Soft, non-tender, No Masses. EXTREMITIES: Moves all extremities well.  Without clubbing, cyanosis, or edema. NEUROLOGIC:  Alert and oriented x 3, normal gait, CN II-XII grossly intact.  MENTAL STATUS:  Appropriate. BACK:  Non-tender to palpation.  No CVAT SKIN:  Warm, dry and intact.   GU: Penis:  {Exam; penis:5791} Meatus: {Meatus:15530} Scrotum: {pe scrotum:310183} Testis: {Exam; testicles:5790} Epididymis: {epididymis TKWI:097353} Prostate: {Exam; prostate:5793} Rectum: {rectal exam:26517}   Results: U/A:  PVR:

## 2022-05-25 ENCOUNTER — Ambulatory Visit (INDEPENDENT_AMBULATORY_CARE_PROVIDER_SITE_OTHER): Payer: Medicare Other | Admitting: Pulmonary Disease

## 2022-05-25 ENCOUNTER — Encounter: Payer: Self-pay | Admitting: Pulmonary Disease

## 2022-05-25 VITALS — BP 132/76 | HR 84 | Temp 97.7°F | Ht 67.0 in | Wt 190.2 lb

## 2022-05-25 DIAGNOSIS — J449 Chronic obstructive pulmonary disease, unspecified: Secondary | ICD-10-CM | POA: Diagnosis not present

## 2022-05-25 DIAGNOSIS — J9611 Chronic respiratory failure with hypoxia: Secondary | ICD-10-CM

## 2022-05-25 DIAGNOSIS — Z23 Encounter for immunization: Secondary | ICD-10-CM

## 2022-05-25 DIAGNOSIS — Z01811 Encounter for preprocedural respiratory examination: Secondary | ICD-10-CM

## 2022-05-25 NOTE — Progress Notes (Signed)
[facility]  Chief Complaint: Chief Complaint  Patient presents with   Follow-up    Surgical clearance for cholecystectomy Would like flu shot today     Description:  81 yo male former smoker with COPD and chronic hypoxic respiratory failure.  History: He was seen recently by Dr. Johney Maine with surgery.  He is being scheduled for laparoscopic cholecystectomy.  He doesn't smoke anymore.  He has occasional cough with clear sputum.  Not having wheeze, chest pain, or hemoptysis.  Doesn't feel like his breathing limits his activity.  Uses albuterol 4 or 5 times per week.  He tried breztri and trelegy before, but neither of these seemed to help.  He doesn't feel like he needs additional inhaler therapy at this time.  He uses 4 liters oxygen at night when asleep.  Past medical history: He  has a past medical history of Allergy, Arthritis, Asthma, CAD (coronary artery disease) of artery bypass graft (12/27/2012), Colostomy status ([place]), COPD (chronic obstructive pulmonary disease) ([place]), Diabetes mellitus without complication ([place]), Emphysema of lung ([place]), GERD (gastroesophageal reflux disease), Heart attack ([place]), Hyperlipidemia, Hypertension, and Oxygen deficiency.  Past surgical history: He  has a past surgical history that includes Colostomy; Nose surgery; Back surgery; Hernia repair; Knee surgery; cardiac stents; Colostomy closure; Endoscopic retrograde cholangiopancreatography (ercp) with propofol (N/A, 03/05/2022); sphincterotomy (03/05/2022); and removal of stones (03/05/2022).  Social history: His  reports that he quit smoking about 31 years ago. His smoking use included cigarettes. He started smoking about 68 years ago. He has a 37.00 pack-year smoking history. He quit smokeless tobacco use about 38 years ago. He reports current alcohol use. He reports that he does not use drugs.  Vital signs: BP 132/76   Pulse 84   Temp 97.7 F (36.5 C)   Ht '5\' 7"'$  (1.702 m)   Wt 190  lb 3.2 oz (86.3 kg)   SpO2 94% Comment: ra  BMI 29.79 kg/m   Physical exam:  Appearance - well kempt   ENMT - no sinus tenderness, no oral exudate, no LAN, Mallampati 3 airway, no stridor  Respiratory - equal breath sounds bilaterally, no wheezing or rales  CV - s1s2 regular rate and rhythm, 2/6 SM  Ext - no clubbing, no edema  Skin - no rashes  Psych - normal mood and affect   Studies: PFT 10/30/16 >> FEV1 1.63 (61%), FEV1% 56, TLC 6.18 (95%), DLCO 70% Echo 03/02/22 >> EF 60 to 65%, mod LVH, grade 1 DD, mild/mod AS  Assessment/plan:  COPD mixed type. - prn albuterol - high dose flu shot today  Chronic respiratory failure with hypoxia. - he uses 4 liters oxygen at night  Coronary artery disease, Aortic stenosis. - he will schedule follow up with Dr. Fransico Him  Chronic cholecystitis. - he is followed by Dr. Michael Boston - there are no pulmonary contraindications that would prevent him from having surgery - he will need close monitoring of his oxygen level after surgery and continue inhaler therapy as needed   Patient Instructions  High dose flu shot today  Okay to proceed with surgery by Dr. Johney Maine  Follow up with Dr. Halford Chessman in 6 months   Allergies as of 05/25/2022       Reactions   Aspirin Hives, Itching   Fish Allergy Nausea And Vomiting   Onion Nausea And Vomiting   Other Other (See Comments)   Just doesn't like [place]    Tape Other (See Comments)   Skin very  sensitive, easily tears   Levaquin [levofloxacin In D5w] Rash   Neosporin [neomycin-bacitracin Zn-polymyx] Rash        Medication List        Accurate as of May 25, 2022 10:27 AM. If you have any questions, ask your nurse or doctor.          STOP taking these medications    clopidogrel 75 MG tablet Commonly known as: PLAVIX Stopped by: Chesley Mires, MD   docusate sodium 100 MG capsule Commonly known as: COLACE Stopped by: Chesley Mires, MD   fentaNYL 100 MCG/HR Commonly  known as: DURAGESIC Stopped by: Chesley Mires, MD       TAKE these medications    acyclovir 800 MG tablet Commonly known as: ZOVIRAX Take 800 mg by mouth 5 (five) times daily.   albuterol 108 (90 Base) MCG/ACT inhaler Commonly known as: VENTOLIN HFA Inhale 2 puffs into the lungs every 6 (six) hours as needed for wheezing or shortness of breath. What changed: Another medication with the same name was changed. Make sure you understand how and when to take each.   albuterol (2.5 MG/3ML) 0.083% nebulizer solution Commonly known as: PROVENTIL INHALE CONTENTS OF 1 VIAL IN NEBULIZER EVERY 6 HOURS AS NEEDED FOR WHEEZING OR SHORTNESS OF BREATH. What changed: See the new instructions.   fluticasone 50 MCG/ACT nasal spray Commonly known as: FLONASE Place 2 sprays into both nostrils daily.   gabapentin 100 MG capsule Commonly known as: NEURONTIN Take 200 mg by mouth 2 (two) times daily.   lidocaine 5 % Commonly known as: LIDODERM Place 1 patch onto the skin daily as needed (pain).   metFORMIN 1000 MG tablet Commonly known as: GLUCOPHAGE Take 1,000 mg by mouth at bedtime.   methocarbamol 500 MG tablet Commonly known as: ROBAXIN Take 500 mg by mouth at bedtime as needed for muscle spasms.   metoprolol succinate 25 MG 24 hr tablet Commonly known as: TOPROL-XL Take by mouth.   nitroGLYCERIN 0.4 MG SL tablet Commonly known as: NITROSTAT Place 0.4 mg under the tongue every 5 (five) minutes as needed for chest pain.   oxybutynin 10 MG 24 hr tablet Commonly known as: DITROPAN-XL Take 10 mg by mouth at bedtime.   oxyCODONE-acetaminophen 10-325 MG tablet Commonly known as: PERCOCET Take 1 tablet by mouth every 6 (six) hours as needed for pain.   pantoprazole 40 MG tablet Commonly known as: PROTONIX Take 1 tablet (40 mg total) by mouth 2 (two) times daily. What changed: when to take this   potassium chloride SA 20 MEQ tablet Commonly known as: KLOR-CON M Take 20 mEq by mouth  daily.   pravastatin 40 MG tablet Commonly known as: PRAVACHOL Take 40 mg by mouth at bedtime.   sodium chloride 0.9 % Inject 2,436 mLs into the vein See admin instructions. Sodium Chloride 0.9% bolus, 2436 ml over 1 hour for 1 dose.   tamsulosin 0.4 MG Caps capsule Commonly known as: FLOMAX Take 0.4 mg by mouth at bedtime.   Tiadylt ER 120 MG 24 hr capsule Generic drug: diltiazem Take 120 mg by mouth daily.   traZODone 100 MG tablet Commonly known as: DESYREL Take 100 mg by mouth at bedtime.   VITAMIN B-12 PO Take 1 tablet by mouth daily.        Time spent: 36 minutes  Signature: Chesley Mires, MD [place] Pager - [phone] 05/25/2022, 10:27 AM

## 2022-05-25 NOTE — Telephone Encounter (Signed)
OV notes and clearance form have been faxed back to [facility]. Nothing further needed at this time.

## 2022-05-25 NOTE — Patient Instructions (Signed)
High dose flu shot today  Okay to proceed with surgery by Dr. Johney Maine  Follow up with Dr. Halford Chessman in 6 months

## 2023-01-08 ENCOUNTER — Ambulatory Visit: Payer: 59 | Admitting: Pulmonary Disease

## 2023-12-01 ENCOUNTER — Ambulatory Visit: Payer: Self-pay

## 2023-12-01 NOTE — Telephone Encounter (Signed)
 FYI Only or Action Required?: Action required by provider  Patient is followed in Pulmonology for COPD, last seen on 05/25/2022 by Wilder Handy, MD. Called Nurse Triage reporting No chief complaint on file.. Symptoms began several months ago. Interventions attempted: Nothing. Symptoms are: gradually worsening.  Triage Disposition: See PCP When Office is Open (Within 3 Days)  Patient/caregiver understands and will follow disposition?: Yes  Copied from CRM [id number]. Topic: Clinical - Red Word Triage >> Dec 01, 2023 12:58 PM Crist Dominion wrote: Red Word that prompted transfer to Nurse Triage: Shortness of breath  Patient requests that medication is called in if possible. Please contact the patient at [phone]  Reason for Disposition  [1] MODERATE longstanding difficulty breathing (e.g., speaks in phrases, SOB even at rest, pulse 100-120) AND [2] SAME as normal  Answer Assessment - Initial Assessment Questions 1. RESPIRATORY STATUS: "Describe your breathing?" (e.g., wheezing, shortness of breath, unable to speak, severe coughing)      Dyspnea with rest and exertion  2. ONSET: "When did this breathing problem begin?"      Chronic  3. PATTERN "Does the difficult breathing come and go, or has it been constant since it started?"      Comes and goes  4. SEVERITY: "How bad is your breathing?" (e.g., mild, moderate, severe)    - MILD: No SOB at rest, mild SOB with walking, speaks normally in sentences, can lie down, no retractions, pulse < 100.    - MODERATE: SOB at rest, SOB with minimal exertion and prefers to sit, cannot lie down flat, speaks in phrases, mild retractions, audible wheezing, pulse 100-120.    - SEVERE: Very SOB at rest, speaks in single words, struggling to breathe, sitting hunched forward, retractions, pulse > 120      Moderate  5. RECURRENT SYMPTOM: "Have you had difficulty breathing before?" If Yes, ask: "When was the last time?" and "What happened that time?"       Yes  6. CARDIAC HISTORY: "Do you have any history of heart disease?" (e.g., heart attack, angina, bypass surgery, angioplasty)      Heart Attack x 2  7. LUNG HISTORY: "Do you have any history of lung disease?"  (e.g., pulmonary embolus, asthma, emphysema)     COPD  8. CAUSE: "What do you think is causing the breathing problem?"      Unsure  9. OTHER SYMPTOMS: "Do you have any other symptoms? (e.g., dizziness, runny nose, cough, chest pain, fever)     Cough  10. O2 SATURATION MONITOR:  "Do you use an oxygen  saturation monitor (pulse oximeter) at home?" If Yes, ask: "What is your reading (oxygen  level) today?" "What is your usual oxygen  saturation reading?" (e.g., 95%)       96  12. TRAVEL: "Have you traveled out of the country in the last month?" (e.g., travel history, exposures)       No  Protocols used: Breathing Difficulty-A-AH

## 2023-12-01 NOTE — Telephone Encounter (Signed)
 Pt is scheduled next available appt 12/15/23 with Dr. Diania Fortes.   I called the pt and advised that if he feels that he is unable to wait until this visit he needs to see PCP or go to UC  He verbalized understanding   Nothing further needed

## 2023-12-15 ENCOUNTER — Ambulatory Visit: Admitting: Pulmonary Disease

## 2024-01-23 NOTE — Progress Notes (Deleted)
 Subjective:   Patient ID: Herbert May, male    DOB: 24-May-1941,   MRN: [id number]    Brief patient profile:  44 yowm MM/quit smoking in 1992 with breathing problems then that worsened over the years esp since 2011 lived in [place]  and moved to [place] in  2013 on 02 since early 2016 at hs only with GOLD II criteria 11/09/16    History of Present Illness  01/24/2015 1st [facility] office visit/ Abas Leicht  On flovent / spiriva   Chief Complaint  Patient presents with   Pulmonary Consult    Self referral. Pt states has had SOB all of my life- worse x 3 months. He states he is SOB all of the time, with or without exertion. He also c/o cough- prod with yellow sputum.    gradually worse doe x 4-5 years but can still do [employer] slowly but c/o sense of sob at rest assoc with congested cough and worse symptoms walking in hot humid conditions not much better with saba but doesn't really understand how to use them  rec Ok to use the combination of albuterol /ipatriprium up to 4 x daily  Only use your albuterol  as a rescue medication pantoprazole  40  Mg Take 30- 60 min before your first and last meals of the day  GERD diet   07/31/16 NP  Pt presents for an acute office visit. Complains of persistent cough, congestion , dyspnea and wheezing . Worse for last 3-4 weeks .  Recently admitted 2 weeks ago at  [facility] for COPD flare , tx w/ abx and steroids . No discharge summary at todays visit. Request sent to hospital . Since got out of hospital he is not feeling much better. Still has cough/congestion and wheezing . Seen by PCP yesterday and started Doxycycline . Coughing up thick green mucus.  No chest pain or hemoptysis. Appetitie is down. No n/v/d.  Last seen in pulmonary clinic in 2016.  Previously on Symbicort  and Spiriva  in past but insurance did not cover .  On Duoneb Four times a day  . Insurance covers this well.  rec Finish Doxycycline .  Mucinex  DM Twice daily  As needed   Cough/congestion  Continue on Duoneb Four times a day  .  Begin Oxygen  2l/m with activity  Order to DME for portable concentrator.  Begin BREO 1 puff daily .     10/30/2016  f/u ov/Danyeal Akens re:  GOLD II copd / breo maint / overuse of saba noted / 02 2lpm  hs and prn with ambulation  Chief Complaint  Patient presents with   Follow-up    PFT's done today. Breathing is unchanged. He is using proair  3-4 x per day and albuterol  neb 4 x daily.   doe = MMRC2 usually s 20 rec Plan A = Automatic = try off the BREO  And replace it with Trelogy one click each am - take 2 long drags and hold 5 seconds each  Plan B = Backup Only use your albuterol  (Proair )  as a rescue medication  Work on inhaler technique:    Plan C = Crisis - only use your albuterol /ipatropium nebulizer if you first try Plan B    ER in [place]  And Nov 2019 for copd / fast heart rate > last in  ER 05/04/18 rec pred x 5 day    05/13/2018 acute extended ov/Daily Crate re: re-establish for COPD GOLD II / sob/cough some better p last ER trip / now maint on Trelegy  Chief Complaint  Patient presents with   Acute Visit    Pt c/o increased SOB and cough for the past few months. His cough is prod with green sputum.  He is SOB with minimal exertion and also when he lies down. He is using his proair  and albuterol  neb each 4 x daily on average.   doe =  MMRC3 = can't walk 100 yards even at a slow pace at a flat grade s stopping due to sob, still walks to mb most days stops 4-5 x on way back  Cough  First thing am / green and thick x up to 15 min to clear it  Sleep R side down 2 pillows/ bed flat  SABA: way over using at baseline  rec Plan A = Automatic =  Continue Trelogy one click each am - take 2 long drags and hold 5 seconds each  Plan B = Backup Only use your albuterol  (Proair )   Work on inhaler technique  Plan C = Crisis - only use your albuterol  nebulizer if you first try Plan B and it fails to help > ok to use the nebulizer up to every  4 hours but if start needing it regularly call for immediate appointment Protonix  (pantoprazole  ) should  Be Take 30- 60 min before your first and last meals of the day  GERD diet   Augmentin  875 mg take one pill twice daily  X 10 days - take at breakfast and supper with large glass of water.  It would help reduce the usual side effects (diarrhea and yeast infections) if you ate cultured yogurt at lunch.  For cough > mucinex  or mucinex  dm up to 1200 mg every 12 hours as needed and use the flutter valve as much as you can  Depomedrol 120 mg IM today     04/30/18  ONO on RA was pos for desat (done by [facility] 05/21/18) Per MW- start 2lpm o2 with sleep   06/10/2018  f/u ov/Lorayne Getchell re: copd GOLD II/ cb features/ poor insight into how/ when to use hfa vs neb - did not bring neb med but did bring all others and miant on  trelegy  Chief Complaint  Patient presents with   Follow-up    Breathing has improved some. He is using his albuterol  inhaler and neb both 2 x daily.   Dyspnea:  MMRC 3  Cough: variable denies worse in am Sleeping: fine on side on 2lpm/ did not use bed blocks as rec  SABA use: as above, still misunderstanding how to use hfa vs neb but  02: 2lpm hs only  rec  Protonix  is supposed to be Take 30- 60 min before your first and last meals of the day  For cough > mucinex  or mucinex  dm up to 1200 mg every 12 hours as needed and use the flutter valve as much as you can    NP rec 07/04/18  Mucinex  twice a day with full glass of water Use flutter valve 3-4 times a day Albuterol  nebulizer every 4-6 hours as needed for shortness of breath or wheezing Continue Trelegy 1 puff daily  CXR and labs today Please qualify for POC      07/11/2018  f/u ov/Nickie Warwick re:   COPD gold II/ maint trelegy -  Qualified for amb 02 and chronically on 2lpm hs but has not received portable system yet Chief Complaint  Patient presents with   Follow-up    Breathing is some better since the  last visit. He is  using his albuterol  inhaler and neb both several times per day.   Dyspnea:   able to do [employer] shopping easier than before, thinks trelegy is why even though not using 02 yet as rec  Cough: minimal mucoid in am's/ using flutter Sleeping: flat bed, on side/ 2 pillows  SABA use: as above 02: hs 2lpm, does not have portable system  rec We will see if we can troubleshoot why you don't have a portable system but only needs to be used with walking outside of house  No change in medications    11/09/2018  f/u ov/Elvie Palomo re:  Copd II/ maint trelegy/ 02 2lpm only Chief Complaint  Patient presents with   Follow-up    Breathing is about the same. He is using his proair  and neb with albuterol  both 2-3 x per day.  Dyspnea:  Gets to shop at [place]/ gets dropped off at door / not using 02 - slow walk to [place] and back hilly Cough: no Sleeping: on side / bed is flat , 2 pillows  SABA use: up to twice daily when overdoes it  02: 2lpm at hs /not using during the day  rec Titrate sats    06/05/2019  f/u ov/Laneice Meneely re:  Copd II / maint on trelegy  Chief Complaint  Patient presents with   Follow-up    pt states he is stable, denies any increased sob, CP, cough, mucus production.    Dyspnea:  Shopping at [employer] at baseline  Cough: no  Sleeping: on back 2 pillows or couch  SABA use: still using lots of saba/ poor insight how/ when to use  02: 4lpm hs,  None at rest,  4lpm when walking  rec Plan A = Automatic = Always=    Trelegy once a daily  Plan B = Backup (to supplement plan A, not to replace it) Only use your albuterol  inhaler as a rescue medication   Plan C = Crisis (instead of Plan B but only if Plan B stops working) - only use your albuterol  nebulizer if you first try Plan B and it fails to help > ok to use the nebulizer up to every 4 hours but if start needing it regularly call for immediate appointment      12/13/2019  preop consult /Yobani Schertzer re: GOLD  II / maint on trelegy /  Lots of saba and  confused with maint vs prns For hernia surgery in [place] by Fonda Pine but could not recall his or his pcp's name  Chief Complaint  Patient presents with   Follow-up    Breathing is overall doing well. He needs pulmonary clearance for hernia surgery. He is using his albuterol  inhaler and neb both about 2 x per day.   Dyspnea:  MMRC2 = can't walk a nl pace on a flat grade s sob but does fine slow and flat shopping at [employer] or [employer] with cane  Cough: not much lately Sleeping: flat bed, 2 pillows / no nocturnal resp cc's SABA use: denies pre challenging / using a lot of saba more so since stopped trelegy when ran out'  02: just 2lpm hs only rec Can try Breztri  two puffs in the morning and two puffs in the evening; will give you 2 samples to try Will have [facility] check on your home oxygen  set up Follow up in 4 weeks with Dr. Darlean    07/25/2020  f/u ov/Sammye Staff re: re GOLD II/ breztri   better than prior rx  Chief Complaint  Patient presents with   Followup     Breathing has improved back to baseline since last visit. He has occ cough with clear sputum. He is using the albuterol  inhaler 3-4 x per wk and neb with albuterol  2 x per day.   Dyspnea:  Can do [employer] or [employer]/ walks across parking lot not [facility] parking  Cough: min mucoid  Sleeping: recliner 45 degrees, wife smokes in bedroom so he stays out  SABA use: as above  02: 5lpm 24/7 rec No change in medications - there are no restrictions for pain medications prescibed by your pain doctor  needed based on your breathing which is doing better Make sure you check your oxygen  saturation  at your highest level of activity  to be sure it stays over 90% and adjust  02 flow upward to maintain this level if needed but remember to turn it back to previous settings when you stop (to conserve your supply).   PC 07/31/20  He confirmed both and stated that he is doing good with the Breztri . RX has been sent  [facility] 09/02/20 cc Breztri  not  woring  did not follow recs for rescue rx advised if not convinced breztri  helping  He has nebulized albuterol  he can use up to every 4 hours if doing worse and then come to office next available slot to see me or another provider with all meds in hand to regroup  - nothing else to offer over the phone, to er if get worse on neb awaiting ov  09/05/2020  f/u ov/[place] [facility]/Jac Romulus re:  Aecopd/ did not bring meds Chief Complaint  Patient presents with   Follow-up    Pt states breathing was worse while on breztri  so he stopped and is using his albuterol  inhaler and neb both about every 6 hours. Breathing is overall doing well today.   Dyspnea:  Walks aisles at [employer] when doing better sometimes s desats on RA  Cough: congested / white mucus esp in am/ has flutter valve ? Using  Sleeping: recliner at 45 degrees  SABA use: way too much at baseline/thoroughly confused again  02: 4lpm hs and prn daytime but not understanding basic concepts that the more he walks the  More he should use his 02  Covid status: vax x 3  Rec Ok to leave off breztri  since you don't think it helped Make sure you check your oxygen  saturation  at your highest level of activity  to be sure it stays over 90% Plan A = Automatic = Always=    Albuterol  / ipatropium 4 x daily  Plan B = Backup (to supplement plan A, not to replace it) Only use your albuterol  inhaler as a rescue medication Plan C = Crisis (instead of Plan B but only if Plan B stops working) - only use your albuterol  nebulizer if you first try Plan B  Plan D = Deltasone  = prednisone   If ABC not working then add Prednisone  10 mg take  4 each am x 2 days,   2 each am x 2 days,  1 each am x 2 days and stop (it is refillable)  For cough > mucinex  dm up to 1200 mg every 12 hours and use the flutter valve as much as possible    Return to see Jude or Sood with all medications in hand to regroup re long term treatment options       01/24/2024  f/u  ov/[place] office/Kimberlee Shoun re: GOLD 2 copd/ 02 dep  maint on ***  No chief complaint on file.   Dyspnea:  *** Cough: *** Sleeping: ***   resp cc  SABA use: *** 02: ***  Lung cancer screening: ***   No obvious day to day or daytime variability or assoc excess/ purulent sputum or mucus plugs or hemoptysis or cp or chest tightness, subjective wheeze or overt sinus or hb symptoms.    Also denies any obvious fluctuation of symptoms with weather or environmental changes or other aggravating or alleviating factors except as outlined above   No unusual exposure hx or h/o childhood pna/ asthma or knowledge of premature birth.  Current Allergies, Complete Past Medical History, Past Surgical History, Family History, and Social History were reviewed in [employer] record.  ROS  The following are not active complaints unless bolded Hoarseness, sore throat, dysphagia, dental problems, itching, sneezing,  nasal congestion or discharge of excess mucus or purulent secretions, ear ache,   fever, chills, sweats, unintended wt loss or wt gain, classically pleuritic or exertional cp,  orthopnea pnd or arm/hand swelling  or leg swelling, presyncope, palpitations, abdominal pain, anorexia, nausea, vomiting, diarrhea  or change in bowel habits or change in bladder habits, change in stools or change in urine, dysuria, hematuria,  rash, arthralgias, visual complaints, headache, numbness, weakness or ataxia or problems with walking or coordination,  change in mood or  memory.        No outpatient medications have been marked as taking for the 01/24/24 encounter (Appointment) with Darlean Ozell NOVAK, MD.                                 Objective:   Physical Exam  01/24/2024      ***   09/05/2020     211 07/25/2020      208 12/13/2019      209 06/05/2019      198  11/09/2018       213  07/11/2018       205 06/10/2018     214  05/13/2018     211 10/30/2016         205   09/11/16 210  lb 3.2 oz (95.3 kg)  07/31/16 206 lb 9.6 oz (93.7 kg)  03/19/16 215 lb (97.5 kg)    Vital signs reviewed  01/24/2024  - Note at rest 02 sats  ***% on ***   General appearance:    ***    Mod barr  trace edema L >R pitting / elastic hose in place***                  Assessment & Plan:

## 2024-01-24 ENCOUNTER — Ambulatory Visit: Admitting: Internal Medicine

## 2024-01-24 ENCOUNTER — Encounter: Payer: Self-pay | Admitting: Internal Medicine

## 2024-01-24 DIAGNOSIS — J449 Chronic obstructive pulmonary disease, unspecified: Secondary | ICD-10-CM

## 2024-06-14 ENCOUNTER — Encounter: Payer: Self-pay | Admitting: General Practice
